# Patient Record
Sex: Male | Born: 1990 | Race: White | Hispanic: No | Marital: Single | Smoking: Current every day smoker
Health system: Southern US, Community
[De-identification: ages and names within clinical notes are randomized; demographics above are authoritative.]

## PROBLEM LIST (undated history)

## (undated) HISTORY — PX: WRIST SURGERY: SHX841

---
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
## 2010-02-01 ENCOUNTER — Encounter: Payer: Self-pay | Admitting: Family Medicine

## 2010-05-09 ENCOUNTER — Emergency Department (HOSPITAL_COMMUNITY): Payer: Worker's Compensation

## 2010-05-09 ENCOUNTER — Emergency Department (HOSPITAL_COMMUNITY)
Admission: EM | Admit: 2010-05-09 | Discharge: 2010-05-09 | Disposition: A | Discharge status: Home / Self Care | Payer: Worker's Compensation | Attending: Emergency Medicine | Admitting: Emergency Medicine

## 2010-05-09 DIAGNOSIS — S90129A Contusion of unspecified lesser toe(s) without damage to nail, initial encounter: Secondary | ICD-10-CM | POA: Insufficient documentation

## 2010-05-09 DIAGNOSIS — Y9229 Other specified public building as the place of occurrence of the external cause: Secondary | ICD-10-CM | POA: Insufficient documentation

## 2010-05-09 DIAGNOSIS — W208XXA Other cause of strike by thrown, projected or falling object, initial encounter: Secondary | ICD-10-CM | POA: Insufficient documentation

## 2010-05-09 DIAGNOSIS — Y990 Civilian activity done for income or pay: Secondary | ICD-10-CM | POA: Insufficient documentation

## 2010-05-29 NOTE — H&P (Signed)
[facility]  Patient:    BOSTEN, NEWSTROM. Visit Number: [id number] MRN: [id number]          Service Type: Dictated by:   Candace Cruise, P.A.-C. Adm. Date:  10/24/00                           History and Physical  CHIEF COMPLAINT:  Fractured right distal radius and ulna.  HISTORY OF PRESENT ILLNESS:  The patient is a 20 year old white male who is to be admitted to day surgery on October 24, 2000, to undergo closed reduction and manipulation right distal radial fracture by Dr. Romeo Apple.  He was initially seen in the emergency room on October 20, 2000, by Dr. Brooke Dare. He injured his right upper extremity.  Stated he was playing football, fell, and another player fell on his right wrist.  In doing so he suffered a displaced right distal radial and ulna fracture.  While in the emergency room closed reduction and manipulation was attempted under hematoma block, but this was unsuccessful.  Post-reduction x-rays show approximately a 30 degree angulation dorsally of the fracture.  At that time the mother was told the child would need to undergo closed reduction and manipulation of the fracture under anesthesia.  Because Dr. Brooke Dare is going to be out of town, it was recommended that Fuller Canada, his associate, do the procedure.  While in the office today, October 21, 2000, neurovascular was noted to be intact to his right hand and cast was in good position.  Surgery was again explained to the mother and followup arrangements also explained.  PAST MEDICAL HISTORY:  The patient has been in excellent health and denied any diabetes mellitus, hypertension, stroke, cardiac, or respiratory problems.  OPERATIONS:  None.  MEDICATIONS:  Tylenol No. 3 1-2 tablets q.4h. p.r.n. pain.  ALLERGIES:  No known drug allergies.  LOCAL PHYSICIAN:  [facility].  SOCIAL HISTORY:  The patient is a Consulting civil engineer.  He does not use alcohol or tobacco products.  REVIEW OF  SYSTEMS:  Negative.  PHYSICAL EXAMINATION:  VITAL SIGNS:  Temperature 98.6, respirations 18, pulse 86, and blood pressure 120/76.  GENERAL:  The patient is 5 feet 6 inches, weighs 101 pounds.  The patient is a think, alert, white male with a cast on his right upper extremity.  HEENT:  Within normal limits.  NECK:  Supple without thyromegaly or masses palpated.  LUNGS:  Clear to auscultation and percussion.  HEART:  Regular rhythm without murmur, no cardiomegaly.  ABDOMEN:  Flat, soft, nontender, and no organomegaly or masses palpated. Hypoactive bowel sounds auscultated.  EXTREMITIES:  Right upper extremity with swelling to the fingers, stiffness. Able, however, to fully extend his fingers.  Pulses intact.  Neurovascularly intact to the right dominant hand.  Other extremity is within normal limits, neurovascularly intact.  SKIN:  Intact.  NEUROLOGIC:  CNS intact.  IMPRESSION:  Displaced right distal radial fracture, failed closed reduction and manipulation under hematoma block, right distal radial fracture.  PLAN:  The patient is to be admitted to day surgery on October 24, 2000, to undergo closed reduction and manipulation of right distal radial fracture and application of cast.  The patient in the meantime has been asked to continue ice, elevation to his extremity.  Labs pending.Dictated by:   Candace Cruise, P.A.-C. DD:  10/21/00 TD:  10/21/00 Job: [id number]

## 2010-05-29 NOTE — Consult Note (Signed)
[facility]  Patient:    Johnathan Salinas, Johnathan Salinas Visit Number: [id number] MRN: [id number]          Service Type: EMS Location: ED Attending Physician:  Annamarie Dawley Admit Date:  10/20/2000 Discharge Date: 10/20/2000                            Consultation Report  HISTORY OF PRESENT ILLNESS:  The patient is a 20 year old white male playing organized league football.  He was tackled, fell, and hit his right wrist. Obvious deformity of the right wrist with pain.  X-ray revealed displaced fracture of the distal radius on the right.  No other injuries were sustained.  The patient is in full football attire including shoulder pads, cleats, and uniform.  No other pain.  The patient had an obvious silver fork deformity of his right distal radius.  Neurovascular was intact.  IMPRESSION:  Fracture displaced right distal radius.  Underwent 1% plain Xylocaine block after sterile prep and drape, right hip and tunnel block given.  Closed reduction was carried out and sugar tong splint applied.  We are awaiting the x-rays to be taken now.  The patient was given prescription for Tylenol No.3 with Codeine, sling, ice, and instructions to see me tomorrow at the office.  If any difficulty come back to the emergency room.  The patient information booklet has been given. Attending Physician:  Annamarie Dawley DD:  10/20/00 TD:  10/21/00 Job: [id number]

## 2010-05-29 NOTE — Op Note (Signed)
[facility]  Patient:    Johnathan Salinas, Johnathan Salinas Visit Number: [id number] MRN: [id number]          Service Type: DSU Location: DAY Attending Physician:  Fuller Canada Dictated by:   Fuller Canada, M.D. Admit Date:  10/24/2000                             Operative Report  INDICATIONS:  [age]-year-old male fractured his right distal radius.  Closed manipulation attempted in the emergency room without complete reduction of the fracture by Dr. Darreld Mclean.  Dr. Hilda Lias then transferred the patient to me in his absence for closed reduction under anesthesia.  PREOPERATIVE DIAGNOSIS:  Distal radius fracture, right wrist.  POSTOPERATIVE DIAGNOSIS:  Distal radius fracture, right wrist.  PROCEDURE:  Closed reduction, right distal radius, application of long-arm sugar-tong splint, right forearm.  SURGEON:  Fuller Canada, M.D.  ASSISTANT:  No assistants.  ANESTHESIA:  General.  ANESTHETIST:  ______ C.R.N.A.  DESCRIPTION OF PROCEDURE:  The patients right wrist was marked as the operative site.  Patient was taken to the operating room for general anesthesia.  After a brief period of traction, closed manipulation was performed in a standard manner and a radiograph showed that the fracture had been placed in acceptable alignment on AP and lateral x-ray.  Patient was placed in a sugar-tong splint, extubated and taken to the recovery room in stable condition.  FOLLOWUP:  Followup appointment was scheduled for two days for x-rays and cast check and patient is to take Tylenol with codeine elixir for pain one teaspoon every four hours as needed. Dictated by:   Fuller Canada, M.D. Attending Physician:  Fuller Canada DD:  10/24/00 TD:  10/24/00 Job: [id number]

## 2010-10-26 LAB — RAPID URINE DRUG SCREEN, HOSP PERFORMED
Cocaine: NOT DETECTED
Tetrahydrocannabinol: POSITIVE — AB

## 2012-08-06 ENCOUNTER — Emergency Department (HOSPITAL_COMMUNITY)
Admission: EM | Admit: 2012-08-06 | Discharge: 2012-08-06 | Disposition: A | Discharge status: Home / Self Care | Payer: Self-pay | Attending: Emergency Medicine | Admitting: Emergency Medicine

## 2012-08-06 ENCOUNTER — Encounter (HOSPITAL_COMMUNITY): Payer: Self-pay | Admitting: Emergency Medicine

## 2012-08-06 DIAGNOSIS — K089 Disorder of teeth and supporting structures, unspecified: Secondary | ICD-10-CM | POA: Insufficient documentation

## 2012-08-06 DIAGNOSIS — K047 Periapical abscess without sinus: Secondary | ICD-10-CM | POA: Insufficient documentation

## 2012-08-06 DIAGNOSIS — R599 Enlarged lymph nodes, unspecified: Secondary | ICD-10-CM | POA: Insufficient documentation

## 2012-08-06 MED ORDER — NAPROXEN 500 MG PO TABS: 500.0000 mg | ORAL_TABLET | Freq: Two times a day (BID) | ORAL | Status: AC

## 2012-08-06 MED ORDER — HYDROCODONE-ACETAMINOPHEN 5-325 MG PO TABS: 1.0000 | ORAL_TABLET | ORAL | Status: AC | PRN

## 2012-08-06 MED ORDER — AMOXICILLIN 500 MG PO CAPS: 500.0000 mg | ORAL_CAPSULE | Freq: Three times a day (TID) | ORAL | Status: AC

## 2012-08-06 NOTE — ED Provider Notes (Signed)
CSN: [id number]     Arrival date & time 08/06/12  1415 History     First MD Initiated Contact with Patient 08/06/12 1435     Chief Complaint  Patient presents with  . Oral Swelling   (Consider location/radiation/quality/duration/timing/severity/associated sxs/prior Treatment) HPI Johnathan Salinas is a 22 y.o. male who presents to the ED with dental pain. The pain is located in the upper right first molar. The pain started yesterday and is worse today with swelling of the gum around the tooth. Patient denies fever, chills, nausea, vomiting or other problems.  History reviewed. No pertinent past medical history. History reviewed. No pertinent past surgical history. No family history on file. History  Substance Use Topics  . Smoking status: Not on file  . Smokeless tobacco: Not on file  . Alcohol Use: Not on file    Review of Systems  Constitutional: Negative for fever and chills.  HENT: Positive for dental problem. Negative for neck pain.   Respiratory: Negative for cough.   Gastrointestinal: Negative for nausea and vomiting.  Skin: Negative for rash.  Neurological: Negative for headaches.  Psychiatric/Behavioral: The patient is not nervous/anxious.     Allergies  Review of patient's allergies indicates no known allergies.  Home Medications   Current Outpatient Rx  Name  Route  Sig  Dispense  Refill  . amoxicillin (AMOXIL) 500 MG capsule   Oral   Take 1 capsule (500 mg total) by mouth 3 (three) times daily.   21 capsule   0   . HYDROcodone-acetaminophen (NORCO/VICODIN) 5-325 MG per tablet   Oral   Take 1 tablet by mouth every 4 (four) hours as needed.   15 tablet   0   . naproxen (NAPROSYN) 500 MG tablet   Oral   Take 1 tablet (500 mg total) by mouth 2 (two) times daily.   30 tablet   0    BP 108/61  Pulse 73  Temp(Src) 98.7 F (37.1 C) (Oral)  Resp 16  Wt 145 lb (65.772 kg)  SpO2 100% Physical Exam  Nursing note and vitals reviewed. Constitutional: He  is oriented to person, place, and time. He appears well-developed and well-nourished. No distress.  HENT:  Head: Normocephalic.  Mouth/Throat: Uvula is midline, oropharynx is clear and moist and mucous membranes are normal. Dental abscesses present.    Swelling and tenderness of gum around the first upper right molar.  Eyes: EOM are normal.  Neck: Neck supple.  Cardiovascular: Normal rate and regular rhythm.   Pulmonary/Chest: Effort normal and breath sounds normal.  Abdominal: Soft. There is no tenderness.  Musculoskeletal: Normal range of motion.  Lymphadenopathy:    He has cervical adenopathy (right).  Neurological: He is alert and oriented to person, place, and time. No cranial nerve deficit.  Skin: Skin is warm and dry.  Psychiatric: He has a normal mood and affect. His behavior is normal.    ED Course   Procedures  1. Dental abscess     MDM  22 y.o. male with dental pain due to abscess. Will give information on dental clinic and treat with antibiotics and pain medication. He will return as needed.    Medication List         amoxicillin 500 MG capsule  Commonly known as:  AMOXIL  Take 1 capsule (500 mg total) by mouth 3 (three) times daily.     HYDROcodone-acetaminophen 5-325 MG per tablet  Commonly known as:  NORCO/VICODIN  Take 1 tablet by mouth  every 4 (four) hours as needed.     naproxen 500 MG tablet  Commonly known as:  NAPROSYN  Take 1 tablet (500 mg total) by mouth 2 (two) times daily.         [facility] Orlene Och, [place] 08/06/12 1443

## 2012-08-06 NOTE — ED Notes (Signed)
States that he chipped a tooth a while back and noticed that he started having swelling in his upper right gum last night, which was worse today.

## 2012-08-06 NOTE — ED Provider Notes (Signed)
Medical screening examination/treatment/procedure(s) were performed by non-physician practitioner and as supervising physician I was immediately available for consultation/collaboration.   Kalid Ghan, MD 08/06/12 1454 

## 2015-05-28 ENCOUNTER — Encounter (HOSPITAL_BASED_OUTPATIENT_CLINIC_OR_DEPARTMENT_OTHER): Payer: Self-pay

## 2015-05-28 ENCOUNTER — Emergency Department (HOSPITAL_BASED_OUTPATIENT_CLINIC_OR_DEPARTMENT_OTHER): Payer: Worker's Compensation

## 2015-05-28 ENCOUNTER — Emergency Department (HOSPITAL_BASED_OUTPATIENT_CLINIC_OR_DEPARTMENT_OTHER)
Admission: EM | Admit: 2015-05-28 | Discharge: 2015-05-28 | Disposition: A | Discharge status: Home / Self Care | Payer: Worker's Compensation | Attending: Emergency Medicine | Admitting: Emergency Medicine

## 2015-05-28 DIAGNOSIS — W228XXA Striking against or struck by other objects, initial encounter: Secondary | ICD-10-CM | POA: Diagnosis not present

## 2015-05-28 DIAGNOSIS — Y929 Unspecified place or not applicable: Secondary | ICD-10-CM | POA: Insufficient documentation

## 2015-05-28 DIAGNOSIS — F17200 Nicotine dependence, unspecified, uncomplicated: Secondary | ICD-10-CM | POA: Insufficient documentation

## 2015-05-28 DIAGNOSIS — Y999 Unspecified external cause status: Secondary | ICD-10-CM | POA: Insufficient documentation

## 2015-05-28 DIAGNOSIS — S0990XA Unspecified injury of head, initial encounter: Secondary | ICD-10-CM

## 2015-05-28 DIAGNOSIS — Y9389 Activity, other specified: Secondary | ICD-10-CM | POA: Diagnosis not present

## 2015-05-28 MED ORDER — IBUPROFEN 800 MG PO TABS: 800.0000 mg | ORAL_TABLET | Freq: Three times a day (TID) | ORAL | Status: AC

## 2015-05-28 NOTE — ED Notes (Signed)
Pt states hit in the head at work with a pallet, positive LOC for 2-3 secs.

## 2015-05-28 NOTE — ED Provider Notes (Signed)
CSN: [id number]     Arrival date & time 05/28/15  0141 History   First MD Initiated Contact with Patient 05/28/15 0327     Chief Complaint  Patient presents with  . Head Injury     (Consider location/radiation/quality/duration/timing/severity/associated sxs/prior Treatment) Patient is a 25 y.o. male presenting with head injury. The history is provided by the patient.  Head Injury Location:  Frontal Mechanism of injury comment:  Pallet hit patient in head Pain details:    Quality:  Aching   Severity:  Moderate   Timing:  Constant   Progression:  Unchanged Chronicity:  New Relieved by:  Nothing Worsened by:  Nothing tried Ineffective treatments:  None tried Associated symptoms: loss of consciousness   Associated symptoms: no blurred vision, no difficulty breathing, no disorientation, no focal weakness, no memory loss, no nausea, no neck pain, no numbness, no seizures and no vomiting   Associated symptoms comment:  Seconds of LOC Risk factors: no alcohol use     History reviewed. No pertinent past medical history. History reviewed. No pertinent past surgical history. History reviewed. No pertinent family history. Social History  Substance Use Topics  . Smoking status: Current Some Day Smoker  . Smokeless tobacco: None  . Alcohol Use: No    Review of Systems  Eyes: Negative for blurred vision and photophobia.  Gastrointestinal: Negative for nausea and vomiting.  Musculoskeletal: Negative for neck pain.  Neurological: Positive for loss of consciousness. Negative for dizziness, tremors, focal weakness, seizures, syncope, facial asymmetry, weakness and numbness.  Psychiatric/Behavioral: Negative for memory loss.  All other systems reviewed and are negative.     Allergies  Review of patient's allergies indicates no known allergies.  Home Medications   Prior to Admission medications   Medication Sig Start Date End Date Taking? Authorizing Provider  amoxicillin (AMOXIL)  500 MG capsule Take 1 capsule (500 mg total) by mouth 3 (three) times daily. 08/06/12   Hope Orlene Och, NP  HYDROcodone-acetaminophen (NORCO/VICODIN) 5-325 MG per tablet Take 1 tablet by mouth every 4 (four) hours as needed. 08/06/12   Hope Orlene Och, NP  ibuprofen (ADVIL,MOTRIN) 800 MG tablet Take 1 tablet (800 mg total) by mouth 3 (three) times daily. 05/28/15   Esraa Seres, MD  naproxen (NAPROSYN) 500 MG tablet Take 1 tablet (500 mg total) by mouth 2 (two) times daily. 08/06/12   Hope Orlene Och, NP   BP 133/79 mmHg  Pulse 72  Temp(Src) 98.8 F (37.1 C) (Oral)  Resp 18  Ht  (1.803 m)  Wt 140 lb (63.504 kg)  BMI 19.53 kg/m2  SpO2 100% Physical Exam  Constitutional: He is oriented to person, place, and time. He appears well-developed and well-nourished. No distress.  HENT:  Head: Normocephalic and atraumatic. Head is without raccoon's eyes and without Battle's sign.  Right Ear: No mastoid tenderness. No hemotympanum.  Left Ear: No mastoid tenderness. No hemotympanum.  Mouth/Throat: Oropharynx is clear and moist.  Eyes: Conjunctivae are normal. Pupils are equal, round, and reactive to light.  Neck: Normal range of motion. Neck supple.  Cardiovascular: Normal rate, regular rhythm and intact distal pulses.   Pulmonary/Chest: Effort normal and breath sounds normal. No respiratory distress. He has no wheezes. He has no rales.  Abdominal: Soft. Bowel sounds are normal. There is no tenderness. There is no rebound and no guarding.  Musculoskeletal: Normal range of motion. He exhibits no edema or tenderness.       Cervical back: Normal.  Thoracic back: Normal.       Lumbar back: Normal.  Neurological: He is alert and oriented to person, place, and time. He has normal reflexes. He displays normal reflexes. No cranial nerve deficit. Coordination normal.  Skin: Skin is warm and dry.  Psychiatric: He has a normal mood and affect.    ED Course  Procedures (including critical care time) Labs  Review Labs Reviewed - No data to display  Imaging Review Ct Head Wo Contrast  05/28/2015  CLINICAL DATA:  Initial evaluation for acute trauma. EXAM: CT HEAD WITHOUT CONTRAST TECHNIQUE: Contiguous axial images were obtained from the base of the skull through the vertex without intravenous contrast. COMPARISON:  None. FINDINGS: There is no acute intracranial hemorrhage or infarct. No mass lesion or midline shift. Gray-white matter differentiation is well maintained. Ventricles are normal in size without evidence of hydrocephalus. CSF containing spaces are within normal limits. No extra-axial fluid collection. The calvarium is intact. Orbital soft tissues are within normal limits. The paranasal sinuses and mastoid air cells are well pneumatized and free of fluid. Scalp soft tissues are unremarkable. IMPRESSION: Normal head CT.  No acute intracranial process identified. Electronically Signed   By: Rise MuBenjamin  McClintock M.D.   On: 05/28/2015 03:55   I have personally reviewed and evaluated these images and lab results as part of my medical decision-making.   EKG Interpretation None      MDM   Final diagnoses:  Minor head injury, initial encounter    Filed Vitals:   05/28/15 0335  BP: 133/79  Pulse: 72  Temp: 98.8 F (37.1 C)  Resp: 18   Results for orders placed or performed during the hospital encounter of 08/18/06  Urine rapid drug screen (hosp performed)  Result Value Ref Range   Opiates NONE DETECTED    Cocaine NONE DETECTED    Benzodiazepines NONE DETECTED    Amphetamines NONE DETECTED    Tetrahydrocannabinol POSITIVE (A)    Barbiturates      NONE DETECTED        DRUG SCREEN FOR MEDICAL PURPOSES ONLY.  IF CONFIRMATION IS NEEDED FOR ANY PURPOSE, NOTIFY LAB WITHIN 5 DAYS.   Ct Head Wo Contrast  05/28/2015  CLINICAL DATA:  Initial evaluation for acute trauma. EXAM: CT HEAD WITHOUT CONTRAST TECHNIQUE: Contiguous axial images were obtained from the base of the skull through the  vertex without intravenous contrast. COMPARISON:  None. FINDINGS: There is no acute intracranial hemorrhage or infarct. No mass lesion or midline shift. Gray-white matter differentiation is well maintained. Ventricles are normal in size without evidence of hydrocephalus. CSF containing spaces are within normal limits. No extra-axial fluid collection. The calvarium is intact. Orbital soft tissues are within normal limits. The paranasal sinuses and mastoid air cells are well pneumatized and free of fluid. Scalp soft tissues are unremarkable. IMPRESSION: Normal head CT.  No acute intracranial process identified. Electronically Signed   By: Rise MuBenjamin  McClintock M.D.   On: 05/28/2015 03:55    Will d/c on tylenol and ibuprofen.  Lots of liquids, limit screen time no contact sports x 7 days.  Strict return precautions given   Dionysios Massman, MD 05/28/15 [id number]

## 2015-05-28 NOTE — ED Notes (Signed)
See downtime form further charting

## 2016-04-17 MED ORDER — SODIUM CHLORIDE 0.9 % IV SOLP
1000 mL | INTRAVENOUS | 0 refills | Status: CP
Start: 2016-04-17 — End: ?

## 2016-04-17 MED ORDER — HYOSCYAMINE SULFATE 0.125 MG SL SUBL
125 ug | ORAL_TABLET | SUBLINGUAL | 0 refills | Status: DC | PRN
Start: 2016-04-17 — End: 2016-05-04

## 2016-04-17 MED ORDER — LIDOCAINE HCL 2 % MM JELP
TOPICAL | 0 refills | Status: DC | PRN
Start: 2016-04-17 — End: 2016-04-18

## 2016-04-23 MED ORDER — HYOSCYAMINE SULFATE 0.125 MG PO TAB
125 ug | ORAL_TABLET | ORAL | 0 refills | Status: DC | PRN
Start: 2016-04-23 — End: 2016-05-04

## 2016-04-23 MED ORDER — OXYBUTYNIN CHLORIDE 5 MG PO TAB
5 mg | ORAL_TABLET | Freq: Three times a day (TID) | ORAL | 3 refills | 12.00000 days | Status: DC
Start: 2016-04-23 — End: 2016-10-17

## 2016-04-30 MED ORDER — ONDANSETRON HCL (PF) 4 MG/2 ML IJ SOLN
4 mg | Freq: Once | INTRAVENOUS | 0 refills | Status: CP
Start: 2016-04-30 — End: ?

## 2016-04-30 MED ORDER — LACTATED RINGERS IV SOLP
1000 mL | INTRAVENOUS | 0 refills | Status: CP
Start: 2016-04-30 — End: ?

## 2016-04-30 MED ORDER — LACTOBACILLUS ACIDOPHILUS 460 MG (20 BILLION CELL) PO CAP
1 | ORAL_CAPSULE | Freq: Every day | ORAL | 0 refills | 30.00000 days | Status: AC
Start: 2016-04-30 — End: ?

## 2016-05-13 MED ORDER — ACETAMINOPHEN 500 MG PO TAB
1000 mg | Freq: Once | ORAL | 0 refills | Status: CP
Start: 2016-05-13 — End: ?

## 2016-05-13 MED ORDER — ONDANSETRON 8 MG PO TBDI
8 mg | Freq: Once | ORAL | 0 refills | Status: CP
Start: 2016-05-13 — End: ?

## 2016-05-24 MED ORDER — DEXTROAMPHETAMINE-AMPHETAMINE 5 MG PO TAB
5 mg | ORAL_TABLET | Freq: Two times a day (BID) | ORAL | 0 refills | Status: DC
Start: 2016-05-24 — End: 2016-07-08

## 2016-06-20 ENCOUNTER — Emergency Department (HOSPITAL_COMMUNITY)
Admission: EM | Admit: 2016-06-20 | Discharge: 2016-06-21 | Disposition: A | Discharge status: Home / Self Care | Payer: Managed Care, Other (non HMO) | Attending: Physician Assistant | Admitting: Physician Assistant

## 2016-06-20 ENCOUNTER — Encounter (HOSPITAL_COMMUNITY): Payer: Self-pay | Admitting: Emergency Medicine

## 2016-06-20 ENCOUNTER — Emergency Department (HOSPITAL_COMMUNITY): Payer: Managed Care, Other (non HMO)

## 2016-06-20 DIAGNOSIS — F17200 Nicotine dependence, unspecified, uncomplicated: Secondary | ICD-10-CM | POA: Insufficient documentation

## 2016-06-20 DIAGNOSIS — G43909 Migraine, unspecified, not intractable, without status migrainosus: Secondary | ICD-10-CM

## 2016-06-20 DIAGNOSIS — H53149 Visual discomfort, unspecified: Secondary | ICD-10-CM | POA: Insufficient documentation

## 2016-06-20 DIAGNOSIS — G43109 Migraine with aura, not intractable, without status migrainosus: Secondary | ICD-10-CM

## 2016-06-20 LAB — COMPREHENSIVE METABOLIC PANEL
ALK PHOS: 63 U/L (ref 38–126)
ALT: 14 U/L — AB (ref 17–63)
AST: 28 U/L (ref 15–41)
Albumin: 4.4 g/dL (ref 3.5–5.0)
Anion gap: 8 (ref 5–15)
BUN: 14 mg/dL (ref 6–20)
CALCIUM: 9.3 mg/dL (ref 8.9–10.3)
CHLORIDE: 104 mmol/L (ref 101–111)
CO2: 26 mmol/L (ref 22–32)
CREATININE: 1.07 mg/dL (ref 0.61–1.24)
GFR calc Af Amer: >60 mL/min (ref >60)
GFR calc non Af Amer: >60 mL/min (ref >60)
Glucose, Bld: 98 mg/dL (ref 65–99)
Potassium: 3.8 mmol/L (ref 3.5–5.1)
SODIUM: 138 mmol/L (ref 135–145)
Total Bilirubin: 0.9 mg/dL (ref 0.3–1.2)
Total Protein: 6.8 g/dL (ref 6.5–8.1)

## 2016-06-20 LAB — CBG MONITORING, ED: Glucose-Capillary: 95 mg/dL (ref 65–99)

## 2016-06-20 LAB — DIFFERENTIAL
BASOS ABS: 0.1 10*3/uL (ref 0.0–0.1)
BASOS PCT: 1 %
Eosinophils Absolute: 0.2 10*3/uL (ref 0.0–0.7)
Eosinophils Relative: 2 %
LYMPHS PCT: 32 %
Lymphs Abs: 2.7 10*3/uL (ref 0.7–4.0)
MONO ABS: 0.3 10*3/uL (ref 0.1–1.0)
MONOS PCT: 3 %
NEUTROS ABS: 5.3 10*3/uL (ref 1.7–7.7)
Neutrophils Relative %: 62 %

## 2016-06-20 LAB — CBC
HEMATOCRIT: 43.1 % (ref 39.0–52.0)
Hemoglobin: 14.6 g/dL (ref 13.0–17.0)
MCH: 30.5 pg (ref 26.0–34.0)
MCHC: 33.9 g/dL (ref 30.0–36.0)
MCV: 90 fL (ref 78.0–100.0)
PLATELETS: 262 10*3/uL (ref 150–400)
RBC: 4.79 MIL/uL (ref 4.22–5.81)
RDW: 13.1 % (ref 11.5–15.5)
WBC: 8.5 10*3/uL (ref 4.0–10.5)

## 2016-06-20 LAB — I-STAT CHEM 8, ED
BUN: 16 mg/dL (ref 6–20)
CHLORIDE: 104 mmol/L (ref 101–111)
Calcium, Ion: 1.07 mmol/L — ABNORMAL LOW (ref 1.15–1.40)
Creatinine, Ser: 1.1 mg/dL (ref 0.61–1.24)
Glucose, Bld: 97 mg/dL (ref 65–99)
HEMATOCRIT: 43 % (ref 39.0–52.0)
Hemoglobin: 14.6 g/dL (ref 13.0–17.0)
POTASSIUM: 3.7 mmol/L (ref 3.5–5.1)
SODIUM: 141 mmol/L (ref 135–145)
TCO2: 27 mmol/L (ref 0–100)

## 2016-06-20 LAB — PROTIME-INR
INR: 1.01
Prothrombin Time: 13.3 seconds (ref 11.4–15.2)

## 2016-06-20 LAB — I-STAT TROPONIN, ED: Troponin i, poc: 0 ng/mL (ref 0.00–0.08)

## 2016-06-20 LAB — APTT: APTT: 27 s (ref 24–36)

## 2016-06-20 MED ORDER — DIPHENHYDRAMINE HCL 50 MG/ML IJ SOLN
12.5000 mg | Freq: Once | INTRAMUSCULAR | Status: AC
  Administered 2016-06-20: 12.5 mg via INTRAVENOUS
  Filled 2016-06-20: qty 1

## 2016-06-20 MED ORDER — KETOROLAC TROMETHAMINE 30 MG/ML IJ SOLN
30.0000 mg | Freq: Once | INTRAMUSCULAR | Status: AC
  Administered 2016-06-20: 30 mg via INTRAVENOUS
  Filled 2016-06-20: qty 1

## 2016-06-20 MED ORDER — SODIUM CHLORIDE 0.9 % IV SOLN
Freq: Once | INTRAVENOUS | Status: AC
  Administered 2016-06-20: via INTRAVENOUS

## 2016-06-20 MED ORDER — PROCHLORPERAZINE EDISYLATE 5 MG/ML IJ SOLN
10.0000 mg | Freq: Once | INTRAMUSCULAR | Status: AC
  Administered 2016-06-20: 10 mg via INTRAVENOUS
  Filled 2016-06-20: qty 2

## 2016-06-20 NOTE — ED Notes (Signed)
Pt's CBG result was 95. Informed Woody - RN.

## 2016-06-20 NOTE — ED Triage Notes (Signed)
Patient is from work, stated that he was walking [place] the [place] [employer], felt a sudden onset of right sided vision changes, right cheek numbness and tingling.  That has resolved, still having some blurry vision and continues with the head pain.  CBG of 98 by EMS, was given 4mg  ODT Zofran.  No slurred speech, no facial droop.  Code Stroke cancelled at the bridge by Dr Amada JupiterKirkpatrick.

## 2016-06-20 NOTE — Consult Note (Signed)
Neurology Consultation Reason for Consult: Vision change Referring Physician: Rhunette CroftNanavati, A  CC: Vision change  History is obtained from: Patient  HPI: Johnathan Salinas is a 26 y.o. male was in his normal state of health earlier tonight while at work. Around 9:30, he began experiencing vision change consisting of a line in his vision. He subsequently had progressive geometrical patterns followed by spots of light that were undulating. This obscured the right vision. Subsequently he began having paresthesia this started in his right face and gradually spread down to involve his right arm. All of these symptoms have since resolved but he is having a throbbing 10 out of 10 right sided retro-orbital headache.  On arrival to the ED, with this characteristic history code stroke was canceled.  On further questioning, he has had a similar episode in the past when he was about 12 or 13 with vision change very similar to what happened today. He did not seek care at that time. He has occasional headaches with photophobia and the cause and lay down in a dark room.  ROS: A 14 point ROS was performed and is negative except as noted in the HPI.   History reviewed. No pertinent past medical history.   Family history: Mother-migraines  Social History:  reports that he has been smoking.  He has never used smokeless tobacco. He reports that he does not drink alcohol or use drugs.   Exam: Current vital signs: Ht 5\' 11"  (1.803 m)   Wt 61.2 kg (135 lb)   BMI 18.83 kg/m  Vital signs in last 24 hours: Weight:  [61.2 kg (135 lb)] 61.2 kg (135 lb) (06/10 2305)   Physical Exam  Constitutional: Appears well-developed and well-nourished.  Psych: Affect appropriate to situation Eyes: No scleral injection HENT: No OP obstrucion Head: Normocephalic.  Cardiovascular: Normal rate and regular rhythm.  Respiratory: Effort normal and breath sounds normal to anterior ascultation GI: Soft.  No distension. There is  no tenderness.  Skin: WDI  Neuro: Mental Status: Patient is awake, alert, oriented to person, place, month, year, and situation. Patient is able to give a clear and coherent history. No signs of aphasia or neglect Cranial Nerves: II: Visual Fields are full. Pupils are equal, round, and reactive to light.   III,IV, VI: EOMI without ptosis or diploplia.  V: Facial sensation is symmetric to temperature VII: Facial movement is symmetric.  VIII: hearing is intact to voice X: Uvula elevates symmetrically XI: Shoulder shrug is symmetric. XII: tongue is midline without atrophy or fasciculations.  Motor: Tone is normal. Bulk is normal. 5/5 strength was present in all four extremities.  Sensory: Sensation is symmetric to light touch and temperature in the arms and legs. Cerebellar: FNF and HKS are intact bilaterally   I have reviewed labs in epic and the results pertinent to this consultation are: Normal creatinine  Impression: 26 year old male with complicated migraine. With characteristic history and previous episode 12 years ago, I don't think that any further imaging or evaluation is needed at this time.  Recommendations: 1) Compazine, Benadryl, Toradol 2) no further recommendations at this time, please call if further questions or concerns.   Ritta SlotMcNeill Darick Fetters, MD [facility] [phone]  If 7pm- 7am, please page neurology on call as listed in AMION.

## 2016-06-21 NOTE — Discharge Instructions (Signed)
Your symptoms and consistent with a migraine. All of your laboratory has been normal. Neck she follow-up with neurology. Return to the ED if he develops any worsening symptoms.

## 2016-06-21 NOTE — ED Provider Notes (Signed)
[facility]EMERGENCY DEPT Provider Note   CSN: [id number] Arrival date & time: 06/20/16  2257     History   Chief Complaint Chief Complaint  Patient presents with  . Migraine    HPI Johnathan Salinas is a 26 y.o. male.  HPI 26 year old Caucasian male past medical history significant for migraines presents to the emergency department today by EMS with complaints of sudden onset of vision changes and gradually worsening headache. Patient states that approximately 9:30 this evening he was walking down the out [employer] and again expressing sudden onset of visit changes but he describes his blurry vision. Patient also endorses the paresthesias to the right side of his face. He reports gradually onset and worsening right-sided headache that he describes as throbbing and describes as a 10 out of 10. Unilateral. Patient denies any slurred speech, facial asymmetry, difficulties walking, difficulty speaking. Patient denies worse headache of his life. Patient states he has felt fine up until 2 this evening. Patient does state that he had a similar episode about 14 years ago with vision changes are similar to what happened this evening. Patient also endorses occasional headaches with photophobia and states that he uses estolate in a dark room to resolve his issues. Patient denies any recent illness. He denies any fever, chills, lightheadedness, dizziness, chest pain, shortness of breath, abdominal pain, nausea, emesis, urinary symptoms, change in bowel habits, lower extremity paresthesias.  Patient states all the symptoms have resolved at this time except for a throbbing headache. History reviewed. No pertinent past medical history.  There are no active problems to display for this patient.   History reviewed. No pertinent surgical history.     Home Medications    Prior to Admission medications   Medication Sig Start Date End Date Taking? Authorizing Provider  amoxicillin (AMOXIL) 500 MG capsule  Take 1 capsule (500 mg total) by mouth 3 (three) times daily. 08/06/12   Janne NapoleonNeese, Hope M, NP  HYDROcodone-acetaminophen (NORCO/VICODIN) 5-325 MG per tablet Take 1 tablet by mouth every 4 (four) hours as needed. 08/06/12   Janne NapoleonNeese, Hope M, NP  ibuprofen (ADVIL,MOTRIN) 800 MG tablet Take 1 tablet (800 mg total) by mouth 3 (three) times daily. 05/28/15   Palumbo, April, MD  naproxen (NAPROSYN) 500 MG tablet Take 1 tablet (500 mg total) by mouth 2 (two) times daily. 08/06/12   Janne NapoleonNeese, Hope M, NP    Family History No family history on file.  Social History Social History  Substance Use Topics  . Smoking status: Current Some Day Smoker  . Smokeless tobacco: Never Used  . Alcohol use No     Allergies   Patient has no known allergies.   Review of Systems Review of Systems  Constitutional: Negative for chills and fever.  HENT: Negative for congestion.   Eyes: Positive for photophobia and visual disturbance. Negative for pain.  Respiratory: Negative for cough and shortness of breath.   Cardiovascular: Negative for chest pain and palpitations.  Gastrointestinal: Negative for abdominal pain, diarrhea, nausea and vomiting.  Genitourinary: Negative for dysuria, flank pain, frequency, genital sores, hematuria and urgency.  Musculoskeletal: Negative for neck pain and neck stiffness.  Neurological: Positive for numbness and headaches. Negative for dizziness, syncope, weakness and light-headedness.     Physical Exam Updated Vital Signs BP 116/79   Pulse (!) 55   Temp 99.1 F (37.3 C) (Oral)   Resp 16   Ht 5\' 11"  (1.803 m)   Wt 61.2 kg (135 lb)   SpO2 97%  BMI 18.83 kg/m   Physical Exam  Constitutional: He is oriented to person, place, and time. He appears well-developed and well-nourished. No distress.  HENT:  Head: Normocephalic and atraumatic.  Mouth/Throat: Oropharynx is clear and moist.  Eyes: Conjunctivae and EOM are normal. Pupils are equal, round, and reactive to light. Right eye  exhibits no discharge. Left eye exhibits no discharge. No scleral icterus.  Neck: Normal range of motion. Neck supple. No thyromegaly present.  Cardiovascular: Normal rate, regular rhythm, normal heart sounds and intact distal pulses.  Exam reveals no gallop and no friction rub.   No murmur heard. Pulmonary/Chest: Effort normal and breath sounds normal.  Abdominal: Soft. Bowel sounds are normal. He exhibits no distension. There is no tenderness. There is no rebound and no guarding.  Musculoskeletal: Normal range of motion.  Lymphadenopathy:    He has no cervical adenopathy.  Neurological: He is alert and oriented to person, place, and time.  The patient is alert, attentive, and oriented x 3. Speech is clear. Cranial nerve II-VII grossly intact. Negative pronator drift. Sensation intact. Strength 5/5 in all extremities. Reflexes 2+ and symmetric at biceps, triceps, knees, and ankles. Rapid alternating movement and fine finger movements intact. Romberg is absent. Posture and gait normal.   Skin: Skin is warm and dry. Capillary refill takes less than 2 seconds.  Nursing note and vitals reviewed.    ED Treatments / Results  Labs (all labs ordered are listed, but only abnormal results are displayed) Labs Reviewed  COMPREHENSIVE METABOLIC PANEL - Abnormal; Notable for the following:       Result Value   ALT 14 (*)    All other components within normal limits  I-STAT CHEM 8, ED - Abnormal; Notable for the following:    Calcium, Ion 1.07 (*)    All other components within normal limits  PROTIME-INR  APTT  CBC  DIFFERENTIAL  I-STAT TROPOININ, ED  CBG MONITORING, ED    EKG  EKG Interpretation None       Radiology No results found.  Procedures Procedures (including critical care time)  Medications Ordered in ED Medications  prochlorperazine (COMPAZINE) injection 10 mg (10 mg Intravenous Given 06/20/16 2347)  diphenhydrAMINE (BENADRYL) injection 12.5 mg (12.5 mg Intravenous  Given 06/20/16 2345)  ketorolac (TORADOL) 30 MG/ML injection 30 mg (30 mg Intravenous Given 06/20/16 2348)  0.9 %  sodium chloride infusion ( Intravenous New Bag/Given 06/20/16 2345)     Initial Impression / Assessment and Plan / ED Course  I have reviewed the triage vital signs and the nursing notes.  Pertinent labs & imaging results that were available during my care of the patient were reviewed by me and considered in my medical decision making (see chart for details).     Patient presents to the emergency Department today with complaints of right sided visual changes and right-sided facial numbness with right-sided throbbing headache. History of migraines with similar in the past. Initial code stroke was called. Patient was evaluated by Dr. Petra Kuba with neurology canceled code stroke. States that patient's symptoms are consistent with complicated migraine. On my exam patient states his symptoms have resolved. Migraine cocktail was ordered by neurology and after my examination patient states all the symptoms have resolved and he stills ready for discharge.No focal neuro deficits on my exam. Neurology does not recommend any imaging at this time. Labs within normal in the ED. Patient feels much improved and ready for discharge. Have given neurology follow-up. Pt is hemodynamically stable, in  NAD, & able to ambulate in the ED. Pain has been managed & has no complaints prior to dc. Pt is comfortable with above plan and is stable for discharge at this time. All questions were answered prior to disposition. Strict return precautions for f/u to the ED were discussed.   Final Clinical Impressions(s) / ED Diagnoses   Final diagnoses:  Migraine without status migrainosus, not intractable, unspecified migraine type    New Prescriptions New Prescriptions   No medications on file     Wallace Keller 06/21/16 0108    Rise Mu, PA-C 06/21/16 0109    Rise Mu,  PA-C 06/21/16 0109    Abelino Derrick, MD 06/21/16 [phone]

## 2016-06-28 ENCOUNTER — Encounter: Admit: 2016-06-28 | Discharge: 2016-06-28 | Payer: MEDICARE

## 2016-06-28 ENCOUNTER — Ambulatory Visit: Admit: 2016-06-28 | Discharge: 2016-06-28 | Payer: MEDICARE

## 2016-06-28 DIAGNOSIS — N211 Calculus in urethra: ICD-10-CM

## 2016-06-28 DIAGNOSIS — R339 Retention of urine, unspecified: Principal | ICD-10-CM

## 2016-06-28 DIAGNOSIS — T85618A Breakdown (mechanical) of other specified internal prosthetic devices, implants and grafts, initial encounter: ICD-10-CM

## 2016-06-28 DIAGNOSIS — N390 Urinary tract infection, site not specified: ICD-10-CM

## 2016-06-28 DIAGNOSIS — Q0700 Arnold-Chiari syndrome without spina bifida or hydrocephalus: ICD-10-CM

## 2016-06-28 DIAGNOSIS — Q059 Spina bifida, unspecified: ICD-10-CM

## 2016-06-28 DIAGNOSIS — N200 Calculus of kidney: ICD-10-CM

## 2016-06-28 DIAGNOSIS — R569 Unspecified convulsions: ICD-10-CM

## 2016-06-28 DIAGNOSIS — Q052 Lumbar spina bifida with hydrocephalus: ICD-10-CM

## 2016-06-28 DIAGNOSIS — R51 Headache: ICD-10-CM

## 2016-06-28 DIAGNOSIS — N319 Neuromuscular dysfunction of bladder, unspecified: ICD-10-CM

## 2016-06-28 DIAGNOSIS — G919 Hydrocephalus, unspecified: ICD-10-CM

## 2016-06-28 NOTE — Progress Notes
Date of Service: 06/28/2016     Subjective:             Allen Gill is a 26 y.o. male with spina bifida and neurogenic bladder.    History of Present Illness  25yo M with neurogenic bladder secondary to spina bifida.  He managed his bladder with intermittent catheterization for a long time, but unfortunately developed severe prostatic stones.  He had a laser unroofing of the prostate with stone removal, but unfortunately subsequently had a high bladder neck and difficulty with catheterizing.  He had urodynamics demonstrating a small capacity bladder with continuous leaking.    He is anxious about urinary diversion.  He is highly concerned with the risk for reoperation and future stones.    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    UDS: capacity 70cc, no sensation, continuous leak throughout exam       Review of Systems   Constitutional: Negative for activity change, appetite change, chills, diaphoresis, fatigue, fever and unexpected weight change.   HENT: Negative for congestion, hearing loss, mouth sores and sinus pressure.    Eyes: Negative for visual disturbance.   Respiratory: Negative for apnea, cough, chest tightness and shortness of breath.    Cardiovascular: Negative for chest pain, palpitations and leg swelling.   Gastrointestinal: Negative for abdominal distention, abdominal pain, anal bleeding, blood in stool, constipation, diarrhea, nausea, rectal pain and vomiting.   Genitourinary: Negative for decreased urine volume, difficulty urinating, discharge, dysuria, enuresis, flank pain, frequency, genital sores, hematuria, penile pain, penile swelling, scrotal swelling, testicular pain and urgency.   Musculoskeletal: Negative for arthralgias, back pain, gait problem and myalgias.   Skin: Negative for rash and wound.   Neurological: Negative for dizziness, tremors, seizures, syncope, weakness, light-headedness, numbness and headaches.   Hematological: Negative for adenopathy. Does not bruise/bleed easily. Psychiatric/Behavioral: Negative for decreased concentration and dysphoric mood. The patient is not nervous/anxious.        Past Medical History:   Diagnosis Date   ??? Arnold-Chiari malformation (HCC)    ??? Headache(784.0)    ??? Hydrocephalus    ??? Infection of VP shunt    ??? Kidney stones    ??? Myelomeningocele (HCC)    ??? Neurogenic bladder    ??? 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 Shunt malfunction    ??? Spina bifida (HCC)    ??? Urinary tract infection      Past Surgical History:   Procedure Laterality Date   ??? HX ABDOMEN SURGERY  01/2001    Cecostomy   ??? SHUNT REVISION  October 2010    [place]   ??? SHUNT REVISION  12/11/09    replacment of valve to acodman hakem adjustablevalve   ??? CATHETER IMPLANT/REVISION  12/27/09    distal end of the catheter was revised   ??? CATHETER IMPLANT/REVISION  01/31/10    replacement of ventricular catheter with [employer] framelessstereotaxis catheter   ??? VENTRICULOSTOMY  02/03/10    removal of all shunt components and placementofright frontal ventriculostomy   ??? SHUNT REVISION  02/17/10    left frontal ventriculopleural shunt   ??? PR CRTJ SHUNT VENTRICULO-PERITNEAL-PLEURAL TERMINUS Right 06/14/2014    SHUNT CREATION VENTRICULAR PERITONEAL performed by [facility]   ??? SHUNT REVISION Right 04/23/2015    SHUNT REVISION VENTRICULAR PERITONEAL performed by [facility], [facility]   ??? HARDWARE REMOVAL Left 05/20/2015    REMOVAL HARDWARE HEAD: removal  of left ventriculopleural shunt performed by [facility]ould, [facility]   ??? PR RMVL COMPL CSF SHUNT SYSTEM W/O RPLCMT SHUNT Right 05/23/2015    SHUNT REMOVAL VENTRICULAR PERITONEAL performed by [facility]ould, [facility]   ??? CSF SHUNT Left 06/03/2015    SHUNT CREATION VENTRICULAR PERITONEAL performed by [facility], [facility]   ??? PR LITHOLAPAXY SMPL/SM <2.5 CM N/A 08/05/2015    CYSTOURETHROSCOPY, CYSTOLITHOLAPAXY performed by Pennie Banter, [facility] ??? PR LASER ENUCLEATION PROSTATE W/MORCELLATION N/A 08/19/2015    HOLMIUM LASER ENUCLEATION OF PROSTATE (NO MORCELLATION) performed by Vonna Drafts, MD [facility]   ??? PR LITHOLAPAXY COMP/LG > 2.5 CM N/A 08/19/2015    CYSTOSCOPY performed by Vonna Drafts, [facility]   ??? PR CYSTO W/IRRIG & EVAC MULTPLE OBSTRUCTING CLOTS N/A 08/29/2015    CYSTOSCOPY EVACUATION CLOTS performed by Vonna Drafts, [facility]   ??? CYSTOSCOPY N/A 02/20/2016    CYSTOSCOPY, URETHERAL DILATION performed by Vonna Drafts, [facility] at [facility]   ??? HX ABDOMEN SURGERY      fundoplication   ??? HX BACK SURGERY      repair of spina bifida   ??? HX BRAIN SURGERY  5 months old    Chiari decompression   ??? HX EAR TUBES     ??? HX HERNIA REPAIR      inguinal hernia   ??? HX SURGERY  at 2 weeks    VP Shunt   ??? HX TONSILLECTOMY     ??? HX TRACHEOSTOMY     ??? SHUNT REVISION  3 months old   ??? SHUNT REVISION  26 years old     Family History   Problem Relation Age of Onset   ??? Diabetes Father    ??? Hypertension Father    ??? Other Father      glaucoma   ??? High Cholesterol Maternal Grandfather      Current Outpatient Prescriptions   Medication Sig Dispense Refill   ??? dextroamphetamine-amphetamine (ADDERALL) 5 mg tablet Take 1 tablet by mouth twice daily for 30 days Earliest Fill Date: 06/23/16 60 tablet 0   ??? imipramine (TOFRANIL) 25 mg tablet Take 2 tabs in the am and 1 in the pm 270 tablet 1   ??? levETIRAcetam (KEPPRA) 500 mg tablet Take 1 tablet by mouth twice daily. 60 tablet 11   ??? MV,Ca,Min-Iron-FA-Lycopene (CENTRUM MEN) 8 mg iron- 200 mcg-600 mcg tab Take 1 Tab by mouth daily.     ??? oxybutynin chloride (DITROPAN) 5 mg tablet Take 1 tablet by mouth three times daily. 270 tablet 3   ??? oxybutynin XL (DITROPAN XL) 15 mg tablet Take 1 tablet by mouth daily. 90 tablet 1   ??? polycarbophil (FIBERCON) 625 mg tablet Take 625 mg by mouth twice daily.       No current facility-administered medications for this visit.      Allergies   Allergen Reactions ??? Latex SHORTNESS OF BREATH     Allergy recorded in SMS: Latex~Reactions: RASH   ??? Ceclor [Cefaclor] HIVES   ??? Clindamycin HIVES   ??? Zosyn [Piperacillin-Tazobactam] HIVES   ??? Amoxicillin UNKNOWN     Social History     Social History   ??? Marital status: Single     Spouse name: N/A   ??? Number of children: N/A   ??? Years of education: N/A     Occupational History   ???  [employer] Student     Social History Main Topics   ??? Smoking status: Never Smoker   ??? Smokeless tobacco: Never Used   ??? Alcohol use No   ??? Drug use: No   ??? Sexual activity: Not Currently     Partners: Female     Other Topics Concern   ??? Not on file     Social History Narrative   ??? No narrative on file           Objective:         Vitals:    06/28/16 1001   BP: (!) 159/94   Pulse: (!) 123   Weight: 103.1 kg (227 lb 3.2 oz)   Height: 162.6 cm (64.02)     Body mass index is 38.98 kg/m???.     Physical Exam   Constitutional: He is oriented to person, place, and time. He appears well-developed and well-nourished. No distress.   HENT:   Head: Normocephalic and atraumatic.   Eyes: Conjunctivae and EOM are normal.   Neck: Normal range of motion. No JVD present. No tracheal deviation present.   Cardiovascular: Normal rate and regular rhythm.    Pulmonary/Chest: Effort normal and breath sounds normal. No stridor. He has no wheezes.   Abdominal: Soft. He exhibits no distension. There is no tenderness. There is no rebound and no guarding.   Obese    Musculoskeletal: Normal range of motion. He exhibits no edema or deformity.   Neurological: He is alert and oriented to person, place, and time. No cranial nerve deficit.   Skin: Skin is warm and dry. No rash noted. He is not diaphoretic. No erythema.   Psychiatric: He has a normal mood and affect. His behavior is normal. Judgment and thought content normal.            Assessment and Plan:  Had a long discussion with the pt and his mother today.  Reviewed treatment options, as well as the risks, benefits, and alternatives of each treatment option.  The treatment options discussed including a conduit with urostomy and continent, catheterizable diversions.  Specifically for a continent diversion, this would also require closure of the bladder neck given his urodynamics findings.  The general risks of surgery discussed included but were not limited to death, DVT/PE, MI, CVA, urine leak, bowel leak, stricture, obstruction (bowel or urinary), need for re-operation, adverse outcome, hernia, wound breakdown, bleeding, infection, and poor cosmesis.  Discussed that the risk of reoperation would be higher with a catheterizable channel than conduit.  Also discussed that there is a higher risk for stones with a continent, catheterizable diversion.   After careful consideration, the patient has elected to proceed with ileal conduit diversion.   Will perform concomitant appendectomy (history of MACE, which has now scarred down and not in use).      Junius Roads, MD  PGY-4 Urology             Attestation    I saw and evaluated pt with the resident in clinic and performed the key portions of the e/m.  I agree with the above assessment and plan.  I had a lengthy discussion with the pt, and his mother who presented with him today.  I discussed the treatment options, as well as the risks, benefits, and alternatives of each treatment option.  The treatment options discussed included observation, augmentation cystoplasty, augmentation cystoplasty with creation of catheterizable channel, cystectomy with urinary diversion.  The patient is completely incontinent currently in  the feet underwent augmentation in either form he would require an anti-incontinence procedure such as an artificial urinary sphincter.  We did extensive discussion about the risks, benefits, and alternatives of each procedure the risks discussed included, but were not limited to, bleeding, infection, death, urine/bowel leak, DVT PE/VP shunt infection, stricture, recurrence, hernia,, adverse outcome.  Specifically with the augmentation cystoplasty we also discussed an increased risk of repeat procedures in the future as well as risk of stones.  He has a history of stones in his bladder in this is a significant concern to him.  He also is concerned with undergoing the surgery that may leave him prone to needing future surgeries.  As with cystectomy and urinary diversion, we discussed that the risk of needing a second operation in the future is approximately 10-15%.  Certainly there is a lifespan to urinary diversions we discussed that this is approximately 3035 years and he therefore may need a second surgery at some point to manage this.  After extensive discussion he would like to proceed with cystectomy and urinary diversion with ileal conduit.  Informed consent was obtained.  Patient has a history of a Mace procedure and will require takedown at the time of his surgery.  Informed consent was obtained.  We will schedule him in the near future.

## 2016-06-29 ENCOUNTER — Encounter: Admit: 2016-06-29 | Discharge: 2016-06-29 | Payer: MEDICARE

## 2016-06-29 DIAGNOSIS — N319 Neuromuscular dysfunction of bladder, unspecified: Principal | ICD-10-CM

## 2016-06-29 MED ORDER — HEPARIN, PORCINE (PF) 5,000 UNIT/0.5 ML IJ SYRG
5000 [IU] | Freq: Once | SUBCUTANEOUS | 0 refills | Status: CN
Start: 2016-06-29 — End: ?

## 2016-06-29 MED ORDER — ALVIMOPAN 12 MG PO CAP
12 mg | Freq: Once | ORAL | 0 refills | Status: CN
Start: 2016-06-29 — End: ?

## 2016-06-30 DIAGNOSIS — D509 Iron deficiency anemia, unspecified: ICD-10-CM

## 2016-06-30 DIAGNOSIS — Z01818 Encounter for other preprocedural examination: ICD-10-CM

## 2016-07-08 ENCOUNTER — Encounter: Admit: 2016-07-08 | Discharge: 2016-07-08 | Payer: MEDICARE

## 2016-07-08 ENCOUNTER — Ambulatory Visit: Admit: 2016-07-08 | Discharge: 2016-07-09 | Payer: MEDICARE

## 2016-07-08 DIAGNOSIS — G919 Hydrocephalus, unspecified: ICD-10-CM

## 2016-07-08 DIAGNOSIS — N319 Neuromuscular dysfunction of bladder, unspecified: ICD-10-CM

## 2016-07-08 DIAGNOSIS — Q0700 Arnold-Chiari syndrome without spina bifida or hydrocephalus: ICD-10-CM

## 2016-07-08 DIAGNOSIS — N211 Calculus in urethra: ICD-10-CM

## 2016-07-08 DIAGNOSIS — R51 Headache: ICD-10-CM

## 2016-07-08 DIAGNOSIS — Q052 Lumbar spina bifida with hydrocephalus: ICD-10-CM

## 2016-07-08 DIAGNOSIS — Q059 Spina bifida, unspecified: Principal | ICD-10-CM

## 2016-07-08 DIAGNOSIS — R339 Retention of urine, unspecified: ICD-10-CM

## 2016-07-08 DIAGNOSIS — N200 Calculus of kidney: ICD-10-CM

## 2016-07-08 DIAGNOSIS — F988 Other specified behavioral and emotional disorders with onset usually occurring in childhood and adolescence: Principal | ICD-10-CM

## 2016-07-08 DIAGNOSIS — T85618A Breakdown (mechanical) of other specified internal prosthetic devices, implants and grafts, initial encounter: ICD-10-CM

## 2016-07-08 DIAGNOSIS — R569 Unspecified convulsions: ICD-10-CM

## 2016-07-08 DIAGNOSIS — N390 Urinary tract infection, site not specified: ICD-10-CM

## 2016-07-08 MED ORDER — DEXTROAMPHETAMINE-AMPHETAMINE 5 MG PO TAB
5 mg | ORAL_TABLET | Freq: Two times a day (BID) | ORAL | 0 refills | Status: AC
Start: 2016-07-08 — End: 2016-09-29

## 2016-07-08 MED ORDER — DEXTROAMPHETAMINE-AMPHETAMINE 5 MG PO TAB
5 mg | ORAL_TABLET | Freq: Two times a day (BID) | ORAL | 0 refills | Status: AC
Start: 2016-07-08 — End: ?

## 2016-07-08 MED ORDER — DEXTROAMPHETAMINE-AMPHETAMINE 5 MG PO TAB
5 mg | ORAL_TABLET | Freq: Two times a day (BID) | ORAL | 0 refills | Status: AC
Start: 2016-07-08 — End: 2016-08-13

## 2016-07-12 NOTE — Progress Notes
Date of Service: 07/08/2016    Allen Gill is a 26 y.o. male.  DOB: March 17, 1990  MRN: [id number]     Subjective:             History of Present Illness  Pt returns to discuss recent visits with urology and current plan to solve inability to self cath since removal of prostate stones. He was offered urostomy and a continent conduit solution. The latter would carry risk of stone formation so pt is electing the urostomy and has tentative plans to proceed with surgery in the near future.  Medically he is stable on current regimen,  He has had no recurrent problems with headache etc since his last shunt revision.  He continues on adderall with good effect for his ADD.         Review of Systems   Constitutional: Negative for chills, fatigue, fever and unexpected weight change.   Respiratory: Negative for cough.    Cardiovascular: Negative for chest pain.   Gastrointestinal: Negative for constipation, diarrhea, nausea and vomiting.   Genitourinary: Positive for difficulty urinating.   Musculoskeletal: Negative for joint swelling.   Neurological: Negative for weakness and headaches.         Objective:         ??? [START ON 07/23/2016] dextroamphetamine-amphetamine (ADDERALL) 5 mg tablet Take 1 tablet by mouth twice daily for 30 days Earliest Fill Date: 07/23/16   ??? [START ON 08/23/2016] dextroamphetamine-amphetamine (ADDERALL) 5 mg tablet Take 1 tablet by mouth twice daily for 30 days Earliest Fill Date: 08/23/16   ??? [START ON 09/23/2016] dextroamphetamine-amphetamine (ADDERALL) 5 mg tablet Take 1 tablet by mouth twice daily for 30 days Earliest Fill Date: 09/23/16   ??? imipramine (TOFRANIL) 25 mg tablet Take 2 tabs in the am and 1 in the pm   ??? levETIRAcetam (KEPPRA) 500 mg tablet Take 1 tablet by mouth twice daily.   ??? MV,Ca,Min-Iron-FA-Lycopene (CENTRUM MEN) 8 mg iron- 200 mcg-600 mcg tab Take 1 Tab by mouth daily.   ??? oxybutynin chloride (DITROPAN) 5 mg tablet Take 1 tablet by mouth three times daily. ??? oxybutynin XL (DITROPAN XL) 15 mg tablet Take 1 tablet by mouth daily.   ??? polycarbophil (FIBERCON) 625 mg tablet Take 625 mg by mouth twice daily.     Vitals:    07/08/16 1144   BP: 140/90   Pulse: 81   SpO2: 97%   Weight: 105.3 kg (232 lb 3.2 oz)   Height: 162.6 cm (64.02)     Body mass index is 39.84 kg/m???.     Physical Exam   Musculoskeletal:   Skeletal changes of his spina bifida. AFO braces bilat.            Assessment and Plan:  1. Attention deficit disorder (ADD) without hyperactivity     2. 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 Neurogenic bladder     4. Urethral stone     5. Urinary retention       Supported pts decision to proceed with urostomy as the best solution to his current proble with least chance of additional problems in the future.  adderall refilled.

## 2016-07-22 ENCOUNTER — Ambulatory Visit: Admit: 2016-07-22 | Discharge: 2016-07-23 | Payer: MEDICARE

## 2016-07-22 ENCOUNTER — Inpatient Hospital Stay: Admit: 2016-07-22 | Discharge: 2016-07-22 | Payer: MEDICARE

## 2016-07-22 ENCOUNTER — Encounter: Admit: 2016-07-22 | Discharge: 2016-07-22 | Payer: MEDICARE

## 2016-07-22 DIAGNOSIS — G4733 Obstructive sleep apnea (adult) (pediatric): ICD-10-CM

## 2016-07-22 DIAGNOSIS — N390 Urinary tract infection, site not specified: ICD-10-CM

## 2016-07-22 DIAGNOSIS — F988 Other specified behavioral and emotional disorders with onset usually occurring in childhood and adolescence: ICD-10-CM

## 2016-07-22 DIAGNOSIS — Q059 Spina bifida, unspecified: Secondary | ICD-10-CM

## 2016-07-22 DIAGNOSIS — R569 Unspecified convulsions: ICD-10-CM

## 2016-07-22 DIAGNOSIS — N319 Neuromuscular dysfunction of bladder, unspecified: ICD-10-CM

## 2016-07-22 DIAGNOSIS — Q0700 Arnold-Chiari syndrome without spina bifida or hydrocephalus: ICD-10-CM

## 2016-07-22 DIAGNOSIS — R51 Headache: ICD-10-CM

## 2016-07-22 DIAGNOSIS — T85618A Breakdown (mechanical) of other specified internal prosthetic devices, implants and grafts, initial encounter: ICD-10-CM

## 2016-07-22 DIAGNOSIS — G919 Hydrocephalus, unspecified: ICD-10-CM

## 2016-07-22 DIAGNOSIS — N200 Calculus of kidney: ICD-10-CM

## 2016-07-22 LAB — CBC
Lab: 13 g/dL — ABNORMAL LOW (ref 13.5–16.5)
Lab: 18 % — ABNORMAL HIGH (ref 11–15)
Lab: 31 g/dL — ABNORMAL LOW (ref 32.0–36.0)
Lab: 362 10*3/uL (ref 150–400)
Lab: 41 % (ref 40–50)
Lab: 5.7 M/UL — ABNORMAL HIGH (ref 4.4–5.5)
Lab: 6.9 10*3/uL (ref 4.5–11.0)
Lab: 9.2 FL (ref 7–11)

## 2016-07-22 NOTE — Pre-Anesthesia Patient Instructions
GENERAL INFORMATION    Before you come to the hospital  ??? Make arrangements for a responsible adult to drive you home and stay with you for 24 hours following surgery.  ??? Bath/Shower Instructions  ??? Take a bath or shower using the special soap given to you in PAC. Use half the bottle the night before, and the other half the morning of your procedure. Use clean towels with each bath or shower.  ??? Put on clean clothes after bath or shower.  Avoid using lotion and oils.  ??? If you are having surgery above the waist, wear a shirt that fastens up the front.  ??? Sleep on clean sheets if bath or shower is done the night before procedure.  ??? Leave money, credit cards, jewelry, and any other valuables at home. The [facility] is not responsible for the loss or breakage of personal items.  ??? Remove nail polish, makeup and all jewelry (including piercings) before coming to the hospital.  ??? The morning of your procedure:  ??? brush your teeth and tongue  ??? do not smoke  ??? do not shave the area where you will have surgery    What to bring to the hospital  ??? ID/ Insurance Card  ??? Medical Device card  ??? Official documents for legal guardianship   ??? Copy of your Living Will, Advanced Directives, and/or Durable Power of Attorney   ??? Small bag with a few personal belongings  ??? Cases for glasses/hearing aids/contact lens (bring solutions for contacts)  ??? Dress in clean, loose, comfortable clothing     Eating or drinking before surgery  ??? Do not eat or drink anything after 11:00 p.m. the day before your procedure (including gum, mints, candy, or chewing tobacco) OR follow the specific instructions you were given by your Surgeon.  ??? You may have WATER ONLY up to 2 hours before arriving at the hospital.  ??? Other instructions: ***     Other instructions  Notify your surgeon if:  ??? there is a possibility that you are pregnant  ??? you become ill with a cough, fever, sore throat, nausea, vomiting or flu-like symptoms ??? you have any open wounds/sores that are red, painful, draining, or are new since you last saw  the doctor  ??? you need to cancel your procedure  ??? You will receive a call with your surgery arrival time from between 2:30pm and 4:30pm the last business day before your procedure.  If you do not receive a call, please call [phone] before 4:30pm or [phone] after 4:30pm.    Notify us at [facility]: [phone]  ??? if you need to cancel your procedure  ??? if you are going to be late    Arrival at the hospital  [facility]  [address]  [place], [place] [address]    ??? Park in the [employer], located directly across from the main entrance to the hospital.  ??? Valet parking is available  from 7 AM to 4 PM Monday through Friday.  ??? Enter through the ground floor main hospital entrance and check in at the Information Desk in the lobby.  ??? They will validate your parking ticket and direct you to the next location.  ??? If you are a woman between the ages of 97 and 72, and have not had a hysterectomy, you will be asked for a urine sample prior to surgery.  Please do not urinate before  arriving in the Surgery Waiting Room.  Once there, check in and let the attendant know if you need to provide a sample.

## 2016-07-23 DIAGNOSIS — D509 Iron deficiency anemia, unspecified: ICD-10-CM

## 2016-07-23 DIAGNOSIS — Z01818 Encounter for other preprocedural examination: ICD-10-CM

## 2016-07-23 DIAGNOSIS — Z01810 Encounter for preprocedural cardiovascular examination: Principal | ICD-10-CM

## 2016-07-23 DIAGNOSIS — R000 Tachycardia, unspecified: Secondary | ICD-10-CM

## 2016-07-30 ENCOUNTER — Ambulatory Visit: Admit: 2016-07-30 | Discharge: 2016-07-30 | Payer: MEDICARE

## 2016-07-30 ENCOUNTER — Encounter: Admit: 2016-07-30 | Discharge: 2016-07-30 | Payer: MEDICARE

## 2016-07-30 DIAGNOSIS — Q059 Spina bifida, unspecified: Principal | ICD-10-CM

## 2016-07-30 DIAGNOSIS — I10 Essential (primary) hypertension: ICD-10-CM

## 2016-07-30 DIAGNOSIS — Q0700 Arnold-Chiari syndrome without spina bifida or hydrocephalus: Principal | ICD-10-CM

## 2016-07-30 DIAGNOSIS — Z01810 Encounter for preprocedural cardiovascular examination: ICD-10-CM

## 2016-07-30 DIAGNOSIS — R569 Unspecified convulsions: ICD-10-CM

## 2016-07-30 DIAGNOSIS — F988 Other specified behavioral and emotional disorders with onset usually occurring in childhood and adolescence: ICD-10-CM

## 2016-07-30 DIAGNOSIS — G919 Hydrocephalus, unspecified: ICD-10-CM

## 2016-07-30 DIAGNOSIS — R51 Headache: ICD-10-CM

## 2016-07-30 DIAGNOSIS — G4733 Obstructive sleep apnea (adult) (pediatric): ICD-10-CM

## 2016-07-30 DIAGNOSIS — N319 Neuromuscular dysfunction of bladder, unspecified: ICD-10-CM

## 2016-07-30 DIAGNOSIS — T85618A Breakdown (mechanical) of other specified internal prosthetic devices, implants and grafts, initial encounter: ICD-10-CM

## 2016-07-30 DIAGNOSIS — N390 Urinary tract infection, site not specified: ICD-10-CM

## 2016-07-30 DIAGNOSIS — I420 Dilated cardiomyopathy: ICD-10-CM

## 2016-07-30 DIAGNOSIS — N200 Calculus of kidney: ICD-10-CM

## 2016-07-30 MED ORDER — CARVEDILOL 6.25 MG PO TAB
6.25 mg | ORAL_TABLET | Freq: Two times a day (BID) | ORAL | 3 refills | 90.00000 days | Status: AC
Start: 2016-07-30 — End: 2016-08-13

## 2016-07-30 MED ORDER — SPIRONOLACTONE 25 MG PO TAB
25 mg | ORAL_TABLET | Freq: Every day | ORAL | 3 refills | 90.00000 days | Status: AC
Start: 2016-07-30 — End: 2017-04-19

## 2016-07-30 MED ORDER — PERFLUTREN LIPID MICROSPHERES 1.1 MG/ML IV SUSP
1-20 mL | Freq: Once | INTRAVENOUS | 0 refills | Status: CP
Start: 2016-07-30 — End: ?
  Administered 2016-07-30: 21:00:00 2 mL via INTRAVENOUS

## 2016-07-30 NOTE — Progress Notes
Procedure explained, questions answered and Definity administered per standard without complications.   Total of __2_ ml of Definity/NS given slow IVP per sonographer direction.

## 2016-08-02 ENCOUNTER — Encounter: Admit: 2016-08-02 | Discharge: 2016-08-02 | Payer: MEDICARE

## 2016-08-02 NOTE — Telephone Encounter
Pts mother calling and left a message asking that we not cancel the surgery scheduled for 7/27.  Pt has an MPI tomorrow.      Chart reviewed and I contacted pt and his mother.  She tells me that pt is having issues and leaking urine and his skin is starting to break down and she is very concerned about an infection.  She said that an infection would make everything worse including the heart.  They are both under the impression that his heart is in much better shape now than it was with his previous surgeries.    I went through the notes in pt recent office visit, his mom said she did not make it to that appointment as she didn't understand it would be as involved as it was.    He has an MPI tomorrow and we discussed that his EF is down and we are trying to find the reason this has happened.  The MPI will let us know if there are blockages, which could be a reason for the cardiomyopathy.  He will also need to see our heart failure team in consult so his medications adjusted and added so his heart receives benefit.      [place] is trying to call the urologist to see if he will do the surgery anyway.  I did let them know that even if the surgeon agreed, they would also need the approval of the anesthesiologist.      They said they do not understand why the surgery should be postponed when it will need to be done, and probably done urgently.  We continued to go over the rationale behind Dr. Mellody Life recommendation.      I did offer to have Dr. Doristine Counter call her if she needed further explanation but she said she understands what we are saying about his low EF and the need to look further into the reasons for it and the treatment needed.      It was left that Clenton is going to continue to try and contact Urology, he will have his MPI tomorrow and we will wait for those results.     His mom then said that we had told him he didn't need the medications as he was on something before. Full chart reviewed.  We saw him when he was here for septicemia in March of 2014 and EF was 40%.  At that time, we put him on coreg, lisinopril and spironolactone.  These were also no his discharge papers.      He had a referral for an echo in July of 2014.  EF was >55%.  We did not see him nor did we instruct him to stop any medications.   The only other time we have seen him is 07/30/16 in clinic.  I do not know when or who stopped the medications.

## 2016-08-03 ENCOUNTER — Ambulatory Visit: Admit: 2016-08-03 | Discharge: 2016-08-03 | Payer: MEDICARE

## 2016-08-03 DIAGNOSIS — Z01810 Encounter for preprocedural cardiovascular examination: ICD-10-CM

## 2016-08-03 DIAGNOSIS — Q0700 Arnold-Chiari syndrome without spina bifida or hydrocephalus: Principal | ICD-10-CM

## 2016-08-03 MED ORDER — SODIUM CHLORIDE 0.9 % IV SOLP
250 mL | INTRAVENOUS | 0 refills | Status: AC | PRN
Start: 2016-08-03 — End: ?

## 2016-08-03 MED ORDER — NITROGLYCERIN 0.4 MG SL SUBL
.4 mg | SUBLINGUAL | 0 refills | Status: AC | PRN
Start: 2016-08-03 — End: ?

## 2016-08-03 MED ORDER — REGADENOSON 0.4 MG/5 ML IV SYRG
.4 mg | Freq: Once | INTRAVENOUS | 0 refills | Status: CP
Start: 2016-08-03 — End: ?
  Administered 2016-08-03: 13:00:00 0.4 mg via INTRAVENOUS

## 2016-08-03 MED ORDER — AMINOPHYLLINE 500 MG/20 ML IV SOLN
50 mg | INTRAVENOUS | 0 refills | Status: AC | PRN
Start: 2016-08-03 — End: ?

## 2016-08-03 MED ORDER — ALBUTEROL SULFATE 90 MCG/ACTUATION IN HFAA
2 | RESPIRATORY_TRACT | 0 refills | Status: DC | PRN
Start: 2016-08-03 — End: 2016-08-08

## 2016-08-03 NOTE — Progress Notes
Peripheral IV Insertion Note:  Patient Side: left  Line Orientation:Antecubital  IV Catheter Size: 22G  Number of Attempts:1.  IV capped and flushed with Normal Saline.  IV site without redness, swelling, or pain.  New dressing placed.    After procedure IV cannula removed intact and hemostasis achieved.

## 2016-08-04 NOTE — Telephone Encounter
Pt is calling back asking about thallium results and what an upcoming appt that is scheduled is about. I reviewed thallium with Dr. Doristine Counter. He states no ischemia, but EF low at 29%. This confirms cardiomyopathy which was seen on echo, and heart failure needs to be treated before pt can proceed with surgery. Pt should followup in heart failure clinic next week. Pt does have an appt with H. Baig APRN in HF clinic on 08/11/16.     I called pt back and reviewed thallium results and recommendations. Explained appt on 08/11/16 is for heart failure treatment and that his surgery is cancelled for now while heart failure is addressed.Marland Kitchen He verbalizes understanding.

## 2016-08-04 NOTE — Telephone Encounter
Pt called today asking for his thallium results from yesterday. The report is in process. I explained that once it is read we will review and call him with recommendations. I told him that Dr. Doristine Counter is here in the [facility] office tomorrow so we will discuss with him then if not today. He verbalizes understanding.

## 2016-08-05 ENCOUNTER — Encounter: Admit: 2016-08-05 | Discharge: 2016-08-05 | Payer: MEDICARE

## 2016-08-05 NOTE — Telephone Encounter
Talked with Dr. Leonette Monarch nurse (Urology), to let her know that pt has not been cleared from a cardiac standpoint and we are arranging further work-up to address the cardiomyopathy.  I also let her know the patient and his mother are struggling with this as they felt the surgery should not be cancelled.     She tells me the surgery is not on the schedule for tomorrow so it must have been cancelled.  She will follow-up with Dr. Oren Bracket to be sure he is aware of our plan.

## 2016-08-05 NOTE — Telephone Encounter
Spoke with [facility] in cardiology, she states patient was scheduled for Dr. Oren Bracket in surgery tomorrow but they have not cleared patient for surgery due to EF of 30%. They have further work up to do and then he may be cleared at a later date. Called Dr. Leonette Monarch scheduler, they are aware and patient has been postponed and is not on for tomorrow.

## 2016-08-06 ENCOUNTER — Inpatient Hospital Stay: Admit: 2016-08-06 | Discharge: 2016-08-06 | Payer: MEDICARE

## 2016-08-07 ENCOUNTER — Encounter: Admit: 2016-08-07 | Discharge: 2016-08-07 | Payer: MEDICARE

## 2016-08-09 ENCOUNTER — Encounter: Admit: 2016-08-09 | Discharge: 2016-08-09 | Payer: MEDICARE

## 2016-08-09 NOTE — Telephone Encounter
Pt is scheduled to see HF physician Dr. [name] on 08/13/16. The appt that he previously had with H. Baig APRN has been cancelled. Thallium results have previously been discussed with pt and his mother.

## 2016-08-13 ENCOUNTER — Encounter: Admit: 2016-08-13 | Discharge: 2016-08-13 | Payer: MEDICARE

## 2016-08-13 DIAGNOSIS — Q0700 Arnold-Chiari syndrome without spina bifida or hydrocephalus: ICD-10-CM

## 2016-08-13 DIAGNOSIS — F988 Other specified behavioral and emotional disorders with onset usually occurring in childhood and adolescence: ICD-10-CM

## 2016-08-13 DIAGNOSIS — N319 Neuromuscular dysfunction of bladder, unspecified: ICD-10-CM

## 2016-08-13 DIAGNOSIS — G919 Hydrocephalus, unspecified: ICD-10-CM

## 2016-08-13 DIAGNOSIS — Q059 Spina bifida, unspecified: Principal | ICD-10-CM

## 2016-08-13 DIAGNOSIS — N390 Urinary tract infection, site not specified: ICD-10-CM

## 2016-08-13 DIAGNOSIS — G4733 Obstructive sleep apnea (adult) (pediatric): ICD-10-CM

## 2016-08-13 DIAGNOSIS — R569 Unspecified convulsions: ICD-10-CM

## 2016-08-13 DIAGNOSIS — R51 Headache: ICD-10-CM

## 2016-08-13 DIAGNOSIS — N200 Calculus of kidney: ICD-10-CM

## 2016-08-13 DIAGNOSIS — T85618A Breakdown (mechanical) of other specified internal prosthetic devices, implants and grafts, initial encounter: ICD-10-CM

## 2016-08-13 MED ORDER — CARVEDILOL 6.25 MG PO TAB
12.5 mg | ORAL_TABLET | Freq: Two times a day (BID) | ORAL | 3 refills | 90.00000 days | Status: AC
Start: 2016-08-13 — End: 2016-09-29

## 2016-08-13 MED ORDER — LISINOPRIL 5 MG PO TAB
5 mg | ORAL_TABLET | Freq: Every day | ORAL | 3 refills | Status: AC
Start: 2016-08-13 — End: 2016-09-29

## 2016-08-13 NOTE — Progress Notes
Date of Service: 08/13/2016    Allen Gill is a 26 y.o. male.       HPI     Allen Gill 26 y.o. pleasant Caucasian male with history of spina bifida, hydrocephalus and neurogenic bladder.  He was diagnosed with cardiomyopathy when he was septic in the ICU about 1-2 years ago.  At which time his ejection fraction was 25%.  He followed up in the cardiology clinic with Dr. Doristine Counter, who had started him on carvedilol and spironolactone.  Patient is here for his management of heart failure and for preoperative clearance for bladder surgery.    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  He is able to walk, and he walks about 3-4 blocks then he gets short of breath, which relieves with rest and increase when he climbs more than 2 flights of Gill.  Patient has been feeling more tired than before.  However, since he was started on carvedilol and spironolactone he is feeling much better.  Patient is concerned about his preoperative evaluation because he is having a lot of pain because of his bladder issues.      Patient denies orthopnea, paroxysmal nocturnal dyspnea, chest pain, and palpitations.    Patient has been compliant with his medications.  However he is eating a high salt diet and drinking a lot of water.         Vitals:    08/13/16 0745   BP: 140/80   Pulse: 96   SpO2: 96%   Weight: 99.8 kg (220 lb)   Height: 1.626 m (5' 4)     Body mass index is 37.76 kg/m???.     Past Medical History  Patient Active Problem List    Diagnosis Date Noted   ??? Dilated cardiomyopathy (HCC) 07/30/2016   ??? Tachycardia 07/30/2016   ??? Essential hypertension 07/30/2016   ??? Preop cardiovascular exam 07/30/2016   ??? Urinary retention 02/19/2016     Added automatically from request for surgery [id number]     ??? Hematuria 08/29/2015   ??? Urethral stone 07/31/2015     Added automatically from request for surgery [id number]     ??? Loculated pleural effusion 05/19/2015   ??? Morbid obesity (HCC) 05/19/2015 ??? Seizure disorder (HCC) 05/19/2015   ??? Transaminitis 05/19/2015   ??? Hydrocephalus 04/28/2015   ??? Shunt malfunction 06/07/2014   ??? Kidney stone 02/14/2012     [place] 12/27/11: The kidneys are normal in size. The right kidney measures 11.0 x 5.5 cm and contains a large calculus in the mid to inferior pelvis, measuring 4.8 cm x  0.6 cm x 1.5 cm. There is mild pelviectasis which fails to resolve on post void imaging. The left kidney measures 11.7 x 5.4 cm. No renal masses are identified.  02/28/12 CT showed right staghorn calculus and a small left upper pole stone.  04/07/12 right PCNL - stable post op course for 2 days and discharged. Readmitted with hypotension and respiratory distress and acute drop in H/H. No PE and pleural effusions. No signs of bleeding/no perinephric bleeding. Sepsis w/u neg but placed on erapenum. Second look dealyed until 04/21/12 - successful and dishcaged to [facility]  06/09/12 Recovered fully and now for metabolic w/u               ??? 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 Arnold-Chiari malformation (HCC) 11/27/2009   ??? ADD (attention deficit disorder) 10/28/2006   ??? Spina bifida (HCC) 10/28/2006   ??? Neurogenic  bladder 10/28/2006     Neurogenic bladder secondary to Spina Bifida  06/29/2012 - Doing well on Ditropan and Imipramine, continues to [facility] q3-4hrs     ??? Unspecified constipation 10/28/2006         Review of Systems   Constitution: Negative. Negative for decreased appetite.   HENT: Negative.    Eyes: Negative.    Cardiovascular: Negative.  Negative for chest pain.   Respiratory: Positive for shortness of breath.    Endocrine: Negative.    Hematologic/Lymphatic: Negative.    Skin: Negative.    Musculoskeletal: Negative.    Gastrointestinal: Negative.    Genitourinary: Negative.    Neurological: Negative.    Psychiatric/Behavioral: Negative.    Allergic/Immunologic: Negative.    All other systems reviewed and are negative.      Physical Exam   Constitutional: He appears well-developed and well-nourished. HENT:   Head: Atraumatic.   Neck: Neck supple. No JVD present.   Cardiovascular: Normal rate, regular rhythm and normal heart sounds.  Exam reveals no gallop and no friction rub.    No murmur heard.  Pulmonary/Chest: No respiratory distress. He has no wheezes. He has no rales.   Abdominal: There is no tenderness. There is no rebound.   Musculoskeletal: He exhibits deformity. He exhibits no edema or tenderness.   Neurological: He is alert and oriented to person, place, and time.   Skin: Skin is warm and dry.   Psychiatric: He has a normal mood and affect.         Cardiovascular Studies  All reviewed from today.  I reviewed his EKG, echocardiogram and nuclear stress test.    Nuclear Stress Test:     This study is abnormal.  Left ventricular systolic function is markedly reduced.  There is diffuse left ventricular hypokinesis with near akinesis of the inferolateral segment.  There does not appear to be evidence of significant active inducible ischemia.  Other than the reduced ejection fraction no other negative prognostic indicators are present.  Viability appears to be preserved throughout the myocardium.  By report the patient had undergone previous study on 04/13/2012.  Those images could not be retrieved for comparison.  However on the prior study ejection fraction was reported as 56%.      Echo 07/30/2016    1.  Normal left ventricle size and wall thickness.  Moderately depressed function with global hypokinesis and ejection fraction=30-35%.  Mild diastolic dysfunction.  2.  No mitral regurgitation.  3.  Unable to measure pulmonary artery pressure.  4.  Normal right ventricle size and ejection fraction.  5.  Normal central venous pressure.  6.  Left ventricular function has improved slightly since 05/20/2015 echocardiogram.      Problems Addressed Today  Encounter Diagnoses   Name Primary?   ??? Dilated cardiomyopathy (HCC) Yes   ??? 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  Patient has a history of spina bifida, hydrocephalus and neurogenic bladder with multiple stones.  1.  Stage B CHF NYHA class II secondary to nonischemic cardiomyopathy: Euvolemic today  ???Ischemia evaluation was done with a nuclear stress test patient did not have a coronary angiogram  ???The etiology of his cardiomyopathy is unknown.  We will get an MRI with gadolinium enhancement to study the etiology of his cardiomyopathy.  It might  be related to his syndrome causing the spina bifida hydrocephalus and neurogenic bladder, however this is very rare.  Other causes of cardiomyopathy will be investigated.  ???Continue peroneal lactone, increase carvedilol from 6.25-12.5 mg orally twice daily, lisinopril 5 mg orally once daily.  We will titrate his heart failure medications in the future visits to my clinic or nurse practitioner clinic.  ???I communicated with the patient's primary care physician Dr. Freida Busman in order to see if it is possible to take the patient off Adderall, given the tachycardia would not be favorable for cardiomyopathy.  ???I will obtain labs in 2 weeks  ???I will see the patient to my clinic in 1 month    2.  Preoperative evaluation for bladder surgery:  ???Patient had a nuclear stress test which was negative for ischemia, and he does not have decompensated heart failure at this point.  Patient does not have any arrhythmias.  Also, he does not have any valvular heart disease.  No further cardiac testing is needed before proceeding with his bladder surgery.  Patient risk of major cardiac event during surgery is 6.6% based on the RCRI score    3.  ADHD:  ???Patient is on Adderall, I communicated with the patient's PCP Dr. Freida Busman.  In order to take the patient off the medicine if possible.  There is some case reports of Adderall induced cardiomyopathy.  For the least it would increase tachycardia which is unfavorable and patient's cardia myopathy as mentioned above.    4.  Neurogenic bladder with multiple stones:  ???Patient follow-up with urology and he is awaiting surgery for his bladder.    5.  Spina bifida and history of hydrocephalus:  ???Patient follows up with neurosurgery    6.  I counseled the patient for low-salt diet and healthy lifestyle.         Current Medications (including today's revisions)  ??? carvedilol (COREG) 6.25 mg tablet Take 2 tablets by mouth twice daily. Take with food.   ??? [START ON 08/23/2016] dextroamphetamine-amphetamine (ADDERALL) 5 mg tablet Take 1 tablet by mouth twice daily for 30 days Earliest Fill Date: 08/23/16   ??? [START ON 09/23/2016] dextroamphetamine-amphetamine (ADDERALL) 5 mg tablet Take 1 tablet by mouth twice daily for 30 days Earliest Fill Date: 09/23/16   ??? imipramine (TOFRANIL) 25 mg tablet Take 2 tabs in the am and 1 in the pm   ??? levETIRAcetam (KEPPRA) 500 mg tablet Take 1 tablet by mouth twice daily.   ??? lisinopril (PRINIVIL; ZESTRIL) 5 mg tablet Take 1 tablet by mouth daily.   ??? MV,Ca,Min-Iron-FA-Lycopene (CENTRUM MEN) 8 mg iron- 200 mcg-600 mcg tab Take 1 Tab by mouth daily.   ??? oxybutynin chloride (DITROPAN) 5 mg tablet Take 1 tablet by mouth three times daily.   ??? polycarbophil (FIBERCON) 625 mg tablet Take 625 mg by mouth twice daily.   ??? spironolactone (ALDACTONE) 25 mg tablet Take 1 tablet by mouth daily. Take with food.

## 2016-08-13 NOTE — Telephone Encounter
-----   Message from [facility], MD sent at 08/13/2016  9:03 AM CDT -----  Regarding: [place] Patient   Contact: [phone]  Lendell Caprice,   I saw Mr. Lomeli today in my advanced heart failure clinic.  I was wondering if its possible to take him off the Adderall?  He is on the tacycardiac side which would not be helping his cardiomyopathy.  I increased his b-blockers, put him on ACE-I and continued his spiro.  I will also get an MRI for further evaluation.  For the surgery I would not need any further cardiac evaluation and the above work up is targeted towards the cardiomyopathy.   If you have any questions or would like to discuss, please feel free to call me.   Best regards,   [place]  [phone]

## 2016-08-13 NOTE — Telephone Encounter
I called pt and instructed him to stop the adderall.  He will notify me and/or return to the office if he feels he cannot function without it and we will see about a non-stimulant replacement but I am hopeful he can do without it.

## 2016-08-14 ENCOUNTER — Ambulatory Visit: Admit: 2016-08-13 | Discharge: 2016-08-14 | Payer: MEDICARE

## 2016-08-14 DIAGNOSIS — N319 Neuromuscular dysfunction of bladder, unspecified: ICD-10-CM

## 2016-08-14 DIAGNOSIS — I10 Essential (primary) hypertension: ICD-10-CM

## 2016-08-14 DIAGNOSIS — Z01810 Encounter for preprocedural cardiovascular examination: ICD-10-CM

## 2016-08-14 DIAGNOSIS — I420 Dilated cardiomyopathy: Principal | ICD-10-CM

## 2016-08-18 ENCOUNTER — Encounter: Admit: 2016-08-18 | Discharge: 2016-08-18 | Payer: MEDICARE

## 2016-08-18 MED ORDER — IMIPRAMINE HCL 25 MG PO TAB
ORAL_TABLET | ORAL | 0 refills | Status: AC
Start: 2016-08-18 — End: 2016-11-11

## 2016-08-18 NOTE — Telephone Encounter
LOV:07/08/2016 seen for ADD    JTT:[id number] 25mg , 270 tabs, 1 refills 02/17/2016

## 2016-08-27 ENCOUNTER — Ambulatory Visit: Admit: 2016-08-27 | Discharge: 2016-08-27 | Payer: MEDICARE

## 2016-08-27 DIAGNOSIS — I420 Dilated cardiomyopathy: Principal | ICD-10-CM

## 2016-08-27 LAB — POC CREATININE, RAD: Lab: 0.7 mg/dL (ref 0.4–1.24)

## 2016-08-27 MED ORDER — SODIUM CHLORIDE 0.9 % IJ SOLN
50 mL | Freq: Once | INTRAVENOUS | 0 refills | Status: CP
Start: 2016-08-27 — End: ?
  Administered 2016-08-27: 21:00:00 50 mL via INTRAVENOUS

## 2016-08-27 MED ORDER — GADOBENATE DIMEGLUMINE 529 MG/ML (0.1MMOL/0.2ML) IV SOLN
40 mL | Freq: Once | INTRAVENOUS | 0 refills | Status: CP
Start: 2016-08-27 — End: ?
  Administered 2016-08-27: 21:00:00 40 mL via INTRAVENOUS

## 2016-08-30 ENCOUNTER — Encounter: Admit: 2016-08-30 | Discharge: 2016-08-30 | Payer: MEDICARE

## 2016-08-31 ENCOUNTER — Encounter: Admit: 2016-08-31 | Discharge: 2016-08-31 | Payer: MEDICARE

## 2016-08-31 LAB — COMPREHENSIVE METABOLIC PANEL
Lab: 0.8 mg/dL (ref 0.60–1.35)
Lab: 121 mL/min/{1.73_m2} (ref >=60)
Lab: 140 mL/min/{1.73_m2} (ref >=60)
Lab: 73 mg/dL (ref 65–139)

## 2016-08-31 LAB — BNP (B-TYPE NATRIURETIC PEPTI): Lab: <4 pg/mL (ref <100)

## 2016-09-14 ENCOUNTER — Encounter: Admit: 2016-09-14 | Discharge: 2016-09-14 | Payer: MEDICARE

## 2016-09-14 NOTE — Progress Notes
Chart Reviewed. Introduced self to patient.     Pt is agreeable to enrolling in Chronic Care Management (CCM).     Patient states the following:  Awaiting confirmation that he will be having bladder surgery that is scheduled for 10/01/16.  Anticipates having urostomy.  No chronic pain.    Medication questions or concerns addressed:  Adderall-Not taking    Dietary Information: Low sodium    Patient Goals:  Have bladder surgery with Dr. Randa Lynn in near future, because bladder "twisted itself and will be removed"    Continue plan of care.    Barriers:  None at this time    ER Visits: NA  Hospitalizations: NA  Urgent Care: NA  Specialty Appointment: Cardiology 8/3, Urology 7/26  Primary Care Appointment: 6/28  Change in Exercise Lifestyle: NA  Change in Nutrition: Changed to low sodium  Change in Social Support: NA    Patient Care Plan (AVS) to be mailed, including:   Supporting Older Patients with Chronic Conditions-NIH  What Is a Urostomy  Maintaining your Health: Urostomy    Will continue to assess and follow-up on patient regarding the following:  How pt is recovering from surgery-Expected 10/01/16      Future Appointments:  Upcoming appointment date/time/scheduled with: Future Appointments  Date Time Provider [place]   09/21/2016 11:00 AM Thomes Dinning., MD [facility]   09/29/2016 2:00 PM [place], Mayme Genta, MD [facility]   10/21/2016 10:30 AM Thomes Dinning., MD [facility]   12/21/2016 9:15 AM [facility]   12/21/2016 10:15 AM [place], MD [facility]       Instructed patient to call nurse Care Manager as needed with any questions or concerns.

## 2016-09-15 ENCOUNTER — Encounter: Admit: 2016-09-15 | Discharge: 2016-09-15 | Payer: MEDICARE

## 2016-09-15 DIAGNOSIS — N319 Neuromuscular dysfunction of bladder, unspecified: Principal | ICD-10-CM

## 2016-09-15 NOTE — Telephone Encounter
09/14/16-Received message from patient relations that patient is wanting his surgery moved back to 9/7 as originally scheduled.  Called Dr. Leonette Monarch scheduler, per anesthesia clinic they were waiting for his follow up appointments on 9/11 and his cardiology follow up appointment and so patient is on for 10/01/16.  Called PAC, spoke with RN, they re-reviewed his chart and stated they do not need to wait on those appointments but would request a repeat CMP lab prior to surgery. Notified Dr. Oren Bracket of this and the request to move his date up. Okay to order test but cannot move him up at this point as his schedule is full but if we have a cancellation we can let him know.    09/15/16-Called patient and let him know the above information and that even though he was cleared by cardiology we were also waiting on anesthesia. Let him know we spoke with anesthesia yesterday and need a lab test-the the order is placed. He can come to the main campus or [facility] lab to have it drawn. He verbalized understanding. Apologized that we cannot move him up but we can call him if we have a cancellation. He stated he would like to be moved up if possible but right now will plan on 10/01/16. He also requested we contact his mother, Dewayne Hatch and inform her of the above information. Attempted to contact Dewayne Hatch, phone rang, and then call was ended. Unable to leave a message. Will try again later.    Spoke with Dewayne Hatch, patients mother. Let her know the above information, she verbalized frustration that the date has been moved and states that does not work for her or the patient. She states it has been moved multiple times and we keep coming up with reasons to not do the surgery. Let her know the patient needed clearances before Dr. Oren Bracket can operate for the patient's safety. We put the patient's safety first and will not operate until we have all appropriate testing/clearance. She states he has had multiple surgeries in the past and has never run into so many problems. Again, reiterated that for his safety we are getting what each specialty feels is needed prior to the surgery. Right now patient needs a lab test completed and then anesthesia has cleared him and we can perform the surgery on 10/01/16. She states that does not work for them and feels it was not clearly communicated to them that he was taken off of 09/17/16 and moved to 10/01/16. Let her know when the issues came up and further work-up/clearances needed the date was changed, apologized to her that this was not clearly communicated. Unfortunately Dr. Oren Bracket cannot move the date up unless there is a cancellation. She states 9/21 will not work and they need a date after 9/29 due to a wedding. Let her know Dr. Leonette Monarch scheduled can contact the patient and her and offer them the first available date after 10/09/16. She then asked if more testing would be needed. Let her know there is a possibility that updated testing would be needed depending on how far out it is rescheduled. She stated that is frustrating, let her know again, the testing and updated information would be for the patient's safety. The current plan is to have scheduling contact the patient and her for a new date and then we will update anesthesia. There is a possibility they will need further information depending on the new date. No further questions.    Contacted Shelby to call patient/mother regarding new  date.

## 2016-09-16 ENCOUNTER — Encounter: Admit: 2016-09-16 | Discharge: 2016-09-16 | Payer: MEDICARE

## 2016-09-16 NOTE — Telephone Encounter
I spoke with the patient about changing his surgery date from 09/21. He requested that it be scheduled after 09/29 so we have him scheduled on 10/03 with Dr. Oren Bracket. I also informed the patient that an order for some lab work had been placed and that he can have that completed at anytime prior to surgery. He had no questions and he stated he will have the lab work completed soon.

## 2016-09-20 ENCOUNTER — Emergency Department: Admit: 2016-09-20 | Discharge: 2016-09-20 | Disposition: A | Discharge status: Home / Self Care | Payer: MEDICARE

## 2016-09-20 ENCOUNTER — Encounter: Admit: 2016-09-20 | Discharge: 2016-09-20 | Payer: MEDICARE

## 2016-09-20 ENCOUNTER — Emergency Department: Admit: 2016-09-20 | Discharge: 2016-09-20 | Payer: MEDICARE

## 2016-09-20 DIAGNOSIS — R109 Unspecified abdominal pain: Secondary | ICD-10-CM

## 2016-09-20 DIAGNOSIS — R51 Headache: ICD-10-CM

## 2016-09-20 DIAGNOSIS — N200 Calculus of kidney: Secondary | ICD-10-CM

## 2016-09-20 DIAGNOSIS — F988 Other specified behavioral and emotional disorders with onset usually occurring in childhood and adolescence: ICD-10-CM

## 2016-09-20 DIAGNOSIS — T85618A Breakdown (mechanical) of other specified internal prosthetic devices, implants and grafts, initial encounter: ICD-10-CM

## 2016-09-20 DIAGNOSIS — R569 Unspecified convulsions: ICD-10-CM

## 2016-09-20 DIAGNOSIS — Q0700 Arnold-Chiari syndrome without spina bifida or hydrocephalus: ICD-10-CM

## 2016-09-20 DIAGNOSIS — G4733 Obstructive sleep apnea (adult) (pediatric): ICD-10-CM

## 2016-09-20 DIAGNOSIS — N319 Neuromuscular dysfunction of bladder, unspecified: ICD-10-CM

## 2016-09-20 DIAGNOSIS — G919 Hydrocephalus, unspecified: ICD-10-CM

## 2016-09-20 DIAGNOSIS — N390 Urinary tract infection, site not specified: ICD-10-CM

## 2016-09-20 DIAGNOSIS — Q059 Spina bifida, unspecified: Secondary | ICD-10-CM

## 2016-09-20 LAB — COMPREHENSIVE METABOLIC PANEL
Lab: 0.7 mg/dL (ref 0.4–1.24)
Lab: 101 MMOL/L (ref 98–110)
Lab: 109 mg/dL — ABNORMAL HIGH (ref 70–100)
Lab: 135 MMOL/L — ABNORMAL LOW (ref 137–147)
Lab: 15 mg/dL — ABNORMAL LOW (ref 7–25)
Lab: 4.1 MMOL/L (ref 3.5–5.1)

## 2016-09-20 LAB — CBC AND DIFF: Lab: 8 10*3/uL (ref 4.5–11.0)

## 2016-09-20 LAB — URINALYSIS DIPSTICK

## 2016-09-20 LAB — URINALYSIS, MICROSCOPIC

## 2016-09-20 MED ORDER — OXYCODONE 5 MG PO TAB
5 mg | ORAL_TABLET | ORAL | 0 refills | 6.00000 days | Status: AC | PRN
Start: 2016-09-20 — End: 2016-09-29

## 2016-09-20 NOTE — ED Notes
Pt attempted urine sample, but cannot urinate at this time.

## 2016-09-20 NOTE — ED Notes
26 Y/O/M presents to ED with c/o bialteral flank pain x4 days. Pt reports that he has hx of kidney stones and rating his pain 7/10. Pt denies passing any stones. Pt has PMH of spina bifida and reports that he had been straight cathing himself until April. Pt reports that his dr told him his bladder was twisted, causing issues with cathing and now pt wears briefs due to dribbling. Pt denies fevers or chills. Pt ambulatory from triage and placed in ED cart. Pt given call light.

## 2016-09-20 NOTE — ED Notes
Report given to Debbie, RN.

## 2016-09-21 ENCOUNTER — Ambulatory Visit: Admit: 2016-09-21 | Discharge: 2016-09-22 | Payer: MEDICARE

## 2016-09-21 ENCOUNTER — Encounter: Admit: 2016-09-21 | Discharge: 2016-09-21 | Payer: MEDICARE

## 2016-09-21 ENCOUNTER — Ambulatory Visit: Admit: 2016-09-21 | Discharge: 2016-09-21 | Payer: MEDICARE

## 2016-09-21 DIAGNOSIS — R339 Retention of urine, unspecified: ICD-10-CM

## 2016-09-21 DIAGNOSIS — R51 Headache: ICD-10-CM

## 2016-09-21 DIAGNOSIS — N200 Calculus of kidney: ICD-10-CM

## 2016-09-21 DIAGNOSIS — N319 Neuromuscular dysfunction of bladder, unspecified: ICD-10-CM

## 2016-09-21 DIAGNOSIS — N390 Urinary tract infection, site not specified: ICD-10-CM

## 2016-09-21 DIAGNOSIS — I420 Dilated cardiomyopathy: ICD-10-CM

## 2016-09-21 DIAGNOSIS — T85618A Breakdown (mechanical) of other specified internal prosthetic devices, implants and grafts, initial encounter: ICD-10-CM

## 2016-09-21 DIAGNOSIS — G919 Hydrocephalus, unspecified: ICD-10-CM

## 2016-09-21 DIAGNOSIS — Q0700 Arnold-Chiari syndrome without spina bifida or hydrocephalus: ICD-10-CM

## 2016-09-21 DIAGNOSIS — Q052 Lumbar spina bifida with hydrocephalus: Principal | ICD-10-CM

## 2016-09-21 DIAGNOSIS — Z982 Presence of cerebrospinal fluid drainage device: ICD-10-CM

## 2016-09-21 DIAGNOSIS — Q059 Spina bifida, unspecified: Secondary | ICD-10-CM

## 2016-09-21 DIAGNOSIS — F988 Other specified behavioral and emotional disorders with onset usually occurring in childhood and adolescence: ICD-10-CM

## 2016-09-21 DIAGNOSIS — G4733 Obstructive sleep apnea (adult) (pediatric): ICD-10-CM

## 2016-09-21 DIAGNOSIS — R569 Unspecified convulsions: ICD-10-CM

## 2016-09-21 LAB — COMPREHENSIVE METABOLIC PANEL: Lab: 137 MMOL/L (ref 137–147)

## 2016-09-21 NOTE — Telephone Encounter
ED Discharge Follow Up  Reached patient: No; patient seen by PCP on 09/21/16.  Admission [facility] Name : [facility]  ED Admission Date: 09/20/16 ED Discharge Date: 09/20/16 Discharge Diagnosis:   Flank pain (Primary)   Nephrolithiasis   Hospital Services: Unplanned  Today's call is 1 (calendar) days post discharge    Scheduling Follow-up Appointment  Upcoming appointment date and time and with whom scheduled: Future Appointments  Date Time Provider [place]   09/29/2016 2:00 PM Coleman, Julien Nordmann [place], [place] [facility]   10/21/2016 10:30 AM Thomes Dinning., MD [facility]   12/21/2016 9:15 AM [facility]   12/21/2016 10:15 AM [place] Carbo, MD [facility]   01/21/2017 1:30 PM Thomes Dinning., MD [facility]     When was patients last PCP visit: 09/21/2016  PCP primary location: [facility]  PCP appointment scheduled? Yes, Date: seen 09/21/16   Specialist appointment scheduled? No  Both PCP and Specialist appointment scheduled: No  Is assistance with transportation needed? No

## 2016-09-25 NOTE — Progress Notes
Date of Service: 09/21/2016    Allen Gill is a 26 y.o. male.  DOB: 1990/02/06  MRN: [id number]     Subjective:             History of Present Illness  Patient returns for routine 38-month primary care follow-up.  His most recent problems have been almost entirely urologic.  He was recently in the emergency room for right flank pain with a largely negative workup.  He does have a known left large kidney stone and has had complex problems with his bladder.  He is currently scheduled for suprapubic catheter insertion by urology next month.  Since being in the ER he is apparently had near resolution of the discomfort and manages to continue to void spontaneously.  He is no longer able to self cath.  He has been found to have a cardiomyopathy that may be a result of his episode of severe urosepsis.  Cardiologist have discontinued his Adderall of concern for its impact on his remaining cardiac function.  He is currently under good control on carvedilol and lisinopril       Review of Systems   Constitutional: Negative for chills, fatigue, fever and unexpected weight change.   Respiratory: Negative for cough.    Cardiovascular: Negative for chest pain.   Gastrointestinal: Negative for constipation, diarrhea, nausea and vomiting.   Musculoskeletal: Negative for joint swelling.   Neurological: Negative for weakness and headaches.         Objective:         ??? carvedilol (COREG) 6.25 mg tablet Take 2 tablets by mouth twice daily. Take with food.   ??? dextroamphetamine-amphetamine (ADDERALL) 5 mg tablet Take 1 tablet by mouth twice daily for 30 days Earliest Fill Date: 09/23/16   ??? imipramine (TOFRANIL) 25 mg tablet TAKE 2 TABLETS BY MOUTH IN THE MORNING AND 1 TABLET IN THE EVENING   ??? levETIRAcetam (KEPPRA) 500 mg tablet Take 1 tablet by mouth twice daily.   ??? lisinopril (PRINIVIL; ZESTRIL) 5 mg tablet Take 1 tablet by mouth daily.   ??? MV,Ca,Min-Iron-FA-Lycopene (CENTRUM MEN) 8 mg iron- 200 mcg-600 mcg tab Take 1 Tab by mouth daily.   ??? oxybutynin chloride (DITROPAN) 5 mg tablet Take 1 tablet by mouth three times daily.   ??? oxyCODONE (ROXICODONE, OXY-IR) 5 mg tablet Take one tablet by mouth every 4 hours as needed for Pain for up to 4 doses   ??? polycarbophil (FIBERCON) 625 mg tablet Take 625 mg by mouth twice daily.   ??? spironolactone (ALDACTONE) 25 mg tablet Take 1 tablet by mouth daily. Take with food.     Vitals:    09/21/16 1103   BP: 118/64   Pulse: 108   SpO2: 97%   Weight: 99.8 kg (220 lb)     Body mass index is 37.76 kg/m???.     Physical Exam   Constitutional: He is oriented to person, place, and time. He appears well-developed and well-nourished.   Cardiovascular: Normal rate and regular rhythm.    Neurological: He is alert and oriented to person, place, and time.   Continues to walk with p.o. full braces.   Psychiatric: He has a normal mood and affect. His behavior is normal. Judgment and thought content normal.   Nursing note and vitals reviewed.           Assessment and Plan:  1. 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 Neurogenic bladder  3. Arnold-Chiari malformation (HCC)     4. S/P VP shunt     5. Kidney stone     6. Urinary retention     7. Dilated cardiomyopathy (HCC)       Plan:  Continue medicines as present  I have no additional recommendations as his PCP  He will return here following his urologic surgery on 10 3

## 2016-09-29 ENCOUNTER — Ambulatory Visit: Admit: 2016-09-29 | Discharge: 2016-09-30 | Payer: MEDICARE

## 2016-09-29 ENCOUNTER — Encounter: Admit: 2016-09-29 | Discharge: 2016-09-29 | Payer: MEDICARE

## 2016-09-29 DIAGNOSIS — R51 Headache: ICD-10-CM

## 2016-09-29 DIAGNOSIS — Q0700 Arnold-Chiari syndrome without spina bifida or hydrocephalus: ICD-10-CM

## 2016-09-29 DIAGNOSIS — F988 Other specified behavioral and emotional disorders with onset usually occurring in childhood and adolescence: ICD-10-CM

## 2016-09-29 DIAGNOSIS — N390 Urinary tract infection, site not specified: ICD-10-CM

## 2016-09-29 DIAGNOSIS — N319 Neuromuscular dysfunction of bladder, unspecified: ICD-10-CM

## 2016-09-29 DIAGNOSIS — T85618A Breakdown (mechanical) of other specified internal prosthetic devices, implants and grafts, initial encounter: ICD-10-CM

## 2016-09-29 DIAGNOSIS — Q059 Spina bifida, unspecified: Principal | ICD-10-CM

## 2016-09-29 DIAGNOSIS — G919 Hydrocephalus, unspecified: ICD-10-CM

## 2016-09-29 DIAGNOSIS — N200 Calculus of kidney: ICD-10-CM

## 2016-09-29 DIAGNOSIS — G4733 Obstructive sleep apnea (adult) (pediatric): ICD-10-CM

## 2016-09-29 DIAGNOSIS — R569 Unspecified convulsions: ICD-10-CM

## 2016-09-29 MED ORDER — CARVEDILOL 12.5 MG PO TAB
12.5 mg | ORAL_TABLET | Freq: Two times a day (BID) | ORAL | 3 refills | Status: SS
Start: 2016-09-29 — End: 2017-07-28

## 2016-09-29 MED ORDER — LISINOPRIL 5 MG PO TAB
5 mg | ORAL_TABLET | Freq: Every day | ORAL | 3 refills | Status: AC
Start: 2016-09-29 — End: 2017-04-19

## 2016-09-29 NOTE — Progress Notes
Date of Service: 09/29/2016    Allen Gill is a 26 y.o. male.       HPI     Allen Gill 26 y.o. pleasant Caucasian male with history of spina bifida, hydrocephalus and neurogenic bladder.  He was diagnosed with cardiomyopathy when he was septic in the ICU about 1-2 years ago.  At which time his ejection fraction was 25%.   ???  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  He is able to walk, and he walks about 4 blocks. He used to he get short of breath with walking more than 3-4 blocks, which relieves with rest and increase when he climbs more than 2 flights of stairs.    Since his last visit he has been feeling much better.  He denies any shortness of breath during this visit.  He also denies orthopnea, paroxysmal nocturnal dyspnea, chest pain, palpitations and lightheadedness.???He is having more energy compared to last visit.    Last visit I contacted his primary care  physician, Dr. Dorothey Baseman.  We discussed about the Aderrall is as a possible cause of cardiomyopathy.  We reached a consensus to stop the Adderall.  Then, he had a cardiac MRI that showed recovery of the ejection fraction to 60% by visual assessment and 84% calculated.  Also, he had normal RV function.  He had no left ventricular hypertrophy or any structural heart disease.  This MRI was done after stopping the Adderall and being on GDMT for heart failure.    Patient is scheduled for bladder surgery on October 3 of this year.      Patient has been compliant with his medications.      Social history: Patient lives with his mother and he works in [employer].  He denies smoking cigarettes, illicit drug use and drinking alcohol    Family history: Patient has no family history of heart failure         Vitals:    09/29/16 1357   BP: 122/88   Pulse: 96   Weight: 98.5 kg (217 lb 3.2 oz)   Height: 1.626 m (5' 4)     Body mass index is 37.28 kg/m???.     Past Medical History  Patient Active Problem List    Diagnosis Date Noted ??? Complex care coordination 09/14/2016     Priority: High   ??? Non-ischemic cardiomyopathy (HCC) 09/29/2016   ??? Tachycardia 07/30/2016   ??? Essential hypertension 07/30/2016   ??? Preop cardiovascular exam 07/30/2016   ??? Urinary retention 02/19/2016     Added automatically from request for surgery [id number]     ??? Hematuria 08/29/2015   ??? Urethral stone 07/31/2015     Added automatically from request for surgery [id number]     ??? Loculated pleural effusion 05/19/2015   ??? Morbid obesity (HCC) 05/19/2015   ??? Seizure disorder (HCC) 05/19/2015   ??? Transaminitis 05/19/2015   ??? Hydrocephalus 04/28/2015   ??? Shunt malfunction 06/07/2014   ??? Kidney stone 02/14/2012     [place] 12/27/11: The kidneys are normal in size. The right kidney measures 11.0 x 5.5 cm and contains a large calculus in the mid to inferior pelvis, measuring 4.8 cm x  0.6 cm x 1.5 cm. There is mild pelviectasis which fails to resolve on post void imaging. The left kidney measures 11.7 x 5.4 cm. No renal masses are identified.  02/28/12 CT showed right staghorn calculus and a small left upper pole stone.  04/07/12 right  PCNL - stable post op course for 2 days and discharged. Readmitted with hypotension and respiratory distress and acute drop in H/H. No PE and pleural effusions. No signs of bleeding/no perinephric bleeding. Sepsis w/u neg but placed on erapenum. Second look dealyed until 04/21/12 - successful and dishcaged to [facility]  06/09/12 Recovered fully and now for metabolic w/u               ??? 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 Arnold-Chiari malformation (HCC) 11/27/2009   ??? ADD (attention deficit disorder) 10/28/2006   ??? Spina bifida (HCC) 10/28/2006   ??? Neurogenic bladder 10/28/2006     Neurogenic bladder secondary to Spina Bifida  06/29/2012 - Doing well on Ditropan and Imipramine, continues to [facility] q3-4hrs     ??? Unspecified constipation 10/28/2006         Review of Systems   Constitution: Negative.   HENT: Negative.    Eyes: Negative.    Cardiovascular: Negative. Respiratory: Positive for cough.    Endocrine: Negative.    Hematologic/Lymphatic: Negative.    Skin: Negative.    Musculoskeletal: Positive for back pain.   Gastrointestinal: Positive for abdominal pain and flatus.   Genitourinary: Positive for flank pain.   Neurological: Positive for headaches.   Psychiatric/Behavioral: Negative.    Allergic/Immunologic: Negative.        Physical Exam   BP 122/88 (BP Source: Arm, Left Upper)  - Pulse 96  - Ht 1.626 m (5' 4)  - Wt 98.5 kg (217 lb 3.2 oz)  - BMI 37.28 kg/m???   Constitutional: He appears well-developed and well-nourished.   HENT:   Head: Atraumatic.   Neck: Neck supple. No JVD present.   Cardiovascular: Normal rate, regular rhythm and normal heart sounds.  Exam reveals no gallop and no friction rub.    No murmur heard.  Pulmonary/Chest: No respiratory distress. He has no wheezes. He has no rales.   Abdominal: There is no tenderness. There is no rebound.   Musculoskeletal: He exhibits deformity. He exhibits no edema or tenderness.   Neurological: He is alert and oriented to person, place, and time.   Skin: Skin is warm and dry.   Psychiatric: He has a normal mood and affect.   ???    Cardiovascular Studies    Cardiac MRI 08/2016:   1, Normal left ventricular size with normal systolic function. Calculated   EF=84%; however, visually, the LVEF appears closer to 60%.   2. Normal right ventricular size with normal systolic function. Calculated   EF=49%  3. No valvular abnormalities.  4. Normal cardiac chamber sizes  5. No pericardial effusion       Nuclear Stress Test 07/2016:   ???  This study is abnormal. ???Left ventricular systolic function is markedly reduced. ???There is diffuse left ventricular hypokinesis with near akinesis of the inferolateral segment. ???There does not appear to be evidence of significant active inducible ischemia. ???Other than the reduced ejection fraction no other negative prognostic indicators are present. ???Viability appears to be preserved throughout the myocardium. ???By report the patient had undergone previous study on 04/13/2012. ???Those images could not be retrieved for comparison. ???However on the prior study ejection fraction was reported as 56%.  ???  ???  Echo 07/30/2016  ???  1. ???Normal left ventricle size and wall thickness. ???Moderately depressed function with global hypokinesis and ejection fraction=30-35%. ???Mild diastolic dysfunction.  2. ???No mitral regurgitation.  3. ???Unable to measure pulmonary artery pressure.  4. ???Normal right ventricle  size and ejection fraction.  5. ???Normal central venous pressure.  6. ???Left ventricular function has improved slightly since 05/20/2015 echocardiogram.  Problems Addressed Today  Encounter Diagnoses   Name Primary?   ??? Non-ischemic cardiomyopathy (HCC) Yes   ??? Dilated cardiomyopathy (HCC)    ??? Essential hypertension        Assessment and Plan      Salim Alonge 26 y.o. is here for follow-up of nonischemic cardiomyopathy leading to heart failure with reduced ejection fraction that has recovered.  1.  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  ???Last visit patient was being optimized on guideline directed management.  While I was going over his medications I found out that he was on Adderall.  There are few case reports about Adderall induced cardiomyopathy.  I communicated with his primary care physician, Dr. Dorothey Baseman, and we stopped his Adderall.  Patient had a cardiac MRI thereafter and showed recovered ejection fraction which was calculated to be 84% and by visual assessment was 60%.  Symptom wise patient has been doing much better since he was started on guideline directed management and stop his Adderall ???I will continue his guideline directed management.  Continue lisinopril lisinopril 5 mg orally once daily, spironolactone 25 mg orally once daily, and increase carvedilol from 6.25-12.5 mg orally twice daily.  I will continue to titrate his carvedilol to blood pressure and heart rate.  ???I will stand echocardiogram and the patient in my clinic  ???I counseled the patient for healthy lifestyle      2.  Hypertension: Controlled  ???Continue lisinopril 5 mg orally once daily and increase Coreg from 6.25 mg orally twice daily to 12.5 mg orally twice daily as above    3.  Spina bifida and neurogenic bladder:  ???Patient will continue to follow-up with  urology.  There is no further cardiac testing needed before he proceeds with bladder surgery on 10/14/2011    4.  I counseled the patient for healthy lifestyle in terms of exercise and diet    I communicated with his primary care physician, Dr. Dorothey Baseman to update him about the plan and the results of the cardiac MRI.  I will follow-up in my clinic in 6 months with an echocardiogram.         Current Medications (including today's revisions)  ??? carvedilol (COREG) 12.5 mg tablet Take one tablet by mouth twice daily with meals. Take with food.   ??? imipramine (TOFRANIL) 25 mg tablet TAKE 2 TABLETS BY MOUTH IN THE MORNING AND 1 TABLET IN THE EVENING   ??? levETIRAcetam (KEPPRA) 500 mg tablet Take 1 tablet by mouth twice daily.   ??? lisinopril (PRINIVIL; ZESTRIL) 5 mg tablet Take one tablet by mouth daily.   ??? MV,Ca,Min-Iron-FA-Lycopene (CENTRUM MEN) 8 mg iron- 200 mcg-600 mcg tab Take 1 Tab by mouth daily.   ??? oxybutynin chloride (DITROPAN) 5 mg tablet Take 1 tablet by mouth three times daily.   ??? polycarbophil (FIBERCON) 625 mg tablet Take 625 mg by mouth twice daily.   ??? spironolactone (ALDACTONE) 25 mg tablet Take 1 tablet by mouth daily. Take with food.   Thank you for letting me apart of the patient's care,    Ardelle Anton. [name], MD, MS Advanced Heart Failure and Heart  Transplant Cardiologist  [facility]  [facility]  Pager:  [phone]

## 2016-09-30 DIAGNOSIS — I509 Heart failure, unspecified: Principal | ICD-10-CM

## 2016-09-30 DIAGNOSIS — I10 Essential (primary) hypertension: ICD-10-CM

## 2016-09-30 DIAGNOSIS — I420 Dilated cardiomyopathy: Secondary | ICD-10-CM

## 2016-09-30 DIAGNOSIS — I428 Other cardiomyopathies: ICD-10-CM

## 2016-09-30 DIAGNOSIS — Q059 Spina bifida, unspecified: ICD-10-CM

## 2016-10-09 ENCOUNTER — Encounter: Admit: 2016-10-09 | Discharge: 2016-10-09 | Payer: MEDICARE

## 2016-10-12 ENCOUNTER — Encounter: Admit: 2016-10-12 | Discharge: 2016-10-12 | Payer: MEDICARE

## 2016-10-12 DIAGNOSIS — G919 Hydrocephalus, unspecified: ICD-10-CM

## 2016-10-12 DIAGNOSIS — Q0700 Arnold-Chiari syndrome without spina bifida or hydrocephalus: ICD-10-CM

## 2016-10-12 DIAGNOSIS — T85618A Breakdown (mechanical) of other specified internal prosthetic devices, implants and grafts, initial encounter: ICD-10-CM

## 2016-10-12 DIAGNOSIS — G4733 Obstructive sleep apnea (adult) (pediatric): ICD-10-CM

## 2016-10-12 DIAGNOSIS — Q059 Spina bifida, unspecified: Secondary | ICD-10-CM

## 2016-10-12 DIAGNOSIS — N200 Calculus of kidney: ICD-10-CM

## 2016-10-12 DIAGNOSIS — N319 Neuromuscular dysfunction of bladder, unspecified: ICD-10-CM

## 2016-10-12 DIAGNOSIS — R51 Headache: ICD-10-CM

## 2016-10-12 DIAGNOSIS — R569 Unspecified convulsions: ICD-10-CM

## 2016-10-12 DIAGNOSIS — N390 Urinary tract infection, site not specified: ICD-10-CM

## 2016-10-12 DIAGNOSIS — F988 Other specified behavioral and emotional disorders with onset usually occurring in childhood and adolescence: ICD-10-CM

## 2016-10-12 MED ORDER — OXYBUTYNIN CHLORIDE 15 MG PO TR24
15 mg | ORAL_TABLET | Freq: Every day | ORAL | 0 refills
Start: 2016-10-12 — End: ?

## 2016-10-13 ENCOUNTER — Encounter: Admit: 2016-10-13 | Discharge: 2016-10-13 | Payer: MEDICARE

## 2016-10-13 ENCOUNTER — Inpatient Hospital Stay: Admit: 2016-10-13 | Discharge: 2016-10-13 | Payer: MEDICARE

## 2016-10-13 DIAGNOSIS — T85618A Breakdown (mechanical) of other specified internal prosthetic devices, implants and grafts, initial encounter: ICD-10-CM

## 2016-10-13 DIAGNOSIS — G919 Hydrocephalus, unspecified: ICD-10-CM

## 2016-10-13 DIAGNOSIS — Q0700 Arnold-Chiari syndrome without spina bifida or hydrocephalus: ICD-10-CM

## 2016-10-13 DIAGNOSIS — G4733 Obstructive sleep apnea (adult) (pediatric): ICD-10-CM

## 2016-10-13 DIAGNOSIS — R569 Unspecified convulsions: ICD-10-CM

## 2016-10-13 DIAGNOSIS — N200 Calculus of kidney: ICD-10-CM

## 2016-10-13 DIAGNOSIS — F988 Other specified behavioral and emotional disorders with onset usually occurring in childhood and adolescence: ICD-10-CM

## 2016-10-13 DIAGNOSIS — N319 Neuromuscular dysfunction of bladder, unspecified: ICD-10-CM

## 2016-10-13 DIAGNOSIS — R51 Headache: ICD-10-CM

## 2016-10-13 DIAGNOSIS — Q059 Spina bifida, unspecified: Principal | ICD-10-CM

## 2016-10-13 DIAGNOSIS — N390 Urinary tract infection, site not specified: ICD-10-CM

## 2016-10-13 LAB — BASIC METABOLIC PANEL
Lab: 0.9 mg/dL — ABNORMAL HIGH (ref 0.4–1.24)
Lab: 101 MMOL/L (ref 98–110)
Lab: 134 MMOL/L — ABNORMAL LOW (ref 137–147)
Lab: 151 mg/dL — ABNORMAL HIGH (ref 70–100)
Lab: 25 MMOL/L (ref 21–30)
Lab: 5.3 MMOL/L — ABNORMAL HIGH (ref 3.5–5.1)
Lab: 8 pg (ref 3–12)
Lab: 8.8 mg/dL (ref 8.5–10.6)

## 2016-10-13 LAB — CBC: Lab: 26 10*3/uL — ABNORMAL HIGH (ref 4.5–11.0)

## 2016-10-13 MED ORDER — IMIPRAMINE HCL 50 MG PO TAB
50 mg | Freq: Every day | ORAL | 0 refills | Status: DC
Start: 2016-10-13 — End: 2016-10-17
  Administered 2016-10-14 – 2016-10-17 (×4): 50 mg via ORAL

## 2016-10-13 MED ORDER — LACTATED RINGERS IV SOLP
INTRAVENOUS | 0 refills | Status: DC
Start: 2016-10-13 — End: 2016-10-14
  Administered 2016-10-13: 13:00:00 1000.000 mL via INTRAVENOUS

## 2016-10-13 MED ORDER — PHENYLEPHRINE IN 0.9% NACL(PF) 1 MG/10 ML (100 MCG/ML) IV SYRG
INTRAVENOUS | 0 refills | Status: DC
Start: 2016-10-13 — End: 2016-10-13
  Administered 2016-10-13 (×10): 100 ug via INTRAVENOUS
  Administered 2016-10-13: 13:00:00 200 ug via INTRAVENOUS

## 2016-10-13 MED ORDER — LEVETIRACETAM 500 MG PO TAB
500 mg | Freq: Two times a day (BID) | ORAL | 0 refills | Status: DC
Start: 2016-10-13 — End: 2016-10-17
  Administered 2016-10-14 – 2016-10-17 (×8): 500 mg via ORAL

## 2016-10-13 MED ORDER — CARVEDILOL 12.5 MG PO TAB
12.5 mg | Freq: Two times a day (BID) | ORAL | 0 refills | Status: DC
Start: 2016-10-13 — End: 2016-10-17
  Administered 2016-10-13 – 2016-10-17 (×8): 12.5 mg via ORAL

## 2016-10-13 MED ORDER — FENTANYL CITRATE (PF) 50 MCG/ML IJ SOLN
25-50 ug | INTRAVENOUS | 0 refills | Status: DC | PRN
Start: 2016-10-13 — End: 2016-10-16

## 2016-10-13 MED ORDER — HYDROMORPHONE 2 MG/ML IJ SOLN
0 refills | Status: DC
Start: 2016-10-13 — End: 2016-10-13
  Administered 2016-10-13 (×4): 0.5 mg via INTRAVENOUS

## 2016-10-13 MED ORDER — OXYCODONE 5 MG PO TAB
5-10 mg | Freq: Once | ORAL | 0 refills | Status: CP | PRN
Start: 2016-10-13 — End: ?
  Administered 2016-10-13: 19:00:00 10 mg via ORAL

## 2016-10-13 MED ORDER — HALOPERIDOL LACTATE 5 MG/ML IJ SOLN
1 mg | Freq: Once | INTRAVENOUS | 0 refills | Status: DC | PRN
Start: 2016-10-13 — End: 2016-10-13

## 2016-10-13 MED ORDER — ALVIMOPAN 12 MG PO CAP
12 mg | Freq: Two times a day (BID) | ORAL | 0 refills | Status: DC
Start: 2016-10-13 — End: 2016-10-16
  Administered 2016-10-14 – 2016-10-16 (×4): 12 mg via ORAL

## 2016-10-13 MED ORDER — FENTANYL CITRATE (PF) 50 MCG/ML IJ SOLN
25-50 ug | INTRAVENOUS | 0 refills | Status: DC | PRN
Start: 2016-10-13 — End: 2016-10-13
  Administered 2016-10-13: 19:00:00 50 ug via INTRAVENOUS

## 2016-10-13 MED ORDER — CEFOXITIN INJ 2GM IVP
2 g | INTRAVENOUS | 0 refills | Status: DC
Start: 2016-10-13 — End: 2016-10-13

## 2016-10-13 MED ORDER — ONDANSETRON HCL (PF) 4 MG/2 ML IJ SOLN
4-8 mg | INTRAVENOUS | 0 refills | Status: DC | PRN
Start: 2016-10-13 — End: 2016-10-17
  Administered 2016-10-17: 13:00:00 4 mg via INTRAVENOUS

## 2016-10-13 MED ORDER — DEXTRAN 70-HYPROMELLOSE (PF) 0.1-0.3 % OP DPET
0 refills | Status: DC
Start: 2016-10-13 — End: 2016-10-13
  Administered 2016-10-13: 13:00:00 1 [drp] via OPHTHALMIC

## 2016-10-13 MED ORDER — CEFOXITIN INJ 1GM IVP
1 g | INTRAVENOUS | 0 refills | Status: CP
Start: 2016-10-13 — End: ?
  Administered 2016-10-13 – 2016-10-14 (×4): 1 g via INTRAVENOUS

## 2016-10-13 MED ORDER — LIDOCAINE (PF) 10 MG/ML (1 %) IJ SOLN
.1-2 mL | INTRAMUSCULAR | 0 refills | Status: DC | PRN
Start: 2016-10-13 — End: 2016-10-13

## 2016-10-13 MED ORDER — PSYLLIUM SEED (ASPARTAME) PO POWD
3.4 g | Freq: Every day | ORAL | 0 refills | Status: DC
Start: 2016-10-13 — End: 2016-10-13

## 2016-10-13 MED ORDER — SENNOSIDES-DOCUSATE SODIUM 8.6-50 MG PO TAB
1 | Freq: Two times a day (BID) | ORAL | 0 refills | Status: DC
Start: 2016-10-13 — End: 2016-10-17
  Administered 2016-10-14 – 2016-10-17 (×7): 1 via ORAL

## 2016-10-13 MED ORDER — IMIPRAMINE HCL 25 MG PO TAB
25 mg | Freq: Every day | ORAL | 0 refills | Status: DC
Start: 2016-10-13 — End: 2016-10-14

## 2016-10-13 MED ORDER — BISACODYL 10 MG RE SUPP
10 mg | Freq: Every day | RECTAL | 0 refills | Status: DC
Start: 2016-10-13 — End: 2016-10-17
  Administered 2016-10-15: 15:00:00 10 mg via RECTAL

## 2016-10-13 MED ORDER — ALVIMOPAN 12 MG PO CAP
12 mg | Freq: Once | ORAL | 0 refills | Status: CP
Start: 2016-10-13 — End: ?
  Administered 2016-10-13: 13:00:00 12 mg via ORAL

## 2016-10-13 MED ORDER — ACETAMINOPHEN 500 MG PO TAB
1000 mg | Freq: Once | ORAL | 0 refills | Status: CP
Start: 2016-10-13 — End: ?
  Administered 2016-10-13: 13:00:00 1000 mg via ORAL

## 2016-10-13 MED ORDER — FENTANYL CITRATE (PF) 50 MCG/ML IJ SOLN
0 refills | Status: DC
Start: 2016-10-13 — End: 2016-10-13
  Administered 2016-10-13 (×2): 25 ug via INTRAVENOUS
  Administered 2016-10-13: 14:00:00 50 ug via INTRAVENOUS
  Administered 2016-10-13: 13:00:00 100 ug via INTRAVENOUS
  Administered 2016-10-13: 15:00:00 75 ug via INTRAVENOUS
  Administered 2016-10-13 (×3): 25 ug via INTRAVENOUS
  Administered 2016-10-13: 18:00:00 50 ug via INTRAVENOUS

## 2016-10-13 MED ORDER — ACETAMINOPHEN 325 MG PO TAB
650 mg | Freq: Four times a day (QID) | ORAL | 0 refills | Status: DC
Start: 2016-10-13 — End: 2016-10-17
  Administered 2016-10-13 – 2016-10-17 (×11): 650 mg via ORAL

## 2016-10-13 MED ORDER — HYDROMORPHONE (PF) 2 MG/ML IJ SYRG
.5-1 mg | INTRAVENOUS | 0 refills | Status: DC | PRN
Start: 2016-10-13 — End: 2016-10-13

## 2016-10-13 MED ORDER — LACTATED RINGERS IV SOLP
250 mL | INTRAVENOUS | 0 refills | Status: DC
Start: 2016-10-13 — End: 2016-10-13

## 2016-10-13 MED ORDER — HEPARIN, PORCINE (PF) 5,000 UNIT/0.5 ML IJ SYRG
5000 [IU] | Freq: Once | SUBCUTANEOUS | 0 refills | Status: CP
Start: 2016-10-13 — End: ?
  Administered 2016-10-13: 13:00:00 5000 [IU] via SUBCUTANEOUS

## 2016-10-13 MED ORDER — ELECTROLYTE-A IV SOLP
0 refills | Status: DC
Start: 2016-10-13 — End: 2016-10-13
  Administered 2016-10-13 (×2): via INTRAVENOUS

## 2016-10-13 MED ORDER — BUPIVACAINE LIPOSOMAL/BUPIVACAINE HCL INJECTION
Freq: Once | 0 refills | Status: CP
Start: 2016-10-13 — End: ?
  Administered 2016-10-13 (×2): 30 mL

## 2016-10-13 MED ORDER — OXYCODONE 5 MG PO TAB
5-15 mg | ORAL | 0 refills | Status: DC | PRN
Start: 2016-10-13 — End: 2016-10-17
  Administered 2016-10-14: 05:00:00 10 mg via ORAL
  Administered 2016-10-14: 01:00:00 5 mg via ORAL
  Administered 2016-10-14 – 2016-10-15 (×3): 10 mg via ORAL

## 2016-10-13 MED ORDER — CEFOXITIN INJ 2GM IVP
2 g | Freq: Once | INTRAVENOUS | 0 refills | Status: DC
Start: 2016-10-13 — End: 2016-10-13

## 2016-10-13 MED ORDER — PROPOFOL INJ 10 MG/ML IV VIAL
0 refills | Status: DC
Start: 2016-10-13 — End: 2016-10-13
  Administered 2016-10-13: 13:00:00 120 mg via INTRAVENOUS

## 2016-10-13 MED ORDER — IMIPRAMINE HCL 25 MG PO TAB
25 mg | Freq: Every evening | ORAL | 0 refills | Status: DC
Start: 2016-10-13 — End: 2016-10-17
  Administered 2016-10-15 – 2016-10-17 (×3): 25 mg via ORAL

## 2016-10-13 MED ORDER — MIDAZOLAM 1 MG/ML IJ SOLN
INTRAVENOUS | 0 refills | Status: DC
Start: 2016-10-13 — End: 2016-10-13
  Administered 2016-10-13: 13:00:00 2 mg via INTRAVENOUS

## 2016-10-13 MED ORDER — DEXAMETHASONE SODIUM PHOSPHATE 4 MG/ML IJ SOLN
INTRAVENOUS | 0 refills | Status: DC
Start: 2016-10-13 — End: 2016-10-13
  Administered 2016-10-13: 13:00:00 4 mg via INTRAVENOUS

## 2016-10-13 MED ORDER — HEPARIN, PORCINE (PF) 5,000 UNIT/0.5 ML IJ SYRG
5000 [IU] | SUBCUTANEOUS | 0 refills | Status: DC
Start: 2016-10-13 — End: 2016-10-17
  Administered 2016-10-13 – 2016-10-17 (×11): 5000 [IU] via SUBCUTANEOUS

## 2016-10-13 MED ORDER — OXYCODONE SR 10 MG PO 12HR TABLET
10 mg | Freq: Once | ORAL | 0 refills | Status: DC
Start: 2016-10-13 — End: 2016-10-13

## 2016-10-13 MED ORDER — PROMETHAZINE 25 MG/ML IJ SOLN
6.25 mg | INTRAVENOUS | 0 refills | Status: DC | PRN
Start: 2016-10-13 — End: 2016-10-13

## 2016-10-13 MED ORDER — FENTANYL CITRATE (PF) 50 MCG/ML IJ SOLN
12.5-25 ug | INTRAVENOUS | 0 refills | Status: DC | PRN
Start: 2016-10-13 — End: 2016-10-13
  Administered 2016-10-13: 22:00:00 25 ug via INTRAVENOUS

## 2016-10-13 MED ORDER — LIDOCAINE (PF) 200 MG/10 ML (2 %) IJ SYRG
0 refills | Status: DC
Start: 2016-10-13 — End: 2016-10-13
  Administered 2016-10-13: 13:00:00 100 mg via INTRAVENOUS

## 2016-10-13 MED ORDER — CEFAZOLIN 1 GRAM IJ SOLR
0 refills | Status: DC
Start: 2016-10-13 — End: 2016-10-13
  Administered 2016-10-13: 13:00:00 2 g via INTRAVENOUS

## 2016-10-13 MED ORDER — DIPHENHYDRAMINE HCL 50 MG/ML IJ SOLN
25 mg | Freq: Once | INTRAVENOUS | 0 refills | Status: DC | PRN
Start: 2016-10-13 — End: 2016-10-13

## 2016-10-13 MED ORDER — BUPIVACAINE LIPOSOMAL/BUPIVACAINE HCL INJECTION
Freq: Once | 0 refills | Status: DC
Start: 2016-10-13 — End: 2016-10-13

## 2016-10-13 MED ORDER — SUGAMMADEX 100 MG/ML IV SOLN
INTRAVENOUS | 0 refills | Status: DC
Start: 2016-10-13 — End: 2016-10-13
  Administered 2016-10-13: 17:00:00 200 mg via INTRAVENOUS

## 2016-10-13 MED ORDER — ROCURONIUM 10 MG/ML IV SOLN
INTRAVENOUS | 0 refills | Status: DC
Start: 2016-10-13 — End: 2016-10-13
  Administered 2016-10-13: 14:00:00 20 mg via INTRAVENOUS
  Administered 2016-10-13: 14:00:00 10 mg via INTRAVENOUS
  Administered 2016-10-13 (×2): 20 mg via INTRAVENOUS
  Administered 2016-10-13: 13:00:00 50 mg via INTRAVENOUS
  Administered 2016-10-13 (×2): 20 mg via INTRAVENOUS

## 2016-10-13 MED ORDER — CEFOXITIN 2 GRAM IV SOLR
0 refills | Status: DC
Start: 2016-10-13 — End: 2016-10-13
  Administered 2016-10-13 (×3): 2 g via INTRAVENOUS

## 2016-10-13 MED ORDER — SODIUM CHLORIDE 0.9 % IV SOLP
INTRAVENOUS | 0 refills | Status: DC
Start: 2016-10-13 — End: 2016-10-15
  Administered 2016-10-13 – 2016-10-15 (×5): 1000.000 mL via INTRAVENOUS

## 2016-10-13 NOTE — Progress Notes
Wound Ostomy Nursing Consult Service    NAME:Allen Gill                                                                   MRN: [id number]                 DOB:1990-12-06          AGE: 26 y.o.  ADMISSION DATE: 10/13/2016             DAYS ADMITTED: LOS: 0 days      Reason for Visit: ostomy marking    Planned Procedure: Simple cystectomy with ileal conduit diversion with appendectomy  Surgeon: Wyre    Pt with hx of spina bifida but reports he ambulates and is not wheelchair bound. Abdomen remains relatively flat in all positions - pt does have creased r/t scarring from previous procedures. These sites were identified with marking pen.     Sites x 4 marked - one marking in each quadrant. Dr. Oren Bracket present in room at end of marking - concerns and recommended sites discussed.     Will continue to follow.    Marcelino Duster, RN, BSN, [employer] Nursing Consult Service  Office: [phone]  Pager: [phone]  Wound/Ostomy Team Pager (After Hours/Weekends): [phone]  Marcelino Duster, RN, BSN, [employer] Nursing Consult Service  Office: [phone]  Pager: [phone]  [employer] Team Pager (After Hours/Weekends): [phone]

## 2016-10-13 NOTE — Operative Report(Direct Entry)
OPERATIVE REPORT    Name: Allen Gill is a 26 y.o. male     DOB: [birth year]             MRN#: [id number]    DATE OF OPERATION: 10/13/2016    Surgeon(s) and Role:     * Glennie Isle, MD - Primary     * Junius Roads, MD - Resident - Assisting        Preoperative Diagnosis:    Neurogenic bladder [N31.9]    Post-op Diagnosis      * Neurogenic bladder [N31.9]    Procedure(s):  FERTILITY PRESERVING SIMPLE CYSTECTOMY, CREATION OF ILEAL CONDUIT URINARY DIVERSION    Anesthesia Type: General        Description and Findings of Operative Procedure:     The patient was identified, and informed consent was obtained. The patient was brought to operating room and placed on the table in the supine position. Sequential compression devices were operational on the bilateral lower extremities. IV antibiotics were administered prior to the smooth induction of general anesthesia. A time-out was performed per protocol. The abdomen and genitals were prepped and draped in the usual sterile fashion.  A 16 French silicone catheter was passed but resistance was met near the bladder neck, so the catheter was removed.      An inferior umbilical incision was made sharply.  The dissection was carried down through the subcutaneous tissue and fascia with the Bovie electrocautery unit.  The Space of Retzius was entered and developed. The inferior apex of the fascia was easily visible, and so a PDS suture was pre-placed to assist with fascial closure at the conclusion of the case.  The peritoneum was entered, and the urachus was divided using LigaSure device. 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  The median umbilical ligaments were followed in a diagonal fashion caudad and lateral, and the peritoneal attachments of the bladder were divided.  The vasa deferentia were spared bilaterally for fertility preservation. The Bookwalter was then placed in the wound for retraction.  The peritoneum was scored and dissected off the posterior bladder and rectum separated away.  Care was taken to avoid vasal injury during this dissection, with the vasa dropped down with the peritoneum.  The Ligasure device was then used to divide the superior pedicles consisting of obliterated umbilicals and superior vesical artery bilaterally.  The ureters were also divided at this point.  The bladder was then excised off its base and passed off the field.  The posterior mucosa was lifted off the underlying detrusor.  The residual mucosa was then cauterized.  The bladder neck was then closed by simple interrupted sutures reapproximating the anterior cuff to the posterior cuff.  The peritoneal plane was then followed to find the ureters, again with the vasa deferentia being spared. The left ureter was isolated and then dissected free towards the pelvis.  The ureter was then ligated at its distal portion.  A silk stitch was placed on the ureter at the 12:00 position for later identification.  A similar procedure was performed on the right.       Attention was then turned to the urinary diversion.  The left ureter was tunneled under the sigmoid mesentery and brought to the right side.  Approximately 20 cm proximal to the ileocecal valve, the bowel was divided using the GIA stapler with a load.  The mesentery was divided using the LigaSure device.  A 15cm segment of  normal appearing bowel was then measured, the bowel divided using the GIA stapler, and the mesentery divided using the LigaSure.  The two remaining ends of the bowel were aligned side to side with interrupted 3-0 silk sutures.  The ends were then amputated, and the GIA fired to create a side-to-side anastomosis.  The anastomosis was completed with a TA stapler.  The staple line was buried using interrupted 3-0 silk sutures.  The mesenteric trap was closed using 3-0 silk suture.  The distal (stomal) end of the ileal conduit was then excised.  It was copiously irrigated. An enterotomy was made on the butt end of the ileal conduit on the left.  The left ureter was spatulated and then anastomosed to the enterotomy using 4-0 Monocryl sutures.  Prior to completion of the anastomosis, a single J urinary diversion stent was passed up the ureter and brought out through the end of the conduit. A similar anasatomosis was completed on the right.  Each anastomosis was tested after completion and noted to be water tight.  The stents were then tacked to the end of conduit with a 3-0 chromic suture.  The pelvis was again copiously irrigated.    A quarter size segment of skin was removed from the right anterior abdominal wall and the underlying fat was excised.  A cruciate incision was then made in the fascia and two Vicryl sutures were pre-placed in the fascia.  A linear incision was made in the posterior sheath.  This was then dilated to two fingerbreadths.  The conduit was then brought through this, with passage through the rectus abdominus muscle, with care taken to maintain a straight mesentery. The pre-placed fascial sutures were then also put through the conduit at the level of the fascia.  A rosebud was created by using interrupted 2-0 Vicryl sutures from the skin, deep to the bowel serosa, to the edge of bowel.  The stoma was matured using interrupted 3-0 Vicryl sutures from the skin to the edge of the bowel.  A 19 Blake drain was brought out through the left side.  The fascia was then closed using two running number 1 PDS suture.  The subcutaneous tissues were copiously irrigated and closed with 3-0 Vicryl, and the skin was closed with staples. The ostomy appliance was applied followed by a midline dressing.  The drain was secured and a dressing was applied.    The patient tolerated the procedure well. The patient was awoken from anesthesia and transported to PACU in stable condition.              Estimated Blood Loss:  300 ml Specimen(s) Removed/Disposition:   ID Type Source Tests Collected by Time Destination   1 : URINARY BLADDER FOR ROUTINE Tissue Urinary Bladder SURGICAL PATHOLOGY          Glennie Isle, MD 10/13/2016 [phone]

## 2016-10-13 NOTE — Anesthesia Post-Procedure Evaluation
Post-Anesthesia Evaluation    Name: Allen Gill      MRN: [id number]     DOB: [birth year]     Age: 26 y.o.     Sex: male   __________________________________________________________________________     Procedure Date: 10/13/2016  Procedure: Procedure(s) with comments:  CYSTECTOMY, ILEAL CONDUIT - CASE LENGTH 5.5 HOURS      Surgeon: Surgeon(s):  Holzbeierlein, Farris Has, MD  Glennie Isle, MD  Glennie Isle, MD  Junius Roads, MD    Post-Anesthesia Vitals  BP: 150/116 (10/03 1330)  Pulse: 127 (10/03 1330)  Respirations: 14 PER MINUTE (10/03 1330)  SpO2: 96 % (10/03 1330)  O2 Delivery: Nasal Cannula (10/03 1330)  SpO2 Pulse: 127 (10/03 1330)      Post Anesthesia Evaluation Note    Evaluation location: Pre/Post  Patient participation: recovered; patient participated in evaluation  Level of consciousness: alert    Pain score: 3  Pain management: adequate    Hydration: normovolemia  Temperature: 36.0C - 38.4C  Airway patency: adequate    Regional/Neuraxial:       Neurological status: sensory deficit (TAPs with Exparel performed)      Single injection shot performed    Perioperative Events  Perioperative events:  no       Post-op nausea and vomiting: no PONV    Postoperative Status  Cardiovascular status: hemodynamically stable  Respiratory status: spontaneous ventilation and supplemental oxygen (2L NC)  Follow-up needed: none        Perioperative Events  Perioperative Event: No  Emergency Case Activation: No

## 2016-10-13 NOTE — Progress Notes
Pt arrived to unit via cart. Pt pulled from cart to bed. Orders released, reviewed, and implemented as appropriate. Oriented to surroundings, call light within reach.  POC reviewed. Will continue to monitor.      Patient arrived to room # (811) via cart accompanied by transport. Patient transferred to the bed with assistance. Bedside safety checks completed. Initial patient assessment completed, refer to flowsheet for details. Admission skin assessment completed by: Billey Chang RN, Naomi, PCA    Pressure Injury Present on Hospital Admission (within 24 hours): Yes    1. Occiput: No  2. Ear: No  3. Scapula: No  4. Spinous Process: No  5. Shoulder: No  6. Elbow: No  7. Iliac Crest: No  8. Sacrum/Coccyx: No  9. Ischial Tuberosity: No  10. Trochanter: No  11. Knee: No  12. Malleolus: No  13. Heel: No  14. Toes: No  15. Assessed for device associated injury Yes  16. Nursing Nutrition Assessment Completed Yes    See Doc Flowsheet for additional wound details.     INTERVENTIONS:

## 2016-10-13 NOTE — Anesthesia Procedure Notes
Anesthesia Procedure: Peripheral Nerve Block    PERIPHERAL NERVE BLOCK  Date/Time: 10/13/2016 7:40 AM    Patient location: pre-op  Reason for block: at surgeon's request and post-op pain management    Preprocedure checklist performed: 2 patient identifiers, risks & benefits discussed, patient evaluated, timeout performed, consent obtained, patient being monitored and sterile drape    Sterile technique:  - Proper hand washing  - Cap, mask  - Sterile gloves  - Skin prep for antisepsis        Peripheral Nerve Block Procedure   Patient position: supine  Prep: ChloraPrep    Monitoring: BP, EKG and continuous pulse ox  Block type: TAPs  Laterality: bilateral  Injection technique: single-shot  Procedures: ultrasound guided    Ultrasound image captured  Local infiltration: lidocaine    Needle/cathether:      Needle type: Stimuplex      Needle gauge: 22 G; Needle length: 4 in     Needle location: anatomical landmarks and ultrasound guidance    Procedure Outcome   Injection assessment: negative aspiration for heme, no paresthesia on injection, incremental injection and local visualized surrounding nerve on ultrasound  Observations: adequate block, patient sedated but conversant throughout block, patient tolerated the procedure well with no immediate complications and comfortable throughout block      Refer to nursing documentation for vitals and monitoring data during procedure.    Performed by: Awilda Metro  Authorized by: Amanda Cockayne

## 2016-10-13 NOTE — Other
Brief Operative Note    Name: Ferguson Rensing is a 26 y.o. male     DOB: 05-Feb-1990             MRN#: [id number]  DATE OF OPERATION: 10/13/2016    Date:  10/13/2016        Preoperative Dx:   Neurogenic bladder [N31.9]    Post-op Diagnosis      * Neurogenic bladder [N31.9]    Procedure(s):  CYSTECTOMY, ILEAL CONDUIT    Anesthesia Type: General    Surgeon(s) and Role:     Junius Roads, MD - Resident - Assisting     * Holzbeierlein, Farris Has, MD - Co-Surgeon     Glennie Isle, MD - Primary        Findings:  Bladder neck closed after bladder removed; prostate left in situ; bilateral vasa spared for fertility preservation    Estimated Blood Loss: 300 ml    Specimen(s) Removed/Disposition:   ID Type Source Tests Collected by Time Destination   1 : URINARY BLADDER FOR ROUTINE Tissue Urinary Bladder SURGICAL PATHOLOGY          Glennie Isle, MD 10/13/2016 0910        Complications:  None    Implants: None    Drains: ileal conduit with bilateral externalized ureteral stents; JPx1    Disposition:  PACU - stable    Junius Roads, MD  Pager [phone]

## 2016-10-13 NOTE — Progress Notes
RT Adult Assessment Note    NAME:Allen Gill             MRN: [id number]             DOB:06-28-1990          AGE: 26 y.o.  ADMISSION DATE: 10/13/2016             DAYS ADMITTED: LOS: 0 days    RT Treatment Plan:       Protocol Plan: Procedures  IPPB: Place a nursing order for "IS Q1h While Awake" for any of Lung Expansion indicators  Oxygen/Humidity: O2 to keep SpO2 > 92%  Monitoring: Pulse oximetry BID & PRN    Additional Comments:  Impressions of the patient: Patient sitting in bed with wet wash cloth across forehead. Alert and orientated.  Intervention(s)/outcome(s): Oxygen check, IS therapy  Patient education that was completed: none  Recommendations to the care team: none    Vital Signs:  Pulse:   RR: Respirations: 16 PER MINUTE  SpO2: SpO2: 93 %  O2 Device:    Liter Flow:    O2%: O2 Percent: 21 %  Breath Sounds:    Respiratory Effort: Respiratory Effort: Non-Labored

## 2016-10-14 LAB — CBC
Lab: 15 K/UL — ABNORMAL HIGH (ref 4.5–11.0)
Lab: 15 g/dL — ABNORMAL HIGH (ref 13.5–16.5)

## 2016-10-14 LAB — MAGNESIUM: Lab: 2 mg/dL (ref 1.6–2.6)

## 2016-10-14 LAB — BASIC METABOLIC PANEL: Lab: 136 MMOL/L — ABNORMAL LOW (ref 137–147)

## 2016-10-14 MED ORDER — CALCIUM CARBONATE 200 MG CALCIUM (500 MG) PO CHEW
500-1000 mg | ORAL | 0 refills | Status: DC | PRN
Start: 2016-10-14 — End: 2016-10-17
  Administered 2016-10-14: 11:00:00 1000 mg via ORAL

## 2016-10-14 MED ORDER — ONDANSETRON HCL (PF) 4 MG/2 ML IJ SOLN
INTRAVENOUS | 0 refills | Status: DC
Start: 2016-10-14 — End: 2016-10-14
  Administered 2016-10-13: 18:00:00 4 mg via INTRAVENOUS

## 2016-10-14 MED ORDER — CALCIUM CARBONATE 200 MG CALCIUM (500 MG) PO CHEW
500 mg | ORAL | 0 refills | Status: DC | PRN
Start: 2016-10-14 — End: 2016-10-14
  Administered 2016-10-14: 08:00:00 500 mg via ORAL

## 2016-10-14 MED ORDER — MAGNESIUM HYDROXIDE 2,400 MG/10 ML PO SUSP
10 mL | Freq: Two times a day (BID) | ORAL | 0 refills | Status: DC
Start: 2016-10-14 — End: 2016-10-17
  Administered 2016-10-14 – 2016-10-15 (×3): 10 mL via ORAL

## 2016-10-14 MED ORDER — SODIUM CHLORIDE 0.9 % IV SOLP
500 mL | INTRAVENOUS | 0 refills | Status: CP
Start: 2016-10-14 — End: ?
  Administered 2016-10-14: 22:00:00 500 mL via INTRAVENOUS

## 2016-10-14 MED ORDER — SODIUM CHLORIDE 0.9 % IV SOLP
1000 mL | INTRAVENOUS | 0 refills | Status: CP
Start: 2016-10-14 — End: ?
  Administered 2016-10-14: 11:00:00 1000 mL via INTRAVENOUS

## 2016-10-14 MED ORDER — SIMETHICONE 80 MG PO CHEW
80 mg | ORAL | 0 refills | Status: DC | PRN
Start: 2016-10-14 — End: 2016-10-17
  Administered 2016-10-14: 20:00:00 80 mg via ORAL

## 2016-10-14 NOTE — Progress Notes
OCCUPATIONAL THERAPY  ASSESSMENT NOTE    Patient Name: Allen Gill                   Room/Bed: WU981/19  Admitting Diagnosis: Neurogenic bladder [N31.9]    Past Medical History:   Diagnosis Date   ??? ADD (attention deficit disorder)     without hyperactivity   ??? Arnold-Chiari malformation (HCC)    ??? Headache(784.0)    ??? Hydrocephalus    ??? Infection of VP shunt    ??? Kidney stones     frequent   ??? Myelomeningocele (HCC)    ??? Neurogenic bladder    ??? OSA (obstructive sleep apnea)    ??? 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 Shunt malfunction    ??? Spina bifida (HCC)    ??? Urinary tract infection     frequent     Mobility  Progressive Mobility Level: Active transfer to chair  Level of Assistance: Assist X1  Assistive Device:  (IV)  Time Tolerated: 31-60 minutes  Activity Limited By: Fatigue;Pain    Subjective  Pertinent Dx per Physician: PMH of spina bifida, neurogenic bladder???s/p cystectomy, ileal conduit 10/3  R LE Precautions: RLE AFO: Ankle Foot Orthosis  L LE Precautions: LLE AFO: Ankle Foot Orthosis  Pain / Complaints: Patient agrees to participate in therapy  Pain Location: Incisional  Pain Level Current: 6 Severe pain    Objective  Psychosocial Status: Willing and Cooperative to Participate    Home Living  Comment: Patient lives in an apartment by himself. Once discharged from the hospital, patient will stay with his parents in a house. No stairs to enter, tub/shower with bench if needed and standard height toilet.    Prior Function  Level Of Independence: Independent with ADLs and functional transfers;Independent with homemaking w/ ambulation  Lives With: Alone  Other Function Comments: Patient's parents will be available to assist with ADLs and IADLs.     ADL's  Where Assessed:  (BSC)  LE Dressing Assist: Maximum Assist  LE Dressing Deficits: Don/Doff R Sock;Don/Doff L Sock;Don/Doff R Shoe;Don/Doff L Shoe  Functional Transfer Assist: Minimal Assist Functional Transfer Deficits: Verbal Cueing;Increased Time to Complete  Comment: Patient required minimal assist for supine to sit transfer with HOB elevated. Elevated bed to stand with minimal assist. Transferred to [facility] with stand by assist. Transferred from [facility] to recliner with stand by assist and management of lines.    Activity Tolerance  Sitting Balance: 3+/5 Sits w/o UE Support for 30 Seconds or Greater    Cognition  Overall Cognitive Status: WFL to Adequately Complete Self Care Tasks Safely    Education  Persons Educated: Patient  Barriers To Learning: None Noted  Teaching Methods: Verbal Instruction  Patient Response: Verbalized Understanding  Topics: Role of OT, Goals for Therapy  Goal Formulation: With Patient    Assessment  Assessment: Decreased ADL Status;Decreased Endurance;Decreased Self-Care Trans;Decreased High-Level ADLs  Prognosis: Good  Goal Formulation: Patient  Comments: Patient is making progress towards goals. Anticipate he will continue to progress as pain is well managed and he continues to mobilize.    AM-PAC 6 Clicks Daily Activity Inpatient  Putting on and taking off regular lower body clothes?: Total  Bathing (Including washing, rinsing, drying): A Lot  Toileting, which includes using toilet, bedpan, or urinal: Total  Putting on and taking off regular upper body clothing: A Little  Taking care of personal grooming such as brushing teeth: A Little  Eating meals?: None  Daily Activity Raw Score: 14  Standardized (t-scale) score: 33.39  CMS 0-100% Score: 59.67  CMS G Code Modifier: CK    G-Codes: Self-care  J1884 Current Status:  40-59% Impairment  G8988 Goal Status:  20-39% Impairment  Based on above evaluation and clinical judgment.    Plan   Progress: Progressing Toward Goals  OT Frequency: 5x/week  OT Plan for Next Visit: Progress transfers and activity tolerance. Work on LE dressing as able.    ADL Goals  Patient Will Perform Grooming: in Chair;w/ Stand By Assist Patient Will Perform LE Dressing: w/ Moderate Assist  Patient Will Perform Toileting: w/ Moderate Assist    Functional Transfer Goals  Pt Will Perform All Functional Transfers: w/ Stand By Assist    OT Discharge Recommendations  OT Discharge Recommendations: Home with family assist. Anticipate discharge in 4-5 days and clinical judgment indicates patient will be able to return home with parents at that time.   Equipment Recommendations: Patient owns necessary equipment    Therapist: Azucena Kuba, OTR/L  Date: 10/14/2016

## 2016-10-14 NOTE — Anesthesia Pain Rounding
Anesthesia Follow-Up Evaluation: Post-Procedure Day One    Name: Allen Gill     MRN: [id number]     DOB: October 25, 1990     Age: 26 y.o.     Sex: male   __________________________________________________________________________     Procedure Date: 10/13/2016   Procedure: Procedure(s) with comments:  CYSTECTOMY, ILEAL CONDUIT - CASE LENGTH 5.5 HOURS    Physical Assessment     Weight: 97.5 kg (215 lb)    Vital Signs (Last Filed in 24 hours)  BP: 133/77 (10/04 0800)  Temp: 36.9 ???C (98.4 ???F) (10/04 0800)  Pulse: 112 (10/04 0814)  Respirations: 18 PER MINUTE (10/04 0814)  SpO2: 98 % (10/04 0814)  O2 Delivery: Nasal Cannula (10/04 0800)  SpO2 Pulse: 123 (10/03 1400)    Patient History   Allergies  Allergies   Allergen Reactions   ??? Latex RASH and SHORTNESS OF BREATH   ??? Amoxicillin RASH and STOMACH UPSET   ??? Ceclor [Cefaclor] HIVES   ??? Clindamycin HIVES   ??? Zosyn [Piperacillin-Tazobactam] HIVES        Medications  Scheduled Meds:  acetaminophen (TYLENOL) tablet 650 mg 650 mg Oral QID   alvimopan (ENTEREG) capsule 12 mg 12 mg Oral BID   bisacodyl (DULCOLAX) rectal suppository 10 mg 10 mg Rectal QDAY   carvedilol (COREG) tablet 12.5 mg 12.5 mg Oral BID w/meals   cefOXitin (MEFOXIN) IVP 1 g 1 g Intravenous Q6H*   heparin (porcine) PF syringe 5,000 Units 5,000 Units Subcutaneous Q8H   imipramine (TOFRANIL) tablet 25 mg 25 mg Oral QHS   imipramine (TOFRANIL) tablet 50 mg 50 mg Oral QDAY   levETIRAcetam (KEPPRA) tablet 500 mg 500 mg Oral BID   milk of magnesia (CONC) oral suspension 10 mL 10 mL Oral BID   senna/docusate (SENOKOT-S) tablet 1 tablet 1 tablet Oral BID   Continuous Infusions:  ??? sodium chloride 0.9 %   infusion 100 mL/hr at 10/14/16 0602     PRN and Respiratory Meds:calcium carbonate Q6H PRN, fentaNYL citrate PF Q2H PRN, ondansetron (ZOFRAN) IV Q6H PRN, oxyCODONE Q4H PRN      Diagnostic Tests  Hematology: Lab Results   Component Value Date    HGB 15.1 10/14/2016    HCT 45.9 10/14/2016    PLTCT 374 10/14/2016 WBC 15.5 10/14/2016    NEUT 69 09/20/2016    ANC 5.50 09/20/2016    ALC 1.80 09/20/2016    MONA 8 09/20/2016    AMC 0.70 09/20/2016    EOSA 1 09/20/2016    ABC 0.00 09/20/2016    MCV 78.1 10/14/2016    MCH 25.6 10/14/2016    MCHC 32.8 10/14/2016    MPV 9.0 10/14/2016    RDW 19.4 10/14/2016         General Chemistry: Lab Results   Component Value Date    NA 136 10/14/2016    K 5.0 10/14/2016    CL 102 10/14/2016    CO2 28 10/14/2016    GAP 6 10/14/2016    BUN 14 10/14/2016    CR 1.07 10/14/2016    GLU 155 10/14/2016    GLU 73 08/30/2016    CA 8.7 10/14/2016    ALBUMIN 4.5 09/21/2016    LACTIC 2.4 08/29/2015    MG 2.0 10/14/2016    TOTBILI 0.2 09/21/2016    PO4 5.5 08/29/2015      Coagulation:   Lab Results   Component Value Date    PT 13.2 03/06/2002    PTT 26.6  08/29/2015    INR 1.1 08/29/2015         Follow-Up Assessment  Patient location during evaluation: floor      Anesthetic Complications:   Anesthetic complications: The patient did not experience any anesthestic complications.      Pain:  Score: 2    Management:adequate     Level of Consciousness: awake and alert   Hydration:acceptable     Airway Patency: patent   Respiratory Status: acceptable     Cardiovascular Status:acceptable   Regional/Neuroaxial:

## 2016-10-14 NOTE — Progress Notes
This RN notified team of pt HR in the 120's upon arrival to the unit. No orders followed. Will continue to monitor.

## 2016-10-14 NOTE — Case Management (ED)
Case Management Admission Assessment    NAME:Allen Gill                          MRN: [id number]             DOB:[birth year]          AGE: 26 y.o.  ADMISSION DATE: 10/13/2016             DAYS ADMITTED: LOS: 1 day      Today???s Date: 10/14/2016    Source of Information: NCM reviewed EMR and discussed in interdisciplinary huddle.   NCM met with patient at bedside to discuss discharge planning.  Pt is POD#1 from cystectomy with ileal conduit.  Pt would like HH from [facility]  NCM sent initial referrals.       Plan  Plan: Assist PRN with SW/NCM Services, Discharge Planning for Home Anticipated    Patient Address/Phone  [address]  [place] [address]  [phone] (home) [phone] (work)    Emergency Contact  Extended Emergency Contact Information  Primary Emergency Contact: Carelock,Ann  Address: [address], [place] [address] [employer]  Home Phone: [phone]  Mobile Phone: [phone]  Relation: Mother  Secondary Emergency Contact: Foree,Terry  Address: [address] [place], [place] [address] [place]  Home Phone: [phone]  Mobile Phone: [phone]  Relation: Father    Healthcare Directive         Transportation  Does the patient need discharge transport arranged?: No  Transportation Name, Phone and Availability #1: parents Dewayne Hatch or Hartley Barefoot [phone], [phone]  Does the patient use Medicaid Transportation?: No    Expected Discharge Date  Expected Discharge Date: 10/17/16    Living Situation Prior to Admission  ? Living Arrangements  Type of Residence: Home, dependent on others  Living Arrangements: Parent  Financial risk analyst / Tub: Walk-in Shower  ? Level of Function   Prior level of function: Needs assist with ADLs  Which ADLs require assistance?: transportation. pt has learned to care for self in most of his ADL's and has lived alone  Who assists with ADLs?: parents  ? Cognitive Abilities Cognitive Abilities: Alert and Oriented, Engages in problem solving and planning, Understands nature of health condition    Financial Resources  ? Coverage  Primary Insurance: Medicare  Secondary Insurance: Medicaid  Additional Coverage: RX    ? Source of Income   Source Of Income: Employed  ? Financial Assistance Needed?  no    Psychosocial Needs  ? Mental Health  Mental Health History: No (denies)  ? Substance Use History  Substance Use History Screen: No (denies)  ? Other  no    Current/Previous Services  ? PCP  Annice Pih., [phone], [phone]  ? Pharmacy    [id number] [place], [place] - [address] AT [facility] [address]  [address]  [place] [address]  Phone: [phone] Fax: [phone]    [id number] - [place], [place] - [address] AT [facility] OF [address]  [address]  [place]  [address]  Phone: [phone] Fax: [phone]    ? Durable Medical Equipment   Durable Medical Equipment at home: Krystal Clark (leg braces)  ? Home Health  Receiving home  health: In the past  Agency name: [facility]  Would patient use this agency again?: Yes  ? Hemodialysis or Peritoneal Dialysis  Undergoing hemodialysis or peritoneal dialysis: No  ? Tube/Enteral Feeds  Receive tube/enteral feeds: No  ? Infusion  Receive infusions: No  ? Private Duty  Private duty help used: No  ? Home and Community Based Services  Home and community based services: No  ? [employer]: N/A  ? Hospice  Hospice: No  ? Outpatient Therapy  PT: No  OT: No  SLP: No  ? Skilled Nursing Facility/Nursing Home  SNF: No  NH: No  ? Inpatient Rehab  IPR: No  ? Long-Term Acute Care Hospital  LTACH: No  ? Acute Hospital Stay  Acute Hospital Stay: In the past  Was patient's stay within the last 30 days?: No      Kaleen Odea, RN, BSN, [place]  [phone]  [phone]

## 2016-10-14 NOTE — Progress Notes
Reason for Visit: chaplain rounds    Faith/Religion: [employer]    Support System:   Family/dad    Interventions/Plan: Chaplain visited and interacted with the patient. The patient has difficulty talking. Chaplain encouraged offered prayers silently and left.    The spiritual care team is available as needed, 24/7, through the campus switchboard [phone]).  For immediate response, please page [phone].  For a response within 24 hours, please submit an order in O2 for a chaplain consult or call the administrative voicemail at [phone].

## 2016-10-14 NOTE — Progress Notes
Urology Progress Note 10/14/2016     ASSESSMENT:  Allen Gill is a 26 y.o. Male with spina bifida, neurogenic bladder s/p Procedure(s) (LRB):  CYSTECTOMY, ILEAL CONDUIT (N/A) on 10/13/2016.  LOS: 1 day     PLAN: Will discuss plan with staff surgeon - Dr. Glennie Isle, MD    - Pain control: PO pain with IV breakthrough  - Diet/FEN: 8 oz CLD q8H, 1 L bolus, maintain 125 ml/hr IVF  - GI: Bowel regimen, zofran prn  - GU: Ileal conduit stoma education, maintain stents; Cr 1.07  - Heme/ID: Afebrile. Hgb, WBC appropriate  - OOB/Amb, PT/OT  - Dispo: Continue inpatient care on post-cystectomy pathway    Prophylaxis Review:  -  DVT: Heparin, SCD's  -  GI: No  -  Catheter: No  -  Abx: Yes - Mefoxin --> Keflex BID    Janell Quiet, MD  Urology PGY-1  Please page urology on call with questions  ________________________________________________________________________   SUBJECTIVE:   No acute events overnight. Pain is moderately well-controlled. Patient denies nausea, denies vomiting, is not ambulating. Patient has not passed flatus, has not had a bowel movement. Patient is tolerating Diet NPO.    OBJECTIVE:                   Vital Signs: Most Recent                Vital Signs: Past 24 Hours   BP: 124/95 (10/04 0314)  Temp: 37.1 ???C (98.7 ???F) (10/04 0865)  Pulse: 124 (10/04 0314)  Respirations: 17 PER MINUTE (10/04 0314)  SpO2: 96 % (10/04 0314)  O2 Delivery: Nasal Cannula (10/04 0314)  Weight: 97.5 kg (215 lb) (10/03 0737)  BP: (120-150)/(80-116)   Temp:  [36.6 ???C (97.9 ???F)-37.3 ???C (99.1 ???F)]   Pulse:  [95-129]   Respirations:  [14 PER MINUTE-17 PER MINUTE]   SpO2:  [88 %-99 %]   O2 Delivery: Nasal Cannula     Physical Exam:  General: Alert & oriented, no acute distress  Pulm: Equal chest rise, non-labored  CV: Regular rate and rhythm  Extremities: No edema, SCD's in place  Abd: Soft, appropriately tender to palpation, mildly distended, JP SS  Incisions/Wounds: Appropriately tender to palpation. Midline incision C/D/I with dressing with mild SS drainage. Stoma pink and patent with two visible externalized stents draining clear cherry urine into bag    Intake/Output:    Date 10/13/16 0701 - 10/14/16 0700 10/14/16 0701 - 10/15/16 0700   Shift 0701-1900 1901-0700 24 Hour Total 0701-1900 1901-0700 24 Hour Total   I  N  T  A  K  E   I.V.  (mL/kg/hr) 2100  (1.8) 531.7 2631.7       Shift Total  (mL/kg) 2100  (21.5) 531.7  (5.5) 2631.7  (27)      O  U  T  P  U  T   Urine  (mL/kg/hr) 100  (0.1) 150 250         Urine Output (ml) (Ileal Conduit 10/13/16 1208 Right;Upper) 100 150 250       Drains 65 70 135         Drain Output (ml) Al Pimple Drain 10/13/16 1145 Left Abdomen) 65 70 135       Other 300  300         Estimated Blood Loss 300  300       Shift Total  (mL/kg) 465  (4.8) 220  (2.3)  685  (7)      NET 1635 311.7 1946.7      Weight (kg) 97.5 97.5 97.5 97.5 97.5 97.5          Labs:                     Hematology                               Chemistry   Recent Labs      10/14/16   0451   WBC  15.5*   HGB  15.1   PLTCT  374    Recent Labs      10/13/16   1302   NA  134*   K  5.3*   CL  101   CO2  25   BUN  14   CR  0.91   GFR  >60   GLU  151*   CA  8.8        ACTIVE PROBLEMS:  Principal Problem:    Neurogenic bladder

## 2016-10-14 NOTE — Progress Notes
Marya Landry, MD notified of pt complaints of heart burn. New orders received for Tums.

## 2016-10-14 NOTE — Consults
Wound Ostomy Note    NAME:Allen Gill                                                                   MRN: [id number]                 DOB:Feb 24, 1990          AGE: 26 y.o.  ADMISSION DATE: 10/13/2016             DAYS ADMITTED: LOS: 1 day      Reason for Consult/Visit: ostomy education    Assessment/Plan:    Principal Problem:    Neurogenic bladder    s/p cystectomy, ileal conduit 10/3    Ileal Conduit 10/13/16 1208 Right;Upper (Active)   10/13/16 1208 Right;Upper   Stoma Assessment Red; Slightly protrudes 10/14/2016 11:00 AM   Peristomal Skin Assessment Intact 10/14/2016 11:00 AM   Dressing Status Clean;Dry;Intact 10/14/2016 11:00 AM   Stoma Miscellaneous Stents in Place 10/14/2016 11:00 AM   Urine Color Pink 10/14/2016 11:00 AM   Urine Appearance Cloudy 10/14/2016 11:00 AM   Urine Output (ml) 50 milliliters 10/14/2016 11:00 AM     Patient sitting in bedside chair. Father at bedside. Patient interactive with staff. Patient and father both states that the patient is very independent and will manage stoma/pouch management when discharged to father's home. Mother unable to attend education moments during ostomy team availability.   OR pouch (One piece flat w/ window) on patient at this time. Small loosening/buckling of inner wafer near stoma. Pouch dry, intact without signs of skin breakdown.    Ostomy nurse demonstrated how to empty pouch. Topics discussed: frequency of emptying/changing pouch, how to empty/change pouch, stoma/peristomal skin care, pouch options, how to cut pouch, how to obtain pouches, [employer], Resources available, how to use night drainage bag. Questions answered to the patient's and father's satisfaction.  Mock stoma and supplies given to patient to practice cutting skin barrier hole and placing new pouch. Patient states he has watched some videos and practice pouch cutting and care prior to hospital stay. Pouch system left disconnected from night drainage bag. Encouraged patient to actively participate in pouch emptying.   Planned pouch change by patient Friday am using [employer] One Piece Convex pouch.   Discussed plan of pouch care with bedside nurse.     RECOMMEND:   OSTOMY CARE:  1.?????? Change pouch with any sign of leaking. Do not reinforce leaking pouch with tape  2.?????? Clean skin with water only - no soap or wipes  3.?????? If skin is broken down surrounding stoma, sprinkle stoma powder and wipe away the excess  4.??? Spray no sting skin prep on powdered skin and let dry completely  5.??? Cut new pouch leaving 1/8-1/4 of skin showing between the stoma and pouch  6.??? Warm new pouch in palm of hands  7.??? Ensure skin is dry and apply new pouch  8.??? Lay warm hand over pouch once it's applied  ???  **Bedside RN is responsible for continuing ostomy education in between visits from our service. This can be achieved by including patient (and family) in ostomy care tasks such as pouch emptying and pouch changes.**    - Change pouch immediately when leak is identified.  Do not reinforce leaking pouch with tape as this will lead to breakdown of skin around stoma and can interfere with future pouching.  - Clean stoma and skin around stoma with water and washcloth. Do not use soap, wet wipes, bath wipes, alcohol or ointments as this will interfere with pouch seal to skin.  - Cut pouch to fit 1/8-1/4 inch larger than stoma. A thin sliver of skin should be visible between pouch edge and base of stoma.  - Ensure skin around stoma is dry then apply pouch to abdomen.  - Empty pouch when no more than 1/3 full. ???  - Please use wet wipe to clean out drainable tail of pouch prior to rolling up and securing closed to prevent residue and odor.     Will continue to follow.    Bettey Mare RN, BSN  Wound/ Ostomy Nursing Consult Service  Office: [phone]  Pager: [phone]  Wound/ Ostomy Team pager (after hours/ weekends): [phone]

## 2016-10-14 NOTE — Progress Notes
PHYSICAL THERAPY  ASSESSMENT     MOBILITY:  Mobility  Progressive Mobility Level: Walk in room  Distance Walked (feet): 8 ft  Level of Assistance: Assist X1  Assistive Device: Walker  Time Tolerated: 11-30 minutes  Activity Limited By: Pain;Patient request to stop    SUBJECTIVE:  Subjective  Significant hospital events: Dx: neurogenic bladder, s/p cystectomy with ileal conduit on 10/3  Mental / Cognitive Status: Alert;Oriented;Cooperative;Follows Commands  Pain: Patient complains of pain;Before activity;6/10;After activity;8/10  Pain Location: Abdomen;Post-surgical  Pain Interventions: Patient agrees to participate in therapy with modifications to session;Treatment altered to patient's pain tolerance;Patient assisted into position of comfort  Comments: PMH: ADD, Arnold-Chiari malformation, hydrocephalus, VP shunt, VP shunt infection, spina bifida, back surgery  R LE Precautions: RLE AFO: Ankle Foot Orthosis  L LE Precautions: LLE AFO: Ankle Foot Orthosis  Ambulation Assist: Independent Mobility in Community with Device  Patient Owned Equipment: Teacher, adult education (B AFOs)  Home Situation: Lives with Family  Type of Home: House  Entry Stairs: No Stairs  In-Home Stairs: Able to Live on One Level  Comments: Pt. plans to stay with parents at discharge; typically lives alone in an apartment    ROM:  ROM  ROM Position Assessed: Seated  ROM Method: Active  LE ROM: Bilateral;Knee;WFL  ROM Comments: B ankle ROM limited by AFOs    STRENGTH:  Strength  Strength Unable To Assess: Due to Pain/Surgery  Strength Comment: Pt. has sufficient LE strength to transfer and walk with min A    POSTURE/NEURO:  Posture / Neurological  Head Control: Independent  Posture: Rounded Shoulders;Obesity    BED MOBILITY/TRANSFERS:  Bed Mobility/Transfers  Comments: Bed mobility not assessed; pt. already up in chair  Transfer Type: Sit to Stand  Transfer: Assistance Level: From;Bed Side Chair;Minimal Assist Transfer: Assistive Device: Nurse, adult  Transfers: Type Of Assistance: For Balance;For Strength Deficit;For Safety Considerations  Other Transfer Type: Stand to Sit  Other Transfer: Assistance Level: To;Bed Side Chair;Standby Assist  Other Transfer: Assistive Device: Nurse, adult  Other Transfer: Type Of Assistance: Verbal Cues;For Safety Considerations  End Of Activity Status: Up in Chair;Nursing Notified;Instructed Patient to Request Assist with Mobility;Instructed Patient to Use Call Light  Comments: Pt. needs cues for foot and hand placement for transfers    BALANCE:  Balance  Sitting Balance: Static Sitting Balance;No UE Support;Independent  Standing Balance: Dynamic Standing Balance;2 UE support;Minimal Assist    GAIT:  Gait  Gait Distance: 8 feet  Gait: Assistance Level: Minimal Assist  Gait: Assistive Device: Roller Walker  Gait: Descriptors: Pace: Fut[employer] trader;No balance loss;Decreased step length  Activity Limited By: Complaint of Pain;Patient Choice    EDUCATION:  Education  Persons Educated: Patient  Patient Barriers To Learning: None Noted  Teaching Methods: Verbal Instruction;Demonstration  Patient Response: Verbalized Understanding;Return Demonstration  Topics: Plan/Goals of PT Interventions;Use of Assistive Device/Orthosis;Mobility Progression;Up with Assist Only;Importance of Increasing Activity;Ambulate With Nursing;Positioning;Recommend Continued Therapy;Therapy Schedule  Comments: Pt. encouraged to walk with nursing staff and RW    ASSESSMENT/PROGRESS:  Assessment/Progress  Impaired Mobility Due To: Pain;Decreased Activity Tolerance;Post Surgical Changes  Assessment/Progress: Should Improve w/ Continued PT  AM-PAC 6 Clicks Basic Mobility Inpatient  Turning from your back to your side while in a flat bed without using bed rails: None  Moving from lying on your back to sitting on the side of a flatbed without using bedrails : A Little Moving to and from a bed to a chair (including a wheelchair): A Little  Standing up from  a chair using your arms (e.g. wheelchair, or bedside chair): A Little  To walk in hospital room: A Little  Climbing 3-5 steps with a railing: A Lot  Raw Score: 18  Standardized (T-scale) Score: 41.05  Basic Mobility CMS 0-100%: 40.47  CMS G Code Modifier for Basic Mobility: CK    GOALS:  Goals  Goal Formulation: With Patient  Time For Goal Achievement: 5 days  Pt Will Go Supine To/From Sit: w/ Stand By Assist  Pt Will Transfer Bed/Chair: w/ Stand By Assist  Pt Will Transfer Sit to Stand: Independently  Pt Will Ambulate: Greater than 200 Feet, w/ Walker, w/ Stand By Assist    PLAN:  Plan   Treatment Interventions: Mobility Training;Balance Activities;Endurance Training  Plan Frequency: 5-7 Days per Week  PT Plan for Next Visit: Increase gait distance, increase independence with transfers, assess bed mobility    RECOMMENDATIONS:  PT Discharge Recommendations  PT Discharge Recommendations: Home with Assistance;and;Home Health Setting  Equipment Recommendations: Patient owns necessary equipment  Recommend ongoing assistance for: In and out of house;Transfers;Ambulation    Therapist: Aida Puffer, PT  Date: Nov 04, 2016    G-Codes: Mobility  [phone] Current Status:  40-59% impairment  [id number] Goal Status:  1-19% impairment    Based on above evaluation and clinical judgment.

## 2016-10-15 ENCOUNTER — Encounter: Admit: 2016-10-15 | Discharge: 2016-10-15 | Payer: MEDICARE

## 2016-10-15 DIAGNOSIS — Q059 Spina bifida, unspecified: Principal | ICD-10-CM

## 2016-10-15 DIAGNOSIS — N200 Calculus of kidney: ICD-10-CM

## 2016-10-15 DIAGNOSIS — N319 Neuromuscular dysfunction of bladder, unspecified: ICD-10-CM

## 2016-10-15 DIAGNOSIS — G919 Hydrocephalus, unspecified: ICD-10-CM

## 2016-10-15 DIAGNOSIS — F988 Other specified behavioral and emotional disorders with onset usually occurring in childhood and adolescence: ICD-10-CM

## 2016-10-15 DIAGNOSIS — G4733 Obstructive sleep apnea (adult) (pediatric): ICD-10-CM

## 2016-10-15 DIAGNOSIS — Q0700 Arnold-Chiari syndrome without spina bifida or hydrocephalus: ICD-10-CM

## 2016-10-15 DIAGNOSIS — N390 Urinary tract infection, site not specified: ICD-10-CM

## 2016-10-15 DIAGNOSIS — R51 Headache: ICD-10-CM

## 2016-10-15 DIAGNOSIS — T85618A Breakdown (mechanical) of other specified internal prosthetic devices, implants and grafts, initial encounter: ICD-10-CM

## 2016-10-15 DIAGNOSIS — R569 Unspecified convulsions: ICD-10-CM

## 2016-10-15 LAB — MAGNESIUM: Lab: 2 mg/dL (ref 1.6–2.6)

## 2016-10-15 LAB — PHOSPHORUS: Lab: 2.9 mg/dL (ref 2.0–4.5)

## 2016-10-15 LAB — BASIC METABOLIC PANEL: Lab: 135 MMOL/L — ABNORMAL LOW (ref 137–147)

## 2016-10-15 MED ORDER — KETOROLAC 15 MG/ML IJ SOLN
15 mg | INTRAVENOUS | 0 refills | Status: AC
Start: 2016-10-15 — End: ?
  Administered 2016-10-15 – 2016-10-17 (×6): 15 mg via INTRAVENOUS

## 2016-10-15 MED ORDER — DEXTROSE 5%-0.45% SODIUM CHLORIDE IV SOLP
INTRAVENOUS | 0 refills | Status: AC
Start: 2016-10-15 — End: ?
  Administered 2016-10-15 – 2016-10-17 (×4): 1000.000 mL via INTRAVENOUS

## 2016-10-15 MED ORDER — SODIUM CHLORIDE 0.9 % IV SOLP
1000 mL | INTRAVENOUS | 0 refills | Status: CP
Start: 2016-10-15 — End: ?
  Administered 2016-10-15: 12:00:00 1000 mL via INTRAVENOUS

## 2016-10-15 MED ORDER — CEPHALEXIN 500 MG PO CAP
500 mg | Freq: Two times a day (BID) | ORAL | 0 refills | Status: DC
Start: 2016-10-15 — End: 2016-10-17
  Administered 2016-10-15 – 2016-10-17 (×5): 500 mg via ORAL

## 2016-10-15 NOTE — Progress Notes
Urology Progress Note 10/15/2016     ASSESSMENT:  Allen Gill is a 26 y.o. Male with spina bifida, neurogenic bladder s/p Procedure(s) (LRB):  CYSTECTOMY, ILEAL CONDUIT (N/A) on 10/13/2016.  LOS: 2 days     PLAN: Will discuss plan with staff surgeon - Dr. Oren Bracket    - Pain control: PO pain with IV breakthrough  - Diet/FEN: CLD, s/p 2L bolus 10/4, maintain 100 ml/hr IVF  - GI: Bowel regimen, zofran prn, Entereg until ROBF  - GU: Ileal conduit stoma education, maintain stents; awaiting Cr  - Heme/ID: Afebrile. Hgb, WBC appropriate, Keflex  - OOB/Amb, PT/OT  - Dispo: Continue inpatient care on post-cystectomy pathway    Prophylaxis Review:  -  DVT: Heparin, SCD's  -  GI: No  -  Catheter: No  -  Abx: Yes - Mefoxin --> Keflex BID    Minerva Ends, MD  Urology PGY-2  Please page urology on call with questions  ________________________________________________________________________   SUBJECTIVE:   No acute events overnight. Pain is well-controlled. Patient is tolerating 8oz every 8 hours without nausea or vomiting. He is not ambulating. Patient has not passed flatus, Has not had a bowel movement.     OBJECTIVE:                   Vital Signs: Most Recent                Vital Signs: Past 24 Hours   BP: 125/87 (10/05 0329)  Temp: 36.8 ???C (98.3 ???F) (10/05 0329)  Pulse: 117 (10/05 0329)  Respirations: 16 PER MINUTE (10/05 0329)  SpO2: 98 % (10/05 0329)  O2 Delivery: Nasal Cannula (10/05 0329)  Height: 162.6 cm (64.02) (10/04 1415)  Weight: 97.5 kg (215 lb) (10/04 1415)  BP: (125-133)/(57-92)   Temp:  [36.3 ???C (97.4 ???F)-36.9 ???C (98.4 ???F)]   Pulse:  [111-120]   Respirations:  [12 PER MINUTE-18 PER MINUTE]   SpO2:  [95 %-99 %]   O2 Delivery: Nasal Cannula     Physical Exam:  General: Alert & oriented, no acute distress  Pulm: Equal chest rise, non-labored  CV: Regular rate and rhythm  Extremities: No edema, SCD's in place  Abd: Soft, appropriately tender to palpation, mildly distended, JP SS Incisions/Wounds: Appropriately tender to palpation. Midline incision stapled, C/D/I. Stoma pink and patent with two visible externalized stents draining clear cherry urine into bag    Intake/Output:    Date 10/14/16 0701 - 10/15/16 0700 10/15/16 0701 - 10/16/16 0700   Shift 0701-1900 1901-0700 24 Hour Total 0701-1900 1901-0700 24 Hour Total   I  N  T  A  K  E   P.O.  480 480       I.V.  (mL/kg/hr)  700 700       Shift Total  (mL/kg)  1180  (12.1) 1180  (12.1)      O  U  T  P  U  T   Urine  (mL/kg/hr) 300  (0.3) 225 525         Urine Output (ml) (Ileal Conduit 10/13/16 1208 Right;Upper) 300 225 525       Drains 100 200 300         Drain Output (ml) Al Pimple Drain 10/13/16 1145 Left Abdomen) 100 200 300       Shift Total  (mL/kg) 400  (4.1) 425  (4.4) 825  (8.5)      NET -400 755 355  Weight (kg) 97.5 97.5 97.5 97.5 97.5 97.5          Labs:                     Hematology                               Chemistry   Recent Labs      10/14/16   0451   WBC  15.5*   HGB  15.1   PLTCT  374    Recent Labs      10/14/16   0451   NA  136*   K  5.0   CL  102   CO2  28   BUN  14   CR  1.07   GFR  >60   GLU  155*   CA  8.7        ACTIVE PROBLEMS:  Principal Problem:    Neurogenic bladder

## 2016-10-15 NOTE — Case Management (ED)
Case Management Progress Note    NAME:Allen Gill                          MRN: [id number]              DOB:[birth year]          AGE: 26 y.o.  ADMISSION DATE: 10/13/2016             DAYS ADMITTED: LOS: 2 days      Today???s Date: 10/15/2016                                                                     Nurse Case Manager Weekend Needs      Instructions for CM W/E Staff:  Please call [facility] and fax AVS at discharge.    Teaching Needs Prior to DC:  Ostomy care    Expected delivery of any needed medication/supplies/equipment:  n/a    Agency Expected [facility] and Services Planned:  Start of care Monday    Plan    NCM reviewed EMR and discussed in interdisciplinary huddle.   Anticipate discharge home this weekend with [facility].    Interventions  ? Support   Support: Pt/Family Updates re:POC or DC Plan, Patient Education  ? Info or Referral   Information or Referral to Community Resources: No Needs Identified  ? Discharge Planning   Discharge Planning: Home Health  ? Medication Needs   Medication Needs: No Needs Identified  ? Financial   Financial: No Needs Identified  ? Legal   Legal: No Needs Identified  ? Other        Disposition  ? Expected Discharge Date    Expected Discharge Date: 10/17/16  ? Transportation   Does the patient need discharge transport arranged?: No  Transportation Name, Phone and Availability #1: parents Dewayne Hatch or Hartley Barefoot [phone], [phone]  Does the patient use Medicaid Transportation?: No  ? Next Level of Care (Acute Psych discharges only)      ? Discharge Disposition                                          Durable Medical Equipment     No service has been selected for the patient.      [place] Destination     No service has been selected for the patient.      [facility] - Selection Complete     Service Request Status Selected Specialties Address Phone Number Fax Number    [facility] Selected Home Health Services [address] [place] [address], [place] [address] [phone]      [facility]     No service has been selected for the patient.            Kaleen Odea, RN, BSN, [place]  [phone]  [phone]

## 2016-10-15 NOTE — Progress Notes
OCCUPATIONAL THERAPY  PROGRESS NOTE    Patient Name: Allen Gill                   Room/Bed: UJ811/91  Admitting Diagnosis: Neurogenic bladder [N31.9]    Past Medical History:   Diagnosis Date   ??? ADD (attention deficit disorder)     without hyperactivity   ??? Arnold-Chiari malformation (HCC)    ??? Headache(784.0)    ??? Hydrocephalus    ??? Infection of VP shunt    ??? Kidney stones     frequent   ??? Myelomeningocele (HCC)    ??? Neurogenic bladder    ??? OSA (obstructive sleep apnea)    ??? 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 Shunt malfunction    ??? Spina bifida (HCC)    ??? Urinary tract infection     frequent     Mobility  Progressive Mobility Level: Walk in room  Distance Walked (feet): 15 ft  Level of Assistance: Stand by assistance  Assistive Device: Walker  Time Tolerated: 0-10 minutes  Activity Limited By: Fatigue    Subjective  Pertinent Dx per Physician: PMH of spina bifida, neurogenic bladder???s/p cystectomy, ileal conduit 10/3  Precautions: O2 Requirement  R LE Precautions: RLE AFO: Ankle Foot Orthosis  L LE Precautions: LLE AFO: Ankle Foot Orthosis  Pain / Complaints: Patient agrees to participate in therapy;Patient demonstrates no signs of pain    Objective  Psychosocial Status: Willing and Cooperative to Participate  Persons Present: Brother    Home Living  Type of Home: House  Comment: Patient lives in an apartment by himself. Once discharged from the hospital, patient will stay with his parents in a house. No stairs to enter, tub/shower with bench if needed and standard height toilet.    Prior Function  Level Of Independence: Independent with ADLs and functional transfers;Independent with homemaking w/ ambulation  Lives With: Alone  Other Function Comments: Patient's parents will be available to assist with ADLs and IADLs.    ADL's  Where Assessed: Standing at [employer] Assist: Stand By Assist  Grooming Deficits: Increased Time To Complete;Standing With Assistive Device Functional Transfer Assist: Stand By Assist  Functional Transfer Deficits: Supervision/Safety;Increased Time to Complete  Comment: Patient in chair upon arrival. Patient performed sit/stand transfers, ambulation to sink and standing at sink for grooming with stand by assist and management of lines. Tolerated standing for 3 minutes.    Cognition  Overall Cognitive Status: WFL to Adequately Complete Self Care Tasks Safely    Assessment  Assessment: Decreased ADL Status;Decreased Endurance;Decreased High-Level ADLs  Prognosis: Good  Goal Formulation: Patient  Comments: With patient's pain well controlled, he is able to tolerate increased activity. Anticipate patient will continue to progress with additional mobility and will be able to return home with family assist.    AM-PAC 6 Clicks Daily Activity Inpatient  Putting on and taking off regular lower body clothes?: Total  Bathing (Including washing, rinsing, drying): A Lot  Toileting, which includes using toilet, bedpan, or urinal: A Lot  Putting on and taking off regular upper body clothing: A Little  Taking care of personal grooming such as brushing teeth: None  Eating meals?: None  Daily Activity Raw Score: 16  Standardized (t-scale) score: 35.96  CMS 0-100% Score: 53.32  CMS G Code Modifier: CK    Plan   Progress: Progressing Toward Goals  OT Frequency: 5x/week  OT Plan for Next Visit: Progress  transfers and activity tolerance. Work on LE dressing as able.    ADL Goals  Patient Will Perform Grooming: Standing at Sink;w/ Mod Independent  Patient Will Perform LE Dressing: w/ Moderate Assist  Patient Will Perform Toileting: w/ Moderate Assist    Functional Transfer Goals  Pt Will Perform All Functional Transfers: w/ Stand By Assist    OT Discharge Recommendations  OT Discharge Recommendations: Home with family assist  Equipment Recommendations: Patient owns necessary equipment  Recommend ongoing assistance for: In and out of house, Ambulation, Dressing, Bathing, Toileting    Therapist: Azucena Kuba, OTR/L  Date: 10/15/2016

## 2016-10-15 NOTE — Progress Notes
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  Mental / Cognitive Status: Alert;Oriented;Cooperative;Follows Commands  Pain: Patient complains of pain;Before activity;6/10;After activity;8/10  Pain Location: Abdomen;Post-surgical  Pain Interventions: Patient agrees to participate in therapy with modifications to session;Treatment altered to patient's pain tolerance;Patient assisted into position of comfort  R LE Precautions: RLE AFO: Ankle Foot Orthosis  L LE Precautions: LLE AFO: Ankle Foot Orthosis  Ambulation Assist: Independent Mobility in Community with Device  Patient Owned Equipment: Teacher, adult education (B AFOs)  Home Situation: Lives with Family  Type of Home: House  Entry Stairs: No Stairs  In-Home Stairs: Able to Live on One Level  Comments: Pt. plans to stay with parents at discharge; typically lives alone in an apartment    BED MOBILITY/TRANSFERS:  Bed Mobility/Transfers  Transfer Type: Sit to Stand  Transfer: Assistance Level: From;Bed Side Chair;Minimal Assist  Transfer: Assistive Device: Nurse, adult  Transfers: Type Of Assistance: For Balance;For Strength Deficit;For Safety Considerations    GAIT:  Gait  Gait Distance: 30 feet  Gait: Assistance Level: Minimal Assist  Gait: Assistive Device: Roller Walker  Gait: Descriptors: Pace: Futures trader;No balance loss;Decreased step length    EDUCATION:  Education  Persons Educated: Patient  Patient Barriers To Learning: None Noted  Teaching Methods: Verbal Instruction;Demonstration  Patient Response: Verbalized Understanding;Return Demonstration  Topics: Plan/Goals of PT Interventions;Use of Assistive Device/Orthosis;Mobility Progression;Up with Assist Only;Importance of Increasing Activity;Ambulate With Nursing;Positioning;Recommend Continued Therapy;Therapy Schedule    ASSESSMENT/PROGRESS:  Assessment/Progress  Impaired Mobility Due To: Pain;Decreased Activity Tolerance;Post Surgical Changes  Assessment/Progress: Should Improve w/ Continued PT  Comments: Fatigues quickly, on room air this date. Patient SpO2 95-97% throughout treatment.   AM-PAC 6 Clicks Basic Mobility Inpatient  Turning from your back to your side while in a flat bed without using bed rails: None  Moving from lying on your back to sitting on the side of a flatbed without using bedrails : A Little  Moving to and from a bed to a chair (including a wheelchair): A Little  Standing up from a chair using your arms (e.g. wheelchair, or bedside chair): A Little  To walk in hospital room: A Little  Climbing 3-5 steps with a railing: A Lot  Raw Score: 18  Standardized (T-scale) Score: 41.05  Basic Mobility CMS 0-100%: 40.47  CMS G Code Modifier for Basic Mobility: CK    GOALS:  Goals  Goal Formulation: With Patient  Time For Goal Achievement: 5 days  Pt Will Go Supine To/From Sit: w/ Stand By Assist  Pt Will Transfer Bed/Chair: w/ Stand By Assist  Pt Will Transfer Sit to Stand: Independently  Pt Will Ambulate: Greater than 200 Feet, w/ Walker, w/ Stand By Assist    PLAN:  Plan   Treatment Interventions: Mobility Training;Balance Activities;Endurance Training  Plan Frequency: 5-7 Days per Week  PT Plan for Next Visit: Increase gait distance, may need chair follow due to poor endurance.    RECOMMENDATIONS:  PT Discharge Recommendations  PT Discharge Recommendations: Home with Assistance;and;Home Health Setting  Equipment Recommendations: Patient owns necessary equipment  Recommend ongoing assistance for: In and out of house;Transfers;Ambulation    Therapist: Orvilla Fus  Patrecia Pour  Date: 10/15/2016

## 2016-10-15 NOTE — Telephone Encounter
States she received a referral from [place] - wanted to make sure Dr. Freida Busman will follow for nursing.  Returned call to [facility]  [place] - did let her know Dr. Freida Busman would be glad to follow.

## 2016-10-16 LAB — BASIC METABOLIC PANEL: Lab: 134 MMOL/L — ABNORMAL LOW (ref >60)

## 2016-10-16 LAB — MAGNESIUM: Lab: 2.3 mg/dL — ABNORMAL LOW (ref >60)

## 2016-10-16 LAB — CBC: Lab: 8.4 10*3/uL — ABNORMAL LOW (ref 4.5–11.0)

## 2016-10-16 NOTE — Progress Notes
RT Adult Assessment Note    NAME:Josealfredo Karron Rosenbauer             MRN: [id number]             DOB:05-05-1990          AGE: 26 y.o.  ADMISSION DATE: 10/13/2016             DAYS ADMITTED: LOS: 3 days    RT Treatment Plan:       Protocol Plan: Procedures  Oxygen/Humidity: O2 to keep SpO2 > 92%  Monitoring: Pulse oximetry continuous during night/sleep    Additional Comments:  Impressions of the patient: Pt awake/sitting in chair.  Intervention(s)/outcome(s): Unchanged. Pt on RA.  Patient education that was completed: N/A  Recommendations to the care team: N/A    Vital Signs:  Pulse: Pulse: 89  RR:    SpO2: SpO2: 94 %  O2 Device: $$ O2 Device: (S) Standby  Liter Flow:    O2%: O2 Percent: (S) 21 %  Breath Sounds:    Respiratory Effort: Respiratory Effort: Non-Labored

## 2016-10-16 NOTE — Progress Notes
Urology Progress Note 10/16/2016     ASSESSMENT:  Allen Gill is a 26 y.o. Male with spina bifida, neurogenic bladder s/p Procedure(s) (LRB):  CYSTECTOMY, ILEAL CONDUIT (N/A) on 10/13/2016.  LOS: 3 days     PLAN: Will discuss plan with staff - Dr. Marshell Garfinkel    - Pain control: continue PO pain meds PRN (has not received any IV)  - Diet/FEN: advance diet as tolerated, saline lock when taking adequate PO  - GI: continue bowel regimen, zofran prn.  Entereg stopped with ROBF  - GU: continue ileal conduit stoma education, maintain stents  - Heme/ID: continue Keflex suppression; continue to monitor asymptomatic acute blood loss anemia; leukocytosis resoved  - OOB/Amb, PT/OT  - Dispo: Continue inpatient care on post-cystectomy pathway; discharge planning    Prophylaxis Review:  -  DVT: Heparin, SCD's  -  GI: No  -  Catheter: No  -  Abx: Keflex for prophylaxis    Junius Roads, MD  Urology PGY-5  Please page urology on call with questions  ________________________________________________________________________   SUBJECTIVE:   No acute events overnight. Pain is well-controlled. He tolerated CLD without nausea or vomiting. He is ambulating in the room. +flatus, BM.     OBJECTIVE:                   Vital Signs: Most Recent                Vital Signs: Past 24 Hours   BP: 122/65 (10/06 0750)  Temp: 36.6 ???C (97.9 ???F) (10/06 0750)  Pulse: 88 (10/06 0750)  Respirations: 17 PER MINUTE (10/06 0750)  SpO2: 93 % (10/06 0750)  O2 Delivery: None (Room Air) (10/06 0750)  BP: (98-122)/(56-71)   Temp:  [36.4 ???C (97.6 ???F)-36.9 ???C (98.5 ???F)]   Pulse:  [85-102]   Respirations:  [16 PER MINUTE-17 PER MINUTE]   SpO2:  [93 %-100 %]   O2 Delivery: None (Room Air)     Physical Exam:  General: Alert & oriented, no acute distress  Pulm: Equal chest rise, non-labored  CV: Regular rate and rhythm  Extremities: No edema, SCD's in place  Abd: Soft, appropriately tender to palpation, JP SS Incisions/Wounds: Appropriately tender to palpation. Midline incision stapled, C/D/I. Stoma pink and patent with two visible externalized stents draining clear cherry urine into bag    Intake/Output:    Date 10/15/16 0701 - 10/16/16 0700 10/16/16 0701 - 10/17/16 0700   Shift 0701-1900 1901-0700 24 Hour Total 0701-1900 1901-0700 24 Hour Total   I  N  T  A  K  E   P.O. 0 240 240 0  0    I.V.  (mL/kg/hr)  2311.7  (2) 2311.7  (1)       Shift Total  (mL/kg) 0  (0) 2551.7  (26.2) 2551.7  (26.2) 0  (0)  0  (0)   O  U  T  P  U  T   Urine  (mL/kg/hr) 250  (0.2) 375  (0.3) 625  (0.3) 0  0      Urine  0 0         Urine Output (ml) (Ileal Conduit 10/13/16 1208 Right;Upper) 250 375 625 0  0    Drains 100 50 150         Drain Output (ml) Al Pimple Drain 10/13/16 1145 Left Abdomen) 100 50 150       Shift Total  (mL/kg) 350  (3.6) 425  (4.4) 775  (  7.9) 0  (0)  0  (0)   NET -350 2126.7 1776.7 0  0   Weight (kg) 97.5 97.5 97.5 97.5 97.5 97.5     Stool Occurrence: 1    Labs:                     Hematology                               Chemistry   Recent Labs      10/16/16   0435   WBC  8.4   HGB  9.7*   PLTCT  235    Recent Labs      10/15/16   0521  10/16/16   0435   NA  135*  134*   K  4.8  4.5   CL  102  104   CO2  26  27   BUN  13  14   CR  1.00  0.82   GFR  >60  >60   GLU  120*  123*   CA  8.5  8.2*   PO4  2.9   --         ACTIVE PROBLEMS:  Principal Problem:    Neurogenic bladder

## 2016-10-17 ENCOUNTER — Inpatient Hospital Stay: Admit: 2016-10-13 | Discharge: 2016-10-17 | Disposition: A | Discharge status: Home Health | Payer: MEDICARE

## 2016-10-17 ENCOUNTER — Inpatient Hospital Stay: Admit: 2016-10-13 | Discharge: 2016-10-13 | Payer: MEDICARE

## 2016-10-17 DIAGNOSIS — N319 Neuromuscular dysfunction of bladder, unspecified: Secondary | ICD-10-CM

## 2016-10-17 DIAGNOSIS — G4733 Obstructive sleep apnea (adult) (pediatric): ICD-10-CM

## 2016-10-17 DIAGNOSIS — Q059 Spina bifida, unspecified: ICD-10-CM

## 2016-10-17 DIAGNOSIS — Z982 Presence of cerebrospinal fluid drainage device: ICD-10-CM

## 2016-10-17 DIAGNOSIS — N200 Calculus of kidney: ICD-10-CM

## 2016-10-17 DIAGNOSIS — Z87442 Personal history of urinary calculi: ICD-10-CM

## 2016-10-17 LAB — CBC: Lab: 6 K/UL — ABNORMAL LOW (ref >60)

## 2016-10-17 LAB — BASIC METABOLIC PANEL: Lab: 137 MMOL/L — ABNORMAL LOW (ref 137–147)

## 2016-10-17 LAB — MAGNESIUM: Lab: 1.8 mg/dL — ABNORMAL LOW (ref >60)

## 2016-10-17 MED ORDER — SPIRONOLACTONE 50 MG PO TAB
25 mg | Freq: Every day | ORAL | 0 refills | Status: DC
Start: 2016-10-17 — End: 2016-10-17
  Administered 2016-10-17: 13:00:00 25 mg via ORAL

## 2016-10-17 MED ORDER — CEPHALEXIN 500 MG PO CAP
500 mg | ORAL_CAPSULE | Freq: Two times a day (BID) | ORAL | 0 refills | Status: AC
Start: 2016-10-17 — End: ?

## 2016-10-17 MED ORDER — SENNOSIDES-DOCUSATE SODIUM 8.6-50 MG PO TAB
1 | ORAL_TABLET | Freq: Two times a day (BID) | ORAL | 1 refills | Status: AC
Start: 2016-10-17 — End: 2016-10-25

## 2016-10-17 MED ORDER — OXYCODONE 5 MG PO TAB
5-15 mg | ORAL_TABLET | ORAL | 0 refills | 6.00000 days | Status: AC | PRN
Start: 2016-10-17 — End: 2016-10-25

## 2016-10-17 MED ORDER — MISCELLANEOUS MEDICAL SUPPLY MISC MISC
99 refills | 1.00000 days | Status: AC
Start: 2016-10-17 — End: 2017-11-10

## 2016-10-17 MED ORDER — ACETAMINOPHEN 325 MG PO TAB
650 mg | Freq: Four times a day (QID) | ORAL | 0 refills | Status: SS
Start: 2016-10-17 — End: 2017-04-19

## 2016-10-17 MED ORDER — CIPROFLOXACIN HCL 500 MG PO TAB
500 mg | ORAL_TABLET | Freq: Two times a day (BID) | ORAL | 0 refills | 10.00000 days | Status: AC
Start: 2016-10-17 — End: ?

## 2016-10-17 MED ORDER — POLYETHYLENE GLYCOL 3350 17 GRAM/DOSE PO POWD
17 g | Freq: Every day | ORAL | 3 refills | 30.00000 days | Status: AC
Start: 2016-10-17 — End: 2016-10-25

## 2016-10-17 NOTE — Case Management (ED)
Case Management Progress Note    NAME:Byrd Robbe Mccaster                          MRN: [id number]              DOB:[birth year]          AGE: 26 y.o.  ADMISSION DATE: 10/13/2016             DAYS ADMITTED: LOS: 4 days      Todays Date: 10/17/2016    Plan  Weekend RNCM called [facility] and advised them of patient d/c and faxed AVS to them.    Interventions  ? Support   Support: Pt/Family Updates re:POC or DC Plan, Patient Education  ? Info or Referral   Information or Referral to Community Resources: No Needs Identified  ? Discharge Planning   Discharge Planning: Home Health  ? Medication Needs   Medication Needs: No Needs Identified  ? Financial   Financial: No Needs Identified  ? Legal   Legal: No Needs Identified  ? Other        Disposition  ? Expected Discharge Date    Expected Discharge Date: 10/17/16  ? Transportation   Does the patient need discharge transport arranged?: No  Transportation Name, Phone and Availability #1: parents Dewayne Hatch or Hartley Barefoot [phone], [phone]  Does the patient use Medicaid Transportation?: No  ? Next Level of Care (Acute Psych discharges only)      ? Discharge Disposition                                          Durable Medical Equipment     No service has been selected for the patient.      [place] Destination     No service has been selected for the patient.      [facility] - Selection Complete     Service Request Status Selected Specialties Address Phone Number Fax Number    [facility] Selected Home Health Services [address], [place] [address] [phone]      [facility]     No service has been selected for the patient.      Tristan Schroeder, CCM  Integrated Case Manager  *[phone]

## 2016-10-17 NOTE — Progress Notes
Discharge material reviewed, questions and concerns addressed. Pt verbalizes understanding of information provided. Pt does not report pain, shows no signs of respiratory distress and is regularly passing stools/urine output > 16ml/hr. Pt leaving unit by wheelchair with belongings accompanied by staff member.

## 2016-10-17 NOTE — Progress Notes
PHYSICAL THERAPY  Progress Note  MOBILITY:  Mobility  Progressive Mobility Level: Walk laps  Distance Walked (feet): 360 ft  Level of Assistance: Stand by assistance  Assistive Device: Walker  Activity Limited By: No limitations    SUBJECTIVE:  Subjective  Significant hospital events: Dx: neurogenic bladder, s/p cystectomy with ileal conduit on 10/3  Mental / Cognitive Status: Alert;Oriented  Pain: Patient has no complaint of pain  R LE Precautions: RLE AFO: Ankle Foot Orthosis  L LE Precautions: LLE AFO: Ankle Foot Orthosis  Ambulation Assist: Independent Mobility in Community with Device  Patient Owned Equipment: Teacher, adult education (B AFOs)  Home Situation: Lives with Family  Type of Home: House  Entry Stairs: No Stairs  In-Home Stairs: Able to Live on One Level  Comments: Pt plans to stay with parents at discharge; typically lives alone in an apartment    BED MOBILITY/TRANSFERS:  Bed Mobility/Transfers  Comments: Patient reports he was independent getting out of bed. Offerred assessment, patient declined stating, I don't think I'll have any trouble at home.  Transfer Type: Sit to Stand  Transfer: Assistance Level: To/From;Bed Side Chair;Independent  Transfer: Assistive Device: Nurse, adult      GAIT:  Gait  Gait Distance: 360 feet (1 seated rest break)  Gait: Assistance Level: Standby Assist  Gait: Assistive Device: Nurse, adult  Gait: Descriptors: Pace: Futures trader;No balance loss;Decreased step length      EDUCATION:  Education  Persons Educated: Patient  Patient Barriers To Learning: None Noted  Teaching Methods: Verbal Instruction;Demonstration  Patient Response: Verbalized Understanding;Return Demonstration  Topics: Plan/Goals of PT Interventions;Use of Assistive Device/Orthosis;Mobility Progression;Up with Assist Only;Importance of Increasing Activity;Ambulate With Nursing;Positioning;Recommend Continued Therapy;Therapy Schedule    ASSESSMENT/PROGRESS:  Assessment/Progress Impaired Mobility Due To: Pain;Decreased Activity Tolerance;Post Surgical Changes  Comments: Patient continues to progress. Patient eager for discharge and reported no further questions or concerns for about mobility for discharge. Anticipate patient will continue to progress toward discharge without further acute physical therapy.   AM-PAC 6 Clicks Basic Mobility Inpatient  Turning from your back to your side while in a flat bed without using bed rails: None  Moving from lying on your back to sitting on the side of a flatbed without using bedrails : None  Moving to and from a bed to a chair (including a wheelchair): None  Standing up from a chair using your arms (e.g. wheelchair, or bedside chair): None  To walk in hospital room: A Little  Climbing 3-5 steps with a railing: A Little  Raw Score: 22  Standardized (T-scale) Score: 47.4  Basic Mobility CMS 0-100%: 25.02  CMS G Code Modifier for Basic Mobility: CJ    GOALS:  Goals  Goal Formulation: With Patient  Time For Goal Achievement: 5 days  Pt Will Go Supine To/From Sit: w/ Stand By Assist, Met  Pt Will Transfer Bed/Chair: w/ Stand By Assist, Met  Pt Will Transfer Sit to Stand: Independently, Met  Pt Will Ambulate: Greater than 200 Feet, w/ Walker, w/ Stand By Assist, Met    PLAN:  Plan   Plan Frequency: No Further Treatment    RECOMMENDATIONS:  PT Discharge Recommendations  PT Discharge Recommendations: Home with Assistance;and;Home Health Setting  Equipment Recommendations: Patient owns necessary equipment    Therapist: Collins Scotland, PT, DPT  Date: 10/17/2016

## 2016-10-17 NOTE — Progress Notes
Urology Progress Note 10/17/2016     ASSESSMENT:  Allen Gill is a 26 y.o. Male with spina bifida, neurogenic bladder s/p Procedure(s) (LRB):  CYSTECTOMY, ILEAL CONDUIT (N/A) on 10/13/2016.  LOS: 4 days     PLAN: Will discuss plan with staff - Dr. Marshell Garfinkel  - Pain control: continue PO pain meds PRN  - Diet/FEN: regular diet, saline lock IVF  - GI: continue bowel regimen, zofran prn.  Entereg stopped with ROBF  - GU: continue ileal conduit stoma education, maintain stents  - Heme/ID: continue Keflex suppression; continue to monitor asymptomatic acute blood loss anemia; leukocytosis resolved  - OOB/Amb, PT/OT  - Dispo: discharge to home today    Prophylaxis Review:  -  DVT: Heparin, SCD's  -  GI: No  -  Catheter: No  -  Abx: Keflex for prophylaxis    Margot Chimes, MD  PGY-3 Urology Resident  Please page Urology on call with any questions  ________________________________________________________________________   SUBJECTIVE:   No acute events overnight. Pain is well-controlled. He tolerated regular diet without nausea or vomiting. He is ambulating in the room. +flatus, BM.     OBJECTIVE:                   Vital Signs: Most Recent                Vital Signs: Past 24 Hours   BP: 115/64 (10/07 0750)  Temp: 36.9 ???C (98.5 ???F) (10/07 0750)  Pulse: 79 (10/07 0750)  Respirations: 17 PER MINUTE (10/07 0750)  SpO2: 97 % (10/07 0750)  O2 Delivery: None (Room Air) (10/07 0750)  BP: (100-127)/(64-74)   Temp:  [36.5 ???C (97.7 ???F)-37 ???C (98.6 ???F)]   Pulse:  [75-97]   Respirations:  [15 PER MINUTE-18 PER MINUTE]   SpO2:  [92 %-100 %]   O2 Delivery: None (Room Air)     Physical Exam:  General: Alert & oriented, no acute distress  Pulm: Equal chest rise, non-labored  CV: Regular rate and rhythm  Extremities: No edema, SCD's in place  Abd: Soft, appropriately tender to palpation, JP SS  Incisions/Wounds: Appropriately tender to palpation. Midline incision stapled, C/D/I. Stoma pink and patent with two visible externalized stents draining clear cherry urine into bag    Intake/Output:    Date 10/16/16 0701 - 10/17/16 0700 10/17/16 0701 - 10/18/16 0700   Shift 0701-1900 1901-0700 24 Hour Total 0701-1900 1901-0700 24 Hour Total   I  N  T  A  K  E   P.O. 0 0 0 0  0    Other  0 0       Shift Total  (mL/kg) 0  (0) 0  (0) 0  (0) 0  (0)  0  (0)   O  U  T  P  U  T   Urine  (mL/kg/hr) 225  (0.2) 425  (0.4) 650  (0.3) 125  125      Urine Output (ml) (Ileal Conduit 10/13/16 1208 Right;Upper) 225 425 650 125  125    Drains 180 140 320 60  60      Drain Output (ml) Al Pimple Drain 10/13/16 1145 Left Abdomen) 180 140 320 60  60    Shift Total  (mL/kg) 405  (4.2) 565  (5.8) 970  (9.9) 185  (1.9)  185  (1.9)   NET -405 -565 -970 -185  -185   Weight (kg) 97.5 97.5 97.5 97.5 97.5 97.5  Stool Occurrence: 1    Labs:                     Hematology                               Chemistry   Recent Labs      10/17/16   0416   WBC  6.0   HGB  8.8*   PLTCT  261    Recent Labs      10/15/16   0521   10/17/16   0416   NA  135*   < >  137   K  4.8   < >  3.9   CL  102   < >  105   CO2  26   < >  26   BUN  13   < >  11   CR  1.00   < >  0.66   GFR  >60   < >  >60   GLU  120*   < >  102*   CA  8.5   < >  8.2*   PO4  2.9   --    --     < > = values in this interval not displayed.        ACTIVE PROBLEMS:  Principal Problem:    Neurogenic bladder

## 2016-10-18 ENCOUNTER — Encounter: Admit: 2016-10-18 | Discharge: 2016-10-18 | Payer: MEDICARE

## 2016-10-18 NOTE — Telephone Encounter
Hospital Discharge Follow Up      Reached Patient:Yes     Admission Information:     Hospital Name: [facility]  Admission Date: 10/13/16  Discharge Date: 10/17/16  Discharge Diagnosis: Cystectomy with Ileal Conduit  Was this a readmission? No   Hospital Services: Planned  Today's call is 1(business) days post discharge      Discharge Instruction Review   Did patient receive and understand discharge instructions? Yes    Home Health ordered? Yes                 Agency name/telephone number: [facility]    Has [facility] agency contacted patient? Yes   Caregiver assistance in the home? Yes   Are there concerns regarding the patient's ADL'S? No  Is patient a fall risk? No    Special diet? No      Medication Reconciliation    Changes to pre-hospital medications? Yes  Were new prescriptions filled?Yes    Understanding Condition   Having any current symptoms? No  Patient understands when to seek additional medical care? Yes   Other instructions provided : "Cherry" colored urine should continue to clear up.      Scheduling Follow-up Appointment   Upcoming appointment date and time and with whom scheduled: Future Appointments  Date Time Provider [place]   10/25/2016 10:30 AM Sherri Sear, MD [facility]   10/26/2016 9:00 AM Thomes Dinning., MD [facility]   12/13/2016 9:00 AM [facility]   12/21/2016 9:15 AM [facility]   12/21/2016 10:15 AM West Carbo, MD [facility]   01/21/2017 1:30 PM Thomes Dinning., MD [facility]     PCP appointment scheduled?Yes, Date: 10/26/16   PCP primary location: [facility]  Specialist appointment scheduled? Yes, with Urology 10/25/16  Both PCP and Specialist appointment scheduled: Yes  Is assistance with transportation needed?No

## 2016-10-18 NOTE — Telephone Encounter
States patient was discharged from [facility] yesterday - will do his start of care today - he had a urostomy placed - and s/p an appendectomy - they only ordered nursing - would like a have PT and OT see him due to deconditioning and progressive weakness if ok with Dr. Freida Busman.  Returned call and spoke with Annabelle Harman - states she is there in his home - did let her know that Dr. Freida Busman will be fine with this.

## 2016-10-18 NOTE — Progress Notes
CARE MANAGEMENT FOLLOWUP      Discussed with patient:        PATIENT GOALS:  1. Continue to recover from Ileal Conduit initiation-Pt will have HH, PT/OT for next month while learning to manage ileal conduit.      BARRIERS:   Risk for activity intolerance.-Pt will be getting PT/OT per order by Dr. Freida Busman.       CARE MANAGEMENT PLAN:  Since the Last Patient Outreach Contact, the Patient has the following as indicated below:     ER visits: NA  Hospitalizations: 10/13/16  Urgent care: NA  Specialty appointment: 10/26/16  Primary Care appointment:  10/25/16  Change in exercise lifestyle: Yes- Risk for deconditioning and activity intolerance.  Change in nutrition: NA  Change in social support: Yes-Currently staying with parents for assistance.    Education:  OSTOMY CARE:  1.?????? Change pouch with any sign of leaking. Do not reinforce leaking pouch with tape  2.?????? Clean skin with water only - no soap or wipes  3.?????? If skin is broken down surrounding stoma, sprinkle stoma powder and wipe away the excess  4.??? Spray no sting skin prep on powdered skin and let dry completely  5.??? Cut new pouch leaving 1/8-1/4 of skin showing between the stoma and pouch  6.??? Warm new pouch in palm of hands  7.??? Ensure skin is dry and apply new pouch  8.??? Lay warm hand over pouch once it's applied  ???  - Change pouch immediately when leak is identified. Do not reinforce leaking pouch with tape as this will lead to breakdown of skin around stoma and can interfere with future pouching.  - Clean stoma and skin around stoma with water and washcloth. Do not use soap, wet wipes, bath wipes, alcohol or ointments as this will interfere with pouch seal to skin.  - Cut pouch to fit 1/8-1/4 inch larger than stoma. A thin sliver of skin should be visible between pouch edge and base of stoma.  - Ensure skin around stoma is dry then apply pouch to abdomen.  - Empty pouch when no more than 1/3 full. ??? - Please use wet wipe to clean out drainable tail of pouch prior to rolling up and securing closed to prevent residue and odor.   ???  Bettey Mare RN, BSN  Wound/ Ostomy Nursing Consult Service  Office: [phone]  Pager: [phone]  Wound/ Ostomy Team pager (after hours/ weekends): [phone]    What Is a Urostomy  Maintaining your Health: Urostomy        FUTURE APPOINTMENTS:    Upcoming appointment date and time and with whom scheduled: Future Appointments  Date Time Provider Department Center   10/25/2016 10:30 AM Glennie Isle, MD [facility]   10/26/2016 9:00 AM Annice Pih., MD [facility]   12/13/2016 9:00 AM [facility]   12/21/2016 9:15 AM [facility]   12/21/2016 10:15 AM Vonna Drafts, MD [facility]   01/21/2017 1:30 PM Annice Pih., MD [facility]        Next Patient Outreach Approximately in  11/18/16

## 2016-10-18 NOTE — Discharge Instructions - Pharmacy
Physician Discharge Summary      Name: Allen Gill  Medical Record Number: [id number]        Account Number:  [id number]  Date Of Birth:  [birth year]                         Age:  26 years   Admit date:  10/13/2016                     Discharge date:  10/17/2016    Attending Physician:  Glennie Isle, MD               Service: Surgery-Urology    Physician Summary completed by: Margot Chimes, MD    Reason for hospitalization: simple cystectomy with creation of ileal conduit    Significant PMH:   Past Medical History:   Diagnosis Date   ??? ADD (attention deficit disorder)     without hyperactivity   ??? Arnold-Chiari malformation (HCC)    ??? Headache(784.0)    ??? Hydrocephalus    ??? Infection of VP shunt    ??? Kidney stones     frequent   ??? Myelomeningocele (HCC)    ??? Neurogenic bladder    ??? OSA (obstructive sleep apnea)    ??? 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 Shunt malfunction    ??? Spina bifida (HCC)    ??? Urinary tract infection     frequent        Allergies: Latex; Amoxicillin; Ceclor [cefaclor]; Clindamycin; and Zosyn [piperacillin-tazobactam]    Brief Hospital Course:     The patient was admitted with the following History of Present Illness: patient with neurogenic bladder and now presenting for simple cystectomy with creation of ileal conduit   These issues were addressed as follows: The patient was taken to the OR on . The patient underwent above described procedure. The post-operative course was uncomplicated. The patient was deemed ready for discharge on 10/17/2016, and was sent home with the instructions reproduced below.  On the day of discharge, the patient was ambulating without difficulty, pain was well controlled on oral pain medications, was tolerating diet, was voiding and had bowel function.    Condition at Discharge: Stable    Discharge Diagnoses:     Hospital Problems        Active Problems    * (Principal)Neurogenic bladder Surgical Procedures: simple cystectomy with creation of ileal conduit    Significant Diagnostic Studies and Procedures: noted in brief hospital course    Consults:  None    Patient Disposition: Home       Patient instructions/medications:     CBC   Standing Status: Future  Standing Exp. Date: 10/15/17     Basic Metabolic Panel (BMP)   Standing Status: Future  Standing Exp. Date: 10/15/17     Driving Restrictions   No driving while taking pain medication.     Strenuous Activity Restrictions   Please refrain from strenuous activity for 6 week(s). No lifting more than 10 pounds for 6 weeks.     Report These Signs and Symptoms   Please notify physician if experiencing any chest pain, shortness of breath, calf tenderness or unilateral leg swelling, uncontrolled pain, incision redness or foul smelling drainage from wound, fevers >101.5, or any other worsening signs/symptoms.     Questions About Your Stay   For questions or concerns regarding your hospital stay. Call [phone]  You may have questions about your hospital stay after you get home.      From 8 AM to 4 PM Monday through Friday, please call [phone].   If calling after hours or with urgent questions, please call [phone] and ask for the urology resident on call.  In case of an emergency, please report to your nearest emergency department and contact us on the way.   Discharging attending physician: Glennie Isle [[id number]]      Regular Diet   You have no dietary restriction. Please continue with a healthy balanced diet.     Incision Care   *Keep your incision clean and dry.  *Shower daily. Wash your incisions with soap and water.   *Do not submerge incision in tub, pool, hot tub, or lake for 4 weeks.  *Usually there are not stitches to be removed. Steri-strips (strips of tape) will begin to fall off in 10-14 days. If they remain after 2 weeks, gently remove them when they are damp after a shower. *Your incision should gradually look better each day. If you notice unusual swelling, redness, drainage, have increasing pain at the site, or have a fever greater than 100 degrees, notify your physician immediately.     Suture/Staple Removal   You will need your staples removed at follow up appointment.     Ostomy Care   Ileal Conduit Care Home care instructions:  Change ostomy bag every 7 days or if it starts leaking.  When changing ostomy bag cleanse skin with water only.    Discharge Supply List:  *1 4x5 Tegaderm  *1 2x2 gauze bandage  *1 Large overnight bag  *1 2-piece ostomy appliance     Return Appointment   You will need to go to the [facility] at 9:00 to have your labs drawn on the ground floor of the Medical Office Building. You will see Dr. Oren Bracket at 10:30 in the Urology clinic on the second floor of the Medical Office Building. Begin taking the Cipro (antibiotics) the day prior   [place] Provider Glennie Isle [[id number]]    Location [facility]    Appointment date: 10/25/2016    Appointment time: 10:30 AM      Opioid (Narcotic) Safety Information   OPIOID (NARCOTIC) PAIN MEDICATION SAFETY    We care about your comfort, and believe you need opioid medications at this time to treat your pain.  An opioid is a strong pain medication.  It is only available by prescription for moderate to severe pain.  Usually these medications are used for only a short time to treat pain, but sometimes will be prescribed for longer.  Talk with your doctor or nurse about how long they expect you to need this medication.    When used the right way, opioids are safe and effective medications to treat your pain, even when used for a long time.  Yet, when used in the wrong way, opioids can be dangerous for you or others.  Opioids do not work for everyone.  Most patients do not get full relief of their pain from opioid medication; full relief of your pain may not be possible. For your safety, we ask you to follow these instructions:    *Only take your opioid medication as prescribed.  If your pain is not controlled with the prescribed dose, or the medication is not lasting long enough, call your doctor.  *Do not break or crush your opioid medication unless your doctor or pharmacist says  you can.  With certain medications, this can be dangerous, and may cause death.  *Never share your medications with others, even if they appear to have a good reason.  Never take someone else's pain medication-this is dangerous, and illegal (a crime).  Overdoses and deaths have occurred.  *Keep your opioid medications safe, as you would with cash, in a lock box or similar container.  *Make sure your opioids are going to be secure, especially if you are around children or teens.  *Talk with your doctor or pharmacist before you take other medications.  *Avoid driving, operating machinery, or drinking alcohol while taking opioid pain medication.  This may be unsafe.    Pain medications can cause constipation. Constipation is bowel movements that are less often than normal. Stools often become very hard and difficult to pass. This may lead to stomach pain and bloating. It may also cause pain when trying to use the bathroom. Constipation may be treated with suppositories, laxatives or stool softeners. A diet high in fiber with plenty of fluids helps to maintain regular, soft bowel movements.     Additional Discharge Instructions   Your drain has been removed and a dressing has been placed over it. You can expect to have drainage over the next few days but this will decrease with time. MAKE SURE THAT YOU TAKE THE DRESSING OFF THE DAY AFTER YOU DISCHARGE. Leaving the dressing on for too long can cause an infection. You may place another dressing over it to catch the drainage but change this daily. Once it is no longer draining, leave it open to air.        Current Discharge Medication List START taking these medications    Details   acetaminophen (TYLENOL) 325 mg tablet Take two tablets by mouth four times daily.  Refills: 0    PRESCRIPTION TYPE:  OTC      cephalexin (KEFLEX) 500 mg capsule Take one capsule by mouth twice daily for 8 days.  Qty: 16 capsule, Refills: 0    PRESCRIPTION TYPE:  Print      ciprofloxacin (CIPRO) 500 mg tablet Take one tablet by mouth twice daily for 3 days. Begin taking the day prior to stent removal.  Qty: 6 tablet, Refills: 0    PRESCRIPTION TYPE:  Print      Miscellaneous Medical Supply misc Urostomy supplies and accessories  Dispense one month of supplies  Neurogenic bladder [N31.9]  Qty: 10 Device, Refills: 99    PRESCRIPTION TYPE:  Print      oxyCODONE (ROXICODONE, OXY-IR) 5 mg tablet Take one tablet to three tablets by mouth every 4 hours as needed (severe pain po) Earliest Fill Date: 10/17/16  Qty: 40 tablet, Refills: 0    PRESCRIPTION TYPE:  Print      polyethylene glycol 3350 (GLYCOLAX; MIRALAX) 17 gram/dose powder Take seventeen g by mouth daily.  Qty: 527 g, Refills: 3    PRESCRIPTION TYPE:  Print      senna/docusate (SENOKOT-S) 8.6/50 mg tablet Take one tablet by mouth twice daily.  Qty: 60 tablet, Refills: 1    PRESCRIPTION TYPE:  Print          CONTINUE these medications which have NOT CHANGED    Details   carvedilol (COREG) 12.5 mg tablet Take one tablet by mouth twice daily with meals. Take with food.  Qty: 180 tablet, Refills: 3    PRESCRIPTION TYPE:  Normal      imipramine (TOFRANIL) 25 mg tablet TAKE 2  TABLETS BY MOUTH IN THE MORNING AND 1 TABLET IN THE EVENING  Qty: 270 tablet, Refills: 0    PRESCRIPTION TYPE:  Normal      levETIRAcetam (KEPPRA) 500 mg tablet Take 1 tablet by mouth twice daily.  Qty: 60 tablet, Refills: 11    PRESCRIPTION TYPE:  Normal  Comments: Please call to be seen      lisinopril (PRINIVIL; ZESTRIL) 5 mg tablet Take one tablet by mouth daily.  Qty: 90 tablet, Refills: 3    PRESCRIPTION TYPE:  Normal MV,Ca,Min-Iron-FA-Lycopene (CENTRUM MEN) 8 mg iron- 200 mcg-600 mcg tab Take 1 Tab by mouth daily.    PRESCRIPTION TYPE:  Historical Med      polycarbophil (FIBERCON) 625 mg tablet Take 625 mg by mouth twice daily.    PRESCRIPTION TYPE:  Historical Med      spironolactone (ALDACTONE) 25 mg tablet Take 1 tablet by mouth daily. Take with food.  Qty: 90 tablet, Refills: 3    PRESCRIPTION TYPE:  Normal          The following medications were removed from your list. This list includes medications discontinued this stay and those removed from your prior med list in our system        oxybutynin chloride (DITROPAN) 5 mg tablet               Scheduled appointments:    Oct 21, 2016 10:30 AM CDT  Medicare Annual Wellness Visit with Guy Sandifer. Freida Busman, MD  [facility] ([address]) [facility]  [address]  [place] [address]  [phone]   Oct 25, 2016 10:30 AM CDT  Post - Op with Glennie Isle, MD  [facility]) [facility] Level 2a  [address]  [place] [address]  [phone]   Dec 13, 2016  9:00 AM CST  Echo Doppler with [facility] ECHO  [facility] [place] [address]  [address]  [place] [address]  [phone]   Dec 21, 2016  9:15 AM CST  US RENAL BLADDER LTD with SONOGRAPHY-MOB  [facility]) [address] Med Office [place]  2nd Floor  [place] [address]  [phone]   Dec 21, 2016 10:15 AM CST  Return Patient with Vonna Drafts, MD  [facility] U[facility]) Ortho And [facility] Level 2a  [address]  [place] [address]  [phone]   Jan 21, 2017  1:30 PM CST  Return Patient with Guy Sandifer. Freida Busman, MD  [facility])  [facility]  [address]  [place] [address]  [phone]          Pending items needing follow up: none    Signed:  Margot Chimes, MD  10/18/2016      cc: Primary Care Physician:  Annice Pih   Verified  Referring physicians:  Annice Pih., MD   Additional provider(s):

## 2016-10-18 NOTE — Telephone Encounter
TOC call made by CCM RN, Rush Farmer. No call will be made by this [facility] nurse.

## 2016-10-25 ENCOUNTER — Encounter: Admit: 2016-10-25 | Discharge: 2016-10-25 | Payer: MEDICARE

## 2016-10-25 ENCOUNTER — Ambulatory Visit: Admit: 2016-10-25 | Discharge: 2016-10-25 | Payer: MEDICARE

## 2016-10-25 DIAGNOSIS — F988 Other specified behavioral and emotional disorders with onset usually occurring in childhood and adolescence: ICD-10-CM

## 2016-10-25 DIAGNOSIS — R569 Unspecified convulsions: ICD-10-CM

## 2016-10-25 DIAGNOSIS — N200 Calculus of kidney: ICD-10-CM

## 2016-10-25 DIAGNOSIS — Q059 Spina bifida, unspecified: Secondary | ICD-10-CM

## 2016-10-25 DIAGNOSIS — T85618A Breakdown (mechanical) of other specified internal prosthetic devices, implants and grafts, initial encounter: ICD-10-CM

## 2016-10-25 DIAGNOSIS — N390 Urinary tract infection, site not specified: ICD-10-CM

## 2016-10-25 DIAGNOSIS — I420 Dilated cardiomyopathy: Principal | ICD-10-CM

## 2016-10-25 DIAGNOSIS — N319 Neuromuscular dysfunction of bladder, unspecified: ICD-10-CM

## 2016-10-25 DIAGNOSIS — I10 Essential (primary) hypertension: ICD-10-CM

## 2016-10-25 DIAGNOSIS — G919 Hydrocephalus, unspecified: ICD-10-CM

## 2016-10-25 DIAGNOSIS — G4733 Obstructive sleep apnea (adult) (pediatric): ICD-10-CM

## 2016-10-25 DIAGNOSIS — R51 Headache: ICD-10-CM

## 2016-10-25 DIAGNOSIS — Q0700 Arnold-Chiari syndrome without spina bifida or hydrocephalus: ICD-10-CM

## 2016-10-25 DIAGNOSIS — R339 Retention of urine, unspecified: Principal | ICD-10-CM

## 2016-10-25 LAB — CBC
Lab: 11 g/dL — ABNORMAL LOW (ref 13.5–16.5)
Lab: 18 % — ABNORMAL HIGH (ref 11–15)
Lab: 25 pg — ABNORMAL LOW (ref 26–34)
Lab: 31 g/dL — ABNORMAL LOW (ref 32.0–36.0)
Lab: 37 % — ABNORMAL LOW (ref 40–50)
Lab: 4.4 M/UL (ref 4.4–5.5)
Lab: 556 10*3/uL — ABNORMAL HIGH (ref 150–400)
Lab: 8.2 FL (ref 7–11)
Lab: 82 FL — ABNORMAL LOW (ref 80–100)
Lab: 9.5 K/UL (ref 4.5–11.0)

## 2016-10-25 LAB — COMPREHENSIVE METABOLIC PANEL
Lab: 134 MMOL/L — ABNORMAL LOW (ref 137–147)
Lab: 4.4 MMOL/L (ref 3.5–5.1)
Lab: 98 mg/dL (ref 70–100)
Lab: >60 mL/min (ref >60)
Lab: >60 mL/min (ref >60)
Lab: 7 (ref 3–12)

## 2016-10-25 LAB — BNP (B-TYPE NATRIURETIC PEPTI): Lab: 15 pg/mL — ABNORMAL LOW (ref 0–100)

## 2016-10-26 ENCOUNTER — Ambulatory Visit: Admit: 2016-10-26 | Discharge: 2016-10-27 | Payer: MEDICARE

## 2016-10-26 ENCOUNTER — Encounter: Admit: 2016-10-26 | Discharge: 2016-10-26 | Payer: MEDICARE

## 2016-10-26 DIAGNOSIS — G4733 Obstructive sleep apnea (adult) (pediatric): ICD-10-CM

## 2016-10-26 DIAGNOSIS — R51 Headache: ICD-10-CM

## 2016-10-26 DIAGNOSIS — T85618A Breakdown (mechanical) of other specified internal prosthetic devices, implants and grafts, initial encounter: ICD-10-CM

## 2016-10-26 DIAGNOSIS — G919 Hydrocephalus, unspecified: ICD-10-CM

## 2016-10-26 DIAGNOSIS — Z982 Presence of cerebrospinal fluid drainage device: ICD-10-CM

## 2016-10-26 DIAGNOSIS — Q052 Lumbar spina bifida with hydrocephalus: ICD-10-CM

## 2016-10-26 DIAGNOSIS — Q0700 Arnold-Chiari syndrome without spina bifida or hydrocephalus: ICD-10-CM

## 2016-10-26 DIAGNOSIS — Z936 Other artificial openings of urinary tract status: Principal | ICD-10-CM

## 2016-10-26 DIAGNOSIS — Q059 Spina bifida, unspecified: Principal | ICD-10-CM

## 2016-10-26 DIAGNOSIS — R569 Unspecified convulsions: ICD-10-CM

## 2016-10-26 DIAGNOSIS — N319 Neuromuscular dysfunction of bladder, unspecified: ICD-10-CM

## 2016-10-26 DIAGNOSIS — F988 Other specified behavioral and emotional disorders with onset usually occurring in childhood and adolescence: ICD-10-CM

## 2016-10-26 DIAGNOSIS — N200 Calculus of kidney: ICD-10-CM

## 2016-10-26 DIAGNOSIS — N390 Urinary tract infection, site not specified: ICD-10-CM

## 2016-10-29 NOTE — Progress Notes
Date of Service: 10/26/2016    Allen Gill is a 26 y.o. male.  DOB: 24-Aug-1990  MRN: [id number]     Subjective:             History of Present Illness  Patient returns for follow-up of recent hospitalization and surgery.  Patient was admitted and had a cystectomy with ileal conduit created to solve his long-standing urinary tract problems related to his spina bifida.  He is recovering very well he has no specific complaints today incisions are healing well and he reports no problems with his ileostomy and appliances.  Likewise he has had no recent seizures or headaches suggesting problems with his shunt       Review of Systems   Constitutional: Negative for chills, fatigue, fever and unexpected weight change.   Respiratory: Negative for cough.    Cardiovascular: Negative for chest pain.   Gastrointestinal: Negative for constipation, diarrhea, nausea and vomiting.   Musculoskeletal: Negative for joint swelling.   Neurological: Negative for weakness and headaches.         Objective:         ??? acetaminophen (TYLENOL) 325 mg tablet Take two tablets by mouth four times daily.   ??? carvedilol (COREG) 12.5 mg tablet Take one tablet by mouth twice daily with meals. Take with food.   ??? imipramine (TOFRANIL) 25 mg tablet TAKE 2 TABLETS BY MOUTH IN THE MORNING AND 1 TABLET IN THE EVENING   ??? levETIRAcetam (KEPPRA) 500 mg tablet Take 1 tablet by mouth twice daily.   ??? lisinopril (PRINIVIL; ZESTRIL) 5 mg tablet Take one tablet by mouth daily.   ??? Miscellaneous Medical Supply misc Urostomy supplies and accessories  Dispense one month of supplies  Neurogenic bladder [N31.9]   ??? MV,Ca,Min-Iron-FA-Lycopene (CENTRUM MEN) 8 mg iron- 200 mcg-600 mcg tab Take 1 Tab by mouth daily.   ??? spironolactone (ALDACTONE) 25 mg tablet Take 1 tablet by mouth daily. Take with food.     Vitals:    10/26/16 1016   BP: 108/58   Pulse: 108   SpO2: 94%   Weight: 95.1 kg (209 lb 9.6 oz)     Body mass index is 35.96 kg/m???.     Physical Exam Constitutional: He is oriented to person, place, and time. He appears well-developed and well-nourished.   Cardiovascular: Normal rate and regular rhythm.    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   Psychiatric: He has a normal mood and affect. His behavior is normal. Judgment and thought content normal.   Nursing note and vitals reviewed.           Assessment and Plan:  1. S/P ileal conduit (HCC)     2. Arnold-Chiari malformation (HCC)     3. S/P VP shunt     4. 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

## 2016-10-30 NOTE — Progress Notes
Date of Service: 10/25/2016    Subjective:             Allen Gill is a 26 y.o. male.    History of Present Illness  26 year old male with history of spina bifida status post cystectomy with ileal conduit for neurogenic bladder.  Patient returns reporting he is doing reasonably well.  His pain is well controlled is not taking pain medications.  His appetite is low but is improving.  His energy level also remains low but is improving.      Past Medical History:   Diagnosis Date   ??? ADD (attention deficit disorder)     without hyperactivity   ??? Arnold-Chiari malformation (HCC)    ??? Headache(784.0)    ??? Hydrocephalus    ??? Infection of VP shunt    ??? Kidney stones     frequent   ??? Myelomeningocele (HCC)    ??? Neurogenic bladder    ??? OSA (obstructive sleep apnea)    ??? 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 Shunt malfunction    ??? Spina bifida (HCC)    ??? Urinary tract infection     frequent     Past Surgical History:   Procedure Laterality Date   ??? HX ABDOMEN SURGERY  01/2001    Cecostomy   ??? SHUNT REVISION  October 2010    [place]   ??? SHUNT REVISION  12/11/09    replacment of valve to acodman hakem adjustablevalve   ??? CATHETER IMPLANT/REVISION  12/27/09    distal end of the catheter was revised   ??? CATHETER IMPLANT/REVISION  01/31/10    replacement of ventricular catheter with [employer] framelessstereotaxis catheter   ??? VENTRICULOSTOMY  02/03/10    removal of all shunt components and placementofright frontal ventriculostomy   ??? SHUNT REVISION  02/17/10    left frontal ventriculopleural shunt   ??? PR CRTJ SHUNT VENTRICULO-PERITNEAL-PLEURAL TERMINUS Right 06/14/2014    SHUNT CREATION VENTRICULAR PERITONEAL performed by Angelia Mould, MD at [facility]   ??? SHUNT REVISION Right 04/23/2015    SHUNT REVISION VENTRICULAR PERITONEAL performed by Angelia Mould, MD at [facility]   ??? HARDWARE REMOVAL Left 05/20/2015    REMOVAL HARDWARE HEAD: removal of left ventriculopleural shunt performed by Angelia Mould, MD at [facility]   ??? PR RMVL COMPL CSF SHUNT SYSTEM W/O RPLCMT SHUNT Right 05/23/2015    SHUNT REMOVAL VENTRICULAR PERITONEAL performed by Angelia Mould, MD at [facility]   ??? CSF SHUNT Left 06/03/2015    SHUNT CREATION VENTRICULAR PERITONEAL performed by Angelia Mould, MD at [facility]   ??? PR LITHOLAPAXY SMPL/SM <2.5 CM N/A 08/05/2015    CYSTOURETHROSCOPY, CYSTOLITHOLAPAXY performed by Pennie Banter, MD at [facility]   ??? PR LASER ENUCLEATION PROSTATE W/MORCELLATION N/A 08/19/2015    HOLMIUM LASER ENUCLEATION OF PROSTATE (NO MORCELLATION) performed by Vonna Drafts, MD at [facility]   ??? PR LITHOLAPAXY COMP/LG > 2.5 CM N/A 08/19/2015    CYSTOSCOPY performed by Vonna Drafts, MD at [facility]   ??? PR CYSTO W/IRRIG & EVAC MULTPLE OBSTRUCTING CLOTS N/A 08/29/2015    CYSTOSCOPY EVACUATION CLOTS performed by Vonna Drafts, MD at [facility]   ??? CYSTOSCOPY N/A 02/20/2016    CYSTOSCOPY, URETHERAL DILATION performed by Vonna Drafts, MD at [facility]   ??? CYSTECTOMY N/A 10/13/2016    CYSTECTOMY, ILEAL CONDUIT performed by Glennie Isle, MD at [facility]   ???  HX ABDOMEN SURGERY      fundoplication   ??? HX BACK SURGERY      repair of spina bifida   ??? HX BRAIN SURGERY  5 months old    Chiari decompression   ??? HX EAR TUBES     ??? HX HERNIA REPAIR      inguinal hernia   ??? HX SURGERY  at 2 weeks    VP Shunt   ??? HX TONSILLECTOMY     ??? HX TRACHEOSTOMY     ??? SHUNT REVISION  3 months old   ??? SHUNT REVISION  26 years old     Family History   Problem Relation Age of Onset   ??? Diabetes Father    ??? Hypertension Father    ??? Other Father         glaucoma   ??? High Cholesterol Maternal Grandfather      Social History     Social History   ??? Marital status: Single     Spouse name: N/A   ??? Number of children: N/A   ??? Years of education: N/A     Occupational History   ??? [employer] Student     Social History Main Topics   ??? Smoking status: Never Smoker   ??? Smokeless tobacco: Never Used   ??? Alcohol use No ??? Drug use: No   ??? Sexual activity: Not Currently     Partners: Female     Other Topics Concern   ??? Not on file     Social History Narrative   ??? No narrative on file            Review of Systems  Abdominal pain  fatigue    Objective:         ??? acetaminophen (TYLENOL) 325 mg tablet Take two tablets by mouth four times daily.   ??? carvedilol (COREG) 12.5 mg tablet Take one tablet by mouth twice daily with meals. Take with food.   ??? imipramine (TOFRANIL) 25 mg tablet TAKE 2 TABLETS BY MOUTH IN THE MORNING AND 1 TABLET IN THE EVENING   ??? levETIRAcetam (KEPPRA) 500 mg tablet Take 1 tablet by mouth twice daily.   ??? lisinopril (PRINIVIL; ZESTRIL) 5 mg tablet Take one tablet by mouth daily.   ??? Miscellaneous Medical Supply misc Urostomy supplies and accessories  Dispense one month of supplies  Neurogenic bladder [N31.9]   ??? MV,Ca,Min-Iron-FA-Lycopene (CENTRUM MEN) 8 mg iron- 200 mcg-600 mcg tab Take 1 Tab by mouth daily.   ??? spironolactone (ALDACTONE) 25 mg tablet Take 1 tablet by mouth daily. Take with food.     Vitals:    10/25/16 0930   BP: 121/66   Pulse: 95   Weight: 96 kg (211 lb 9.6 oz)   Height: 162.6 cm (64.02)     Body mass index is 36.3 kg/m???.     Physical Exam  Well-healed incision, all staples removed    Pink stoma in right lower quadrant.  Stents removed, inspected and intact.      Basic Metabolic Profile    Lab Results   Component Value Date/Time    NA 134 (L) 10/25/2016 08:43 AM    K 4.4 10/25/2016 08:43 AM    CA 9.9 10/25/2016 08:43 AM    CL 97 (L) 10/25/2016 08:43 AM    CO2 30 10/25/2016 08:43 AM    GAP 7 10/25/2016 08:43 AM    Lab Results   Component Value Date/Time  BUN 11 10/25/2016 08:43 AM    CR 0.71 10/25/2016 08:43 AM    GLU 98 10/25/2016 08:43 AM    GLU 73 08/30/2016 12:23 PM             Assessment and Plan:  26 year old male status post cystectomy with ileal conduit for neurogenic bladder secondary to spina bifida    Continue peri-stent pull antibiotic prophylaxis Return to clinic in 3 months with labs.

## 2016-11-11 ENCOUNTER — Encounter: Admit: 2016-11-11 | Discharge: 2016-11-11 | Payer: MEDICARE

## 2016-11-11 MED ORDER — IMIPRAMINE HCL 25 MG PO TAB
ORAL_TABLET | ORAL | 0 refills | Status: AC
Start: 2016-11-11 — End: 2017-01-07

## 2016-11-11 NOTE — Telephone Encounter
LOV:10/26/2016 seen for S/P ileal conduit     JTT:[id number] 25mg  270 tabs, 0 refills 08/18/2016

## 2016-11-19 ENCOUNTER — Encounter: Admit: 2016-11-19 | Discharge: 2016-11-19 | Payer: MEDICARE

## 2016-11-19 NOTE — Telephone Encounter
Misty from [facility] is calling because the patient was having some symptoms of UTI earlier this week and they did collect a specimen when they did his bag change. She would like to know if we have received the results and if there will be any treatment for the patient.     She would also like to know if the patient should be flushed? If he should she would like to know how much and how often? Please advise. She is re faxing the results for review.

## 2016-12-13 ENCOUNTER — Emergency Department: Admit: 2016-12-13 | Discharge: 2016-12-14 | Payer: MEDICARE

## 2016-12-13 LAB — URINALYSIS, MICROSCOPIC: Lab: POSITIVE — AB

## 2016-12-13 LAB — URINALYSIS DIPSTICK
Lab: NEGATIVE
Lab: NEGATIVE
Lab: POSITIVE — AB

## 2016-12-14 LAB — COMPREHENSIVE METABOLIC PANEL
Lab: 0.4 mg/dL (ref 0.3–1.2)
Lab: 0.7 mg/dL (ref 0.4–1.24)
Lab: 103 MMOL/L — ABNORMAL HIGH (ref 98–110)
Lab: 114 U/L — ABNORMAL HIGH (ref 25–110)
Lab: 135 MMOL/L — ABNORMAL LOW (ref 137–147)
Lab: 14 mg/dL (ref 7–25)
Lab: 23 MMOL/L (ref 21–30)
Lab: 27 U/L (ref 7–40)
Lab: 4.4 MMOL/L (ref 3.5–5.1)
Lab: 4.8 g/dL (ref 3.5–5.0)
Lab: 40 U/L (ref 7–56)
Lab: 8.3 g/dL — ABNORMAL HIGH (ref 6.0–8.0)
Lab: 86 mg/dL (ref 70–100)
Lab: 9 K/UL (ref 3–12)
Lab: 9.9 mg/dL (ref 8.5–10.6)
Lab: >60 mL/min (ref >60)
Lab: >60 mL/min (ref >60)

## 2016-12-14 LAB — CBC AND DIFF
Lab: 13 g/dL (ref 13.5–16.5)
Lab: 42 % (ref 40–50)
Lab: 5.1 M/UL (ref 4.4–5.5)
Lab: 82 FL (ref 80–100)
Lab: 9.3 10*3/uL (ref 4.5–11.0)

## 2016-12-14 NOTE — ED Notes
Pt d/c'd to home with instructions in hand. Pt verbalized understanding of instructions and all questions answered. Pt alert and oriented x4 in no sign of distress.

## 2016-12-14 NOTE — ED Notes
Pt replaced his own urostomy bag with assist from this RN. Pt educated and completed task well.

## 2016-12-15 ENCOUNTER — Encounter: Admit: 2016-12-15 | Discharge: 2016-12-15 | Payer: MEDICARE

## 2016-12-15 NOTE — Telephone Encounter
ED Discharge Follow Up  Reached patient: Yes  Admission Information  Hospital Name : [facility]  ED Admission Date: 12/13/16 ED Discharge Date: 12/13/16 Discharge Diagnosis: Hematuria, unspecified type (Primary)  Hospital Services: Unplanned  Today's call is 2 (calendar) days post discharge    Discharge Instruction Review  Did patient receive and understand discharge instructions? Yes  Are there concerns regarding the patients ADLs ? No    Medication Reconciliation  Changes to pre-ED visit medications? No  Were new prescriptions filled?n/a    Understanding Condition  Having any current symptoms? No; pt states he is doing fine and denies any blood in urine.  He denies any needs at this time.  He is scheduled to f/u with Urology on 12/20/16. He will contact office with any questions or concerns.  Patient understands when to seek additional medical care? Yes  Is patient aware of [facility] locations?Yes  Urgent Care appropriate for this diagnosis? No  Other instructions provided:     Scheduling Follow-up Appointment  Upcoming appointment date and time and with whom scheduled: Future Appointments  Date Time Provider Department Center   12/20/2016 10:15 AM Glennie Isle, MD [facility] Urology   12/21/2016 9:15 AM [facility] Radiolog   12/21/2016 10:15 AM Vonna Drafts, MD [facility] Urology   12/31/2016 9:00 AM [facility]   01/21/2017 1:30 PM Annice Pih., MD [facility]     When was patients last PCP visit: 10/26/2016  PCP primary location: [facility]  PCP appointment scheduled? No, routine appt 01/21/2017  Specialist appointment scheduled? Yes, with Urology 12/20/16  Both PCP and Specialist appointment scheduled: No  Is assistance with transportation needed? No

## 2016-12-15 NOTE — Telephone Encounter
Patient called and did not state what he would like to ask.     Called patient back and he wanted to know if they were going to cath him for the RUS that he was having done. I did explain to him what the procedure was and that they should not be cathing him for this Ultrasound. Patient verbalized understanding and thanked me for the call.

## 2016-12-16 ENCOUNTER — Emergency Department: Admit: 2016-12-16 | Discharge: 2016-12-16 | Payer: MEDICARE

## 2016-12-16 ENCOUNTER — Encounter: Admit: 2016-12-16 | Discharge: 2016-12-16 | Payer: MEDICARE

## 2016-12-16 ENCOUNTER — Inpatient Hospital Stay: Admit: 2016-12-16 | Discharge: 2016-12-24 | Disposition: A | Discharge status: Home Health | Payer: MEDICARE

## 2016-12-16 DIAGNOSIS — N319 Neuromuscular dysfunction of bladder, unspecified: ICD-10-CM

## 2016-12-16 DIAGNOSIS — T85618A Breakdown (mechanical) of other specified internal prosthetic devices, implants and grafts, initial encounter: ICD-10-CM

## 2016-12-16 DIAGNOSIS — G4733 Obstructive sleep apnea (adult) (pediatric): ICD-10-CM

## 2016-12-16 DIAGNOSIS — G919 Hydrocephalus, unspecified: ICD-10-CM

## 2016-12-16 DIAGNOSIS — F988 Other specified behavioral and emotional disorders with onset usually occurring in childhood and adolescence: ICD-10-CM

## 2016-12-16 DIAGNOSIS — Q059 Spina bifida, unspecified: ICD-10-CM

## 2016-12-16 DIAGNOSIS — H538 Other visual disturbances: ICD-10-CM

## 2016-12-16 DIAGNOSIS — R51 Headache: ICD-10-CM

## 2016-12-16 DIAGNOSIS — R569 Unspecified convulsions: ICD-10-CM

## 2016-12-16 DIAGNOSIS — T8501XA Breakdown (mechanical) of ventricular intracranial (communicating) shunt, initial encounter: Principal | ICD-10-CM

## 2016-12-16 DIAGNOSIS — N390 Urinary tract infection, site not specified: ICD-10-CM

## 2016-12-16 DIAGNOSIS — G40909 Epilepsy, unspecified, not intractable, without status epilepticus: ICD-10-CM

## 2016-12-16 DIAGNOSIS — N200 Calculus of kidney: ICD-10-CM

## 2016-12-16 DIAGNOSIS — Q0700 Arnold-Chiari syndrome without spina bifida or hydrocephalus: ICD-10-CM

## 2016-12-16 LAB — URINALYSIS DIPSTICK
Lab: NEGATIVE K/UL — ABNORMAL LOW (ref 3–12)
Lab: NEGATIVE MMOL/L (ref 21–30)
Lab: NEGATIVE U/L (ref 25–110)
Lab: NEGATIVE U/L (ref 7–56)
Lab: NEGATIVE g/dL (ref 3.5–5.0)
Lab: NEGATIVE g/dL (ref 6.0–8.0)
Lab: NEGATIVE mg/dL (ref 0.3–1.2)

## 2016-12-16 LAB — GRAM STAIN

## 2016-12-16 LAB — COMPREHENSIVE METABOLIC PANEL
Lab: 134 MMOL/L — ABNORMAL LOW (ref 137–147)
Lab: >60 mL/min — ABNORMAL LOW (ref >60)
Lab: >60 mL/min — ABNORMAL LOW (ref >60)

## 2016-12-16 LAB — CBC AND DIFF
Lab: 0 10*3/uL — ABNORMAL LOW (ref 0–0.20)
Lab: 5.5 10*3/uL (ref 4.5–11.0)

## 2016-12-16 LAB — POC TROPONIN: Lab: 0 ng/mL (ref 0.00–0.05)

## 2016-12-16 LAB — PROTIME INR (PT): Lab: 1.1 M/UL (ref 0.8–1.2)

## 2016-12-16 LAB — GLUCOSE-CSF: Lab: 66 mg/dL (ref 40–75)

## 2016-12-16 LAB — URINALYSIS, MICROSCOPIC

## 2016-12-16 LAB — TSH WITH FREE T4 REFLEX: Lab: 1.9 uU/mL — ABNORMAL LOW (ref 0.35–5.00)

## 2016-12-16 LAB — TOTAL PROTEIN-CSF: Lab: 9 mg/dL — ABNORMAL LOW (ref 15–45)

## 2016-12-16 MED ORDER — FENTANYL CITRATE (PF) 50 MCG/ML IJ SOLN
50 ug | INTRAVENOUS | 0 refills | Status: DC | PRN
Start: 2016-12-16 — End: 2016-12-16

## 2016-12-16 MED ORDER — CALCIUM CARBONATE 200 MG CALCIUM (500 MG) PO CHEW
500 mg | Freq: Two times a day (BID) | ORAL | 0 refills | Status: DC | PRN
Start: 2016-12-16 — End: 2016-12-24

## 2016-12-16 MED ORDER — IMIPRAMINE HCL 25 MG PO TAB
25 mg | Freq: Every day | ORAL | 0 refills | Status: DC
Start: 2016-12-16 — End: 2016-12-17

## 2016-12-16 MED ORDER — HYDROMORPHONE (PF) 2 MG/ML IJ SYRG
1-2 mg | Freq: Once | INTRAVENOUS | 0 refills | Status: DC | PRN
Start: 2016-12-16 — End: 2016-12-16

## 2016-12-16 MED ORDER — OXYCODONE 5 MG PO TAB
5-10 mg | Freq: Once | ORAL | 0 refills | Status: DC | PRN
Start: 2016-12-16 — End: 2016-12-16

## 2016-12-16 MED ORDER — SODIUM CHLORIDE 0.9 % IV SOLP
INTRAVENOUS | 0 refills | Status: DC
Start: 2016-12-16 — End: 2016-12-16

## 2016-12-16 MED ORDER — LIDOCAINE-EPINEPHRINE 1 %-1:100,000 IJ SOLN
30 mL | Freq: Once | INTRAMUSCULAR | 0 refills | Status: AC | PRN
Start: 2016-12-16 — End: ?

## 2016-12-16 MED ORDER — METHOCARBAMOL 750 MG PO TAB
750 mg | Freq: Three times a day (TID) | ORAL | 0 refills | Status: DC | PRN
Start: 2016-12-16 — End: 2016-12-24

## 2016-12-16 MED ORDER — IMIPRAMINE HCL 25 MG PO TAB
25 mg | Freq: Every evening | ORAL | 0 refills | Status: DC
Start: 2016-12-16 — End: 2016-12-24
  Administered 2016-12-17 – 2016-12-24 (×8): 25 mg via ORAL

## 2016-12-16 MED ORDER — CEFAZOLIN INJ 1GM IVP
2 g | INTRAVENOUS | 0 refills | Status: DC
Start: 2016-12-16 — End: 2016-12-23
  Administered 2016-12-16 – 2016-12-23 (×22): 2 g via INTRAVENOUS

## 2016-12-16 MED ORDER — FENTANYL CITRATE (PF) 50 MCG/ML IJ SOLN
25-50 ug | INTRAVENOUS | 0 refills | Status: DC | PRN
Start: 2016-12-16 — End: 2016-12-20

## 2016-12-16 MED ORDER — LIDOCAINE-EPINEPHRINE 1 %-1:100,000 IJ SOLN
0 refills | Status: DC
Start: 2016-12-16 — End: 2016-12-16
  Administered 2016-12-16: 20:00:00 5 mL via SUBCUTANEOUS
  Administered 2016-12-16: 20:00:00 7 mL via SUBCUTANEOUS

## 2016-12-16 MED ORDER — OXYCODONE 5 MG PO TAB
5-10 mg | ORAL | 0 refills | Status: DC | PRN
Start: 2016-12-16 — End: 2016-12-24
  Administered 2016-12-16: 23:00:00 5 mg via ORAL
  Administered 2016-12-22: 21:00:00 10 mg via ORAL
  Administered 2016-12-22: 07:00:00 5 mg via ORAL
  Administered 2016-12-23 – 2016-12-24 (×3): 10 mg via ORAL

## 2016-12-16 MED ORDER — VANCOMYCIN 1,250 MG IVPB
1250 mg | Freq: Two times a day (BID) | INTRAVENOUS | 0 refills | Status: DC
Start: 2016-12-16 — End: 2016-12-16

## 2016-12-16 MED ORDER — FENTANYL CITRATE (PF) 50 MCG/ML IJ SOLN
25-50 ug | INTRAVENOUS | 0 refills | Status: DC | PRN
Start: 2016-12-16 — End: 2016-12-16
  Administered 2016-12-16: 20:00:00 25 ug via INTRAVENOUS

## 2016-12-16 MED ORDER — SPIRONOLACTONE 50 MG PO TAB
25 mg | Freq: Every day | ORAL | 0 refills | Status: DC
Start: 2016-12-16 — End: 2016-12-24
  Administered 2016-12-17 – 2016-12-24 (×8): 25 mg via ORAL

## 2016-12-16 MED ORDER — HALOPERIDOL LACTATE 5 MG/ML IJ SOLN
1 mg | Freq: Once | INTRAVENOUS | 0 refills | Status: DC | PRN
Start: 2016-12-16 — End: 2016-12-17

## 2016-12-16 MED ORDER — CARVEDILOL 12.5 MG PO TAB
12.5 mg | Freq: Two times a day (BID) | ORAL | 0 refills | Status: DC
Start: 2016-12-16 — End: 2016-12-24
  Administered 2016-12-17 – 2016-12-24 (×16): 12.5 mg via ORAL

## 2016-12-16 MED ORDER — LISINOPRIL 5 MG PO TAB
5 mg | Freq: Every day | ORAL | 0 refills | Status: DC
Start: 2016-12-16 — End: 2016-12-24
  Administered 2016-12-17 – 2016-12-24 (×8): 5 mg via ORAL

## 2016-12-16 MED ORDER — PROMETHAZINE 25 MG/ML IJ SOLN
6.25 mg | INTRAVENOUS | 0 refills | Status: DC | PRN
Start: 2016-12-16 — End: 2016-12-17

## 2016-12-16 MED ORDER — FENTANYL CITRATE (PF) 50 MCG/ML IJ SOLN
25 ug | Freq: Once | INTRAVENOUS | 0 refills | Status: CP
Start: 2016-12-16 — End: ?
  Administered 2016-12-16: 20:00:00 25 ug via INTRAVENOUS

## 2016-12-16 MED ORDER — ONDANSETRON HCL (PF) 4 MG/2 ML IJ SOLN
4 mg | INTRAVENOUS | 0 refills | Status: DC | PRN
Start: 2016-12-16 — End: 2016-12-24

## 2016-12-16 MED ORDER — SENNOSIDES-DOCUSATE SODIUM 8.6-50 MG PO TAB
1 | Freq: Two times a day (BID) | ORAL | 0 refills | Status: DC
Start: 2016-12-16 — End: 2016-12-24
  Administered 2016-12-17 – 2016-12-24 (×15): 1 via ORAL

## 2016-12-16 MED ORDER — DIPHENHYDRAMINE HCL 50 MG/ML IJ SOLN
25 mg | Freq: Once | INTRAVENOUS | 0 refills | Status: DC | PRN
Start: 2016-12-16 — End: 2016-12-17

## 2016-12-16 MED ORDER — DOCUSATE SODIUM 100 MG PO CAP
100 mg | Freq: Two times a day (BID) | ORAL | 0 refills | Status: DC
Start: 2016-12-16 — End: 2016-12-24
  Administered 2016-12-17 – 2016-12-24 (×16): 100 mg via ORAL

## 2016-12-16 MED ORDER — FENTANYL CITRATE (PF) 50 MCG/ML IJ SOLN
25 ug | INTRAVENOUS | 0 refills | Status: DC | PRN
Start: 2016-12-16 — End: 2016-12-16

## 2016-12-16 MED ORDER — LEVETIRACETAM 500 MG PO TAB
500 mg | Freq: Two times a day (BID) | ORAL | 0 refills | Status: DC
Start: 2016-12-16 — End: 2016-12-24
  Administered 2016-12-17 – 2016-12-24 (×16): 500 mg via ORAL

## 2016-12-16 MED ORDER — MAGNESIUM HYDROXIDE 2,400 MG/10 ML PO SUSP
10 mL | Freq: Every day | ORAL | 0 refills | Status: DC
Start: 2016-12-16 — End: 2016-12-24
  Administered 2016-12-22 – 2016-12-24 (×3): 10 mL via ORAL

## 2016-12-16 MED ORDER — IMIPRAMINE HCL 25 MG PO TAB
50 mg | Freq: Every day | ORAL | 0 refills | Status: DC
Start: 2016-12-16 — End: 2016-12-24
  Administered 2016-12-17 – 2016-12-24 (×8): 50 mg via ORAL

## 2016-12-16 MED ADMIN — SODIUM CHLORIDE 0.9 % IV SOLP [27838]: INTRAVENOUS | @ 18:00:00 | Stop: 2016-12-16 | NDC 00338004904

## 2016-12-16 NOTE — Consults
Neurosurgery Consult History and Physical Note      Admission Date: 12/16/2016                                                LOS: 0 days    Reason for Consult:  Possible shunt malfunction    Consult type: Opinion with orders    Consulting Physician:  Storm Frisk, MD    Requesting Physician: Dr. Neil Crouch      _____________________________________________________________________    History of Present Illness:   Allen Gill is a 26 y.o.   male with a history of spina bifida and a complex hydrocephalus history.  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  In May 2017 he developed a pleural empyema which necessitated externalization of his distal ventriculoperitoneal shunt catheter.  CSF fluid sent for testing at that time returned with infected.  Thus he had a ventriculostomy placed and bilateral intraventricular shunt catheters were removed.  After treatment with antibiotics and resolution of his CSF infection a new left ventriculoperitoneal shunt was placed with assistance from Dr. Joan Flores, general surgeon.    Around 4 AM this morning the patient developed blurry vision as well as headaches.  He was concerned because the symptoms have been consistent with his shunt revisions in the past.  He arranged to be transported to the ER at [facility].  When he was talking to first responders he also noticed at that point that his speech was slurred which is also concerning symptom for his shunt malfunctions.  He states that other times  his shunt has malfunctioned he is also developed paresthesias and weakness in his extremities.  He has not had this occur yet.  Currently he has a mild headache.    Past Medical History:  Past Medical History:   Diagnosis Date   ??? ADD (attention deficit disorder)     without hyperactivity ??? Arnold-Chiari malformation (HCC)    ??? Headache(784.0)    ??? Hydrocephalus    ??? Infection of VP shunt    ??? Kidney stones     frequent   ??? Myelomeningocele (HCC)    ??? Neurogenic bladder    ??? OSA (obstructive sleep apnea)    ??? 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 Shunt malfunction    ??? Spina bifida (HCC)    ??? Urinary tract infection     frequent       Past Surgical History:  Past Surgical History:   Procedure Laterality Date   ??? HX ABDOMEN SURGERY  01/2001    Cecostomy   ??? SHUNT REVISION  October 2010    [place]   ??? SHUNT REVISION  12/11/09    replacment of valve to acodman hakem adjustablevalve   ??? CATHETER IMPLANT/REVISION  12/27/09    distal end of the catheter was revised   ??? CATHETER IMPLANT/REVISION  01/31/10    replacement of ventricular catheter with [employer] framelessstereotaxis catheter   ??? VENTRICULOSTOMY  02/03/10    removal of all shunt components and placementofright frontal ventriculostomy   ??? SHUNT REVISION  02/17/10    left frontal ventriculopleural shunt   ??? HX ABDOMEN SURGERY  fundoplication   ??? HX BACK SURGERY      repair of spina bifida   ??? HX BRAIN SURGERY  5 months old    Chiari decompression   ??? HX EAR TUBES     ??? HX HERNIA REPAIR      inguinal hernia   ??? HX SURGERY  at 2 weeks    VP Shunt   ??? HX TONSILLECTOMY     ??? HX TRACHEOSTOMY     ??? SHUNT REVISION  3 months old   ??? SHUNT REVISION  26 years old       Social History:  Social History     Tobacco Use   ??? Smoking status: Never Smoker   ??? Smokeless tobacco: Never Used   Substance Use Topics   ??? Alcohol use: No   ??? Drug use: No       Family History:  Family History   Problem Relation Age of Onset   ??? Diabetes Father    ??? Hypertension Father    ??? Other Father         glaucoma   ??? High Cholesterol Maternal Grandfather        Allergies:    Latex; Amoxicillin; Ceclor [cefaclor]; Clindamycin; and Zosyn [piperacillin-tazobactam]    Medications:  PTA:    (Not in a hospital admission)    Inpatient: Scheduled Meds: Continuous Infusions:  ??? sodium chloride 0.9 %   infusion     ??? SODIUM CHLORIDE 0.9 % IV SOLP (Cabinet Override)       PRN and Respiratory Meds:      Review of Systems:  Full 10 point review of systems negative except for HPI    Physical Exam:  Vital Signs:  Last Filed in 24 hours Vital Signs:  24 hour Range    BP: 124/61 (12/06 1027)  Pulse: 75 (12/06 1027)  Respirations: 13 PER MINUTE (12/06 1027)  SpO2: 99 % (12/06 1027)  O2 Delivery: None (Room Air) (12/06 1610)  SpO2 Pulse: 75 (12/06 1027) BP: (124-138)/(61-73)   Pulse:  [75-86]   Respirations:  [13 PER MINUTE-15 PER MINUTE]   SpO2:  [97 %-99 %]   O2 Delivery: None (Room Air)     General appearance: No acute distress  Lungs: nonlabored breathing  Heart: warm and well perfused.   Gastrointestinal: Ureteral collection bad, abdominal incision well healed.  Musculoskeletal: No edema, redness or tenderness in the calves or thighs  Skin: Integument intact without major lesion.  Psychiatric: Normal affect    Neurologic Exam:   For his neurological exam the patient is bright awake and alert.  He is fully oriented to year, location, self, and others.  He is oriented to reason for hospital visit.  His speech is fluent and appropriate.  Cranial nerve examination for cranial nerves II through XII does not reveal any deficits.  His motor strength is 5 out of 5 in the bilateral upper extremities.  In the lower extremities he is able to perform hip flexion knee extension and knee flexion with 5 out of 5 strength.  He has orthoses on both ankles due to of history of spina bifida and his plantar flexion is weak bilaterally.  His sensation is intact to light touch throughout.    LabTests:  Hematology:  Lab Results   Component Value Date    HGB 12.3 12/16/2016    HCT 37.9 12/16/2016    PLTCT 337 12/16/2016    WBC 5.5 12/16/2016    NEUT 62  12/16/2016    ANC 3.40 12/16/2016    ALC 1.50 12/16/2016    MONA 8 12/16/2016    AMC 0.40 12/16/2016    ABC 0.00 12/16/2016 MCV 83.1 12/16/2016    MCHC 32.6 12/16/2016    MPV 9.0 12/16/2016    RDW 16.1 12/16/2016     General Chemistry: Lab Results   Component Value Date    NA 134 12/16/2016    K 4.3 12/16/2016    CL 103 12/16/2016    CO2 23 12/16/2016    BUN 15 12/16/2016    CR 0.72 12/16/2016    GLU 106 12/16/2016    GLU 73 08/30/2016    CA 9.5 12/16/2016    MG 1.8 10/17/2016    PO4 2.9 10/15/2016     General Chemistry:   Lab Results   Component Value Date    GAP 8 12/16/2016    ALBUMIN 4.3 12/16/2016    LACTIC 2.4 08/29/2015    TOTBILI 0.3 12/16/2016    TOTPROT 7.3 12/16/2016    LIPASE 12 04/17/2016    AST 17 12/16/2016    ALT 25 12/16/2016    ALKPHOS 97 12/16/2016    LDH 238 05/20/2015       Radiology and other Diagnostics Review:    CT head today compared with CT head from November 2017 which was obtained when patient's shunt was working properly. Left ventriculoperitoneal shunt catheter seen in appropriate location extending into left frontal horn. Diffuse enlargement of lateral, 3rd, and 4th ventricles, poor sulcal differentiation consistent with shunt malfunction. Shunt series xrays were reviewed which did not show any obvious kinking, discontinuity, or break of shunt catheters.     Assessment/Plan:  Edoardo Erstad is a 26 y.o. male with complicated hydrocephalus history and spina bifida who presents with left ventriculoperitoneal shunt malfunction.  - Admit to neurosurgery  - OR for shunt revision on urgent basis.  - Dr. Joan Flores, general surgery, available for OR  - Abdominal US for evaluation of pseudocyst  - Frequent neuro checks  - Discussed with staff.    Randal Buba, MD    Please call [phone] with questions.

## 2016-12-16 NOTE — ED Notes
All belongings gathered and placed in belonging bag with patient labels at bedside. The bag(s) contain(s) the following:    Clothing: shirt, pants, underwear  Shoes: yes  Jewelry: none  Identification/Drivers license: yes  Cash: na  Credit cards: na  Electronics: cell phone  Dentures/Glasses/Hearing aids:glasses  Assistive devices: two leg braces  Other: back pack, keys, life alert    All belongings placed in 2 bag(s).    Belongings disposition:  with patient at bedside

## 2016-12-16 NOTE — Progress Notes
Pt arrived on unit via cart accompanied by staff. Pt transferred to the bed via sliding device and full assistance. Assessment completed. Alert and oriented x4. Speech somewhat slurred and continuing to complain of blurred vision. Regular rate and rhythm, lungs clear bilaterally, bowel sounds present. Pain 6/10. Wounds clean, dry, and intact. Orders released, reviewed, and implemented as appropriate. Oriented to surroundings, call light within reach, plan of care reviewed. Will continue to monitor and assess.

## 2016-12-16 NOTE — Other
Brief Operative Note    Name: Allen Gill is a 26 y.o. male     DOB: October 24, 1990             MRN#: [id number]  DATE OF OPERATION: 12/16/2016    Date:  12/16/2016        Preoperative Dx:   Headaches, concern for hydrocephalus    Procedure(s) (LRB):  REVISION SHUNT - VENTRICULO-PERITONEAL (Left)    Anesthesia Type: Local    Surgeon(s) and Role:     * Storm Frisk, MD - Primary     * Gracy Bruins, MD - Resident - Assisting     * Wenda Overland, MD - Resident - Assisting     * Chatley, Maryagnes Amos, MD - Resident - Observing      Findings: Distal catheter at the clavicle identified removed the distal portion form the abodmen and conntected to EVD. Clear csf fluid drained from the catheter. CSF fluid sent for studies.    Estimated Blood Loss: No blood loss documented.     Specimen(s) Removed/Disposition:   ID Type Source Tests Collected by Time Destination   A : CSF Cerebrospinal fluid Ventricular Csf GLUCOSE-CSF, TOTAL PROTEIN-CSF, CELL COUNT W/DIFF-CSF, CULTURE-CSF W/SENSITIVITY, CULTURE-FUNGAL,CSF, Maurine Simmering, MD 12/16/2016 1446        Complications:  None    Implants: Externalized VP shunt cathter to EVD system at bedside    Drains: EVD system    Disposition:  PACU - stable    Gracy Bruins, MD  Pager  [phone]

## 2016-12-16 NOTE — ED Notes
Patient comes to the ED with report of a headache that started last night, then approx 0415 this morning he began having difficulty ambulating, slurred speech, and blurred vision in both eyes. Reports similar symptoms in the past when his VP shunt has malfunctioned. Patient reports after speaking with his parents he was going to drive in but had an increase in his symptoms and used his life alert button. Patient denies numbness/tingling in extremities, reports generalized weakness but equal on both sides, denies fever, denies N/V.

## 2016-12-17 ENCOUNTER — Inpatient Hospital Stay: Admit: 2016-12-17 | Discharge: 2016-12-17 | Payer: MEDICARE

## 2016-12-17 ENCOUNTER — Encounter: Admit: 2016-12-17 | Discharge: 2016-12-17 | Payer: MEDICARE

## 2016-12-17 DIAGNOSIS — Q0700 Arnold-Chiari syndrome without spina bifida or hydrocephalus: ICD-10-CM

## 2016-12-17 DIAGNOSIS — R569 Unspecified convulsions: ICD-10-CM

## 2016-12-17 DIAGNOSIS — N390 Urinary tract infection, site not specified: ICD-10-CM

## 2016-12-17 DIAGNOSIS — Q059 Spina bifida, unspecified: Principal | ICD-10-CM

## 2016-12-17 DIAGNOSIS — G919 Hydrocephalus, unspecified: ICD-10-CM

## 2016-12-17 DIAGNOSIS — T8501XA Breakdown (mechanical) of ventricular intracranial (communicating) shunt, initial encounter: Principal | ICD-10-CM

## 2016-12-17 DIAGNOSIS — R51 Headache: ICD-10-CM

## 2016-12-17 DIAGNOSIS — N200 Calculus of kidney: ICD-10-CM

## 2016-12-17 DIAGNOSIS — F988 Other specified behavioral and emotional disorders with onset usually occurring in childhood and adolescence: ICD-10-CM

## 2016-12-17 DIAGNOSIS — N319 Neuromuscular dysfunction of bladder, unspecified: ICD-10-CM

## 2016-12-17 DIAGNOSIS — T85618A Breakdown (mechanical) of other specified internal prosthetic devices, implants and grafts, initial encounter: ICD-10-CM

## 2016-12-17 DIAGNOSIS — G4733 Obstructive sleep apnea (adult) (pediatric): ICD-10-CM

## 2016-12-17 LAB — BASIC METABOLIC PANEL: Lab: 137 MMOL/L — ABNORMAL LOW (ref 137–147)

## 2016-12-17 LAB — CELL COUNT W/DIFF-CSF: Lab: 2 /uL (ref <5)

## 2016-12-17 LAB — CBC AND DIFF: Lab: 7.7 K/UL (ref >60)

## 2016-12-17 MED ORDER — FENTANYL CITRATE (PF) 50 MCG/ML IJ SOLN
25-50 ug | Freq: Once | INTRAVENOUS | 0 refills | Status: CP
Start: 2016-12-17 — End: ?
  Administered 2016-12-17: 21:00:00 50 ug via INTRAVENOUS

## 2016-12-17 MED ORDER — MIDAZOLAM 1 MG/ML IJ SOLN
1 mg | Freq: Once | INTRAVENOUS | 0 refills | Status: CP
Start: 2016-12-17 — End: ?
  Administered 2016-12-17: 21:00:00 1 mg via INTRAVENOUS

## 2016-12-17 NOTE — H&P (View-Only)
Interventional Radiology Consult Note with Pre-procedural History and Physical      Admission Date: 12/16/2016  LOS: 1 day                       Active Problems:    Spina bifida (HCC)    Shunt malfunction    Hydrocephalus    HTN (hypertension)    Neurogenic bladder      Reason for consult: Abdominal pseudocyst.     Assessment/Plan/Procedure: Abdominal drain placement into pseudocyst.     Procedure Date: 12/17/2016     IR Pre Procedure Notes:  Attempt [place] and move to CT if needed.   __________________________________________________________________    Chief Complaint:  Abdominal pseudocyst.     Previous Anesthetic/Sedation History:  Reviewed.    History of present illness:  Allen Gill is a 26 y.o. male patient admitted for shunt malfunction and found to have abdominal pseudocyst.     Review of Systems  All other systems reviewed and are negative.    Medications  Scheduled Meds:  [MAR Hold] carvedilol (COREG) tablet 12.5 mg 12.5 mg Oral BID   ceFAZolin (ANCEF) IVP 2 g 2 g Intravenous Q8H*   [MAR Hold] docusate (COLACE) capsule 100 mg 100 mg Oral BID   [MAR Hold] imipramine (TOFRANIL) tablet 25 mg 25 mg Oral QHS   [MAR Hold] imipramine (TOFRANIL) tablet 50 mg 50 mg Oral QDAY   levETIRAcetam (KEPPRA) tablet 500 mg 500 mg Oral BID   [MAR Hold] lisinopril (PRINIVIL; ZESTRIL) tablet 5 mg 5 mg Oral QDAY   [MAR Hold] milk of magnesia (CONC) oral suspension 10 mL 10 mL Oral QDAY   [MAR Hold] senna/docusate (SENOKOT-S) tablet 1 tablet 1 tablet Oral BID   [MAR Hold] spironolactone (ALDACTONE) tablet 25 mg 25 mg Oral QDAY   Continuous Infusions:  PRN and Respiratory Meds:[MAR Hold] calcium carbonate BID PRN, [MAR Hold] fentaNYL citrate PF Q1H PRN, [MAR Hold] methocarbamol TID PRN, [MAR Hold] ondansetron (ZOFRAN) IV Q6H PRN, [MAR Hold] oxyCODONE Q4H PRN      Objective                       Vital Signs: Last Filed                 Vital Signs: 24 Hour Range   BP: 120/63 (12/07 1212)  Temp: 36.7 ???C (98 ???F) (12/07 1212) Pulse: 72 (12/07 1212)  Respirations: 15 PER MINUTE (12/07 1212)  SpO2: 96 % (12/07 1212)  O2 Delivery: None (Room Air) (12/07 1212)  SpO2 Pulse: 76 (12/06 1440) BP: (104-141)/(55-82)   Temp:  [36.6 ???C (97.9 ???F)-37.1 ???C (98.8 ???F)]   Pulse:  [62-118]   Respirations:  [14 PER MINUTE-22 PER MINUTE]   SpO2:  [87 %-100 %]   O2 Delivery: None (Room Air)   Intensity Pain Scale (Self Report): 0 (12/17/16 1100) Vitals:    12/16/16 1204   Weight: 95.3 kg (210 lb)         Intake/Output Summary:  (Last 24 hours)    Intake/Output Summary (Last 24 hours) at 12/17/2016 1318  Last data filed at 12/17/2016 1257  Gross per 24 hour   Intake 380 ml   Output 2039 ml   Net -1659 ml      Stool Occurrence: 0    Physical Exam  General:  Alert, cooperative, no distress, appears stated age  Lungs:  Non-labored  Heart:    Regular rate and rhythm,  S1, S2 normal, no murmur, click rub or gallop    Airway:  airway assessment performed  Mallampati III (soft palate, base of uvula visible)   Anesthesia Classification:  ASA III (A patient with a severe systemic disease that limits activity, but is not incapacitating)  Sedation/Medication Plan: Fentanyl and Midazolam  Personal history of sedation complications: Denies adverse event.   Family history of sedation complications: Denies adverse event.   Medications for Reversal: Naloxone and Flumazenil  Discussion/Reviews:  Physician has discussed risks and alternatives of this type of sedation and above planned procedures with patient  NPO Status: Acceptable  Pregnancy Status: N/A    Lab/Radiology/Other Diagnostic Tests:  Labs:    24-hour labs:    Results for orders placed or performed during the hospital encounter of 12/16/16 (from the past 24 hour(s))   GLUCOSE-CSF    Collection Time: 12/16/16  2:46 PM   Result Value Ref Range    Glucose,CSF 66 40 - 75 MG/DL    Xanthrochromia,CSF NONE    TOTAL PROTEIN-CSF    Collection Time: 12/16/16  2:46 PM   Result Value Ref Range Total Protein,CSF 9 (L) 15 - 45 MG/DL   CELL COUNT W/DIFF-CSF    Collection Time: 12/16/16  2:46 PM   Result Value Ref Range    Cell Count Tube,CSF CUP, VENTRICULAR CSF     White Blood Cells,CSF 2 <5 /UL    Red Blood Cells,CSF 1 /UL    Neutrophils, CSF 10 %    Lymphocytes, CSF 27 %    Monocyte/Hisotocyte, CSF 63 %    Clarity,CSF CLEAR     Path Interpretation, CSF NO DIAGNOSTIC ABNORMALITIES     Pathologist Signature       INTERPRETED BY FRED PLAPP M.D.  By the PATH SIGNATURE ABOVE, I attest that I have personally formulated the   final interpretation expressed in this report and that the above diagnosis is   based upon my examination of the slides and/or other material indicated in this   report.     CULTURE-CSF W/SENSITIVITY    Collection Time: 12/16/16  2:46 PM   Result Value Ref Range    Battery Name CSF CULTURE     Specimen Description VENTRICULAR CSF     Special Requests NONE     Direct Gram Stain RARE  NEUTROPHILS  NO ORGANISMS SEEN       Culture NO GROWTH 1 DAY     Report Status     GRAM STAIN    Collection Time: 12/16/16  2:46 PM   Result Value Ref Range    Battery Name GRAM STAIN      Specimen Description VENTRICULAR CSF     Special Requests NONE     Gram Stain RARE  NEUTROPHILS  NO ORGANISMS SEEN       Report Status FINAL  12/16/2016      BASIC METABOLIC PANEL    Collection Time: 12/17/16  5:09 AM   Result Value Ref Range    Sodium 137 137 - 147 MMOL/L    Potassium 4.3 3.5 - 5.1 MMOL/L    Chloride 106 98 - 110 MMOL/L    CO2 26 21 - 30 MMOL/L    Anion Gap 5 3 - 12    Glucose 111 (H) 70 - 100 MG/DL    Blood Urea Nitrogen 13 7 - 25 MG/DL    Creatinine 1.61 0.4 - 1.24 MG/DL    Calcium 9.1 8.5 - 09.6 MG/DL    eGFR  Non African American >60 >60 mL/min    eGFR African American >60 >60 mL/min   CBC AND DIFF    Collection Time: 12/17/16  5:09 AM   Result Value Ref Range    White Blood Cells 7.7 4.5 - 11.0 K/UL    RBC 4.24 (L) 4.4 - 5.5 M/UL    Hemoglobin 11.6 (L) 13.5 - 16.5 GM/DL    Hematocrit 16.1 (L) 40 - 50 % MCV 82.5 80 - 100 FL    MCH 27.3 26 - 34 PG    MCHC 33.2 32.0 - 36.0 G/DL    RDW 09.6 (H) 11 - 15 %    Platelet Count 292 150 - 400 K/UL    MPV 8.7 7 - 11 FL    Neutrophils 58 41 - 77 %    Lymphocytes 33 24 - 44 %    Monocytes 7 4 - 12 %    Eosinophils 2 0 - 5 %    Basophils 0 0 - 2 %    Absolute Neutrophil Count 4.40 1.8 - 7.0 K/UL    Absolute Lymph Count 2.60 1.0 - 4.8 K/UL    Absolute Monocyte Count 0.60 0 - 0.80 K/UL    Absolute Eosinophil Count 0.20 0 - 0.45 K/UL    Absolute Basophil Count 0.00 0 - 0.20 K/UL    and Pertinent labs reviewed  Radiology: Reviewed.           We appreciate being able to participate in [facility] care. Please page with any questions or concerns.    Nicolette Bang, APRN,ANP-BC   Pgr [phone]    IR Team Pager [phone] (After-hours and Weekends)

## 2016-12-17 NOTE — Progress Notes
Neurosurgery Progress Note      Admission Date: 12/16/2016 LOS: 1 day  ______________________________________________________________    S: No acute events noted overnight, discussed plan for IR drainage of abdominal pseudocyst today. Reports improvement in symptoms post externalization of shunt.        O:                  Vital Signs: 24 Hour Range   BP: (104-141)/(55-82)   Temp:  [36.6 C (97.9 F)-37.1 C (98.8 F)]   Pulse:  [62-118]   Respirations:  [14 PER MINUTE-22 PER MINUTE]   SpO2:  [87 %-100 %]   O2 Delivery: None (Room Air)     Physical Exam:  Awake and alert; oriented x 3  AROM all extremities  Strength symmetric  Left externalized shunt intact with dressing  - 174 ml output overnight      A/P: 26 y.o. male s/p externalization of ventriculoperitoneal shunt  Active Problems:    Spina bifida (HCC)    Shunt malfunction    Hydrocephalus    HTN (hypertension)    Neurogenic bladder    Keep NPO  IR today for drainage of abdominal cyst  Monitor externalized shunt drainage  - Keep on ancef  Cx CSF No growth x 1 day  Resume diet after procedure today  Mobilize PT/OT  Pain control PRN  Continue PTA Keppra  Plan evolving      Prophylaxis:   A) GI: PPI  B) Lines:  No  C) Urinary Catheter:  No  D) Antibiotic Usage:  Yes; Infection present or suspected:  Externalized shunt ppx  E) VTE:  Mechanical prophylaxis; Sequential compression device  F) Restraints: Patient assessed for need for restraints.     Please call [phone] with any questions.    Avel Peace, APRN  Pager [phone]

## 2016-12-17 NOTE — Progress Notes
Pt transported to [facility] by this RN. VSS, no s/s of acute distress noted. Bed in low position, call light in place. EVD leveled. Report given to Hector Shade RN.

## 2016-12-17 NOTE — Anesthesia Pain Rounding
Anesthesia Follow-Up Evaluation: Post-Procedure Day One    Name: Allen Gill     MRN: [id number]     DOB: August 19, 1990     Age: 26 y.o.     Sex: male   __________________________________________________________________________     Procedure Date: 12/16/2016   Procedure: Procedure(s) with comments:  REVISION SHUNT - VENTRICULO-PERITONEAL - CASE LENGTH 1.5 HOURS    Physical Assessment  Height: 162.6 cm (64)  Weight: 95.3 kg (210 lb)    Vital Signs (Last Filed in 24 hours)  BP: 121/67 (12/07 0804)  Temp: 36.9 ???C (98.4 ???F) (12/07 0804)  Pulse: 72 (12/07 0804)  Respirations: 16 PER MINUTE (12/07 0804)  SpO2: 99 % (12/07 0804)  O2 Delivery: None (Room Air) (12/07 0804)  SpO2 Pulse: 76 (12/06 1440)  Height: 162.6 cm (64) (12/06 1204)    Patient History   Allergies  Allergies   Allergen Reactions   ??? Latex RASH and SHORTNESS OF BREATH   ??? Amoxicillin RASH and STOMACH UPSET   ??? Ceclor [Cefaclor] HIVES     Pt has tolerated ancef, keflex, and cefepime   ??? Clindamycin HIVES   ??? Zosyn [Piperacillin-Tazobactam] HIVES        Medications  Scheduled Meds:  carvedilol (COREG) tablet 12.5 mg 12.5 mg Oral BID   ceFAZolin (ANCEF) IVP 2 g 2 g Intravenous Q8H*   docusate (COLACE) capsule 100 mg 100 mg Oral BID   imipramine (TOFRANIL) tablet 25 mg 25 mg Oral QHS   imipramine (TOFRANIL) tablet 50 mg 50 mg Oral QDAY   levETIRAcetam (KEPPRA) tablet 500 mg 500 mg Oral BID   lisinopril (PRINIVIL; ZESTRIL) tablet 5 mg 5 mg Oral QDAY   milk of magnesia (CONC) oral suspension 10 mL 10 mL Oral QDAY   senna/docusate (SENOKOT-S) tablet 1 tablet 1 tablet Oral BID   spironolactone (ALDACTONE) tablet 25 mg 25 mg Oral QDAY   Continuous Infusions:  PRN and Respiratory Meds:calcium carbonate BID PRN, fentaNYL citrate PF Q1H PRN, methocarbamol TID PRN, ondansetron (ZOFRAN) IV Q6H PRN, oxyCODONE Q4H PRN      Diagnostic Tests  Hematology: Lab Results   Component Value Date    HGB 11.6 12/17/2016    HCT 35.0 12/17/2016    PLTCT 292 12/17/2016 WBC 7.7 12/17/2016    NEUT 58 12/17/2016    ANC 4.40 12/17/2016    ALC 2.60 12/17/2016    MONA 7 12/17/2016    AMC 0.60 12/17/2016    EOSA 2 12/17/2016    ABC 0.00 12/17/2016    MCV 82.5 12/17/2016    MCH 27.3 12/17/2016    MCHC 33.2 12/17/2016    MPV 8.7 12/17/2016    RDW 16.2 12/17/2016         General Chemistry: Lab Results   Component Value Date    NA 137 12/17/2016    K 4.3 12/17/2016    CL 106 12/17/2016    CO2 26 12/17/2016    GAP 5 12/17/2016    BUN 13 12/17/2016    CR 0.78 12/17/2016    GLU 111 12/17/2016    GLU 73 08/30/2016    CA 9.1 12/17/2016    ALBUMIN 4.3 12/16/2016    LACTIC 2.4 08/29/2015    MG 1.8 10/17/2016    TOTBILI 0.3 12/16/2016    PO4 2.9 10/15/2016      Coagulation:   Lab Results   Component Value Date    PT 13.2 03/06/2002    PTT 26.6 08/29/2015    INR  1.1 12/16/2016         Follow-Up Assessment  Patient location during evaluation: floor      Anesthetic Complications:   Anesthetic complications: The patient did not experience any anesthestic complications.      Pain:  Score: 0    Management:adequate     Level of Consciousness: awake   Hydration:acceptable     Airway Patency: patent   Respiratory Status: acceptable, room air and spontaneous ventilation     Cardiovascular Status:acceptable   Regional/Neuroaxial:

## 2016-12-17 NOTE — Progress Notes
Per Dr Jena Gauss, unable to find pseudocyst via ultrasound. Plan to move to CT when room 5 is available. Pt VSS, no s/s of acute distress. Will continue to monitor.

## 2016-12-17 NOTE — Operative Report(Direct Entry)
NEUROSURGERY OPERATIVE REPORT    [facility]  [address]  [place], [place]??? [id number]  ________________________________________________________    PATIENT NAME:??? Allen Gill    MR#/PT#:??? [id number]    DATE OF OPERATION:??? 12/16/16    SURGEON:??? ???  Storm Frisk, MD    CO-SURGEON:??? ???  None    ASSISTANT(S):  Wenda Overland, MD  Gracy Bruins, MD  Ross Marcus, MD    PREOPERATIVE DIAGNOSIS:  ??????  1. VP Shunt malfunction  2. Hydrocephalus  3. Abdominal pseudocyst    POSTOPERATIVE DIAGNOSIS:  Same.    OPERATIVE PROCEDURE:  ??????  1. Revision of cerebrospinal fluid shunt  2. Externalization of ventriculoperitoneal shunt    ANESTHESIA:  Local    INDICATIONS FOR OPERATIVE PROCEDURE:??? Please see full dictated H and P.???Briefly, Patient has a history of spina bifida complex hydrocephalus history with a revision in May 2017 in which a ventriculopleural shunt was eventually externalized and changed to a ventral peritoneal shunt after infection of his shunt.  Patient then presented to the emergency room on the day of admission around the morning with complaints of some blurry vision as well as headaches.  These are symptoms which were consistent with shunt malfunctions in the past.  He had a head CT that was performed which showed some mild increase in the ventricle size in comparison to his previous imaging.  An ultrasound of the abdomen was performed which revealed evidence of some fluid collection suggestive of abdominal pseudocysts around the distal catheter in the abdomen.    DESCRIPTION AND FINDINGS OF OPERATIVE PROCEDURE:??? Findings:??? Consistent with preoperative diagnosis.    Consent was obtained from the patient and his mother.  Benefits and risks of the procedure were discussed and patient wished to proceed.  The left ventricular catheter was first palpated above the left clavicle easily.  Imaging was reviewed which confirmed a left-sided VP shunt with the distal catheter passing over the left clavicle towards the peritoneum.  A small area around the left clavicle was scrubbed with chlorhexidine, alcohol, and DuraPrep.  Patient was given 25 mcg of fentanyl.  A timeout was performed.  The area around the clavicle was draped with blue or towels in a sterile fashion.  Lidocaine with epinephrine was injected with subcutaneous tissue above the left clavicle.  A 10 blade was used to make a small 2 cm incision above the left clavicle around the area that the catheter was palpated.  Blunt dissection was performed with curved hemostats to the level of the clavicle.  The catheter was identified.  Further blunt dissection was performed proximally distal to the catheter in order to release it from local adhesions.  The distal catheter was then easily removed from the abdominal portion and externalized.  The distal catheter was draining clear fluid suggestive of CSF.  It was only draining out of 1 or 2 of the more proximal perforated holes.  The distal end of the catheter was cut and CSF fluid was collected for studies.  The externalized distal portion of the catheter was then connected to an external ventricular system.  A connector was used which was tied with a silk suture.  The small incision was closed with interrupted 4-0 Monocryl stitches and Tegaderm was placed above the area to secure the catheter and cover the incision.  Vital signs were observed throughout the case which remained stable.       ESTIMATED BLOOD LOSS:??? Minimal    SPECIMENS REMOVED:??? Distal catheter tip  DRAINS:??? Externalized cathter tip to external EVD drainage system    IMPLANTS:??? None    COMPLICATIONS:??? None    Attending:   Storm Frisk, MD    Dictated by:??? Gracy Bruins, MD

## 2016-12-17 NOTE — Progress Notes
Sedation physician present in room. Recent vitals and patient condition reviewed between sedating physician and nurse. Reassessment completed. Determination made to proceed with planned sedation.

## 2016-12-17 NOTE — Case Management (ED)
NCM stopped by patient's room for CM assessment; Allen Gill is currently in IR. Will follow up for assessment when patient is available.    Williemae Area, BSN, Conservation officer, nature  [phone], [phone]

## 2016-12-17 NOTE — Progress Notes
Moved to CT room 5 at this time. Will obtain imaging and have IR physician reassess need for pseudocyst aspiration.

## 2016-12-17 NOTE — Progress Notes
CT imaging reviewed by Dr Jena Gauss and Dr Val Eagle. Neurosurgery paged at 1550 and 1554. Awaiting return phone call.

## 2016-12-17 NOTE — Progress Notes
Dr Jena Gauss discussing IR findings with neurosurgery at this time. Procedure aborted.

## 2016-12-18 LAB — CBC AND DIFF: Lab: 7.4 K/UL — ABNORMAL LOW (ref >60)

## 2016-12-18 LAB — BASIC METABOLIC PANEL: Lab: 137 MMOL/L — ABNORMAL HIGH (ref >60)

## 2016-12-18 NOTE — Progress Notes
12/18/16 0022   Vitals   Temp 36.7 C (98.1 F)   Temperature Source Oral   Pulse 94   Respirations 18 PER MINUTE   SpO2 94 %   O2 Delivery RA   BP (!) 89/54   Mean NBP (Calculated) 66 MM HG   BP Source Arm, Right Upper   BP Patient Position Head of Bed (Comment Degree)  (30)   RN notified Dr. Octaviano Batty of above BP. No new orders at this time.  @0055  BP 91/54. Will continue to monitor.

## 2016-12-19 ENCOUNTER — Encounter: Admit: 2016-12-19 | Discharge: 2016-12-19 | Payer: MEDICARE

## 2016-12-19 DIAGNOSIS — T8501XA Breakdown (mechanical) of ventricular intracranial (communicating) shunt, initial encounter: Principal | ICD-10-CM

## 2016-12-19 LAB — BASIC METABOLIC PANEL: Lab: 137 MMOL/L — ABNORMAL LOW (ref >60)

## 2016-12-19 LAB — CULTURE-CSF W/SENSITIVITY

## 2016-12-19 LAB — CBC AND DIFF: Lab: 6.9 K/UL — ABNORMAL LOW (ref 4.5–11.0)

## 2016-12-19 MED ORDER — SODIUM CHLORIDE 0.9 % IJ SOLN
50 mL | Freq: Once | INTRAVENOUS | 0 refills | Status: CP
Start: 2016-12-19 — End: ?
  Administered 2016-12-19: 17:00:00 50 mL via INTRAVENOUS

## 2016-12-19 MED ORDER — IOPAMIDOL 76 % IV SOLN
100 mL | Freq: Once | INTRAVENOUS | 0 refills | Status: CP
Start: 2016-12-19 — End: ?
  Administered 2016-12-19: 17:00:00 100 mL via INTRAVENOUS

## 2016-12-19 NOTE — Progress Notes
OCCUPATIONAL THERAPY  ASSESSMENT NOTE    Patient Name: Allen Gill                   Room/Bed: UE4540/98  Admitting Diagnosis:       Past Medical History:   Diagnosis Date   ??? ADD (attention deficit disorder)     without hyperactivity   ??? Arnold-Chiari malformation (HCC)    ??? Headache(784.0)    ??? Hydrocephalus    ??? Infection of VP shunt    ??? Kidney stones     frequent   ??? Myelomeningocele (HCC)    ??? Neurogenic bladder    ??? OSA (obstructive sleep apnea)    ??? 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 Shunt malfunction    ??? Spina bifida (HCC)    ??? Urinary tract infection     frequent       Mobility  Progressive Mobility Level: Walk in room  Distance Walked (feet): 25 ft  Level of Assistance: Assist X1  Assistive Device: (IV pole)  Time Tolerated: 11-30 minutes  Activity Limited By: Weakness;Fatigue    Subjective  Pertinent Dx per Physician: 26 y.o. male s/p externalization of ventriculoperitoneal shunt. Past history of spina bifida, hernia repair, seizures, shunt infection  Precautions: Standard;Falls(external shunt; no need for clamping prior to mobility)  R LE Precautions: (AFO)  L LE Precautions: (AFO)  Comments: Patient with external shunt, Charge RN reports this does not need adjusted for ambulation  Pain / Complaints: Patient has no c/o pain;Patient agrees to participate in therapy  Pain Level Current: No pain  Comments: Discussed mobility protocol with RN/resident in afternoon of 12/08. Pt with external shunt, per team, no need to clamp for mobility. Pt seated in chair at start and end of therapy with needs addressed and within reach. RN notified of status.    Objective  Psychosocial Status: Willing and Cooperative to Participate  Persons Present: Education administrator;Family    Home Living  Type of Home: Apartment  Home Layout: (no stairs)  Financial risk analyst / Tub: Medical sales representative: Standard  Bathroom Equipment: Tub Writer: Walker;Cane    Prior Function Level Of Independence: Independent with ADLs and functional transfers;Independent with homemaking w/ ambulation  Lives With: Alone  Receives Help From: None Needed  Homemaking Tasks: Meal Prep;Laundry;Cleaning;Driving;Shopping  Vocational: Full Time Employment  Other Function Comments: Pt reports he is typically independent with basic ADLs and functional transfers prior to admission. Plans to d/c to parents house at discharge (mom works full time, father unable to provide physical assistance).     Vision  Comment: No acute changes.    ADL's  Where Assessed: Standing at [facility];Chair  Grooming Assist: Stand By Assist  Grooming Deficits: Supervision/Safety;Wash/Dry Hands;Wash/Dry Face;Teeth Care  Bathing Assist: Minimal Assist  Bathing Deficits: (back)  Toileting Assist: Total Assist  Functional Transfer Assist: Minimal Assist  Functional Transfer Deficits: Supervision/Safety  Comment: Minimal assist for sit>stand transfers. Ambulates in room with minimal assist with IV pole. Participates in grooming at the sink with standby assist. Pt HR up to 130 with activity. Assisted back into chair, RN notified.     Activity Tolerance  Endurance: 3/5 Tolerates 25-30 Minutes Exercise w/Multiple Rests  Sitting Balance: 3+/5 Sits w/o UE Support for 30 Seconds or Greater    Cognition  Overall Cognitive Status: WFL to Adequately Complete Self Care Tasks Safely    UE AROM  Overall BUE AROM  WNL: Yes  Coordination: Adequate to Complete ADLs  Grasp: Bilateral Grasp Functional for Activity    UE Strength / Tone  Overall Strength / Tone: WFL Able to Perform ADL Tasks    Education  Persons Educated: Patient/Family  Barriers To Learning: None Noted  Teaching Methods: Verbal Instruction  Patient Response: Verbalized and Demo Understanding  Topics: Role of OT, Goals for Therapy  Goal Formulation: With Patient/Family    Assessment  Assessment: Decreased ADL Status;Decreased Safe/Judg during ADL;Decreased Endurance;Decreased Self-Care Trans;Decreased High-Level ADLs  Prognosis: Good  Goal Formulation: Pt/family    AM-PAC 6 Clicks Daily Activity Inpatient  Putting on and taking off regular lower body clothes?: A Little  Bathing (Including washing, rinsing, drying): A Little  Toileting, which includes using toilet, bedpan, or urinal: A Little  Putting on and taking off regular upper body clothing: None  Taking care of personal grooming such as brushing teeth: None  Eating meals?: None  Daily Activity Raw Score: 21  Standardized (t-scale) score: 44.27  CMS 0-100% Score: 32.79  CMS G Code Modifier: CJ    Plan  OT Frequency: 2-3x/week  OT Plan for Next Visit: Plan for OR this week, progress functional mobility for endurance, LE dressing (bilateral AFOs/shoes), toileting.    ADL Goals  Patient Will Perform Grooming: Standing at Sink;w/ Stand By Assist  Patient Will Perform LE Dressing: In Chair;w/ Stand By Assist  Patient Will Perform Toileting: w/ Stand By Assist    Functional Transfer Goals  Pt Will Perform All Functional Transfers: w/ Stand By Assist    G-Codes: Self-care  [phone] Current Status:  20-39% Impairment  [id number] Goal Status:  0% Impairment    Based on above evaluation and clinical judgment.    OT Discharge Recommendations  OT Discharge Recommendations: Home with family assist (likely, will re-assess post-operatively)  Equipment Recommendations: Patient owns necessary equipment    Therapist: Hayden Rasmussen, OTR/L [id number]  Date: 12/19/2016

## 2016-12-19 NOTE — Progress Notes
PHYSICAL THERAPY  ASSESSMENT     MOBILITY:  Mobility  Progressive Mobility Level: Walk in hallway  Distance Walked (feet): 100 ft  Level of Assistance: Assist X1  Assistive Device: (IV pole)  Time Tolerated: 0-10 minutes  Activity Limited By: Weakness;Fatigue    SUBJECTIVE:  Subjective  Significant hospital events: 26 y.o. male s/p externalization of ventriculoperitoneal shunt. Past history of spina bifida, hernia repair, seizures, shunt infection  Mental / Cognitive Status: Alert;Oriented  Pain: Patient has no complaint of pain  Comments: Patient with external shunt, Charge RN reports this does not need adjusted for ambulation  Comments: BLE AFOs  Ambulation Assist: Independent Mobility in [employer] without Device  Patient Owned Equipment: Education officer, community [employer] Situation: Lives Alone  Type of Home: Apartment  Entry Stairs: No Stairs  Comments: Patient often discharges to his parents home after hospitalizations    STRENGTH:  Strength  Overall Strength: WFL    BED MOBILITY/TRANSFERS:  Bed Mobility/Transfers  Transfer Type: Sit to/from Stand  Transfer: Assistance Level: To/From;Bed;Standby Assist      GAIT:  Gait  Gait Distance: 100 feet  Gait: Assistance Level: Minimal Assist  Gait: Assistive Device: (IV pole)  Comments: flexed posture (appears baseline), Fatigues quickly patient reports he is fatiguing much faster than baseline    EDUCATION:  Education  Persons Educated: Patient  Patient Barriers To Learning: None Noted  Teaching Methods: Verbal Instruction  Patient Response: Verbalized Understanding  Topics: Plan/Goals of PT Interventions;Safety Awareness;Up with Assist Only;Importance of Increasing Activity    ASSESSMENT/PROGRESS:  Assessment/Progress  Impaired Mobility Due To: Decreased Activity Tolerance  Assessment/Progress: Should Improve w/ Continued PT  AM-PAC 6 Clicks Basic Mobility Inpatient  Turning from your back to your side while in a flat bed without using bed rails: A Little Moving from lying on your back to sitting on the side of a flatbed without using bedrails : A Little  Moving to and from a bed to a chair (including a wheelchair): A Little  Standing up from a chair using your arms (e.g. wheelchair, or bedside chair): A Little  To walk in hospital room: A Little  Climbing 3-5 steps with a railing: A Little  Raw Score: 18  Standardized (T-scale) Score: 41.05  Basic Mobility CMS 0-100%: 40.47  CMS G Code Modifier for Basic Mobility: CK    GOALS:  Goals  Goal Formulation: With Patient  Time For Goal Achievement: 5 days, To, 7 days  Pt Will Go Supine To/From Sit: w/ Stand By Assist  Pt Will Transfer Sit to Stand: w/ Stand By Assist  Pt Will Ambulate: Greater than 200 Feet, w/ Dan Humphreys, w/ Stand By Assist    PLAN:  Plan   Treatment Interventions: Mobility Training;Strengthening;Balance Activities  Plan Frequency: 3-5 Days per Week  PT Plan for Next Visit: Progress ambulation    RECOMMENDATIONS:  PT Discharge Recommendations  PT Discharge Recommendations: Home with Assistance  Equipment Recommendations: Patient owns necessary equipment    Therapist: Collins Scotland, PT  Date: 12/19/2016

## 2016-12-19 NOTE — Progress Notes
Neurosurgery Progress Note      Admission Date: 12/16/2016 LOS: 3 days  ______________________________________________________________    S: Patient doing well without complaints.  Plans to continue with activities today      O:                  Vital Signs: 24 Hour Range   BP: (97-124)/(50-69)   Temp:  [36.6 C (97.8 F)-37.1 C (98.8 F)]   Pulse:  [62-131]   Respirations:  [18 PER MINUTE]   SpO2:  [92 %-100 %]   O2 Delivery: None (Room Air)     Physical Exam:  Awake and alert; oriented x 3  AROM all extremities  Strength symmetric  Left externalized shunt intact with dressing, 213 ml output past 24h      A/P: 26 y.o. male s/p externalization of ventriculoperitoneal shunt  Active Problems:    Spina bifida (HCC)    Shunt malfunction    Hydrocephalus    HTN (hypertension)    Neurogenic bladder    Continue current care  IR unable to locate a pocket appropriate for drainage  Will reimage CT abdomen tomorrow  Monitor externalized shunt drainage, maintain ancef ppx  CSF Cx NGTD x2  Mobilize PT/OT  Pain control PRN  Continue PTA Keppra  Continue to follow cultures and once finalized negative then will discuss timing of internalization      Prophylaxis:   A) GI: PPI  B) Lines:  No  C) Urinary Catheter:  No  D) Antibiotic Usage:  Yes; Infection present or suspected:  Externalized shunt ppx  E) VTE:  Pharmacological prophylaxis; SQ Heparin and Mechanical prophylaxis; Sequential compression device  F) Restraints: Patient assessed for need for restraints.     Please call [phone] with any questions.    Evaristo Bury, MD       Attending Note    Patient and imaging reviewed with resident/ NP. Patient seen and examined on 12/18/16 .  I agree with the history, examination findings and assessment/ plan unless otherwise noted below.    likley Thursday for shunt revision  will obtain abdo CT    Storm Frisk, MD  Department of Neurosurgery

## 2016-12-20 ENCOUNTER — Encounter: Admit: 2016-12-20 | Discharge: 2016-12-20 | Payer: MEDICARE

## 2016-12-20 DIAGNOSIS — N200 Calculus of kidney: ICD-10-CM

## 2016-12-20 DIAGNOSIS — F988 Other specified behavioral and emotional disorders with onset usually occurring in childhood and adolescence: ICD-10-CM

## 2016-12-20 DIAGNOSIS — G4733 Obstructive sleep apnea (adult) (pediatric): ICD-10-CM

## 2016-12-20 DIAGNOSIS — R51 Headache: ICD-10-CM

## 2016-12-20 DIAGNOSIS — Q0700 Arnold-Chiari syndrome without spina bifida or hydrocephalus: ICD-10-CM

## 2016-12-20 DIAGNOSIS — N390 Urinary tract infection, site not specified: ICD-10-CM

## 2016-12-20 DIAGNOSIS — R569 Unspecified convulsions: ICD-10-CM

## 2016-12-20 DIAGNOSIS — T85618A Breakdown (mechanical) of other specified internal prosthetic devices, implants and grafts, initial encounter: ICD-10-CM

## 2016-12-20 DIAGNOSIS — Q059 Spina bifida, unspecified: Secondary | ICD-10-CM

## 2016-12-20 DIAGNOSIS — G919 Hydrocephalus, unspecified: ICD-10-CM

## 2016-12-20 DIAGNOSIS — N319 Neuromuscular dysfunction of bladder, unspecified: ICD-10-CM

## 2016-12-20 LAB — BASIC METABOLIC PANEL: Lab: 136 MMOL/L — ABNORMAL LOW (ref 137–147)

## 2016-12-20 LAB — CBC AND DIFF: Lab: 7.4 10*3/uL — ABNORMAL LOW (ref >60)

## 2016-12-20 MED ORDER — HEPARIN, PORCINE (PF) 5,000 UNIT/0.5 ML IJ SYRG
5000 [IU] | SUBCUTANEOUS | 0 refills | Status: CP
Start: 2016-12-20 — End: ?
  Administered 2016-12-20 – 2016-12-22 (×6): 5000 [IU] via SUBCUTANEOUS

## 2016-12-20 NOTE — Consults
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 male with h/o spina bifida, VP shunt, remote h/o cecostomy    PLAN:  - patient has remove h/o cecostomy, no longer draining  - CT scan 12/9 without intraabdominal fluid collection, Cx NGTD  - no obvious contraindication to VP shunt replacement  - will discuss timing of surgery    Patient discussed with staff surgeon, Dr. Joan Flores, who directed plan of care  __________________________________________________________________________________    HPI: Allen Gill is a 26 y.o. male h/o spina bifida s/p VP shunt. Pt developed symptoms of AMS, vision changes last weekend. Admitted to NSG, shunt externalized with resolution of symptoms. Patient reports remove history of cecostomy which no longer drains, has not been used in many years. Denies abd pain, N/V. Pt reports good bowel function.     Past Medical History:   Diagnosis Date   ??? ADD (attention deficit disorder)     without hyperactivity   ??? Arnold-Chiari malformation (HCC)    ??? Headache(784.0)    ??? Hydrocephalus    ??? Infection of VP shunt    ??? Kidney stones     frequent   ??? Myelomeningocele (HCC)    ??? Neurogenic bladder    ??? OSA (obstructive sleep apnea)    ??? 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 Shunt malfunction    ??? Spina bifida (HCC)    ??? Urinary tract infection     frequent     Past Surgical History:   Procedure Laterality Date   ??? HX ABDOMEN SURGERY  01/2001    Cecostomy ??? SHUNT REVISION  October 2010    [place]   ??? SHUNT REVISION  12/11/09    replacment of valve to acodman hakem adjustablevalve   ??? CATHETER IMPLANT/REVISION  12/27/09    distal end of the catheter was revised   ??? CATHETER IMPLANT/REVISION  01/31/10    replacement of ventricular catheter with [employer] framelessstereotaxis catheter   ??? VENTRICULOSTOMY  02/03/10    removal of all shunt components and placementofright frontal ventriculostomy   ??? SHUNT REVISION  02/17/10    left frontal ventriculopleural shunt   ??? HX ABDOMEN SURGERY      fundoplication   ??? HX BACK SURGERY      repair of spina bifida   ??? HX BRAIN SURGERY  5 months old    Chiari decompression   ??? HX EAR TUBES     ??? HX HERNIA REPAIR      inguinal hernia   ??? HX SURGERY  at 2 weeks    VP Shunt   ??? HX TONSILLECTOMY     ??? HX TRACHEOSTOMY     ??? SHUNT REVISION  3 months old   ??? SHUNT REVISION  26 years old     Medications:  No current facility-administered medications on file prior to encounter.      Current Outpatient Medications on File Prior  to Encounter   Medication Sig Dispense Refill   ??? acetaminophen (TYLENOL) 325 mg tablet Take two tablets by mouth four times daily.  0   ??? carvedilol (COREG) 12.5 mg tablet Take one tablet by mouth twice daily with meals. Take with food. 180 tablet 3   ??? imipramine (TOFRANIL) 25 mg tablet TAKE 2 TABLETS BY MOUTH IN THE MORNING AND 1 TABLET IN THE EVENING 270 tablet 0   ??? levETIRAcetam (KEPPRA) 500 mg tablet Take 1 tablet by mouth twice daily. 60 tablet 11   ??? lisinopril (PRINIVIL; ZESTRIL) 5 mg tablet Take one tablet by mouth daily. 90 tablet 3   ??? Miscellaneous Medical Supply misc Urostomy supplies and accessories  Dispense one month of supplies  Neurogenic bladder [N31.9] 10 Device 99   ??? MV,Ca,Min-Iron-FA-Lycopene (CENTRUM MEN) 8 mg iron- 200 mcg-600 mcg tab Take 1 Tab by mouth daily.     ??? spironolactone (ALDACTONE) 25 mg tablet Take 1 tablet by mouth daily. Take with food. 90 tablet 3 Allergies:  Latex; Amoxicillin; Ceclor [cefaclor]; Clindamycin; and Zosyn [piperacillin-tazobactam]    Social History     Socioeconomic History   ??? Marital status: Single     Spouse name: Not on file   ??? Number of children: Not on file   ??? Years of education: Not on file   ??? Highest education level: Not on file   Social Needs   ??? Financial resource strain: Not on file   ??? Food insecurity - worry: Not on file   ??? Food insecurity - inability: Not on file   ??? Transportation needs - medical: Not on file   ??? Transportation needs - non-medical: Not on file   Occupational History   ??? 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 Smoking status: Never Smoker   ??? Smokeless tobacco: Never Used   Substance and Sexual Activity   ??? Alcohol use: No   ??? Drug use: No   ??? Sexual activity: Not Currently     Partners: Female   Other Topics Concern   ??? Not on file   Social History Narrative   ??? Not on file     Family History   Problem Relation Age of Onset   ??? Diabetes Father    ??? Hypertension Father    ??? Other Father         glaucoma   ??? High Cholesterol Maternal Grandfather      Vitals:  Vital Signs: Last Filed In 24 Hours Vital Signs: 24 Hour Range   BP: 95/55 (12/10 1625)  Temp: 37.3 ???C (99.1 ???F) (12/10 1625)  Pulse: 107 (12/10 1625)  Respirations: 18 PER MINUTE (12/10 1625)  SpO2: 97 % (12/10 1625)  O2 Delivery: None (Room Air) (12/10 1625) BP: (95-118)/(51-68)   Temp:  [36.6 ???C (97.8 ???F)-37.3 ???C (99.2 ???F)]   Pulse:  [72-112]   Respirations:  [16 PER MINUTE-18 PER MINUTE]   SpO2:  [94 %-98 %]   O2 Delivery: None (Room Air)   Intensity Pain Scale (Self Report): 0 (12/20/16 1530)      Intake/Output:    Intake/Output Summary (Last 24 hours) at 12/20/2016 1656  Last data filed at 12/20/2016 1625  Gross per 24 hour   Intake 1060 ml   Output 2170 ml   Net -1110 ml     Physical Exam:   GEN: A&O. NAD   CV: Normal rate, regular rhythm   PULM: Non-labored, CTAB  ABD: Soft, non-distended, NTTP, ileal conduit in place with drainage in bag  NEURO: Grossly intact    ROS:  Patient denies fatigue, fever/chills, weight gain/loss, chest pain, shortness of breath, chest pressure, abdominal pain, nausea/vomiting, bloody stool, melena, constipation/diarrhea, problems urinating, swelling of extremities.      Lab/Radiology/Other Diagnostic Tests:  Recent Labs      12/18/16   0417  12/19/16   0413  12/20/16   0509   HGB  12.4*  11.7*  11.6*   HCT  38.0*  35.6*  35.3*   WBC  7.4  6.9  7.4   PLTCT  311  284  264   NA  137  137  136*   K  4.8  4.4  4.5   CL  104  102  103   CO2  26  26  29    BUN  21  21  18    CR  0.92  0.69  0.84   GLU  94  113*  108*   CA  9.6  9.3  9.0     Glucose: (!) 108 (12/20/16 0509)    Newman Pies, MD

## 2016-12-20 NOTE — Progress Notes
PHYSICAL THERAPY  PROGRESS NOTE       MOBILITY:  Mobility  Progressive Mobility Level: Walk in hallway  Distance Walked (feet): 75 ft  Level of Assistance: Assist X1  Assistive Device: Other (Comment)(IV pole)  Time Tolerated: 11-30 minutes  Activity Limited By: Weakness;Fatigue    SUBJECTIVE:  Subjective  Significant hospital events: 26 y.o. male s/p externalization of ventriculoperitoneal shunt. Past history of spina bifida, hernia repair, seizures, shunt infection  Mental / Cognitive Status: Alert;Oriented  Persons Present: RehabTechnician  Pain: Patient has no complaint of pain  Comments: Patient with external shunt, Charge RN reports this does not need adjusted for ambulation  Comments: (BLE AfOs)  Ambulation Assist: Independent Mobility in [employer] without Device  Patient Owned Equipment: Education officer, community [employer] Situation: Lives Alone  Type of Home: Apartment  Entry Stairs: No Stairs  Comments: Patient often discharges to his parents home after hospitalizations    BED MOBILITY/TRANSFERS:  Bed Mobility/Transfers  Transfer Type: Sit to/from Stand  Transfer: Assistance Level: To/From;Bed Side Chair;Standby Assist    GAIT:  Gait  Gait Distance: 75 feet  Gait: Assistance Level: Minimal Assist  Gait: Assistive Device: (IV pole)  Gait: Descriptors: Antalgic;Pace: Slow;No balance loss;Decreased step length;Variable step length  Comments: flexed posture (appears baseline), Fatigues quickly patient reports he is fatiguing much faster than baseline    EDUCATION:  Education  Persons Educated: Patient  Patient Barriers To Learning: None Noted  Teaching Methods: Verbal Instruction  Patient Response: Verbalized Understanding  Topics: Plan/Goals of PT Interventions;Safety Awareness;Up with Assist Only;Importance of Increasing Activity    ASSESSMENT/PROGRESS:  Assessment/Progress  Impaired Mobility Due To: Decreased Activity Tolerance  Assessment/Progress: Should Improve w/ Continued PT AM-PAC 6 Clicks Basic Mobility Inpatient  Turning from your back to your side while in a flat bed without using bed rails: A Little  Moving from lying on your back to sitting on the side of a flatbed without using bedrails : A Little  Moving to and from a bed to a chair (including a wheelchair): A Little  Standing up from a chair using your arms (e.g. wheelchair, or bedside chair): A Little  To walk in hospital room: A Little  Climbing 3-5 steps with a railing: A Little  Raw Score: 18  Standardized (T-scale) Score: 41.05  Basic Mobility CMS 0-100%: 40.47  CMS G Code Modifier for Basic Mobility: CK    GOALS:  Goals  Goal Formulation: With Patient/Family  Time For Goal Achievement: 5 days, To, 7 days  Pt Will Go Supine To/From Sit: w/ Stand By Assist  Pt Will Transfer Sit to Stand: w/ Stand By Assist  Pt Will Ambulate: Greater than 200 Feet, w/ Dan Humphreys, w/ Stand By Assist    PLAN:  Plan   Treatment Interventions: Mobility Training;Strengthening;Balance Activities  Plan Frequency: 3-5 Days per Week  PT Plan for Next Visit: Progress ambulation    RECOMMENDATIONS:  PT Discharge Recommendations  PT Discharge Recommendations: Home with Assistance  Equipment Recommendations: Patient owns necessary equipment    Therapist: Dalphine Handing, PT, DPT, [facility]  Date: 12/20/2016

## 2016-12-20 NOTE — Case Management (ED)
Case Management Admission Assessment    NAME:Allen Gill                          MRN: [id number]             DOB:07/04/1990          AGE: 26 y.o.  ADMISSION DATE: 12/16/2016             DAYS ADMITTED: LOS: 4 days      Today???s Date: 12/20/2016    Source of Information: Patient and EMR    Plan  Plan: Case Management Assessment, Assist PRN with SW/NCM Services, Discharge Planning for Home Anticipated  Patient is #4 Days Post-Op revision of cerebrospinal fluid shunt, externalization of VP shunt.   ??? Most recent therapy recommendations:  ??? PT: Home with Assistance  ??? OT: Home with family assist (likely, will re-assess post-operatively)  ??? CM needs are not fully known, but may include home health, outpatient therapy, and/or home infusion (cultures negative thus far).  ??? Neurosurgery CM team to continue to follow patient's plan of care via EMR and team huddle; will assist with discharge planning needs as indicated.    Assessment Notes  Allen Gill is sitting up in bed at time of NCM assessment; agreeable to completing assessment at this time.   ??? NCM met with patient and provided contact information and explanation of CM roles. He was encouraged to contact case management with questions and concerns during hospitalization.   ??? Patient lives independently in an apartment home, but plans to stay with his parents until he is cleared to drive again. He stays on the main level when at his parents, and there are no entry steps.  ??? Home support is assessed to be consistent, 24/7 as needed (patient's father is retired).  ??? Patient's previous HH, LTACH, SNF, IPR, DME, outpatient therapy experience includes:  ??? HH [facility] - would use again)  ??? Home infusion ([facility] - would use again)  ??? SNF (unsure, but may have been [facility])  ??? DME (RW, quad cane, leg braces)     Patient Address/Phone  [address]  [address]  [place] [address]  [phone] (home)     Emergency Contact  Extended Emergency Contact Information Primary Emergency Contact: AllenAnn  Address: [place], [place] [address] [place]  Home Phone: [phone]  Mobile Phone: [phone]  Relation: Mother  Secondary Emergency Contact: Gill,Allen  Address: [address]           [place], [place] [address] Allen Gill  Home Phone: [phone]  Mobile Phone: [phone]  Relation: Father    Healthcare Directive  Healthcare Directive: Yes, patient has a healthcare directive  Type of Healthcare Directive: Healthcare directive  Location of Healthcare Directive: Patient does not have it with him/her  Would patient like to fill out a (a new) Editor, commissioning?: N/A  Psych Advance Directive (Psych unit only): Other (Comment)    Transportation  Does the patient need discharge transport arranged?: No  Transportation Name, Phone and Availability #1: Ratana Flood (mother), ph [phone]  Does the patient use Medicaid Transportation?: Yes(Very rarely)    Expected Discharge Date  Expected Discharge Date: 12/22/16  ??? Expected discharge date pending patient's medical stability; medical milestones: Surgery to re-internalize shunt, recovery, and DC planning.     Living Situation Prior to Admission  ?  Living Arrangements  Type of Residence: Home, independent  Living Arrangements: Alone  Financial risk analyst / Tub: Tub/Shower Unit  How many levels in the residence?: 1  Can patient live on one level if needed?: Yes  Does residence have entry and/or side stairs?: No  Assistance needed prior to admit or anticipated on discharge: Yes(Anticipated)  Who provides assistance or could if needed?: Patient plans to stay at his parents' home until he is cleared to drive again - his father is retired and parents can provide 24/7 assist if needed  Are they in good health?: Unknown  Can support system provide 24/7 care if needed?: Yes  ? Level of Function   Prior level of function: Independent   ? Cognitive Abilities Cognitive Abilities: Alert and Oriented, Engages in problem solving and planning, Participates in decision making, Recognizes impact of health condition on lifestyle    Financial Resources  ? Coverage  Primary Insurance: Medicare  Secondary Insurance: Medicaid  Additional Coverage: RX(Denies copays for meds/healthcare. Home Rx is [facility] at [address].)      [id number] - [place], [place] - [address] AT [address]  Phone: [phone] Fax: [phone]    Payor: MEDICARE / Plan: MEDICARE PART A AND B / Product Type: Medicare /   ? Source of Income   Source Of Income: Employed(States will need release to work after f/u visits)  ? Financial Assistance Needed?  Denies need for financial assistance including Rx coverage    Psychosocial Needs  ? Mental Health  Mental Health History: No  ? Substance Use History  Patient denies substance use issues.    Current/Previous Services  ? PCP  Annice Pih., [phone], [phone]   Patient is current with PCP; has been seen within the past month.  ? Pharmacy    [id number] - [place], [place] - [address] AT [facility] [address]  [address]  [place] [address]  Phone: [phone] Fax: [phone]    [id number] - [place], [place] - [address] AT [facility] OF [address]  [address]  [place] [address]  Phone: [phone] Fax: [phone]    ? Durable Medical Equipment   Durable Medical Equipment at home: [employer], Quad Cane(leg braces)  ? Home Health  Receiving home health: In the past  Agency name: [facility]  Would patient use this agency again?: Yes  ? Hemodialysis or Peritoneal Dialysis  Undergoing hemodialysis or peritoneal dialysis: No  ? Tube/Enteral Feeds  Receive tube/enteral feeds: No  ? Infusion  Receive infusions: In the past  Where: Home  Infusion company: [facility]  Would patient use this agency again?: Yes  ? Private Duty  Private duty help used: No  ? Home and Community Based [employer] and community based services: No  ? Zackery Bare[facility] [place]: N/A  ? Hospice  Hospice: No  ? Outpatient Therapy  PT: In the past  When did patient receive care?: 2014 or 2015  Name of rehab location/group: Can't recall name, but was admitted there first for SNF (possibly [facility])  Would patient return for future services?: Yes  OT: In the past  When did patient receive care?: same as above  SLP: No  ? Skilled Nursing Facility/Nursing Home  SNF: In the past  When did patient receive care?: 2014 or 2015  Name of Facility: Can't recall, but located [address] (may have been [facility])  NH: No  ? Inpatient Rehab  IPR: No  ? Long-Term Acute Care Hospital  LTACH: No  ? Acute Hospital Stay  Acute Hospital Stay: In the past  Was patient's stay within the last 30 days?: No  When did patient receive care?: 10/2016  Name of hospital: [facility]  Related or Unrelated?: Related    Williemae Area BSN, RN  Neurosurgery Nurse Case Manager  [phone]

## 2016-12-20 NOTE — Consults
Infectious Diseases Initial Consult    Today's Date:  12/20/2016  Admission Date: 12/16/2016    Reason for this consultation: I was asked to provide my opinion recommendations regarding this 26 year old man who was admitted to the hospital for ventriculoperitoneal shunt malfunction blurry vision and headache.  He was found to have a fluid collection in the abdomen.  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 Reactive mesenteric lymph nodes are noted.   Shunt tubing can be identified in the anterior subcutaneous fat of the   upper abdomen.  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  -S/P R sided EVD removal 5/12, GS from ventriculat CSF still has GPC 05/23/2015  -all old shunts were removed  -S/P new shunt on left side, VP 05/2015  ???  H/OHealthcare associated pneumonia likely from aspiration CT scan of the chest on admission 5 08/2015 showed empyema and pulmonary infiltrates, he was intubated and extubated on 05/21/2015.  Bronchoalveolar lavage grew flora. ???  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  ???  H/o Nephrolithiasis  S/p Right percutaneous nephrolithotomy >2cm, antegrade stent placement. 03/26-29/14.??????  ???S/P removal of renal stones 04/20/12 ???   ???  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  His symptoms started very acutely and his fluid analysis is benign.  So unlikely that this is an infection.  The lab For culture only for 3days so it was not held for the usual cultures like 5 days.  So unable to do anything on those as they were thrown away.    But I think there is a good evidence that the patient has no infection I think we can proceed with revision of his shunt as planned.    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  This is not active and I do not think he is causing him any problem and he does not need any attention at this time.  History of Present Illness    Allen Gill is a 26 y.o.      Orestes Laquincy Eastridge is a 26 y.o. man with the past with history of [address] Chiari malformation and hydrocephalus status post VP shunt infection in the past with multiple revisions.     He had the right-sided revised for malfunction this was on 04/25/2015.  He states that after he left home he had some mild to moderate pain at the site of the insertion of the right chest.  This was worse with movement and cough.  This continued until about May 13, 2015 when he started having pain on the left side of the chest.  And around the same time he started having more cough cough and some yellowish sputum and shortness of breath he could only walk 100 feet or so. He became very short of breath and his family brought him to the emergency department.   He underwent removal of the left-sided ventriculopleural shunt as there was evidence of infection the CT scan of the chest showed empyema on the left side and pneumonia.    At that time he had chest tube placed and his empyema was treated all his shunts were removed and he had a new shunt placed on the left side after treating his infection.    Most recently he had a new ileal conduit surgery by urology and his old bladder was removed this was on 10/13/2016.    Patient states that he was doing fine until 12 5018 when he had some mild headache.  It was not bad he does not have any fever the day before he was doing completely normal.    On 12 6018 he woke up with the blurry vision and headache.  His speech it was also noted to be slurry.  So his family brought him to the hospital.    He denies any fever chills or cough prior to this happening.  He was admitted and he underwent externalization of the shunt at the clavicular level on the left side and removal of the distal part.  The fluid analysis was normal.  Patient improved his headache has resolved and is back to usual now.  He does not have any blurry vision today.    Cultures were negative at 3 days from the CSF    His white count was normal on admission and CSF analysis was benign with only 2 white cells and normal glucose and protein    He was placed on cefazolin for prophylaxis with his shunt    Antimicrobial Start date End date                                      Estimated Creatinine Clearance: 136.3 mL/min (based on SCr of 0.84 mg/dL).    Past Medical History     Past Medical History:   Diagnosis Date   ??? ADD (attention deficit disorder)     without hyperactivity   ??? Arnold-Chiari malformation (HCC)    ??? Headache(784.0)    ??? Hydrocephalus ??? Infection of VP shunt    ??? Kidney stones     frequent   ??? Myelomeningocele (HCC)    ??? Neurogenic bladder    ??? OSA (obstructive sleep apnea)    ??? 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 Shunt malfunction    ???  Spina bifida (HCC)    ??? Urinary tract infection     frequent       Past Surgical History     Past Surgical History:   Procedure Laterality Date   ??? HX ABDOMEN SURGERY  01/2001    Cecostomy   ??? SHUNT REVISION  October 2010    [place]   ??? SHUNT REVISION  12/11/09    replacment of valve to acodman hakem adjustablevalve   ??? CATHETER IMPLANT/REVISION  12/27/09    distal end of the catheter was revised   ??? CATHETER IMPLANT/REVISION  01/31/10    replacement of ventricular catheter with [employer] framelessstereotaxis catheter   ??? VENTRICULOSTOMY  02/03/10    removal of all shunt components and placementofright frontal ventriculostomy   ??? SHUNT REVISION  02/17/10    left frontal ventriculopleural shunt   ??? HX ABDOMEN SURGERY      fundoplication   ??? HX BACK SURGERY      repair of spina bifida   ??? HX BRAIN SURGERY  5 months old    Chiari decompression   ??? HX EAR TUBES     ??? HX HERNIA REPAIR      inguinal hernia   ??? HX SURGERY  at 2 weeks    VP Shunt   ??? HX TONSILLECTOMY     ??? HX TRACHEOSTOMY     ??? SHUNT REVISION  3 months old   ??? SHUNT REVISION  26 years old       Social History   :  Social History     Tobacco Use   ??? Smoking status: Never Smoker   ??? Smokeless tobacco: Never Used   Substance Use Topics   ??? Alcohol use: No       Family History     Family History   Problem Relation Age of Onset   ??? Diabetes Father    ??? Hypertension Father    ??? Other Father         glaucoma   ??? High Cholesterol Maternal Grandfather        Allergies     Allergies   Allergen Reactions   ??? Latex RASH and SHORTNESS OF BREATH   ??? Amoxicillin RASH and STOMACH UPSET   ??? Ceclor [Cefaclor] HIVES     Pt has tolerated ancef, keflex, and cefepime   ??? Clindamycin HIVES   ??? Zosyn [Piperacillin-Tazobactam] HIVES       Review of Systems A comprehensive 14-point review of systems was negative with exception of:  Headache  Blurry vision  Slurred speech  No fever chills or sweats  Weakness lower extremities  He has ileal conduit and neurogenic bladder  Some numbness of the legs  No cough shortness of breath or chest pain  No sore throat  No recent weight loss    Medications   Scheduled Meds:  carvedilol (COREG) tablet 12.5 mg 12.5 mg Oral BID   ceFAZolin (ANCEF) IVP 2 g 2 g Intravenous Q8H*   docusate (COLACE) capsule 100 mg 100 mg Oral BID   heparin (porcine) PF syringe 5,000 Units 5,000 Units Subcutaneous Q8H   imipramine (TOFRANIL) tablet 25 mg 25 mg Oral QHS   imipramine (TOFRANIL) tablet 50 mg 50 mg Oral QDAY   levETIRAcetam (KEPPRA) tablet 500 mg 500 mg Oral BID   lisinopril (PRINIVIL; ZESTRIL) tablet 5 mg 5 mg Oral QDAY   milk of magnesia (CONC) oral suspension 10 mL 10 mL Oral QDAY   senna/docusate (SENOKOT-S) tablet  1 tablet 1 tablet Oral BID   spironolactone (ALDACTONE) tablet 25 mg 25 mg Oral QDAY   Continuous Infusions:  PRN and Respiratory Meds:calcium carbonate BID PRN, methocarbamol TID PRN, ondansetron (ZOFRAN) IV Q6H PRN, oxyCODONE Q4H PRN      Physical Examination                          Vital Signs: Last                  Vital Signs: 24 Hour Range   BP: 106/68 (12/10 1138)  Temp: 37.3 ???C (99.2 ???F) (12/10 1138)  Pulse: 91 (12/10 1138)  Respirations: 18 PER MINUTE (12/10 1138)  SpO2: 96 % (12/10 1138)  O2 Delivery: None (Room Air) (12/10 1138) BP: (100-124)/(51-68)   Temp:  [36.6 ???C (97.8 ???F)-37.3 ???C (99.2 ???F)]   Pulse:  [72-107]   Respirations:  [16 PER MINUTE-18 PER MINUTE]   SpO2:  [94 %-98 %]   O2 Delivery: None (Room Air)     General appearance: alert, oriented, NAD  HENT: mucus membranes moist, no oral lesions/thrush  Eyes: PERRL, EOM grossly intact, Conj nl  Neck: supple, no lymphadenopathy, the ventricular shunt is externalized at the left clavicular site the site looks okay the CSF fluid looks clear Lungs: no wheezing, rhonchi, rales appreciated  Heart: Regular rhythm, reg rate, with no murmur, rub, gallop  Abdomen: soft, slightly distended he has urostomy with ileal conduit the urine looks okay.  Abdomen is nontender there is no sinus tract to the skin there is scars from old surgery  Ext: Mild edema of the legs  Skin: no rashes/lesions  Lymph: no cervical, axillary adenopathy    Lines: Peripheral line the site is okay    Lab Review   Hematology  Recent Labs      12/18/16   0417  12/19/16   0413  12/20/16   0509   WBC  7.4  6.9  7.4   HGB  12.4*  11.7*  11.6*   HCT  38.0*  35.6*  35.3*   PLTCT  311  284  264     Chemistry  Recent Labs      12/18/16   0417  12/19/16   0413  12/20/16   0509   NA  137  137  136*   K  4.8  4.4  4.5   CL  104  102  103   CO2  26  26  29    BUN  21  21  18    CR  0.92  0.69  0.84   GFR  >60  >60  >60   GLU  94  113*  108*   CA  9.6  9.3  9.0       Microbiology, Radiology and other Diagnostics Review   Microbiology data reviewed.    Pertinent radiology images viewed.  Impression:      Pasty Spillers, MD   Pager [phone]  Infectious Diseases Faculty

## 2016-12-21 ENCOUNTER — Encounter: Admit: 2016-12-21 | Discharge: 2016-12-21 | Payer: MEDICARE

## 2016-12-21 ENCOUNTER — Inpatient Hospital Stay: Admit: 2016-12-21 | Discharge: 2016-12-21 | Payer: MEDICARE

## 2016-12-21 DIAGNOSIS — T8501XA Breakdown (mechanical) of ventricular intracranial (communicating) shunt, initial encounter: Principal | ICD-10-CM

## 2016-12-21 LAB — BASIC METABOLIC PANEL: Lab: 137 MMOL/L — ABNORMAL LOW (ref 137–147)

## 2016-12-21 LAB — CBC AND DIFF: Lab: 7.2 K/UL — ABNORMAL LOW (ref 4.5–11.0)

## 2016-12-21 NOTE — Progress Notes
OCCUPATIONAL THERAPY  NO TREATMENT NOTE     The patient was not seen due to: Pt in chair upon OT arrival.  Pt reporting he does not feel like he has any OT needs at this time and reports there is a possible plan for OR on 12/13.  OT to monitor and follow up post op for an evaluation.        Therapist: Maxine Glenn , OTR/L   Date: 12/21/2016

## 2016-12-21 NOTE — Progress Notes
PHYSICAL THERAPY  MOBILITY NOTE   Patient was mobilized today with the assistance of the P.T. mobility aide as part of the ongoing physical therapy plan of care.      Mobility  Progressive Mobility Level: Walk in hallway  Distance Walked (feet): 75 ft  Level of Assistance: Assist X1  Assistive Device: None  Time Tolerated: 0-10 minutes  Activity Limited By: Weakness;Fatigue    Aide: Karma Lew  Date: 12/21/2016

## 2016-12-22 ENCOUNTER — Encounter: Admit: 2016-12-22 | Discharge: 2016-12-22 | Payer: MEDICARE

## 2016-12-22 ENCOUNTER — Inpatient Hospital Stay: Admit: 2016-12-22 | Discharge: 2016-12-22 | Payer: MEDICARE

## 2016-12-22 DIAGNOSIS — T85618A Breakdown (mechanical) of other specified internal prosthetic devices, implants and grafts, initial encounter: ICD-10-CM

## 2016-12-22 DIAGNOSIS — N319 Neuromuscular dysfunction of bladder, unspecified: ICD-10-CM

## 2016-12-22 DIAGNOSIS — Q0700 Arnold-Chiari syndrome without spina bifida or hydrocephalus: ICD-10-CM

## 2016-12-22 DIAGNOSIS — N200 Calculus of kidney: ICD-10-CM

## 2016-12-22 DIAGNOSIS — G919 Hydrocephalus, unspecified: ICD-10-CM

## 2016-12-22 DIAGNOSIS — T8501XA Breakdown (mechanical) of ventricular intracranial (communicating) shunt, initial encounter: Principal | ICD-10-CM

## 2016-12-22 DIAGNOSIS — R569 Unspecified convulsions: ICD-10-CM

## 2016-12-22 DIAGNOSIS — R51 Headache: ICD-10-CM

## 2016-12-22 DIAGNOSIS — F988 Other specified behavioral and emotional disorders with onset usually occurring in childhood and adolescence: ICD-10-CM

## 2016-12-22 DIAGNOSIS — G4733 Obstructive sleep apnea (adult) (pediatric): ICD-10-CM

## 2016-12-22 DIAGNOSIS — N390 Urinary tract infection, site not specified: ICD-10-CM

## 2016-12-22 DIAGNOSIS — Q059 Spina bifida, unspecified: Secondary | ICD-10-CM

## 2016-12-22 LAB — BASIC METABOLIC PANEL: Lab: 136 MMOL/L — ABNORMAL LOW (ref >60)

## 2016-12-22 LAB — CBC AND DIFF
Lab: 11 g/dL — ABNORMAL LOW (ref >60)
Lab: 5.8 K/UL — ABNORMAL LOW (ref 4.5–11.0)

## 2016-12-22 MED ORDER — MORPHINE 2 MG/ML IV CRTG
1 mg | INTRAVENOUS | 0 refills | Status: DC | PRN
Start: 2016-12-22 — End: 2016-12-22

## 2016-12-22 MED ORDER — SUGAMMADEX 100 MG/ML IV SOLN
INTRAVENOUS | 0 refills | Status: DC
Start: 2016-12-22 — End: 2016-12-22
  Administered 2016-12-22: 21:00:00 200 mg via INTRAVENOUS

## 2016-12-22 MED ORDER — FENTANYL CITRATE (PF) 50 MCG/ML IJ SOLN
50 ug | INTRAVENOUS | 0 refills | Status: DC | PRN
Start: 2016-12-22 — End: 2016-12-22
  Administered 2016-12-22: 22:00:00 50 ug via INTRAVENOUS

## 2016-12-22 MED ORDER — HALOPERIDOL LACTATE 5 MG/ML IJ SOLN
1 mg | Freq: Once | INTRAVENOUS | 0 refills | Status: DC | PRN
Start: 2016-12-22 — End: 2016-12-22

## 2016-12-22 MED ORDER — DIPHENHYDRAMINE HCL 25 MG PO CAP
25 mg | ORAL | 0 refills | Status: DC | PRN
Start: 2016-12-22 — End: 2016-12-24
  Administered 2016-12-22 (×2): 25 mg via ORAL

## 2016-12-22 MED ORDER — LIDOCAINE (PF) 10 MG/ML (1 %) IJ SOLN
.1-2 mL | INTRAMUSCULAR | 0 refills | Status: DC | PRN
Start: 2016-12-22 — End: 2016-12-22

## 2016-12-22 MED ORDER — METOCLOPRAMIDE HCL 5 MG/ML IJ SOLN
10 mg | Freq: Once | INTRAVENOUS | 0 refills | Status: DC | PRN
Start: 2016-12-22 — End: 2016-12-22

## 2016-12-22 MED ORDER — BACITRACIN ZINC 500 UNIT/GRAM TP OINT
0 refills | Status: DC
Start: 2016-12-22 — End: 2016-12-22
  Administered 2016-12-22: 19:00:00 1 via TOPICAL

## 2016-12-22 MED ORDER — PROPOFOL INJ 10 MG/ML IV VIAL
0 refills | Status: DC
Start: 2016-12-22 — End: 2016-12-22
  Administered 2016-12-22: 19:00:00 100 mg via INTRAVENOUS
  Administered 2016-12-22: 20:00:00 20 mg via INTRAVENOUS

## 2016-12-22 MED ORDER — SODIUM CHLORIDE 0.9 % IV SOLP
1000 mL | INTRAVENOUS | 0 refills | Status: DC
Start: 2016-12-22 — End: 2016-12-23

## 2016-12-22 MED ORDER — LIDOCAINE-EPINEPHRINE 1 %-1:100,000 IJ SOLN
0 refills | Status: DC
Start: 2016-12-22 — End: 2016-12-22
  Administered 2016-12-22: 20:00:00 2 mL via INTRAMUSCULAR

## 2016-12-22 MED ORDER — MEPERIDINE (PF) 25 MG/ML IJ SYRG
12.5 mg | INTRAVENOUS | 0 refills | Status: DC | PRN
Start: 2016-12-22 — End: 2016-12-22

## 2016-12-22 MED ORDER — DEXTRAN 70-HYPROMELLOSE (PF) 0.1-0.3 % OP DPET
0 refills | Status: DC
Start: 2016-12-22 — End: 2016-12-22
  Administered 2016-12-22: 19:00:00 2 [drp] via OPHTHALMIC

## 2016-12-22 MED ORDER — PHENYLEPHRINE IN 0.9% NACL(PF) 1 MG/10 ML (100 MCG/ML) IV SYRG
INTRAVENOUS | 0 refills | Status: DC
Start: 2016-12-22 — End: 2016-12-22
  Administered 2016-12-22 (×3): 100 ug via INTRAVENOUS

## 2016-12-22 MED ORDER — FENTANYL CITRATE (PF) 50 MCG/ML IJ SOLN
0 refills | Status: DC
Start: 2016-12-22 — End: 2016-12-22
  Administered 2016-12-22: 19:00:00 100 ug via INTRAVENOUS
  Administered 2016-12-22 (×2): 50 ug via INTRAVENOUS

## 2016-12-22 MED ORDER — ONDANSETRON HCL (PF) 4 MG/2 ML IJ SOLN
INTRAVENOUS | 0 refills | Status: DC
Start: 2016-12-22 — End: 2016-12-22
  Administered 2016-12-22: 20:00:00 4 mg via INTRAVENOUS

## 2016-12-22 MED ORDER — HYDROMORPHONE (PF) 2 MG/ML IJ SYRG
1 mg | INTRAVENOUS | 0 refills | Status: DC | PRN
Start: 2016-12-22 — End: 2016-12-22
  Administered 2016-12-22 (×2): 0.5 mg via INTRAVENOUS
  Administered 2016-12-22: 22:00:00 1 mg via INTRAVENOUS

## 2016-12-22 MED ORDER — PROMETHAZINE 25 MG/ML IJ SOLN
6.25 mg | INTRAVENOUS | 0 refills | Status: DC | PRN
Start: 2016-12-22 — End: 2016-12-22

## 2016-12-22 MED ORDER — ROCURONIUM 10 MG/ML IV SOLN
INTRAVENOUS | 0 refills | Status: DC
Start: 2016-12-22 — End: 2016-12-22
  Administered 2016-12-22: 19:00:00 40 mg via INTRAVENOUS
  Administered 2016-12-22: 20:00:00 20 mg via INTRAVENOUS

## 2016-12-22 MED ORDER — BACITRACIN 50,000 UN LR 500 ML IRR BOT (OR)
0 refills | Status: DC
Start: 2016-12-22 — End: 2016-12-22
  Administered 2016-12-22 (×2): 500 mL

## 2016-12-22 MED ORDER — LACTATED RINGERS IV SOLP
0 refills | Status: DC
Start: 2016-12-22 — End: 2016-12-22
  Administered 2016-12-22: 20:00:00 via INTRAVENOUS

## 2016-12-22 MED ORDER — OXYCODONE 5 MG PO TAB
5-10 mg | Freq: Once | ORAL | 0 refills | Status: DC | PRN
Start: 2016-12-22 — End: 2016-12-22

## 2016-12-22 MED ORDER — PHENYLEPHRINE IV DRIP (STD CONC)
0 refills | Status: DC
Start: 2016-12-22 — End: 2016-12-22
  Administered 2016-12-22 (×2): .3 ug/kg/min via INTRAVENOUS

## 2016-12-22 MED ORDER — DEXAMETHASONE SODIUM PHOSPHATE 4 MG/ML IJ SOLN
INTRAVENOUS | 0 refills | Status: DC
Start: 2016-12-22 — End: 2016-12-22
  Administered 2016-12-22: 20:00:00 4 mg via INTRAVENOUS

## 2016-12-22 MED ORDER — LIDOCAINE (PF) 200 MG/10 ML (2 %) IJ SYRG
0 refills | Status: DC
Start: 2016-12-22 — End: 2016-12-22
  Administered 2016-12-22: 19:00:00 100 mg via INTRAVENOUS

## 2016-12-22 MED ADMIN — SODIUM CHLORIDE 0.9 % IV SOLP [27838]: 1000 mL | INTRAVENOUS | @ 17:00:00 | Stop: 2016-12-22 | NDC 00338004904

## 2016-12-22 NOTE — Progress Notes
Received report from [place] RN at this time. Awaiting pt arrival.

## 2016-12-22 NOTE — Progress Notes
Patient arrived on unit via bed accompanied by RN. Assessment completed, refer to flowsheet for details. Orders released, reviewed, and implemented as appropriate. Oriented to surroundings, call light within reach. Plan of care reviewed.  Will continue to monitor and assess.

## 2016-12-22 NOTE — Progress Notes
PHYSICAL THERAPY  NOTE         Patient was unavailable for physical therapy secondary to OR today.  Physical therapy will continue to follow and provide intervention as indicated.        Therapist: Dalphine Handing, PT, DPT, MHAM  Date: 12/22/2016

## 2016-12-22 NOTE — Anesthesia Post-Procedure Evaluation
Post-Anesthesia Evaluation    Name: Allen Gill      MRN: [id number]     DOB: [birth year]     Age: 26 y.o.     Sex: male   __________________________________________________________________________     Procedure Date: 12/22/2016  Procedure: Procedure(s):  CREATION SHUNT - VENTRICULO-PERITONEAL: Left side. 2 hours, proximal catheter and valve in place. will need distal catheter placed, Dr. Joan Flores to complete. Supine      Surgeon: Surgeon(s):  Samantha Crimes, MD  Angelia Mould, MD  Simonne Martinet, MD    Post-Anesthesia Vitals  BP: 122/81 (12/12 1615)  Pulse: 106 (12/12 1615)  Respirations: 19 PER MINUTE (12/12 1615)  SpO2: 97 % (12/12 1615)  O2 Delivery: Nasal Cannula (12/12 1615)  SpO2 Pulse: 105 (12/12 1615)      Post Anesthesia Evaluation Note    Evaluation location: Pre/Post  Patient participation: recovered; patient participated in evaluation  Level of consciousness: alert    Pain score: 2  Pain management: adequate    Hydration: normovolemia  Airway patency: adequate    Perioperative Events  Perioperative events:  no       Post-op nausea and vomiting: no PONV    Postoperative Status  Cardiovascular status: hemodynamically stable  Respiratory status: spontaneous ventilation        Perioperative Events  Perioperative Event: No  Emergency Case Activation: No

## 2016-12-22 NOTE — Progress Notes
Pt c/o of itching on back and neck. RN text paged team. Will continue to monitor.

## 2016-12-22 NOTE — H&P (View-Only)
History and Physical Update Note    Allergies:  Latex; Amoxicillin; Ceclor [cefaclor]; Clindamycin; and Zosyn [piperacillin-tazobactam]    Lab/Radiology/Other Diagnostic Tests:  24-hour labs:    Results for orders placed or performed during the hospital encounter of 12/16/16 (from the past 24 hour(s))   TYPE & CROSSMATCH    Collection Time: 12/21/16  3:20 PM   Result Value Ref Range    Units Ordered 3     Crossmatch Expires 12/24/2016     Record Check FOUND     ABO/RH(D) O POS     Antibody Screen NEG     Electronic Crossmatch YES    BASIC METABOLIC PANEL    Collection Time: 12/22/16  4:30 AM   Result Value Ref Range    Sodium 136 (L) 137 - 147 MMOL/L    Potassium 4.6 3.5 - 5.1 MMOL/L    Chloride 102 98 - 110 MMOL/L    CO2 29 21 - 30 MMOL/L    Anion Gap 5 3 - 12    Glucose 108 (H) 70 - 100 MG/DL    Blood Urea Nitrogen 16 7 - 25 MG/DL    Creatinine 6.57 0.4 - 1.24 MG/DL    Calcium 9.1 8.5 - 84.6 MG/DL    eGFR Non African American >60 >60 mL/min    eGFR African American >60 >60 mL/min   CBC AND DIFF    Collection Time: 12/22/16  4:30 AM   Result Value Ref Range    White Blood Cells 5.8 4.5 - 11.0 K/UL    RBC 4.30 (L) 4.4 - 5.5 M/UL    Hemoglobin 11.7 (L) 13.5 - 16.5 GM/DL    Hematocrit 96.2 (L) 40 - 50 %    MCV 83.2 80 - 100 FL    MCH 27.3 26 - 34 PG    MCHC 32.8 32.0 - 36.0 G/DL    RDW 95.2 (H) 11 - 15 %    Platelet Count 283 150 - 400 K/UL    MPV 9.0 7 - 11 FL    Neutrophils 49 41 - 77 %    Lymphocytes 41 24 - 44 %    Monocytes 6 4 - 12 %    Eosinophils 4 0 - 5 %    Basophils 0 0 - 2 %    Absolute Neutrophil Count 2.80 1.8 - 7.0 K/UL    Absolute Lymph Count 2.40 1.0 - 4.8 K/UL    Absolute Monocyte Count 0.40 0 - 0.80 K/UL    Absolute Eosinophil Count 0.20 0 - 0.45 K/UL    Absolute Basophil Count 0.00 0 - 0.20 K/UL     Point of Care Testing:  (Last 24 hours):  Glucose: (!) 108 (12/22/16 0430)      I have examined the patient, and there are no significant changes in their condition, from the previous H&P performed on 12/16/2016.    Gaylyn Cheers, APRN  Pager [phone]  --------------------------------------------------------------------------------------------------------------------------------------------      ??? Arnold-Chiari malformation (HCC) ???   ??? Headache(784.0) ???   ??? Hydrocephalus ???   ??? Infection of VP shunt ???   ??? Kidney stones ???   ??? frequent   ??? Myelomeningocele (HCC) ???   ??? Neurogenic bladder ???   ??? OSA (obstructive sleep apnea) ???   ??? Seizures (HCC) ???   ??? blank staring spells in childhood   ??? Shunt malfunction ???   ??? Spina bifida (HCC) ???   ??? Urinary tract infection ???   ???  frequent   ???  ???  Past Surgical History:        Past Surgical History:   Procedure Laterality Date   ??? HX ABDOMEN SURGERY ??? 01/2001   ??? Cecostomy   ??? SHUNT REVISION ??? October 2010   ??? [place]   ??? SHUNT REVISION ??? 12/11/09   ??? replacment of valve to acodman hakem adjustablevalve   ??? CATHETER IMPLANT/REVISION ??? 12/27/09   ??? distal end of the catheter was revised   ??? CATHETER IMPLANT/REVISION ??? 01/31/10   ??? replacement of ventricular catheter with [employer] framelessstereotaxis catheter   ??? VENTRICULOSTOMY ??? 02/03/10   ??? removal of all shunt components and placementofright frontal ventriculostomy   ??? SHUNT REVISION ??? 02/17/10   ??? left frontal ventriculopleural shunt   ??? HX ABDOMEN SURGERY ??? ???   ??? fundoplication   ??? HX BACK SURGERY ??? ???   ??? repair of spina bifida   ??? HX BRAIN SURGERY ??? 5 months old   ??? Chiari decompression   ??? HX EAR TUBES ??? ???   ??? HX HERNIA REPAIR ??? ???   ??? inguinal hernia   ??? HX SURGERY ??? at 2 weeks   ??? VP Shunt   ??? HX TONSILLECTOMY ??? ???   ??? HX TRACHEOSTOMY ??? ???   ??? SHUNT REVISION ??? 3 months old   ??? SHUNT REVISION ??? 26 years old   ???  ???  Social History:  Social History   ???       Tobacco Use   ??? Smoking status: Never Smoker   ??? Smokeless tobacco: Never Used   Substance Use Topics   ??? Alcohol use: No   ??? Drug use: No   ???  ???  Family History:        Family History   Problem Relation Age of Onset ??? Diabetes Father ???   ??? Hypertension Father ???   ??? Other Father ???   ???     glaucoma   ??? High Cholesterol Maternal Grandfather ???   ???  ???  Allergies:    Latex; Amoxicillin; Ceclor [cefaclor]; Clindamycin; and Zosyn [piperacillin-tazobactam]  ???  Medications:  PTA:  ???  (Not in a hospital admission)  ???  Inpatient:  Scheduled Meds: Continuous Infusions:  ??? sodium chloride 0.9 %   infusion     ??? SODIUM CHLORIDE 0.9 % IV SOLP (Cabinet Override)     ???  PRN and Respiratory Meds:  ???  ???  Review of Systems:  Full 10 point review of systems negative except for HPI  ???  Physical Exam:  Vital Signs:  Last Filed in 24 hours Vital Signs:  24 hour Range    BP: 124/61 (12/06 1027)  Pulse: 75 (12/06 1027)  Respirations: 13 PER MINUTE (12/06 1027)  SpO2: 99 % (12/06 1027)  O2 Delivery: None (Room Air) (12/06 1610)  SpO2 Pulse: 75 (12/06 1027) BP: (124-138)/(61-73)   Pulse:  [75-86]   Respirations:  [13 PER MINUTE-15 PER MINUTE]   SpO2:  [97 %-99 %]   O2 Delivery: None (Room Air)   ???  General appearance: No acute distress  Lungs: nonlabored breathing  Heart: warm and well perfused.   Gastrointestinal: Ureteral collection bad, abdominal incision well healed.  Musculoskeletal: No edema, redness or tenderness in the calves or thighs  Skin: Integument intact without major lesion.  Psychiatric: Normal affect  ???  Neurologic Exam:   For his neurological exam the patient is bright awake  and alert.  He is fully oriented to year, location, self, and others.  He is oriented to reason for hospital visit.  His speech is fluent and appropriate.  Cranial nerve examination for cranial nerves II through XII does not reveal any deficits.  His motor strength is 5 out of 5 in the bilateral upper extremities.  In the lower extremities he is able to perform hip flexion knee extension and knee flexion with 5 out of 5 strength.  He has orthoses on both ankles due to of history of spina bifida and his plantar flexion is weak bilaterally.  His sensation is intact to light touch throughout.  ???  LabTests:        Hematology:  Lab Results   Component Value Date   ??? HGB 12.3 12/16/2016   ??? HCT 37.9 12/16/2016   ??? PLTCT 337 12/16/2016   ??? WBC 5.5 12/16/2016   ??? NEUT 62 12/16/2016   ??? ANC 3.40 12/16/2016   ??? ALC 1.50 12/16/2016   ??? MONA 8 12/16/2016   ??? AMC 0.40 12/16/2016   ??? ABC 0.00 12/16/2016   ??? MCV 83.1 12/16/2016   ??? MCHC 32.6 12/16/2016   ??? MPV 9.0 12/16/2016   ??? RDW 16.1 12/16/2016   ???        General Chemistry: Lab Results   Component Value Date   ??? NA 134 12/16/2016   ??? K 4.3 12/16/2016   ??? CL 103 12/16/2016   ??? CO2 23 12/16/2016   ??? BUN 15 12/16/2016   ??? CR 0.72 12/16/2016   ??? GLU 106 12/16/2016   ??? GLU 73 08/30/2016   ??? CA 9.5 12/16/2016   ??? MG 1.8 10/17/2016   ??? PO4 2.9 10/15/2016   ???  General Chemistry:         Lab Results   Component Value Date   ??? GAP 8 12/16/2016   ??? ALBUMIN 4.3 12/16/2016   ??? LACTIC 2.4 08/29/2015   ??? TOTBILI 0.3 12/16/2016   ??? TOTPROT 7.3 12/16/2016   ??? LIPASE 12 04/17/2016   ??? AST 17 12/16/2016   ??? ALT 25 12/16/2016   ??? ALKPHOS 97 12/16/2016   ??? LDH 238 05/20/2015   ???  ???  Radiology and other Diagnostics Review:    CT head today compared with CT head from November 2017 which was obtained when patient's shunt was working properly. Left ventriculoperitoneal shunt catheter seen in appropriate location extending into left frontal horn. Diffuse enlargement of lateral, 3rd, and 4th ventricles, poor sulcal differentiation consistent with shunt malfunction. Shunt series xrays were reviewed which did not show any obvious kinking, discontinuity, or break of shunt catheters.   ???  Assessment/Plan:  Allen Gill is a 26 y.o. male with complicated hydrocephalus history and spina bifida who presents with left ventriculoperitoneal shunt malfunction.  - Admit to neurosurgery  - OR for shunt revision on urgent basis.  - Dr. Joan Flores, general surgery, available for OR  - Abdominal US for evaluation of pseudocyst - Frequent neuro checks  - Discussed with staff.  ???  Randal Buba, MD  ???  Please call [phone] with questions.  ???          Cosigned by: Storm Frisk, MD at 12/17/16 1708   Revision History

## 2016-12-22 NOTE — Operative Report(Direct Entry)
OPERATIVE REPORT    PATIENT NAME:??? Allen Gill  PATIENT DOB: 02-Nov-1990    MR#:??? [id number]    DATE OF OPERATION: 12/22/2016    Surgeon(s) and Role:     Angelia Mould, MD - Primary     Simonne Martinet, MD - Assisting     * Samantha Crimes, MD - Resident - Assisting    PREOPERATIVE DIAGNOSIS:  Shunt malfunction, initial encounter [T85.618A]    POSTOPERATIVE DIAGNOSIS:  Same.    OPERATIVE PROCEDURE:  1. Procedure(s) (LRB):  CREATION SHUNT - VENTRICULO-PERITONEAL: Left side. 2 hours, proximal catheter and valve in place. will need distal catheter placed, Dr. Joan Flores to complete. Supine (Left)      ANESTHESIA:  Defer to Anesthesia      INDICATIONS FOR OPERATIVE PROCEDURE:???   Allen Gill is a 26 year old male with a history of spina bifida and shunting since childbirth.  He was recently admitted for concern of shunt malfunction.  The shunt was externalized at the level of the clavicle and allowed to drain while cultures were followed.  There is no evidence of infection during the follow-up.  Consults with infectious disease and general surgery allowed discussion and the ultimate plan was to replace the shunt back into the abdominal cavity.  All risk benefits alternatives were discussed with the patient and his family and they wish to proceed with surgery.    DESCRIPTION AND FINDINGS OF OPERATIVE PROCEDURE:???   After obtaining forms and patient was taken the operative suite where is intubated by the anesthesia service without difficulty.  Patient was transferred to the awaiting OR table in the supine position.  All pressure points were padded.  The area over his shunt valve was clipped of hair.  The hair along the chest wall and abdomen was also clipped.  The areas were cleansed with chlorhexidine scrub and alcohol.  The area for incision next to the valve was demarcated.  The area was cleansed and prepped and draped in normal sterile fashion.  Timeout was performed. Incision was made posterior to the valve with a 10 blade scalpel.  Dissection was carried down to the periosteum with Bovie electrocautery.  Soft training retractors placed in the wound and hemostasis was obtained.  The catheter was disconnected from the valve and this allowed pulling of the catheter in the caudal direction from the EVD bag by the nursing staff.  Once the previous catheter was removed the tunneler was brought into the field.    A path was tunneled through the subcutaneous space from the posterior auricular region to the abdomen avoiding the trajectory of the clavicular incision.  A stab incision was made at the abdomen and the shunt passer was extended through the incision.  The distal tubing which was not impregnated with antibiotics as the patient has an allergy to clindamycin was passed to the abdomen.  The distal shunt catheter was then attached to the nipple of the valve.  It was secured in place with a 2-0 silk suture.    General surgery was present to place the distal catheter into the abdominal  cavity laparoscopically.  Please see their separate dictation for details of their portion of the case.    The posterior auricular incision was irrigated with bacitracin irrigation.  The galea was approximated with simple inverted interrupted 2-0 Vicryl sutures.  Skin was closed with a simple running 4-0 nylon suture.  The incision was dressed in sterile fashion.    All sponge counts needle counts and  chromic counts were correct x2 within the case.  The patient was then transferred to the waiting hospital gurney and exited by the anesthesia service.  The patient was taken to PACU in stable condition following commands x4.    ESTIMATED BLOOD LOSS:???minimal.    SPECIMENS REMOVED:??? * No specimens in log *.    COMPLICATIONS:??? none.    IMPLANTS:??? Distal peritoneal catheter without antibiotic impregnation.    DRAINS:??? none.    DISPOSITION:??? PACU in stable condition.    Consuela Mimes, MD  [phone] Please call Neurosurgery Call Pager [phone]) with any questions.

## 2016-12-22 NOTE — Other
Brief Operative Note    Name: Allen Gill is a 26 y.o. male     DOB: 10-18-1990             MRN#: [id number]  DATE OF OPERATION: 12/22/2016    Date:  12/22/2016        Preoperative Dx:   Shunt malfunction, initial encounter [T85.618A]    Post-op Diagnosis      * Shunt malfunction, initial encounter [T85.618A]    Procedure(s) (LRB):  CREATION SHUNT - VENTRICULO-PERITONEAL: Left side. 2 hours, proximal catheter and valve in place. will need distal catheter placed, Dr. Joan Flores to complete. Supine (Left)    Anesthesia Type: Defer to Anesthesia    Surgeon(s) and Role:     * Angelia Mould, MD - Primary     * Simonne Martinet, MD - Assisting     * Samantha Crimes, MD - Resident - Assisting      Findings:  Left distal peritoneal catheter removed from valve without difficulty and pulled out of the field distally.  New distal tubing passed to abdomen and placed by general surgery without difficulty, valve was dripping csf at time of connection    Estimated Blood Loss: minimal    Specimen(s) Removed/Disposition: * No specimens in log *    Complications:  None    Implants: New distal peritoneal catheter, non abx impregnated    Drains: None    Disposition:  PACU - stable    Samantha Crimes, MD  Pager [phone]

## 2016-12-23 LAB — BASIC METABOLIC PANEL: Lab: 134 MMOL/L — ABNORMAL LOW (ref 137–147)

## 2016-12-23 LAB — CBC AND DIFF: Lab: 9.2 K/UL — ABNORMAL LOW (ref 4.5–11.0)

## 2016-12-23 MED ORDER — CEFAZOLIN INJ 1GM IVP
2 g | INTRAVENOUS | 0 refills | Status: CP
Start: 2016-12-23 — End: ?
  Administered 2016-12-23: 23:00:00 2 g via INTRAVENOUS

## 2016-12-23 NOTE — Progress Notes
Labs drawn from [facility] with sono x1 attempt.

## 2016-12-23 NOTE — Progress Notes
PHYSICAL THERAPY  Re-ASSESSMENT     MOBILITY:  Mobility  Progressive Mobility Level: Walk in hallway  Distance Walked (feet): 150 ft  Level of Assistance: Assist X1  Assistive Device: Walker  Time Tolerated: 11-30 minutes  Activity Limited By: Weakness    SUBJECTIVE:  Subjective  Significant hospital events: 26 y.o. male s/p externalization of ventriculoperitoneal shunt. Past history of spina bifida, hernia repair, seizures, shunt infection, s/p shunt revision 12/12  Mental / Cognitive Status: Alert;Oriented  Pain: Patient has no complaint of pain  R LE Precautions: RLE AFO: Ankle Foot Orthosis  L LE Precautions: LLE AFO: Ankle Foot Orthosis  Ambulation Assist: Independent Mobility in Community without Device  Patient Owned Equipment: Ambulance person  Home Situation: Lives Alone  Type of Home: Apartment  Entry Stairs: No Stairs  Comments: Patient planning to d/c to parents home and use RW     STRENGTH:  Strength  Overall Strength: WFL    BED MOBILITY/TRANSFERS:  Bed Mobility/Transfers  Bed Mobility: Rolling: Modified Independent  Bed Mobility: Supine to Sit: Modified Independent  Transfer Type: Sit to/from Stand  Transfer: Assistance Level: From;Bed;Modified Independent  Transfer: Assistive Device: Nurse, adult  Other Transfer Type: Sit to/from Stand  Other Transfer: Assistance Level: To/From;Bed Side Chair;Standby Assist  Other Transfer: Assistive Device: Nurse, adult  Other Transfer: Type Of Assistance: Verbal Cues;For Balance;For Strength Deficit;For Safety Considerations  End Of Activity Status: Up in Chair;Nursing Notified;Instructed Patient to Request Assist with Mobility;Instructed Patient to Use Call Light    BALANCE:  Balance  Sitting Balance: Static Sitting Balance;Dynamic Sitting Balance;No UE Support;Independent  Standing Balance: Static Standing Balance;Dynamic Standing Balance;2 UE support;Standby Assist    GAIT:  Gait  Gait Distance: 150 feet  Gait: Assistance Level: Standby Assist Gait: Assistive Device: Nurse, adult  Gait: Descriptors: Antalgic;Pace: Slow;No balance loss;Decreased step length;Variable step length  Activity Limited By: Complaint of Fatigue    EDUCATION:  Education  Persons Educated: Patient  Patient Barriers To Learning: None Noted  Teaching Methods: Verbal Instruction  Patient Response: Verbalized Understanding  Topics: Plan/Goals of PT Interventions;Safety Awareness;Up with Assist Only;Importance of Increasing Activity    ASSESSMENT/PROGRESS:  Assessment/Progress  Impaired Mobility Due To: Decreased Activity Tolerance  Assessment/Progress: Should Improve w/ Continued PT  AM-PAC 6 Clicks Basic Mobility Inpatient  Turning from your back to your side while in a flat bed without using bed rails: None  Moving from lying on your back to sitting on the side of a flatbed without using bedrails : None  Moving to and from a bed to a chair (including a wheelchair): A Little  Standing up from a chair using your arms (e.g. wheelchair, or bedside chair): None  To walk in hospital room: A Little  Climbing 3-5 steps with a railing: A Little  Raw Score: 21  Standardized (T-scale) Score: 45.55  Basic Mobility CMS 0-100%: 29.52  CMS G Code Modifier for Basic Mobility: CJ    GOALS:  Goals  Goal Formulation: With Patient  Time For Goal Achievement: 3 days  Pt Will Go Supine To/From Sit: w/ Stand By Assist, Met, New Goal, Independently  Pt Will Transfer Sit to Stand: w/ Stand By Assist, Met, New Goal, Independently  Pt Will Ambulate: Greater than 200 Feet, w/ Walker, w/ Stand By Assist, Ongoing    PLAN:  Plan   Treatment Interventions: Mobility Training;Strengthening;Balance Activities  Plan Frequency: 3-5 Days per Week  PT Plan for Next Visit: increase gait distance   P.T. mobility aide  to assist with ongoing mobilization and exercise per instructions of licensed physical therapy staff.       RECOMMENDATIONS:  PT Discharge Recommendations  PT Discharge Recommendations: Home with Assistance Equipment Recommendations: Patient owns necessary equipment     G-Codes: Mobility  [phone] Current Status: 20-39% Impairment   [id number] Goal Status:  1-19% Impairment  Based on above evaluation and clinical judgment.      Therapist: Dalphine Handing, PT, DPT, MHAM  Date: 12/23/2016

## 2016-12-23 NOTE — Progress Notes
Neurosurgery Progress Note      Admission Date: 12/16/2016 LOS: 7 days  ______________________________________________________________    S: No new concerns this AM. Discussed plan for day. Reports he needs BM. Also having more pain than he anticipated to the left side.    O:                  Vital Signs: 24 Hour Range   BP: (97-131)/(54-88)   Temp:  [36.3 C (97.4 F)-36.8 C (98.3 F)]   Pulse:  [73-106]   Respirations:  [15 PER MINUTE-19 PER MINUTE]   SpO2:  [91 %-100 %]   O2 Delivery: None (Room Air)     Physical Exam:  Awake and alert  Oriented x 3  AROM all extremities  Strength symmetric  Dressings in place      A/P: 26 y.o. male s/p externalization of ventriculoperitoneal shunt  Active Problems:    Spina bifida (HCC)    Shunt malfunction    Hydrocephalus    HTN (hypertension)    Neurogenic bladder    Continue current care  Mobilize PT/OT-baseline ready for discharge  Pain control PRN  ID following -- does not believe he was infected, recommend typical post surgical antibiotics for ppx.   Encouraged bowel program today  Discharge planning -- Home tomorrow     Prophylaxis:   A) GI: PPI  B) Lines:  No  C) Urinary Catheter:  No  D) Antibiotic Usage:  No  E) VTE:  Pharmacological prophylaxis; SQ Heparin and Mechanical prophylaxis; Sequential compression device  F) Restraints: Patient assessed for need for restraints.     Please call [phone] with any questions.    Blima Rich, APRN  [phone]

## 2016-12-23 NOTE — Progress Notes
OCCUPATIONAL THERAPY  NO TREATMENT/DISCHARGE NOTE     Per discussion with PT, patient demonstrated LE dressing and ambulated without assistance.  Patient is safe to discharge home with family assistance.       Therapist: Maxine Glenn , OTR/L   Date: 12/23/2016

## 2016-12-24 ENCOUNTER — Emergency Department: Admit: 2016-12-16 | Discharge: 2016-12-16 | Payer: MEDICARE

## 2016-12-24 ENCOUNTER — Encounter: Admit: 2016-12-24 | Discharge: 2016-12-24 | Payer: MEDICARE

## 2016-12-24 ENCOUNTER — Inpatient Hospital Stay: Admit: 2016-12-19 | Discharge: 2016-12-19 | Payer: MEDICARE

## 2016-12-24 DIAGNOSIS — I10 Essential (primary) hypertension: ICD-10-CM

## 2016-12-24 DIAGNOSIS — Z936 Other artificial openings of urinary tract status: ICD-10-CM

## 2016-12-24 DIAGNOSIS — G4733 Obstructive sleep apnea (adult) (pediatric): ICD-10-CM

## 2016-12-24 DIAGNOSIS — N200 Calculus of kidney: ICD-10-CM

## 2016-12-24 DIAGNOSIS — G919 Hydrocephalus, unspecified: ICD-10-CM

## 2016-12-24 DIAGNOSIS — T8501XA Breakdown (mechanical) of ventricular intracranial (communicating) shunt, initial encounter: Principal | ICD-10-CM

## 2016-12-24 DIAGNOSIS — N319 Neuromuscular dysfunction of bladder, unspecified: ICD-10-CM

## 2016-12-24 DIAGNOSIS — R319 Hematuria, unspecified: ICD-10-CM

## 2016-12-24 DIAGNOSIS — G9389 Other specified disorders of brain: ICD-10-CM

## 2016-12-24 DIAGNOSIS — R569 Unspecified convulsions: ICD-10-CM

## 2016-12-24 DIAGNOSIS — Z906 Acquired absence of other parts of urinary tract: ICD-10-CM

## 2016-12-24 DIAGNOSIS — N390 Urinary tract infection, site not specified: ICD-10-CM

## 2016-12-24 DIAGNOSIS — R51 Headache: ICD-10-CM

## 2016-12-24 DIAGNOSIS — Q0700 Arnold-Chiari syndrome without spina bifida or hydrocephalus: ICD-10-CM

## 2016-12-24 DIAGNOSIS — F988 Other specified behavioral and emotional disorders with onset usually occurring in childhood and adolescence: ICD-10-CM

## 2016-12-24 DIAGNOSIS — Q059 Spina bifida, unspecified: Principal | ICD-10-CM

## 2016-12-24 DIAGNOSIS — T85618A Breakdown (mechanical) of other specified internal prosthetic devices, implants and grafts, initial encounter: ICD-10-CM

## 2016-12-24 LAB — CBC AND DIFF: Lab: 6.5 K/UL — ABNORMAL LOW (ref 4.5–11.0)

## 2016-12-24 LAB — BASIC METABOLIC PANEL: Lab: 137 MMOL/L — ABNORMAL LOW (ref 137–147)

## 2016-12-24 MED ORDER — OXYCODONE 5 MG PO TAB
5-10 mg | ORAL_TABLET | ORAL | 0 refills | 6.00000 days | Status: AC | PRN
Start: 2016-12-24 — End: 2017-05-13
  Filled 2016-12-24 (×2): qty 60, 5d supply, fill #1

## 2016-12-24 MED ORDER — SENNOSIDES-DOCUSATE SODIUM 8.6-50 MG PO TAB
1 | ORAL | 0 refills | 30.00000 days | Status: DC | PRN
Start: 2016-12-24 — End: 2017-01-07

## 2016-12-24 NOTE — Progress Notes
Neurosurgery Progress Note      Admission Date: 12/16/2016 LOS: 8 days  ______________________________________________________________    S: Doing well this AM, feels ready for home. Asking for home health to start with at discharge.     O:                  Vital Signs: 24 Hour Range   BP: (92-113)/(48-89)   Temp:  [36.4 C (97.5 F)-37.2 C (99 F)]   Pulse:  [86-112]   Respirations:  [16 PER MINUTE-18 PER MINUTE]   SpO2:  [92 %-99 %]   O2 Delivery: None (Room Air)     Physical Exam:  Awake and alert  Oriented x 3  MAE, follows commands  Strength symmetric  Dressings in place      A/P: 26 y.o. male s/p externalization of ventriculoperitoneal shunt  Active Problems:    Spina bifida (HCC)    Shunt malfunction    Hydrocephalus    HTN (hypertension)    Neurogenic bladder    Continue current care  Mobilize PT/OT-baseline ready for discharge-home health PT  Pain control PRN  ID following -- does not believe he was infected, recommend typical post surgical antibiotics for ppx.   Reports result with increase in bowel program yesterday  Discharge planning -- home today    Prophylaxis:   A) GI: PPI  B) Lines:  No  C) Urinary Catheter:  No  D) Antibiotic Usage:  No  E) VTE:  Pharmacological prophylaxis; SQ Heparin and Mechanical prophylaxis; Sequential compression device  F) Restraints: Patient assessed for need for restraints.     Please call [phone] with any questions.    Blima Rich, APRN  [phone]

## 2016-12-24 NOTE — Progress Notes
Removed peripheral line and review d/c orders with pt.  Scripts sent with pt.  No further questions at this time.

## 2016-12-27 ENCOUNTER — Encounter: Admit: 2016-12-27 | Discharge: 2016-12-27 | Payer: MEDICARE

## 2016-12-27 NOTE — Telephone Encounter
Hospital Discharge Follow Up      Reached Patient:Yes     Admission Information:     Hospital Name: [facility]  Admission Date: 12/16/2016  Discharge Date: 12/24/2016  Discharge Diagnosis: Discharge summary not available. Shunt Malfunction, HTN, Neurogenic Bladder  Was this a readmission? No  If yes, reason: [facility]: Unplanned  Today's call is 1(business) days post discharge      Discharge Instruction Review   Did patient receive and understand discharge instructions? Yes    Home Health ordered? Yes                 Agency name/telephone number: [place]   Has Home Health agency contacted patient? Yes   Caregiver assistance in the home? na   Are there concerns regarding the patient's ADL'S? No  Is patient a fall risk? Yes    Special diet? No If yes, type: Reg      Medication Reconciliation    Changes to pre-hospital medications? Yes  Were new prescriptions filled?Yes    Understanding Condition   Having any current symptoms? No  Patient understands when to seek additional medical care? Yes   Other instructions provided : na      Scheduling Follow-up Appointment   Upcoming appointment date and time and with whom scheduled:   Future Appointments   Date Time Provider Department Center   12/31/2016  9:00 AM [id number]   01/07/2017 10:00 AM [facility]UROSURGERY NP [facility]   01/19/2017  9:00 AM Angelia Mould, MD J[facility] Panda [facility]   01/21/2017  1:30 PM Annice Pih., MD [facility]     PCP appointment scheduled?No, patient declined appointment  PCP primary location: [facility]  Specialist appointment scheduled? Yes, with Neurology 01/07/2017  Both PCP and Specialist appointment scheduled: No  Is assistance with transportation needed?No        [place] says he is doing well.  It was very difficult to hear him speak because of a poor phone connection. He declines to schedule with PCP. He intends to cancel his cardiology appointment until after is Neuro appointment. Reviewed the AVS and med changes. He denies questions today.

## 2016-12-28 ENCOUNTER — Encounter: Admit: 2016-12-28 | Discharge: 2016-12-28 | Payer: MEDICARE

## 2016-12-28 NOTE — Telephone Encounter
States patient returned home from hospital over the weekend - was discharged with orders for home health - does have an order for PT but did not have an order for OT - would like an order for this if ok with Dr. Freida Busman.  Returned call at 1 - spoke with nurse - states he does have nursing also - has an incision and ureterostomy.  Wanted to make sure Dr. Freida Busman will follow - did let her know he will be glad to.

## 2016-12-28 NOTE — Telephone Encounter
agreed

## 2016-12-28 NOTE — Discharge Instructions - Pharmacy
Physician Discharge Summary      Name: Allen Gill  Medical Record Number: [id number]        Account Number:  [id number]  Date Of Birth:  [birth year]                         Age:  26 years   Admit date:  12/16/2016                     Discharge date:  12/24/2016    Attending Physician:  Angelia Mould, MD               Service: [facility]    Physician Summary completed by: Blima Rich, APRN    Reason for hospitalization: ventriculoperitoneal shunt failure    Significant PMH:   Past Medical History:   Diagnosis Date   ??? ADD (attention deficit disorder)     without hyperactivity   ??? Arnold-Chiari malformation (HCC)    ??? Headache(784.0)    ??? Hydrocephalus    ??? Infection of VP shunt    ??? Kidney stones     frequent   ??? Myelomeningocele (HCC)    ??? Neurogenic bladder    ??? OSA (obstructive sleep apnea)    ??? 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 Shunt malfunction    ??? Spina bifida (HCC)    ??? Urinary tract infection     frequent          Allergies: Latex; Amoxicillin; Ceclor [cefaclor]; Clindamycin; and Zosyn [piperacillin-tazobactam]    Admission Physical Exam notable for:     26 y.o.   male with a history of spina bifida and a complex hydrocephalus history.  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  In May 2017 he developed a pleural empyema which necessitated externalization of his distal ventriculoperitoneal shunt catheter.  CSF fluid sent for testing at that time returned with infected.  Thus he had a ventriculostomy placed and bilateral intraventricular shunt catheters were removed.  After treatment with antibiotics and resolution of his CSF infection a new left ventriculoperitoneal shunt was placed with assistance from Dr. Joan Flores, general surgeon.  ??? Around 4 AM this morning the patient developed blurry vision as well as headaches.  He was concerned because the symptoms have been consistent with his shunt revisions in the past.  He arranged to be transported to the ER at [facility].  When he was talking to first responders he also noticed at that point that his speech was slurred which is also concerning symptom for his shunt malfunctions.  He states that other times  his shunt has malfunctioned he is also developed paresthesias and weakness in his extremities.  He has not had this occur yet.  Currently he has a mild headache. Admitted for further evaluation 12/16/2016.          Admission Lab/Radiology studies notable for: CT head    Brief Hospital Course:  The patient was admitted and the following issues were addressed during this hospitalization: (with pertinent details).    Admitted to hospital 12/16/16. Patient was found to have a fluid collection in his abdomen that was concerning for pseudocyst. ID was consulted and followed throughout admission. Shunt extrenalized at the clavicle 12/16/16, distal catheter was removed.  Attempted to aspirate fluid in abdomen but very little fluid available to be aspirated. CSF studies remained negative, and okay was given to replace ventriculoperitoneal shunt. OR 12/22/16 Distal catheter revision/replacement placed in peritoneal space. POD 1 doing well, constipated from stay, increased bowel program with success. POD 2 incisions remain C/D/I without redness or drainage. Patient remained at his baseline level of function. Discharged to home with home health 12/24/16.       Condition at Discharge: Stable    Discharge Diagnoses:       Hospital Problems        Active Problems    Spina bifida [facility])    Shunt malfunction    Hydrocephalus    HTN (hypertension)    Neurogenic bladder          Surgical Procedures: 12/16/16 ventriculoperitoneal shunt extrenalized at clavicle, distal catheter removed. 12/22/16 Distal catheter revision/replacement placed in peritoneal space.    Significant Diagnostic Studies and Procedures: radiology: CT scan: head/abdomen. CSF studies negative.     Consults:  ID    Patient Disposition: Home with Home Health Care       Patient instructions/medications:      Activity as Tolerated    It is important to keep increasing your activity level after you leave the hospital.  Moving around can help prevent blood clots, lung infection (pneumonia) and other problems.  Gradually increasing the number of times you are up moving around will help you return to your normal activity level more quickly.  Continue to increase the number of times you are up to the chair and walking daily to return to your normal activity level. Begin to work toward your normal activity level at discharge. As tolerated; No driving until cleared by your surgeon at follow up appointment. Avoid pulling, pushing or lifting greater than 10 pounds.     Report These Signs and Symptoms    Please contact your doctor if you have any of the following symptoms: Call if temperature greater than 101, incision red, drainage or odor noted from incision, pain that is uncontrolled with pain medication, numbness or weakness, vision changes, trouble with speech or slurring of speech, or any questions/concerns.     Questions About Your Stay    For questions or concerns regarding your hospital stay. Call [phone]     Discharging attending physician: Angelia Mould [[id number]]      Regular Diet    You have no dietary restriction. Please continue with a healthy balanced diet.     Incision Care    *Keep your incision clean and dry.  *May get incisions wet 5 days after surgery. Then gently wash incision and pat dry.   *Do not submerge incision in tub, pool, hot tub, or lake for 6 weeks.  *Your incision should gradually look better each day. If you notice unusual swelling, redness, drainage, have increasing pain at the site, or have a fever greater than 100 degrees, notify your physician immediately.     Opioid (Narcotic) Safety Information    OPIOID (NARCOTIC) PAIN MEDICATION SAFETY    We care about your comfort, and believe you need opioid medications at this time to treat your pain.  An opioid is a strong pain medication.  It is only available by prescription for moderate to severe pain.  Usually these medications are used for only a short time to treat pain, but sometimes will be prescribed for longer.  Talk with your doctor or nurse about how long they expect you to  need this medication.    When used the right way, opioids are safe and effective medications to treat your pain, even when used for a long time.  Yet, when used in the wrong way, opioids can be dangerous for you or others.  Opioids do not work for everyone.  Most patients do not get full relief of their pain from opioid medication; full relief of your pain may not be possible.     For your safety, we ask you to follow these instructions:    *Only take your opioid medication as prescribed.  If your pain is not controlled with the prescribed dose, or the medication is not lasting long enough, call your doctor.  *Do not break or crush your opioid medication unless your doctor or pharmacist says you can.  With certain medications, this can be dangerous, and may cause death.  *Never share your medications with others, even if they appear to have a good reason.  Never take someone else's pain medication-this is dangerous, and illegal (a crime).  Overdoses and deaths have occurred.  *Keep your opioid medications safe, as you would with cash, in a lock box or similar container.  *Make sure your opioids are going to be secure, especially if you are around children or teens.  *Talk with your doctor or pharmacist before you take other medications.  *Avoid driving, operating machinery, or drinking alcohol while taking opioid pain medication.  This may be unsafe. Pain medications can cause constipation. Constipation is bowel movements that are less often than normal. Stools often become very hard and difficult to pass. This may lead to stomach pain and bloating. It may also cause pain when trying to use the bathroom. Constipation may be treated with suppositories, laxatives or stool softeners. A diet high in fiber with plenty of fluids helps to maintain regular, soft bowel movements.      Current Discharge Medication List       START taking these medications    Details   oxyCODONE (ROXICODONE, OXY-IR) 5 mg tablet Take one tablet to two tablets by mouth every 4 hours as needed.  Qty: 60 tablet, Refills: 0    PRESCRIPTION TYPE:  Print      senna/docusate (SENOKOT-S) 8.6/50 mg tablet Take one tablet by mouth as Needed.  Refills: 0    PRESCRIPTION TYPE:  OTC          CONTINUE these medications which have NOT CHANGED    Details   acetaminophen (TYLENOL) 325 mg tablet Take two tablets by mouth four times daily.  Refills: 0    PRESCRIPTION TYPE:  OTC      carvedilol (COREG) 12.5 mg tablet Take one tablet by mouth twice daily with meals. Take with food.  Qty: 180 tablet, Refills: 3    PRESCRIPTION TYPE:  Normal      imipramine (TOFRANIL) 25 mg tablet TAKE 2 TABLETS BY MOUTH IN THE MORNING AND 1 TABLET IN THE EVENING  Qty: 270 tablet, Refills: 0    PRESCRIPTION TYPE:  Normal      levETIRAcetam (KEPPRA) 500 mg tablet Take 1 tablet by mouth twice daily.  Qty: 60 tablet, Refills: 11    PRESCRIPTION TYPE:  Normal  Comments: Please call to be seen      lisinopril (PRINIVIL; ZESTRIL) 5 mg tablet Take one tablet by mouth daily.  Qty: 90 tablet, Refills: 3    PRESCRIPTION TYPE:  Normal      Miscellaneous Medical Supply misc Urostomy supplies and accessories  Dispense one month of supplies  Neurogenic bladder [N31.9]  Qty: 10 Device, Refills: 99    PRESCRIPTION TYPE:  Print      MV,Ca,Min-Iron-FA-Lycopene (CENTRUM MEN) 8 mg iron- 200 mcg-600 mcg tab Take 1 Tab by mouth daily. PRESCRIPTION TYPE:  Historical Med      spironolactone (ALDACTONE) 25 mg tablet Take 1 tablet by mouth daily. Take with food.  Qty: 90 tablet, Refills: 3    PRESCRIPTION TYPE:  Normal              Scheduled appointments:    Dec 31, 2016  9:00 AM CST  Echo Doppler with [facility]  Cardiovascular Medicine ([facility] [address]  [address]  [place] [address]  [phone]   Jan 07, 2017 10:00 AM CST  Post - Op with NEUROSURGERY NP CLINIC  [facility] ([facility]) [address]  [place] [address]  [phone]   Jan 19, 2017  9:00 AM CST  Post - Op with Angelia Mould, MD  [facility] - Neurosurgery [facility]) Ortho and [facility] Level 2B  [address]  [place] [address]  [phone]   Jan 21, 2017  1:30 PM CST  Return Patient with Guy Sandifer. Freida Busman, MD  [facility]) [facility] [address]  [address]  [place] [address]  [phone]          Pending items needing follow up: none.    Signed:  Blima Rich, APRN  12/28/2016      cc:  Primary Care Physician:  Annice Pih   Verified  Referring physicians:  Annice Pih., MD   Additional provider(s):

## 2016-12-31 ENCOUNTER — Encounter: Admit: 2016-12-31 | Discharge: 2016-12-31 | Payer: MEDICARE

## 2016-12-31 NOTE — Telephone Encounter
States she completed her OT evaluation with this patient yesterday - plans to see him 1 x this week - 1 x next week and 2 x the following week - if doing well will discharge at this time.

## 2017-01-02 ENCOUNTER — Encounter: Admit: 2017-01-02 | Discharge: 2017-01-02 | Payer: MEDICARE

## 2017-01-03 MED ORDER — LEVETIRACETAM 500 MG PO TAB
500 mg | ORAL_TABLET | Freq: Two times a day (BID) | ORAL | 3 refills | 90.00000 days | Status: AC
Start: 2017-01-03 — End: 2017-01-28

## 2017-01-07 ENCOUNTER — Encounter: Admit: 2017-01-07 | Discharge: 2017-01-07 | Payer: MEDICARE

## 2017-01-07 ENCOUNTER — Ambulatory Visit: Admit: 2017-01-07 | Discharge: 2017-01-08 | Payer: MEDICARE

## 2017-01-07 DIAGNOSIS — R569 Unspecified convulsions: ICD-10-CM

## 2017-01-07 DIAGNOSIS — Q059 Spina bifida, unspecified: Principal | ICD-10-CM

## 2017-01-07 DIAGNOSIS — R51 Headache: ICD-10-CM

## 2017-01-07 DIAGNOSIS — N200 Calculus of kidney: ICD-10-CM

## 2017-01-07 DIAGNOSIS — Q0700 Arnold-Chiari syndrome without spina bifida or hydrocephalus: ICD-10-CM

## 2017-01-07 DIAGNOSIS — G919 Hydrocephalus, unspecified: ICD-10-CM

## 2017-01-07 DIAGNOSIS — F988 Other specified behavioral and emotional disorders with onset usually occurring in childhood and adolescence: ICD-10-CM

## 2017-01-07 DIAGNOSIS — T85618A Breakdown (mechanical) of other specified internal prosthetic devices, implants and grafts, initial encounter: ICD-10-CM

## 2017-01-07 DIAGNOSIS — Z4541 Encounter for adjustment and management of cerebrospinal fluid drainage device: Principal | ICD-10-CM

## 2017-01-07 DIAGNOSIS — N390 Urinary tract infection, site not specified: ICD-10-CM

## 2017-01-07 DIAGNOSIS — N319 Neuromuscular dysfunction of bladder, unspecified: ICD-10-CM

## 2017-01-07 DIAGNOSIS — Z4802 Encounter for removal of sutures: ICD-10-CM

## 2017-01-07 DIAGNOSIS — G4733 Obstructive sleep apnea (adult) (pediatric): ICD-10-CM

## 2017-01-07 NOTE — Progress Notes
Allen Gill returns today for 2 wk f/u and suture removal s/p REVISION SHUNT - VENTRICULO-PERITONEAL  and CREATION SHUNT - VENTRICULO-PERITONEAL on 12/22/16 with Dr Lawson Fiscal.  He has with a long history of spina bifida and is shunt dependent.  He reports his pre-surgical visual disturbance and headaches have completely resolved.  He does report a generalized weakness most noted in his bilateral lower extremities.  He is using a quad cane just for safety and uneven surfaces.  He is currently staying with his parents and participating in home PT.  He feels it's going well and he will be returning to his own apartment soon.  He is eating/drinking well.  Denies constipation.  He reports minimal incisional pain. He would like to return to work about 01/20/2017.    Physical Exam    Alert/Fully Oriented  Neuro Stable and Intact at previous baseline.   Motor/Strength 5/5  Gait Steady with quad cane.   Cranial, clavicle, and abdominal incisions are all well approximated and healing well.   No S/S of infection noted.     Allen Gill returns today for suture removal and 2 wk f/u. His sutures were removed without difficulty. Wound care discussed.  He appears to be recovering well.  His generalized lower extremity weakness appears to be improving with PT.  He should continue with PT as he's improving.  He may return to work and I will give him a letter to that effect.  He would like to begin with half days and increase as tolerated.  I also did a shunt check today as there was no documentation of his shunt setting since 05/2015.  His shunt is a Armed forces logistics/support/administrative officer at 4cm.  No adjustment was made today.   He will return for surgical follow up with Dr. Lawson Fiscal on 01/19/2017.

## 2017-01-07 NOTE — Procedures
Indications for Procedure:  Confirm Shunt Setting    Shunt Programming:   Location: Right Frontal   Type: Codman Certas   Pre Procedure Setting: 4   Current Setting: 4    Verification by Xray: No    Complication of Programming: None

## 2017-01-10 ENCOUNTER — Encounter: Admit: 2017-01-10 | Discharge: 2017-01-10 | Payer: MEDICARE

## 2017-01-10 NOTE — Telephone Encounter
Patient was seeing occupational therapy. They called to let you know that his therapy has been completed and he is discharged.

## 2017-01-14 ENCOUNTER — Encounter: Admit: 2017-01-14 | Discharge: 2017-01-14 | Payer: MEDICARE

## 2017-01-14 ENCOUNTER — Emergency Department: Admit: 2017-01-14 | Discharge: 2017-01-14 | Payer: MEDICARE

## 2017-01-14 ENCOUNTER — Emergency Department: Admit: 2017-01-14 | Discharge: 2017-01-14 | Disposition: A | Discharge status: Home / Self Care | Payer: MEDICARE

## 2017-01-14 ENCOUNTER — Ambulatory Visit: Admit: 2017-01-14 | Discharge: 2017-01-15 | Payer: MEDICARE

## 2017-01-14 DIAGNOSIS — F988 Other specified behavioral and emotional disorders with onset usually occurring in childhood and adolescence: ICD-10-CM

## 2017-01-14 DIAGNOSIS — R079 Chest pain, unspecified: Principal | ICD-10-CM

## 2017-01-14 DIAGNOSIS — N200 Calculus of kidney: ICD-10-CM

## 2017-01-14 DIAGNOSIS — R569 Unspecified convulsions: ICD-10-CM

## 2017-01-14 DIAGNOSIS — G919 Hydrocephalus, unspecified: ICD-10-CM

## 2017-01-14 DIAGNOSIS — Q059 Spina bifida, unspecified: ICD-10-CM

## 2017-01-14 DIAGNOSIS — J189 Pneumonia, unspecified organism: Principal | ICD-10-CM

## 2017-01-14 DIAGNOSIS — N390 Urinary tract infection, site not specified: ICD-10-CM

## 2017-01-14 DIAGNOSIS — R51 Headache: ICD-10-CM

## 2017-01-14 DIAGNOSIS — Z9889 Other specified postprocedural states: ICD-10-CM

## 2017-01-14 DIAGNOSIS — G4733 Obstructive sleep apnea (adult) (pediatric): ICD-10-CM

## 2017-01-14 DIAGNOSIS — T85618A Breakdown (mechanical) of other specified internal prosthetic devices, implants and grafts, initial encounter: ICD-10-CM

## 2017-01-14 DIAGNOSIS — Q0700 Arnold-Chiari syndrome without spina bifida or hydrocephalus: ICD-10-CM

## 2017-01-14 DIAGNOSIS — N319 Neuromuscular dysfunction of bladder, unspecified: ICD-10-CM

## 2017-01-14 DIAGNOSIS — Z7409 Other reduced mobility: ICD-10-CM

## 2017-01-14 LAB — POC CREATININE, RAD: Lab: 0.9 mg/dL (ref 0.4–1.24)

## 2017-01-14 LAB — COMPREHENSIVE METABOLIC PANEL
Lab: 102 MMOL/L — ABNORMAL LOW (ref 98–110)
Lab: 13 U/L (ref 7–40)
Lab: 134 MMOL/L — ABNORMAL LOW (ref 137–147)
Lab: 20 U/L (ref 7–56)
Lab: 27 mg/dL — ABNORMAL HIGH (ref 7–25)
Lab: 4.4 MMOL/L — ABNORMAL LOW (ref 3.5–5.1)
Lab: 4.5 g/dL (ref 3.5–5.0)
Lab: 8 g/dL — ABNORMAL HIGH (ref 6.0–8.0)
Lab: 92 mg/dL (ref 70–100)
Lab: >60 mL/min (ref >60)

## 2017-01-14 LAB — CBC AND DIFF
Lab: 0.3 10*3/uL (ref 0–0.45)
Lab: 8.7 10*3/uL (ref 4.5–11.0)

## 2017-01-14 MED ORDER — AZITHROMYCIN 250 MG PO TAB
ORAL_TABLET | Freq: Every day | 0 refills | Status: AC
Start: 2017-01-14 — End: 2017-04-13

## 2017-01-14 MED ORDER — IOHEXOL 350 MG IODINE/ML IV SOLN
75 mL | Freq: Once | INTRAVENOUS | 0 refills | Status: CP
Start: 2017-01-14 — End: ?

## 2017-01-14 MED ORDER — SODIUM CHLORIDE 0.9 % IV SOLP
1000 mL | INTRAVENOUS | 0 refills | Status: CP
Start: 2017-01-14 — End: ?
  Administered 2017-01-14: 21:00:00 1000 mL via INTRAVENOUS

## 2017-01-14 MED ORDER — SODIUM CHLORIDE 0.9 % IJ SOLN
50 mL | Freq: Once | INTRAVENOUS | 0 refills | Status: CP
Start: 2017-01-14 — End: ?
  Administered 2017-01-14: 21:00:00 50 mL via INTRAVENOUS

## 2017-01-17 ENCOUNTER — Encounter: Admit: 2017-01-17 | Discharge: 2017-01-17 | Payer: MEDICARE

## 2017-01-17 DIAGNOSIS — R51 Headache: ICD-10-CM

## 2017-01-17 DIAGNOSIS — N200 Calculus of kidney: ICD-10-CM

## 2017-01-17 DIAGNOSIS — G4733 Obstructive sleep apnea (adult) (pediatric): ICD-10-CM

## 2017-01-17 DIAGNOSIS — Q0700 Arnold-Chiari syndrome without spina bifida or hydrocephalus: ICD-10-CM

## 2017-01-17 DIAGNOSIS — G919 Hydrocephalus, unspecified: ICD-10-CM

## 2017-01-17 DIAGNOSIS — N319 Neuromuscular dysfunction of bladder, unspecified: ICD-10-CM

## 2017-01-17 DIAGNOSIS — Q059 Spina bifida, unspecified: Secondary | ICD-10-CM

## 2017-01-17 DIAGNOSIS — F988 Other specified behavioral and emotional disorders with onset usually occurring in childhood and adolescence: ICD-10-CM

## 2017-01-17 DIAGNOSIS — N390 Urinary tract infection, site not specified: ICD-10-CM

## 2017-01-17 DIAGNOSIS — R569 Unspecified convulsions: ICD-10-CM

## 2017-01-17 DIAGNOSIS — T85618A Breakdown (mechanical) of other specified internal prosthetic devices, implants and grafts, initial encounter: ICD-10-CM

## 2017-01-17 LAB — CULTURE-FUNGAL,CSF

## 2017-01-18 ENCOUNTER — Encounter: Admit: 2017-01-18 | Discharge: 2017-01-18 | Payer: MEDICARE

## 2017-01-19 ENCOUNTER — Ambulatory Visit: Admit: 2017-01-19 | Discharge: 2017-01-20 | Payer: MEDICARE

## 2017-01-19 ENCOUNTER — Encounter: Admit: 2017-01-19 | Discharge: 2017-01-19 | Payer: MEDICARE

## 2017-01-19 DIAGNOSIS — R569 Unspecified convulsions: ICD-10-CM

## 2017-01-19 DIAGNOSIS — Q059 Spina bifida, unspecified: Secondary | ICD-10-CM

## 2017-01-19 DIAGNOSIS — R51 Headache: ICD-10-CM

## 2017-01-19 DIAGNOSIS — T85618A Breakdown (mechanical) of other specified internal prosthetic devices, implants and grafts, initial encounter: ICD-10-CM

## 2017-01-19 DIAGNOSIS — Q0700 Arnold-Chiari syndrome without spina bifida or hydrocephalus: ICD-10-CM

## 2017-01-19 DIAGNOSIS — Z982 Presence of cerebrospinal fluid drainage device: Principal | ICD-10-CM

## 2017-01-19 DIAGNOSIS — F988 Other specified behavioral and emotional disorders with onset usually occurring in childhood and adolescence: ICD-10-CM

## 2017-01-19 DIAGNOSIS — N200 Calculus of kidney: ICD-10-CM

## 2017-01-19 DIAGNOSIS — N319 Neuromuscular dysfunction of bladder, unspecified: ICD-10-CM

## 2017-01-19 DIAGNOSIS — G919 Hydrocephalus, unspecified: ICD-10-CM

## 2017-01-19 DIAGNOSIS — N390 Urinary tract infection, site not specified: ICD-10-CM

## 2017-01-19 DIAGNOSIS — G4733 Obstructive sleep apnea (adult) (pediatric): ICD-10-CM

## 2017-01-21 ENCOUNTER — Ambulatory Visit: Admit: 2017-01-21 | Discharge: 2017-01-22 | Payer: MEDICARE

## 2017-01-21 DIAGNOSIS — Q0700 Arnold-Chiari syndrome without spina bifida or hydrocephalus: ICD-10-CM

## 2017-01-21 DIAGNOSIS — J189 Pneumonia, unspecified organism: Principal | ICD-10-CM

## 2017-01-21 DIAGNOSIS — Z936 Other artificial openings of urinary tract status: ICD-10-CM

## 2017-01-21 DIAGNOSIS — Q052 Lumbar spina bifida with hydrocephalus: ICD-10-CM

## 2017-01-24 ENCOUNTER — Encounter: Admit: 2017-01-24 | Discharge: 2017-01-24 | Payer: MEDICARE

## 2017-01-28 ENCOUNTER — Encounter: Admit: 2017-01-28 | Discharge: 2017-01-28 | Payer: MEDICARE

## 2017-01-28 MED ORDER — LEVETIRACETAM 500 MG PO TAB
500 mg | ORAL_TABLET | Freq: Two times a day (BID) | ORAL | 6 refills | 90.00000 days | Status: AC
Start: 2017-01-28 — End: 2017-08-17

## 2017-02-02 ENCOUNTER — Encounter: Admit: 2017-02-02 | Discharge: 2017-02-02 | Payer: MEDICARE

## 2017-02-02 MED ORDER — IMIPRAMINE HCL 25 MG PO TAB
ORAL_TABLET | 0 refills
Start: 2017-02-02 — End: ?

## 2017-03-01 ENCOUNTER — Ambulatory Visit: Admit: 2017-03-01 | Discharge: 2017-03-01 | Payer: MEDICARE

## 2017-03-01 DIAGNOSIS — I420 Dilated cardiomyopathy: Principal | ICD-10-CM

## 2017-03-01 MED ORDER — PERFLUTREN LIPID MICROSPHERES 1.1 MG/ML IV SUSP
1-20 mL | Freq: Once | INTRAVENOUS | 0 refills | Status: CP
Start: 2017-03-01 — End: ?
  Administered 2017-03-01: 16:00:00 2 mL via INTRAVENOUS

## 2017-03-07 ENCOUNTER — Encounter: Admit: 2017-03-07 | Discharge: 2017-03-07 | Payer: MEDICARE

## 2017-04-13 ENCOUNTER — Encounter: Admit: 2017-04-13 | Discharge: 2017-04-13 | Payer: MEDICARE

## 2017-04-13 ENCOUNTER — Emergency Department: Admit: 2017-04-13 | Discharge: 2017-04-13 | Payer: MEDICARE

## 2017-04-13 ENCOUNTER — Inpatient Hospital Stay: Admit: 2017-04-13 | Discharge: 2017-04-19 | Disposition: A | Discharge status: Home Health | Payer: MEDICARE

## 2017-04-13 DIAGNOSIS — G4440 Drug-induced headache, not elsewhere classified, not intractable: ICD-10-CM

## 2017-04-13 DIAGNOSIS — R569 Unspecified convulsions: ICD-10-CM

## 2017-04-13 DIAGNOSIS — G4733 Obstructive sleep apnea (adult) (pediatric): ICD-10-CM

## 2017-04-13 DIAGNOSIS — N12 Tubulo-interstitial nephritis, not specified as acute or chronic: ICD-10-CM

## 2017-04-13 DIAGNOSIS — Q039 Congenital hydrocephalus, unspecified: ICD-10-CM

## 2017-04-13 DIAGNOSIS — Q059 Spina bifida, unspecified: Principal | ICD-10-CM

## 2017-04-13 DIAGNOSIS — N319 Neuromuscular dysfunction of bladder, unspecified: ICD-10-CM

## 2017-04-13 DIAGNOSIS — N200 Calculus of kidney: ICD-10-CM

## 2017-04-13 DIAGNOSIS — Q0700 Arnold-Chiari syndrome without spina bifida or hydrocephalus: ICD-10-CM

## 2017-04-13 DIAGNOSIS — T85618A Breakdown (mechanical) of other specified internal prosthetic devices, implants and grafts, initial encounter: ICD-10-CM

## 2017-04-13 DIAGNOSIS — G919 Hydrocephalus, unspecified: ICD-10-CM

## 2017-04-13 DIAGNOSIS — F988 Other specified behavioral and emotional disorders with onset usually occurring in childhood and adolescence: ICD-10-CM

## 2017-04-13 DIAGNOSIS — R51 Headache: ICD-10-CM

## 2017-04-13 DIAGNOSIS — N390 Urinary tract infection, site not specified: ICD-10-CM

## 2017-04-13 LAB — BASIC METABOLIC PANEL
Lab: 1.5 mg/dL — ABNORMAL HIGH (ref 0.4–1.24)
Lab: 137 MMOL/L (ref 137–147)
Lab: 5.6 MMOL/L — ABNORMAL HIGH (ref 3.5–5.1)
Lab: 53 mL/min — ABNORMAL LOW (ref >60)
Lab: 9.2 mg/dL (ref 8.5–10.6)
Lab: >60 mL/min (ref >60)

## 2017-04-13 LAB — URINALYSIS DIPSTICK REFLEX TO CULTURE
Lab: POSITIVE K/UL — AB (ref 3–12)
Lab: POSITIVE mL/min — AB (ref 0–0.20)

## 2017-04-13 LAB — SED RATE: Lab: 65 mm/h — ABNORMAL HIGH (ref 0–15)

## 2017-04-13 LAB — URINALYSIS MICROSCOPIC REFLEX TO CULTURE

## 2017-04-13 LAB — CBC AND DIFF
Lab: 4.4 M/UL — ABNORMAL HIGH (ref 4.4–5.5)
Lab: 6.3 10*3/uL (ref 4.5–11.0)

## 2017-04-13 LAB — COMPREHENSIVE METABOLIC PANEL
Lab: 136 MMOL/L — ABNORMAL LOW (ref 137–147)
Lab: 5.7 MMOL/L — ABNORMAL HIGH (ref 3.5–5.1)

## 2017-04-13 LAB — C REACTIVE PROTEIN (CRP): Lab: 0.8 mg/dL (ref <1.0)

## 2017-04-13 LAB — POC LACTATE: Lab: 0.5 MMOL/L (ref 0.5–2.0)

## 2017-04-13 MED ORDER — FUROSEMIDE 10 MG/ML IJ SOLN
20 mg | Freq: Once | INTRAVENOUS | 0 refills | Status: CP
Start: 2017-04-13 — End: ?
  Administered 2017-04-14: 02:00:00 20 mg via INTRAVENOUS

## 2017-04-13 MED ORDER — SODIUM CHLORIDE 0.9 % IV SOLP
1000 mL | INTRAVENOUS | 0 refills | Status: CP
Start: 2017-04-13 — End: ?
  Administered 2017-04-13: 16:00:00 1000 mL via INTRAVENOUS

## 2017-04-13 MED ORDER — CARVEDILOL 12.5 MG PO TAB
12.5 mg | Freq: Two times a day (BID) | ORAL | 0 refills | Status: DC
Start: 2017-04-13 — End: 2017-04-19
  Administered 2017-04-13 – 2017-04-19 (×10): 12.5 mg via ORAL

## 2017-04-13 MED ORDER — LEVETIRACETAM 500 MG PO TAB
500 mg | Freq: Two times a day (BID) | ORAL | 0 refills | Status: DC
Start: 2017-04-13 — End: 2017-04-19
  Administered 2017-04-14 – 2017-04-19 (×12): 500 mg via ORAL

## 2017-04-13 MED ORDER — SENNOSIDES-DOCUSATE SODIUM 8.6-50 MG PO TAB
1 | Freq: Two times a day (BID) | ORAL | 0 refills | Status: DC
Start: 2017-04-13 — End: 2017-04-19
  Administered 2017-04-14 – 2017-04-15 (×2): 1 via ORAL

## 2017-04-13 MED ORDER — MORPHINE 4 MG/ML IV CRTG
4-6 mg | INTRAVENOUS | 0 refills | Status: DC | PRN
Start: 2017-04-13 — End: 2017-04-13
  Administered 2017-04-13: 14:00:00 4 mg via INTRAVENOUS

## 2017-04-13 MED ORDER — HEPARIN, PORCINE (PF) 5,000 UNIT/0.5 ML IJ SYRG
5000 [IU] | SUBCUTANEOUS | 0 refills | Status: DC
Start: 2017-04-13 — End: 2017-04-15
  Administered 2017-04-13 – 2017-04-15 (×5): 5000 [IU] via SUBCUTANEOUS

## 2017-04-13 MED ORDER — ERTAPENEM 1GM IVP
1 g | Freq: Once | INTRAVENOUS | 0 refills | Status: CP
Start: 2017-04-13 — End: ?
  Administered 2017-04-13: 16:00:00 1 g via INTRAVENOUS

## 2017-04-13 MED ORDER — ACETAMINOPHEN 325 MG PO TAB
650 mg | Freq: Once | ORAL | 0 refills | Status: CP
Start: 2017-04-13 — End: ?
  Administered 2017-04-13: 22:00:00 650 mg via ORAL

## 2017-04-13 MED ORDER — POLYETHYLENE GLYCOL 3350 17 GRAM PO PWPK
1 | Freq: Two times a day (BID) | ORAL | 0 refills | Status: DC
Start: 2017-04-13 — End: 2017-04-19
  Administered 2017-04-14: 14:00:00 17 g via ORAL

## 2017-04-13 MED ORDER — OXYCODONE 5 MG PO TAB
5 mg | ORAL | 0 refills | Status: DC | PRN
Start: 2017-04-13 — End: 2017-04-15
  Administered 2017-04-14 – 2017-04-15 (×5): 5 mg via ORAL

## 2017-04-13 MED ORDER — SODIUM CHLORIDE 0.9 % IV SOLP
1000 mL | INTRAVENOUS | 0 refills | Status: CP
Start: 2017-04-13 — End: ?
  Administered 2017-04-13: 22:00:00 1000 mL via INTRAVENOUS

## 2017-04-13 MED ORDER — SODIUM CHLORIDE 0.9 % IV SOLP
1000 mL | Freq: Once | INTRAVENOUS | 0 refills | Status: CP
Start: 2017-04-13 — End: ?
  Administered 2017-04-13: 1000 mL via INTRAVENOUS

## 2017-04-13 MED ORDER — ACETAMINOPHEN 500 MG PO TAB
1000 mg | Freq: Once | ORAL | 0 refills | Status: CP
Start: 2017-04-13 — End: ?
  Administered 2017-04-13: 14:00:00 1000 mg via ORAL

## 2017-04-14 ENCOUNTER — Encounter: Admit: 2017-04-14 | Discharge: 2017-04-14 | Payer: MEDICARE

## 2017-04-14 DIAGNOSIS — G4733 Obstructive sleep apnea (adult) (pediatric): ICD-10-CM

## 2017-04-14 DIAGNOSIS — G919 Hydrocephalus, unspecified: ICD-10-CM

## 2017-04-14 DIAGNOSIS — F988 Other specified behavioral and emotional disorders with onset usually occurring in childhood and adolescence: ICD-10-CM

## 2017-04-14 DIAGNOSIS — R569 Unspecified convulsions: ICD-10-CM

## 2017-04-14 DIAGNOSIS — N200 Calculus of kidney: ICD-10-CM

## 2017-04-14 DIAGNOSIS — T85618A Breakdown (mechanical) of other specified internal prosthetic devices, implants and grafts, initial encounter: ICD-10-CM

## 2017-04-14 DIAGNOSIS — Q0700 Arnold-Chiari syndrome without spina bifida or hydrocephalus: ICD-10-CM

## 2017-04-14 DIAGNOSIS — N319 Neuromuscular dysfunction of bladder, unspecified: ICD-10-CM

## 2017-04-14 DIAGNOSIS — N390 Urinary tract infection, site not specified: ICD-10-CM

## 2017-04-14 DIAGNOSIS — Q059 Spina bifida, unspecified: Secondary | ICD-10-CM

## 2017-04-14 DIAGNOSIS — R51 Headache: ICD-10-CM

## 2017-04-14 LAB — BASIC METABOLIC PANEL
Lab: 1.4 mg/dL — ABNORMAL HIGH (ref 0.4–1.24)
Lab: 111 MMOL/L — ABNORMAL HIGH (ref 98–110)
Lab: 137 MMOL/L (ref 137–147)
Lab: 21 MMOL/L (ref 21–30)
Lab: 5 (ref 3–12)
Lab: 5.3 MMOL/L — ABNORMAL HIGH (ref 3.5–5.1)
Lab: 57 mL/min — ABNORMAL LOW (ref >60)
Lab: 8.7 mg/dL (ref 8.5–10.6)
Lab: 81 mg/dL (ref 70–100)
Lab: >60 mL/min (ref >60)
Lab: 1.3 mg/dL — ABNORMAL HIGH (ref 0.4–1.24)
Lab: 104 mg/dL — ABNORMAL HIGH (ref 70–100)
Lab: 110 MMOL/L (ref 98–110)
Lab: 137 MMOL/L (ref 137–147)
Lab: 20 MMOL/L — ABNORMAL LOW (ref 21–30)
Lab: 47 mg/dL — ABNORMAL HIGH (ref 7–25)
Lab: 5 MMOL/L (ref 3.5–5.1)
Lab: 7 (ref 3–12)
Lab: 9.3 mg/dL (ref 8.5–10.6)
Lab: >60 mL/min (ref >60)
Lab: >60 mL/min (ref >60)

## 2017-04-14 LAB — CBC AND DIFF: Lab: 6.5 10*3/uL — ABNORMAL LOW (ref 4.5–11.0)

## 2017-04-14 LAB — POC GLUCOSE: Lab: 142 mg/dL — ABNORMAL HIGH (ref 70–100)

## 2017-04-14 LAB — COMPREHENSIVE METABOLIC PANEL: Lab: 139 MMOL/L — ABNORMAL LOW (ref 137–147)

## 2017-04-14 MED ORDER — SODIUM BICARBONATE 1 MEQ/ML (8.4 %) IV SOLN
50 meq | Freq: Once | INTRAVENOUS | 0 refills | Status: DC
Start: 2017-04-14 — End: 2017-04-14

## 2017-04-14 MED ORDER — INSULIN REGULAR HUMAN(#) 1 UNIT/ML IJ SYRINGE
10 [IU] | Freq: Once | INTRAVENOUS | 0 refills | Status: CP
Start: 2017-04-14 — End: ?
  Administered 2017-04-14: 14:00:00 10 [IU] via INTRAVENOUS

## 2017-04-14 MED ORDER — SODIUM CHLORIDE 0.9 % IV SOLP
2000 mL | Freq: Once | INTRAVENOUS | 0 refills | Status: CP
Start: 2017-04-14 — End: ?
  Administered 2017-04-14: 22:00:00 2000 mL via INTRAVENOUS

## 2017-04-14 MED ORDER — DEXTROSE 50 % IN WATER (D50W) IV SOLP
25 g | Freq: Once | INTRAVENOUS | 0 refills | Status: CP
Start: 2017-04-14 — End: ?
  Administered 2017-04-14: 14:00:00 50 mL via INTRAVENOUS

## 2017-04-14 MED ORDER — SODIUM CHLORIDE 0.9 % IV SOLP
1000 mL | Freq: Once | INTRAVENOUS | 0 refills | Status: CP
Start: 2017-04-14 — End: ?
  Administered 2017-04-14: 14:00:00 1000 mL via INTRAVENOUS

## 2017-04-14 MED ORDER — FUROSEMIDE 10 MG/ML IJ SOLN
20 mg | Freq: Once | INTRAVENOUS | 0 refills | Status: DC
Start: 2017-04-14 — End: 2017-04-14

## 2017-04-14 MED ORDER — SODIUM POLYSTYRENE SULFONATE 15 GRAM/60 ML PO SUSP
30 g | Freq: Once | ORAL | 0 refills | Status: CP
Start: 2017-04-14 — End: ?
  Administered 2017-04-14: 14:00:00 30 g via ORAL

## 2017-04-15 ENCOUNTER — Encounter: Admit: 2017-04-15 | Discharge: 2017-04-15 | Payer: MEDICARE

## 2017-04-15 DIAGNOSIS — Q0700 Arnold-Chiari syndrome without spina bifida or hydrocephalus: ICD-10-CM

## 2017-04-15 DIAGNOSIS — G919 Hydrocephalus, unspecified: ICD-10-CM

## 2017-04-15 DIAGNOSIS — Q059 Spina bifida, unspecified: Principal | ICD-10-CM

## 2017-04-15 DIAGNOSIS — R569 Unspecified convulsions: ICD-10-CM

## 2017-04-15 DIAGNOSIS — N200 Calculus of kidney: ICD-10-CM

## 2017-04-15 DIAGNOSIS — F988 Other specified behavioral and emotional disorders with onset usually occurring in childhood and adolescence: ICD-10-CM

## 2017-04-15 DIAGNOSIS — N319 Neuromuscular dysfunction of bladder, unspecified: ICD-10-CM

## 2017-04-15 DIAGNOSIS — G4733 Obstructive sleep apnea (adult) (pediatric): ICD-10-CM

## 2017-04-15 DIAGNOSIS — T85618A Breakdown (mechanical) of other specified internal prosthetic devices, implants and grafts, initial encounter: ICD-10-CM

## 2017-04-15 DIAGNOSIS — N390 Urinary tract infection, site not specified: ICD-10-CM

## 2017-04-15 DIAGNOSIS — R51 Headache: ICD-10-CM

## 2017-04-15 LAB — SODIUM-URINE RANDOM: Lab: 117 MMOL/L

## 2017-04-15 LAB — BASIC METABOLIC PANEL
Lab: 0.8 mg/dL (ref 0.4–1.24)
Lab: 109 MMOL/L (ref 98–110)
Lab: 136 MMOL/L — ABNORMAL LOW (ref 137–147)
Lab: 20 MMOL/L — ABNORMAL LOW (ref 21–30)
Lab: 21 mg/dL (ref >60)
Lab: 7 mg/dL — ABNORMAL LOW (ref 3–12)
Lab: 8.4 mg/dL — ABNORMAL LOW (ref 8.5–10.6)
Lab: 91 mg/dL (ref >60)
Lab: >60 mL/min (ref >60)
Lab: >60 mL/min (ref >60)

## 2017-04-15 LAB — OSMOLALITY: Lab: 303 mosm/kg (ref 280–307)

## 2017-04-15 LAB — POTASSIUM-URINE RANDOM: Lab: 14 MMOL/L

## 2017-04-15 LAB — OSMOLALITY-URINE RANDOM: Lab: 397 mosm/kg (ref 50–1400)

## 2017-04-15 LAB — COMPREHENSIVE METABOLIC PANEL: Lab: 138 MMOL/L — ABNORMAL LOW (ref 137–147)

## 2017-04-15 LAB — CBC AND DIFF: Lab: 5.1 K/UL — ABNORMAL HIGH (ref 4.5–11.0)

## 2017-04-15 LAB — CHLORIDE-URINE RANDOM: Lab: 114 MMOL/L

## 2017-04-15 MED ORDER — DIPHENHYDRAMINE HCL 25 MG PO CAP
25 mg | ORAL | 0 refills | Status: DC
Start: 2017-04-15 — End: 2017-04-17
  Administered 2017-04-16 – 2017-04-17 (×5): 25 mg via ORAL

## 2017-04-15 MED ORDER — MAGNESIUM SULFATE IN D5W 1 GRAM/100 ML IV PGBK
1 g | INTRAVENOUS | 0 refills | Status: AC
Start: 2017-04-15 — End: ?
  Administered 2017-04-16 (×3): 1 g via INTRAVENOUS

## 2017-04-15 MED ORDER — SODIUM CHLORIDE 0.9 % IV SOLP
1000 mL | Freq: Once | INTRAVENOUS | 0 refills | Status: CP
Start: 2017-04-15 — End: ?
  Administered 2017-04-15: 14:00:00 1000 mL via INTRAVENOUS

## 2017-04-15 MED ORDER — MAGNESIUM SULFATE IN D5W 1 GRAM/100 ML IV PGBK
1 g | Freq: Once | INTRAVENOUS | 0 refills | Status: CP
Start: 2017-04-15 — End: ?
  Administered 2017-04-15: 19:00:00 1 g via INTRAVENOUS

## 2017-04-15 MED ORDER — PROCHLORPERAZINE EDISYLATE 5 MG/ML IJ SOLN
10 mg | Freq: Once | INTRAVENOUS | 0 refills | Status: CP
Start: 2017-04-15 — End: ?
  Administered 2017-04-15: 19:00:00 10 mg via INTRAVENOUS

## 2017-04-15 MED ORDER — PROCHLORPERAZINE EDISYLATE 5 MG/ML IJ SOLN
10 mg | INTRAVENOUS | 0 refills | Status: DC
Start: 2017-04-15 — End: 2017-04-17
  Administered 2017-04-16 – 2017-04-17 (×5): 10 mg via INTRAVENOUS

## 2017-04-15 MED ORDER — DIPHENHYDRAMINE HCL 50 MG/ML IJ SOLN
25 mg | Freq: Once | INTRAVENOUS | 0 refills | Status: CP
Start: 2017-04-15 — End: ?
  Administered 2017-04-15: 19:00:00 25 mg via INTRAVENOUS

## 2017-04-15 MED ORDER — ACETAMINOPHEN 325 MG PO TAB
650 mg | ORAL | 0 refills | Status: DC | PRN
Start: 2017-04-15 — End: 2017-04-19
  Administered 2017-04-17: 21:00:00 650 mg via ORAL

## 2017-04-16 LAB — COMPREHENSIVE METABOLIC PANEL
Lab: 138 MMOL/L — ABNORMAL LOW (ref 137–147)
Lab: 14 mg/dL — ABNORMAL HIGH (ref >60)
Lab: 5.5 MMOL/L — ABNORMAL HIGH (ref 3.5–5.1)

## 2017-04-16 LAB — 25-OH VITAMIN D (D2 + D3): Lab: 24 ng/mL — ABNORMAL LOW (ref 30–80)

## 2017-04-16 LAB — CULTURE-URINE W/SENSITIVITY

## 2017-04-16 LAB — CBC AND DIFF: Lab: 5.4 K/UL — ABNORMAL HIGH (ref >60)

## 2017-04-16 LAB — MAGNESIUM: Lab: 2 mg/dL — ABNORMAL LOW (ref 1.6–2.6)

## 2017-04-16 LAB — VITAMIN B12: Lab: 350 pg/mL (ref >60)

## 2017-04-16 MED ORDER — SODIUM CHLORIDE 0.9 % IV SOLP
1000 mL | INTRAVENOUS | 0 refills | Status: DC
Start: 2017-04-16 — End: 2017-04-16

## 2017-04-16 MED ORDER — FUROSEMIDE 10 MG/ML IJ SOLN
40 mg | Freq: Once | INTRAVENOUS | 0 refills | Status: CP
Start: 2017-04-16 — End: ?
  Administered 2017-04-16: 15:00:00 40 mg via INTRAVENOUS

## 2017-04-17 LAB — COMPREHENSIVE METABOLIC PANEL
Lab: 137 MMOL/L — ABNORMAL LOW (ref 137–147)
Lab: 4.3 MMOL/L — ABNORMAL HIGH (ref 3.5–5.1)

## 2017-04-17 LAB — MAGNESIUM: Lab: 2 mg/dL (ref 1.6–2.6)

## 2017-04-17 LAB — CBC AND DIFF: Lab: 7.9 K/UL — ABNORMAL LOW (ref >60)

## 2017-04-17 MED ORDER — MAGNESIUM SULFATE IN D5W 1 GRAM/100 ML IV PGBK
1 g | INTRAVENOUS | 0 refills | Status: CP | PRN
Start: 2017-04-17 — End: ?
  Administered 2017-04-17 (×2): 1 g via INTRAVENOUS

## 2017-04-17 MED ORDER — MAGNESIUM SULFATE IN D5W 1 GRAM/100 ML IV PGBK
1 g | INTRAVENOUS | 0 refills | Status: DC | PRN
Start: 2017-04-17 — End: 2017-04-17

## 2017-04-17 MED ORDER — DIPHENHYDRAMINE HCL 25 MG PO CAP
25 mg | ORAL | 0 refills | Status: DC | PRN
Start: 2017-04-17 — End: 2017-04-19
  Administered 2017-04-17 (×2): 25 mg via ORAL

## 2017-04-17 MED ORDER — PROCHLORPERAZINE EDISYLATE 5 MG/ML IJ SOLN
10 mg | INTRAVENOUS | 0 refills | Status: DC | PRN
Start: 2017-04-17 — End: 2017-04-19
  Administered 2017-04-17 (×2): 10 mg via INTRAVENOUS

## 2017-04-17 MED ORDER — PROCHLORPERAZINE EDISYLATE 5 MG/ML IJ SOLN
10 mg | INTRAVENOUS | 0 refills | Status: DC | PRN
Start: 2017-04-17 — End: 2017-04-17

## 2017-04-17 MED ORDER — DIPHENHYDRAMINE HCL 25 MG PO CAP
25 mg | ORAL | 0 refills | Status: DC | PRN
Start: 2017-04-17 — End: 2017-04-17

## 2017-04-18 LAB — MAGNESIUM: Lab: 1.9 mg/dL — ABNORMAL LOW (ref 1.6–2.6)

## 2017-04-18 LAB — CBC AND DIFF: Lab: 7.2 K/UL — ABNORMAL LOW (ref 4.5–11.0)

## 2017-04-18 LAB — COMPREHENSIVE METABOLIC PANEL: Lab: 137 MMOL/L — ABNORMAL LOW (ref >60)

## 2017-04-18 MED ORDER — VALPROIC ACID IVPB
250 mg | INTRAVENOUS | 0 refills | Status: DC | PRN
Start: 2017-04-18 — End: 2017-04-19

## 2017-04-18 MED ORDER — DIPHENHYDRAMINE HCL 50 MG/ML IJ SOLN
25 mg | INTRAVENOUS | 0 refills | Status: DC | PRN
Start: 2017-04-18 — End: 2017-04-19

## 2017-04-18 MED ORDER — KETOROLAC 15 MG/ML IJ SOLN
15 mg | INTRAVENOUS | 0 refills | Status: DC | PRN
Start: 2017-04-18 — End: 2017-04-19

## 2017-04-18 MED ORDER — PROCHLORPERAZINE EDISYLATE 5 MG/ML IJ SOLN
10 mg | INTRAVENOUS | 0 refills | Status: DC | PRN
Start: 2017-04-18 — End: 2017-04-19

## 2017-04-19 ENCOUNTER — Emergency Department: Admit: 2017-04-13 | Discharge: 2017-04-13 | Payer: MEDICARE

## 2017-04-19 ENCOUNTER — Encounter: Admit: 2017-04-19 | Discharge: 2017-04-19 | Payer: MEDICARE

## 2017-04-19 ENCOUNTER — Ambulatory Visit: Admit: 2017-04-13 | Discharge: 2017-04-14 | Payer: MEDICARE

## 2017-04-19 DIAGNOSIS — K632 Fistula of intestine: ICD-10-CM

## 2017-04-19 DIAGNOSIS — N319 Neuromuscular dysfunction of bladder, unspecified: ICD-10-CM

## 2017-04-19 DIAGNOSIS — Q0701 Arnold-Chiari syndrome with spina bifida: ICD-10-CM

## 2017-04-19 DIAGNOSIS — N136 Pyonephrosis: Principal | ICD-10-CM

## 2017-04-19 DIAGNOSIS — E875 Hyperkalemia: ICD-10-CM

## 2017-04-19 DIAGNOSIS — Z936 Other artificial openings of urinary tract status: ICD-10-CM

## 2017-04-19 DIAGNOSIS — G43009 Migraine without aura, not intractable, without status migrainosus: ICD-10-CM

## 2017-04-19 DIAGNOSIS — F988 Other specified behavioral and emotional disorders with onset usually occurring in childhood and adolescence: ICD-10-CM

## 2017-04-19 DIAGNOSIS — E559 Vitamin D deficiency, unspecified: ICD-10-CM

## 2017-04-19 DIAGNOSIS — I5032 Chronic diastolic (congestive) heart failure: ICD-10-CM

## 2017-04-19 DIAGNOSIS — G4733 Obstructive sleep apnea (adult) (pediatric): ICD-10-CM

## 2017-04-19 DIAGNOSIS — E872 Acidosis: ICD-10-CM

## 2017-04-19 DIAGNOSIS — I429 Cardiomyopathy, unspecified: ICD-10-CM

## 2017-04-19 DIAGNOSIS — E538 Deficiency of other specified B group vitamins: ICD-10-CM

## 2017-04-19 DIAGNOSIS — N179 Acute kidney failure, unspecified: ICD-10-CM

## 2017-04-19 DIAGNOSIS — Z906 Acquired absence of other parts of urinary tract: ICD-10-CM

## 2017-04-19 DIAGNOSIS — Z982 Presence of cerebrospinal fluid drainage device: ICD-10-CM

## 2017-04-19 LAB — RENIN-RANDOM: Lab: 88 pg/mL — ABNORMAL HIGH (ref 3–45)

## 2017-04-19 LAB — COMPREHENSIVE METABOLIC PANEL
Lab: 1 mg/dL — ABNORMAL LOW (ref 0.4–1.24)
Lab: 137 MMOL/L — ABNORMAL LOW (ref 137–147)
Lab: 21 mg/dL (ref 7–25)
Lab: 4.4 MMOL/L — ABNORMAL LOW (ref >60)
Lab: 87 mg/dL (ref 70–100)

## 2017-04-19 LAB — CULTURE-BLOOD W/SENSITIVITY

## 2017-04-19 LAB — CBC AND DIFF: Lab: 7.5 K/UL — ABNORMAL LOW (ref 4.5–11.0)

## 2017-04-19 LAB — MAGNESIUM: Lab: 1.9 mg/dL — ABNORMAL LOW (ref >60)

## 2017-04-19 LAB — ALDOSTERONE-RANDOM: Lab: 4 ng/dL (ref 3–24)

## 2017-04-19 LAB — SERUM FREE CORTISOL: Lab: 0.2

## 2017-04-19 MED ORDER — ACETAMINOPHEN 325 MG PO TAB
650 mg | ORAL | 0 refills | Status: AC | PRN
Start: 2017-04-19 — End: 2017-05-09

## 2017-04-19 MED ORDER — CYANOCOBALAMIN (VITAMIN B-12) 1,000 MCG PO TAB
1000 ug | ORAL_TABLET | Freq: Every day | ORAL | 0 refills | 29.00000 days | Status: AC
Start: 2017-04-19 — End: ?

## 2017-04-19 MED ORDER — CHOLECALCIFEROL (VITAMIN D3) 2,000 UNIT PO TAB
2000 [IU] | ORAL_TABLET | Freq: Every day | ORAL | 0 refills | 84.00000 days | Status: AC
Start: 2017-04-19 — End: ?

## 2017-04-20 ENCOUNTER — Encounter: Admit: 2017-04-20 | Discharge: 2017-04-20 | Payer: MEDICARE

## 2017-04-21 ENCOUNTER — Encounter: Admit: 2017-04-21 | Discharge: 2017-04-21 | Payer: MEDICARE

## 2017-04-27 ENCOUNTER — Emergency Department
Admit: 2017-04-27 | Discharge: 2017-04-27 | Payer: MEDICARE | Attending: Student in an Organized Health Care Education/Training Program

## 2017-04-27 ENCOUNTER — Encounter: Admit: 2017-04-27 | Discharge: 2017-04-27 | Payer: MEDICARE

## 2017-04-27 ENCOUNTER — Emergency Department
Admit: 2017-04-27 | Discharge: 2017-04-27 | Disposition: A | Discharge status: Home / Self Care | Payer: MEDICARE | Attending: Student in an Organized Health Care Education/Training Program

## 2017-04-27 DIAGNOSIS — Z906 Acquired absence of other parts of urinary tract: ICD-10-CM

## 2017-04-27 DIAGNOSIS — Z87442 Personal history of urinary calculi: ICD-10-CM

## 2017-04-27 DIAGNOSIS — H538 Other visual disturbances: ICD-10-CM

## 2017-04-27 DIAGNOSIS — R51 Headache: Principal | ICD-10-CM

## 2017-04-27 DIAGNOSIS — Z982 Presence of cerebrospinal fluid drainage device: ICD-10-CM

## 2017-04-27 DIAGNOSIS — R1013 Epigastric pain: ICD-10-CM

## 2017-04-27 DIAGNOSIS — R05 Cough: ICD-10-CM

## 2017-04-27 DIAGNOSIS — N390 Urinary tract infection, site not specified: ICD-10-CM

## 2017-04-27 LAB — COMPREHENSIVE METABOLIC PANEL
Lab: 141 MMOL/L — ABNORMAL LOW (ref 137–147)
Lab: 8 K/UL (ref 3–12)
Lab: >60 mL/min (ref >60)
Lab: >60 mL/min (ref >60)

## 2017-04-27 LAB — CBC AND DIFF
Lab: 0 10*3/uL (ref 0–0.20)
Lab: 0.2 10*3/uL (ref 0–0.45)
Lab: 7.6 10*3/uL (ref 4.5–11.0)

## 2017-04-27 LAB — URINALYSIS DIPSTICK
Lab: NEGATIVE U/L (ref 7–56)
Lab: NEGATIVE g/dL (ref 6.0–8.0)
Lab: NEGATIVE mg/dL — ABNORMAL LOW (ref 0.3–1.2)
Lab: POSITIVE U/L — AB (ref 7–40)

## 2017-04-27 LAB — URINALYSIS, MICROSCOPIC

## 2017-04-27 LAB — LIPASE: Lab: 28 U/L (ref 11–82)

## 2017-04-27 LAB — POC TROPONIN: Lab: 0 ng/mL (ref 0.00–0.05)

## 2017-04-27 MED ORDER — CEFPODOXIME 100 MG PO TAB
100 mg | Freq: Once | ORAL | 0 refills | Status: CP
Start: 2017-04-27 — End: ?
  Administered 2017-04-27: 18:00:00 100 mg via ORAL

## 2017-04-27 MED ORDER — DIPHENHYDRAMINE HCL 50 MG/ML IJ SOLN
25 mg | Freq: Once | INTRAVENOUS | 0 refills | Status: CP
Start: 2017-04-27 — End: ?
  Administered 2017-04-27: 16:00:00 25 mg via INTRAVENOUS

## 2017-04-27 MED ORDER — CEFPODOXIME 100 MG PO TAB
100 mg | ORAL_TABLET | Freq: Two times a day (BID) | ORAL | 0 refills | 7.00000 days | Status: AC
Start: 2017-04-27 — End: ?

## 2017-04-27 MED ORDER — SODIUM CHLORIDE 0.9 % IJ SOLN
50 mL | Freq: Once | INTRAVENOUS | 0 refills | Status: CP
Start: 2017-04-27 — End: ?
  Administered 2017-04-27: 17:00:00 50 mL via INTRAVENOUS

## 2017-04-27 MED ORDER — IOHEXOL 350 MG IODINE/ML IV SOLN
80 mL | Freq: Once | INTRAVENOUS | 0 refills | Status: CP
Start: 2017-04-27 — End: ?
  Administered 2017-04-27: 17:00:00 80 mL via INTRAVENOUS

## 2017-04-27 MED ORDER — KETOROLAC 30 MG/ML (1 ML) IJ SOLN
30 mg | Freq: Once | INTRAVENOUS | 0 refills | Status: CP
Start: 2017-04-27 — End: ?
  Administered 2017-04-27: 16:00:00 30 mg via INTRAVENOUS

## 2017-04-27 MED ORDER — LACTATED RINGERS IV SOLP
1000 mL | INTRAVENOUS | 0 refills | Status: CP
Start: 2017-04-27 — End: ?
  Administered 2017-04-27: 16:00:00 1000 mL via INTRAVENOUS

## 2017-04-28 ENCOUNTER — Encounter: Admit: 2017-04-28 | Discharge: 2017-04-28 | Payer: MEDICARE

## 2017-04-28 LAB — CULTURE-URINE W/SENSITIVITY: Lab: 3 10*3/uL (ref 1.8–7.0)

## 2017-04-30 ENCOUNTER — Encounter: Admit: 2017-04-30 | Discharge: 2017-04-30 | Payer: MEDICARE

## 2017-05-02 MED ORDER — IMIPRAMINE HCL 25 MG PO TAB
ORAL_TABLET | ORAL | 0 refills | Status: AC
Start: 2017-05-02 — End: 2017-05-26

## 2017-05-04 ENCOUNTER — Encounter: Admit: 2017-05-04 | Discharge: 2017-05-04 | Payer: MEDICARE

## 2017-05-04 ENCOUNTER — Ambulatory Visit: Admit: 2017-05-04 | Discharge: 2017-05-04 | Payer: MEDICARE

## 2017-05-04 ENCOUNTER — Ambulatory Visit: Admit: 2017-05-04 | Discharge: 2017-05-05 | Payer: MEDICARE

## 2017-05-04 DIAGNOSIS — G919 Hydrocephalus, unspecified: ICD-10-CM

## 2017-05-04 DIAGNOSIS — T85618A Breakdown (mechanical) of other specified internal prosthetic devices, implants and grafts, initial encounter: ICD-10-CM

## 2017-05-04 DIAGNOSIS — R51 Headache: ICD-10-CM

## 2017-05-04 DIAGNOSIS — F988 Other specified behavioral and emotional disorders with onset usually occurring in childhood and adolescence: ICD-10-CM

## 2017-05-04 DIAGNOSIS — Q054 Unspecified spina bifida with hydrocephalus: Principal | ICD-10-CM

## 2017-05-04 DIAGNOSIS — Q0700 Arnold-Chiari syndrome without spina bifida or hydrocephalus: ICD-10-CM

## 2017-05-04 DIAGNOSIS — R569 Unspecified convulsions: ICD-10-CM

## 2017-05-04 DIAGNOSIS — N200 Calculus of kidney: ICD-10-CM

## 2017-05-04 DIAGNOSIS — Q059 Spina bifida, unspecified: Secondary | ICD-10-CM

## 2017-05-04 DIAGNOSIS — N319 Neuromuscular dysfunction of bladder, unspecified: ICD-10-CM

## 2017-05-04 DIAGNOSIS — N390 Urinary tract infection, site not specified: ICD-10-CM

## 2017-05-04 DIAGNOSIS — Z982 Presence of cerebrospinal fluid drainage device: ICD-10-CM

## 2017-05-04 DIAGNOSIS — G4733 Obstructive sleep apnea (adult) (pediatric): ICD-10-CM

## 2017-05-05 ENCOUNTER — Encounter: Admit: 2017-05-05 | Discharge: 2017-05-05 | Payer: MEDICARE

## 2017-05-05 ENCOUNTER — Ambulatory Visit: Admit: 2017-05-05 | Discharge: 2017-05-05 | Payer: MEDICARE

## 2017-05-05 DIAGNOSIS — N200 Calculus of kidney: Principal | ICD-10-CM

## 2017-05-05 DIAGNOSIS — N390 Urinary tract infection, site not specified: ICD-10-CM

## 2017-05-05 DIAGNOSIS — G919 Hydrocephalus, unspecified: ICD-10-CM

## 2017-05-05 DIAGNOSIS — T85618A Breakdown (mechanical) of other specified internal prosthetic devices, implants and grafts, initial encounter: ICD-10-CM

## 2017-05-05 DIAGNOSIS — Q0700 Arnold-Chiari syndrome without spina bifida or hydrocephalus: ICD-10-CM

## 2017-05-05 DIAGNOSIS — R51 Headache: ICD-10-CM

## 2017-05-05 DIAGNOSIS — G4733 Obstructive sleep apnea (adult) (pediatric): ICD-10-CM

## 2017-05-05 DIAGNOSIS — F988 Other specified behavioral and emotional disorders with onset usually occurring in childhood and adolescence: ICD-10-CM

## 2017-05-05 DIAGNOSIS — N319 Neuromuscular dysfunction of bladder, unspecified: ICD-10-CM

## 2017-05-05 DIAGNOSIS — R569 Unspecified convulsions: ICD-10-CM

## 2017-05-05 DIAGNOSIS — Q059 Spina bifida, unspecified: ICD-10-CM

## 2017-05-05 MED ORDER — DIPHENHYDRAMINE HCL 50 MG PO CAP
50 mg | ORAL_CAPSULE | Freq: Once | ORAL | 0 refills | 6.00000 days | Status: AC
Start: 2017-05-05 — End: ?

## 2017-05-05 MED ORDER — SULFAMETHOXAZOLE-TRIMETHOPRIM 800-160 MG PO TAB
1 | ORAL_TABLET | Freq: Two times a day (BID) | ORAL | 0 refills | Status: AC
Start: 2017-05-05 — End: ?

## 2017-05-05 MED ORDER — LEVOFLOXACIN IN D5W 500 MG/100 ML IV PGBK
500 mg | Freq: Once | INTRAVENOUS | 0 refills | Status: CN
Start: 2017-05-05 — End: ?

## 2017-05-06 ENCOUNTER — Encounter: Admit: 2017-05-06 | Discharge: 2017-05-06 | Payer: MEDICARE

## 2017-05-06 LAB — CULTURE-URINE W/SENSITIVITY: Lab: <10

## 2017-05-07 ENCOUNTER — Encounter: Admit: 2017-05-07 | Discharge: 2017-05-07 | Payer: MEDICARE

## 2017-05-07 DIAGNOSIS — N390 Urinary tract infection, site not specified: ICD-10-CM

## 2017-05-07 DIAGNOSIS — N319 Neuromuscular dysfunction of bladder, unspecified: ICD-10-CM

## 2017-05-07 DIAGNOSIS — F988 Other specified behavioral and emotional disorders with onset usually occurring in childhood and adolescence: ICD-10-CM

## 2017-05-07 DIAGNOSIS — T85618A Breakdown (mechanical) of other specified internal prosthetic devices, implants and grafts, initial encounter: ICD-10-CM

## 2017-05-07 DIAGNOSIS — R51 Headache: ICD-10-CM

## 2017-05-07 DIAGNOSIS — N200 Calculus of kidney: ICD-10-CM

## 2017-05-07 DIAGNOSIS — G4733 Obstructive sleep apnea (adult) (pediatric): ICD-10-CM

## 2017-05-07 DIAGNOSIS — G919 Hydrocephalus, unspecified: ICD-10-CM

## 2017-05-07 DIAGNOSIS — R569 Unspecified convulsions: ICD-10-CM

## 2017-05-07 DIAGNOSIS — Q0700 Arnold-Chiari syndrome without spina bifida or hydrocephalus: ICD-10-CM

## 2017-05-07 DIAGNOSIS — Q059 Spina bifida, unspecified: Principal | ICD-10-CM

## 2017-05-09 ENCOUNTER — Ambulatory Visit: Admit: 2017-05-09 | Discharge: 2017-05-10 | Payer: MEDICARE

## 2017-05-09 ENCOUNTER — Encounter: Admit: 2017-05-09 | Discharge: 2017-05-09 | Payer: MEDICARE

## 2017-05-09 DIAGNOSIS — Q059 Spina bifida, unspecified: ICD-10-CM

## 2017-05-09 DIAGNOSIS — N390 Urinary tract infection, site not specified: ICD-10-CM

## 2017-05-09 DIAGNOSIS — Q0700 Arnold-Chiari syndrome without spina bifida or hydrocephalus: ICD-10-CM

## 2017-05-09 DIAGNOSIS — G4733 Obstructive sleep apnea (adult) (pediatric): ICD-10-CM

## 2017-05-09 DIAGNOSIS — N200 Calculus of kidney: ICD-10-CM

## 2017-05-09 DIAGNOSIS — R569 Unspecified convulsions: ICD-10-CM

## 2017-05-09 DIAGNOSIS — T85618A Breakdown (mechanical) of other specified internal prosthetic devices, implants and grafts, initial encounter: ICD-10-CM

## 2017-05-09 DIAGNOSIS — R51 Headache: ICD-10-CM

## 2017-05-09 DIAGNOSIS — N319 Neuromuscular dysfunction of bladder, unspecified: ICD-10-CM

## 2017-05-09 DIAGNOSIS — F988 Other specified behavioral and emotional disorders with onset usually occurring in childhood and adolescence: ICD-10-CM

## 2017-05-09 DIAGNOSIS — G919 Hydrocephalus, unspecified: ICD-10-CM

## 2017-05-09 MED ORDER — LOSARTAN 25 MG PO TAB
25 mg | ORAL_TABLET | Freq: Every day | ORAL | 3 refills | 30.00000 days | Status: AC
Start: 2017-05-09 — End: 2017-08-16

## 2017-05-09 MED ORDER — SPIRONOLACTONE 25 MG PO TAB
25 mg | ORAL_TABLET | Freq: Every day | ORAL | 3 refills | 90.00000 days | Status: AC
Start: 2017-05-09 — End: 2017-08-16

## 2017-05-10 DIAGNOSIS — I428 Other cardiomyopathies: Principal | ICD-10-CM

## 2017-05-10 DIAGNOSIS — I10 Essential (primary) hypertension: ICD-10-CM

## 2017-05-13 ENCOUNTER — Ambulatory Visit: Admit: 2017-05-13 | Discharge: 2017-05-14 | Payer: MEDICARE

## 2017-05-13 ENCOUNTER — Encounter: Admit: 2017-05-13 | Discharge: 2017-05-13 | Payer: MEDICARE

## 2017-05-13 DIAGNOSIS — R51 Headache: ICD-10-CM

## 2017-05-13 DIAGNOSIS — Q059 Spina bifida, unspecified: Secondary | ICD-10-CM

## 2017-05-13 DIAGNOSIS — I10 Essential (primary) hypertension: ICD-10-CM

## 2017-05-13 DIAGNOSIS — N200 Calculus of kidney: ICD-10-CM

## 2017-05-13 DIAGNOSIS — Q0700 Arnold-Chiari syndrome without spina bifida or hydrocephalus: ICD-10-CM

## 2017-05-13 DIAGNOSIS — T85618A Breakdown (mechanical) of other specified internal prosthetic devices, implants and grafts, initial encounter: ICD-10-CM

## 2017-05-13 DIAGNOSIS — F988 Other specified behavioral and emotional disorders with onset usually occurring in childhood and adolescence: ICD-10-CM

## 2017-05-13 DIAGNOSIS — G4733 Obstructive sleep apnea (adult) (pediatric): ICD-10-CM

## 2017-05-13 DIAGNOSIS — N390 Urinary tract infection, site not specified: ICD-10-CM

## 2017-05-13 DIAGNOSIS — I429 Cardiomyopathy, unspecified: ICD-10-CM

## 2017-05-13 DIAGNOSIS — R569 Unspecified convulsions: ICD-10-CM

## 2017-05-13 DIAGNOSIS — N319 Neuromuscular dysfunction of bladder, unspecified: ICD-10-CM

## 2017-05-13 DIAGNOSIS — G919 Hydrocephalus, unspecified: ICD-10-CM

## 2017-05-18 ENCOUNTER — Encounter: Admit: 2017-05-18 | Discharge: 2017-05-18 | Payer: MEDICARE

## 2017-05-18 DIAGNOSIS — R51 Headache: Principal | ICD-10-CM

## 2017-05-18 DIAGNOSIS — T85618A Breakdown (mechanical) of other specified internal prosthetic devices, implants and grafts, initial encounter: ICD-10-CM

## 2017-05-18 DIAGNOSIS — Q054 Unspecified spina bifida with hydrocephalus: ICD-10-CM

## 2017-05-20 ENCOUNTER — Encounter: Admit: 2017-05-20 | Discharge: 2017-05-20 | Payer: MEDICARE

## 2017-05-24 DIAGNOSIS — N200 Calculus of kidney: Principal | ICD-10-CM

## 2017-05-24 MED ORDER — GENTAMICIN PHARMACY TO MANAGE
1 | 0 refills | Status: DC
Start: 2017-05-24 — End: 2017-05-25

## 2017-05-24 MED ORDER — IOPAMIDOL 61 % IV SOLN
30 mL | Freq: Once | 0 refills | Status: CP
Start: 2017-05-24 — End: ?
  Administered 2017-05-24: 20:00:00 30 mL

## 2017-05-24 MED ORDER — SENNOSIDES-DOCUSATE SODIUM 8.6-50 MG PO TAB
1 | Freq: Two times a day (BID) | ORAL | 0 refills | Status: DC
Start: 2017-05-24 — End: 2017-05-26
  Administered 2017-05-25: 02:00:00 1 via ORAL

## 2017-05-24 MED ORDER — MIDAZOLAM 1 MG/ML IJ SOLN
1-2 mg | Freq: Once | INTRAVENOUS | 0 refills | Status: CP
Start: 2017-05-24 — End: ?
  Administered 2017-05-24: 19:00:00 2 mg via INTRAVENOUS

## 2017-05-24 MED ORDER — LEVOFLOXACIN IN D5W 500 MG/100 ML IV PGBK
500 mg | Freq: Once | INTRAVENOUS | 0 refills | Status: AC
Start: 2017-05-24 — End: ?

## 2017-05-24 MED ORDER — IMIPRAMINE HCL 50 MG PO TAB
50 mg | Freq: Every morning | ORAL | 0 refills | Status: DC
Start: 2017-05-24 — End: 2017-05-26
  Administered 2017-05-26: 14:00:00 50 mg via ORAL

## 2017-05-24 MED ORDER — GENTAMICIN PHARMACY TO MANAGE
1 | 0 refills | Status: DC
Start: 2017-05-24 — End: 2017-05-26

## 2017-05-24 MED ORDER — CARVEDILOL 12.5 MG PO TAB
12.5 mg | Freq: Two times a day (BID) | ORAL | 0 refills | Status: DC
Start: 2017-05-24 — End: 2017-05-26
  Administered 2017-05-24 – 2017-05-26 (×2): 12.5 mg via ORAL

## 2017-05-24 MED ORDER — LACTATED RINGERS IV SOLP
INTRAVENOUS | 0 refills | Status: DC
Start: 2017-05-24 — End: 2017-05-26
  Administered 2017-05-25 – 2017-05-26 (×2): 1000.000 mL via INTRAVENOUS

## 2017-05-24 MED ORDER — FENTANYL CITRATE (PF) 50 MCG/ML IJ SOLN
50 ug | Freq: Once | INTRAVENOUS | 0 refills | Status: CP
Start: 2017-05-24 — End: ?
  Administered 2017-05-24: 19:00:00 50 ug via INTRAVENOUS

## 2017-05-24 MED ORDER — VANCOMYCIN PHARMACY TO MANAGE
1 | 0 refills | Status: DC
Start: 2017-05-24 — End: 2017-05-26

## 2017-05-24 MED ORDER — ACETAMINOPHEN 325 MG PO TAB
650 mg | ORAL | 0 refills | Status: DC | PRN
Start: 2017-05-24 — End: 2017-05-26
  Administered 2017-05-25 (×2): 650 mg via ORAL

## 2017-05-24 MED ORDER — VANCOMYCIN 1,500 MG IVPB
1500 mg | Freq: Once | INTRAVENOUS | 0 refills | Status: CP
Start: 2017-05-24 — End: ?
  Administered 2017-05-24 (×2): 1500 mg via INTRAVENOUS

## 2017-05-24 MED ORDER — FENTANYL CITRATE (PF) 50 MCG/ML IJ SOLN
0 refills | Status: CP
Start: 2017-05-24 — End: ?
  Administered 2017-05-24 (×4): 50 ug via INTRAVENOUS

## 2017-05-24 MED ORDER — VANCOMYCIN PHARMACY TO MANAGE
1 | 0 refills | Status: DC
Start: 2017-05-24 — End: 2017-05-25

## 2017-05-24 MED ORDER — VANCOMYCIN IVPB
15 mg/kg | INTRAVENOUS | 0 refills | Status: DC
Start: 2017-05-24 — End: 2017-05-25

## 2017-05-24 MED ORDER — MIDAZOLAM 1 MG/ML IJ SOLN
0 refills | Status: CP
Start: 2017-05-24 — End: ?
  Administered 2017-05-24 (×2): 1 mg via INTRAVENOUS

## 2017-05-24 MED ORDER — POLYETHYLENE GLYCOL 3350 17 GRAM PO PWPK
1 | Freq: Every day | ORAL | 0 refills | Status: DC
Start: 2017-05-24 — End: 2017-05-26

## 2017-05-24 MED ORDER — GENTAMICIN IVPB (EXTENDED INTERVAL)
365 mg | INTRAVENOUS | 0 refills | Status: DC
Start: 2017-05-24 — End: 2017-05-26
  Administered 2017-05-25 – 2017-05-26 (×4): 365 mg via INTRAVENOUS

## 2017-05-24 MED ORDER — VANCOMYCIN 1,500 MG IVPB
1500 mg | Freq: Two times a day (BID) | INTRAVENOUS | 0 refills | Status: DC
Start: 2017-05-24 — End: 2017-05-26
  Administered 2017-05-25 – 2017-05-26 (×6): 1500 mg via INTRAVENOUS

## 2017-05-24 MED ORDER — ACETAMINOPHEN 500 MG PO TAB
1000 mg | Freq: Once | ORAL | 0 refills | Status: CN
Start: 2017-05-24 — End: ?

## 2017-05-24 MED ORDER — LEVETIRACETAM 500 MG PO TAB
500 mg | Freq: Two times a day (BID) | ORAL | 0 refills | Status: DC
Start: 2017-05-24 — End: 2017-05-26
  Administered 2017-05-25 – 2017-05-26 (×4): 500 mg via ORAL

## 2017-05-24 MED ORDER — VANCOMYCIN IVPB
15 mg/kg | Freq: Two times a day (BID) | INTRAVENOUS | 0 refills | Status: DC
Start: 2017-05-24 — End: 2017-05-25

## 2017-05-24 MED ORDER — GENTAMICIN IVPB (EXTENDED INTERVAL)
7 mg/kg | INTRAVENOUS | 0 refills | Status: DC
Start: 2017-05-24 — End: 2017-05-25

## 2017-05-24 MED ORDER — IMIPRAMINE HCL 25 MG PO TAB
25 mg | Freq: Every evening | ORAL | 0 refills | Status: DC
Start: 2017-05-24 — End: 2017-05-26
  Administered 2017-05-25 – 2017-05-26 (×2): 25 mg via ORAL

## 2017-05-24 MED ORDER — GENTAMICIN IVPB (EXTENDED INTERVAL)
510 mg | Freq: Once | INTRAVENOUS | 0 refills | Status: DC
Start: 2017-05-24 — End: 2017-05-25

## 2017-05-24 MED ORDER — OXYCODONE 5 MG PO TAB
5-10 mg | ORAL | 0 refills | Status: DC | PRN
Start: 2017-05-24 — End: 2017-05-26
  Administered 2017-05-24 – 2017-05-25 (×3): 10 mg via ORAL

## 2017-05-24 MED ORDER — ONDANSETRON HCL (PF) 4 MG/2 ML IJ SOLN
4 mg | INTRAVENOUS | 0 refills | Status: DC | PRN
Start: 2017-05-24 — End: 2017-05-26
  Administered 2017-05-25: 21:00:00 4 mg via INTRAVENOUS

## 2017-05-25 ENCOUNTER — Encounter: Admit: 2017-05-25 | Discharge: 2017-05-25 | Payer: MEDICARE

## 2017-05-25 LAB — BASIC METABOLIC PANEL
Lab: 136 MMOL/L — ABNORMAL LOW (ref >60)
Lab: 0.9 mg/dL (ref 0.4–1.24)
Lab: 104 MMOL/L — ABNORMAL LOW (ref 98–110)
Lab: 134 MMOL/L — ABNORMAL LOW (ref 137–147)
Lab: 15 mg/dL (ref 7–25)
Lab: 24 MMOL/L (ref 21–30)
Lab: 5.2 MMOL/L — ABNORMAL HIGH (ref 3.5–5.1)
Lab: 6 pg (ref 3–12)
Lab: 8.2 mg/dL — ABNORMAL LOW (ref 8.5–10.6)

## 2017-05-25 LAB — CBC
Lab: 8.6 10*3/uL — ABNORMAL LOW (ref 4.5–11.0)
Lab: 9.8 10*3/uL (ref 4.5–11.0)

## 2017-05-25 LAB — CREATININE
Lab: 1 mg/dL (ref 0.4–1.24)
Lab: >60 mL/min (ref >60)
Lab: >60 mL/min (ref >60)

## 2017-05-25 MED ORDER — FENTANYL CITRATE (PF) 50 MCG/ML IJ SOLN
0 refills | Status: DC
Start: 2017-05-25 — End: 2017-05-25
  Administered 2017-05-25 (×3): 50 ug via INTRAVENOUS
  Administered 2017-05-25: 19:00:00 100 ug via INTRAVENOUS

## 2017-05-25 MED ORDER — PHENYLEPHRINE IN 0.9% NACL(PF) 1 MG/10 ML (100 MCG/ML) IV SYRG
INTRAVENOUS | 0 refills | Status: DC
Start: 2017-05-25 — End: 2017-05-25
  Administered 2017-05-25 (×11): 100 ug via INTRAVENOUS

## 2017-05-25 MED ORDER — PROPOFOL INJ 10 MG/ML IV VIAL
0 refills | Status: DC
Start: 2017-05-25 — End: 2017-05-25
  Administered 2017-05-25: 19:00:00 140 mg via INTRAVENOUS

## 2017-05-25 MED ORDER — SUGAMMADEX 100 MG/ML IV SOLN
INTRAVENOUS | 0 refills | Status: DC
Start: 2017-05-25 — End: 2017-05-25
  Administered 2017-05-25: 21:00:00 186 mg via INTRAVENOUS

## 2017-05-25 MED ORDER — DIPHENHYDRAMINE HCL 50 MG/ML IJ SOLN
25 mg | Freq: Once | INTRAVENOUS | 0 refills | Status: DC | PRN
Start: 2017-05-25 — End: 2017-05-25

## 2017-05-25 MED ORDER — HALOPERIDOL LACTATE 5 MG/ML IJ SOLN
1 mg | Freq: Once | INTRAVENOUS | 0 refills | Status: DC | PRN
Start: 2017-05-25 — End: 2017-05-25

## 2017-05-25 MED ORDER — DEXTRAN 70-HYPROMELLOSE (PF) 0.1-0.3 % OP DPET
0 refills | Status: DC
Start: 2017-05-25 — End: 2017-05-25
  Administered 2017-05-25: 19:00:00 2 [drp] via OPHTHALMIC

## 2017-05-25 MED ORDER — LIDOCAINE (PF) 200 MG/10 ML (2 %) IJ SYRG
0 refills | Status: DC
Start: 2017-05-25 — End: 2017-05-25
  Administered 2017-05-25: 19:00:00 100 mg via INTRAVENOUS

## 2017-05-25 MED ORDER — MIDAZOLAM 1 MG/ML IJ SOLN
INTRAVENOUS | 0 refills | Status: DC
Start: 2017-05-25 — End: 2017-05-25
  Administered 2017-05-25: 19:00:00 2 mg via INTRAVENOUS

## 2017-05-25 MED ORDER — LACTATED RINGERS IV SOLP
INTRAVENOUS | 0 refills | Status: DC
Start: 2017-05-25 — End: 2017-05-25

## 2017-05-25 MED ORDER — DEXAMETHASONE SODIUM PHOSPHATE 4 MG/ML IJ SOLN
INTRAVENOUS | 0 refills | Status: DC
Start: 2017-05-25 — End: 2017-05-25
  Administered 2017-05-25: 19:00:00 4 mg via INTRAVENOUS

## 2017-05-25 MED ORDER — ROCURONIUM 10 MG/ML IV SOLN
INTRAVENOUS | 0 refills | Status: DC
Start: 2017-05-25 — End: 2017-05-25
  Administered 2017-05-25: 19:00:00 50 mg via INTRAVENOUS

## 2017-05-25 MED ORDER — PROMETHAZINE 25 MG/ML IJ SOLN
6.25 mg | INTRAVENOUS | 0 refills | Status: DC | PRN
Start: 2017-05-25 — End: 2017-05-25

## 2017-05-25 MED ORDER — SODIUM CHLORIDE 0.9 % IV SOLP
1000 mL | INTRAVENOUS | 0 refills | Status: DC
Start: 2017-05-25 — End: 2017-05-26
  Administered 2017-05-25: 18:00:00 1000 mL via INTRAVENOUS

## 2017-05-25 MED ORDER — FENTANYL CITRATE (PF) 50 MCG/ML IJ SOLN
25-50 ug | INTRAVENOUS | 0 refills | Status: DC | PRN
Start: 2017-05-25 — End: 2017-05-25

## 2017-05-25 MED ORDER — OXYCODONE 5 MG PO TAB
5-10 mg | Freq: Once | ORAL | 0 refills | Status: DC | PRN
Start: 2017-05-25 — End: 2017-05-25

## 2017-05-25 MED ORDER — LEVOFLOXACIN IN D5W 500 MG/100 ML IV PGBK
500 mg | Freq: Once | INTRAVENOUS | 0 refills | Status: CP
Start: 2017-05-25 — End: ?
  Administered 2017-05-25: 18:00:00 500 mg via INTRAVENOUS

## 2017-05-25 MED ORDER — ACETAMINOPHEN 500 MG PO TAB
1000 mg | Freq: Once | ORAL | 0 refills | Status: CP
Start: 2017-05-25 — End: ?
  Administered 2017-05-25: 18:00:00 1000 mg via ORAL

## 2017-05-25 MED ORDER — HYDROMORPHONE (PF) 2 MG/ML IJ SYRG
.5-1 mg | INTRAVENOUS | 0 refills | Status: DC | PRN
Start: 2017-05-25 — End: 2017-05-25

## 2017-05-26 ENCOUNTER — Encounter: Admit: 2017-05-13 | Discharge: 2017-05-13 | Payer: MEDICARE

## 2017-05-26 ENCOUNTER — Encounter: Admit: 2017-05-26 | Discharge: 2017-05-26 | Payer: MEDICARE

## 2017-05-26 ENCOUNTER — Encounter: Admit: 2017-05-24 | Discharge: 2017-05-26 | Payer: MEDICARE

## 2017-05-26 ENCOUNTER — Encounter: Admit: 2017-05-25 | Discharge: 2017-05-25 | Payer: MEDICARE

## 2017-05-26 ENCOUNTER — Encounter: Admit: 2017-05-26 | Discharge: 2017-05-27 | Payer: MEDICARE

## 2017-05-26 DIAGNOSIS — Z982 Presence of cerebrospinal fluid drainage device: ICD-10-CM

## 2017-05-26 DIAGNOSIS — N319 Neuromuscular dysfunction of bladder, unspecified: ICD-10-CM

## 2017-05-26 DIAGNOSIS — Z79899 Other long term (current) drug therapy: ICD-10-CM

## 2017-05-26 DIAGNOSIS — N200 Calculus of kidney: Principal | ICD-10-CM

## 2017-05-26 DIAGNOSIS — Z906 Acquired absence of other parts of urinary tract: ICD-10-CM

## 2017-05-26 DIAGNOSIS — G4733 Obstructive sleep apnea (adult) (pediatric): ICD-10-CM

## 2017-05-26 DIAGNOSIS — Z87442 Personal history of urinary calculi: ICD-10-CM

## 2017-05-26 DIAGNOSIS — G40909 Epilepsy, unspecified, not intractable, without status epilepticus: ICD-10-CM

## 2017-05-26 DIAGNOSIS — K632 Fistula of intestine: ICD-10-CM

## 2017-05-26 DIAGNOSIS — Z7982 Long term (current) use of aspirin: ICD-10-CM

## 2017-05-26 DIAGNOSIS — I10 Essential (primary) hypertension: ICD-10-CM

## 2017-05-26 DIAGNOSIS — Z881 Allergy status to other antibiotic agents status: ICD-10-CM

## 2017-05-26 DIAGNOSIS — Z880 Allergy status to penicillin: ICD-10-CM

## 2017-05-26 DIAGNOSIS — I429 Cardiomyopathy, unspecified: ICD-10-CM

## 2017-05-26 DIAGNOSIS — N12 Tubulo-interstitial nephritis, not specified as acute or chronic: ICD-10-CM

## 2017-05-26 LAB — STONE ANALYSIS: Lab: 50

## 2017-05-26 LAB — CBC: Lab: 11 K/UL — ABNORMAL HIGH (ref 4.5–11.0)

## 2017-05-26 LAB — BASIC METABOLIC PANEL: Lab: 137 MMOL/L — ABNORMAL LOW (ref >60)

## 2017-05-26 MED ORDER — ACETAMINOPHEN 325 MG PO TAB
650 mg | ORAL | 0 refills | Status: AC | PRN
Start: 2017-05-26 — End: ?

## 2017-05-26 MED ORDER — CIPROFLOXACIN HCL 500 MG PO TAB
500 mg | ORAL_TABLET | Freq: Two times a day (BID) | ORAL | 0 refills | 10.00000 days | Status: AC
Start: 2017-05-26 — End: ?
  Filled 2017-05-26 (×2): qty 10, 5d supply, fill #1

## 2017-05-26 MED ORDER — SENNOSIDES-DOCUSATE SODIUM 8.6-50 MG PO TAB
1 | ORAL_TABLET | Freq: Two times a day (BID) | ORAL | 0 refills | Status: AC
Start: 2017-05-26 — End: 2017-05-30

## 2017-05-26 MED ORDER — POLYETHYLENE GLYCOL 3350 17 GRAM/DOSE PO POWD
17 g | Freq: Every day | ORAL | 3 refills | 30.00000 days | Status: AC
Start: 2017-05-26 — End: 2017-05-30

## 2017-05-26 MED ORDER — OXYCODONE 5 MG PO TAB
5-10 mg | ORAL_TABLET | ORAL | 0 refills | 6.00000 days | Status: AC | PRN
Start: 2017-05-26 — End: 2017-06-07
  Filled 2017-05-26 (×2): qty 20, 2d supply, fill #1

## 2017-05-26 MED ORDER — ASPIRIN 325 MG PO TBEC
325 mg | ORAL_TABLET | ORAL | 3 refills | Status: SS | PRN
Start: 2017-05-26 — End: 2017-07-28

## 2017-05-27 ENCOUNTER — Encounter: Admit: 2017-05-27 | Discharge: 2017-05-27 | Payer: MEDICARE

## 2017-05-27 DIAGNOSIS — R569 Unspecified convulsions: ICD-10-CM

## 2017-05-27 DIAGNOSIS — I10 Essential (primary) hypertension: ICD-10-CM

## 2017-05-27 DIAGNOSIS — F988 Other specified behavioral and emotional disorders with onset usually occurring in childhood and adolescence: ICD-10-CM

## 2017-05-27 DIAGNOSIS — G4733 Obstructive sleep apnea (adult) (pediatric): ICD-10-CM

## 2017-05-27 DIAGNOSIS — N390 Urinary tract infection, site not specified: ICD-10-CM

## 2017-05-27 DIAGNOSIS — T85618A Breakdown (mechanical) of other specified internal prosthetic devices, implants and grafts, initial encounter: ICD-10-CM

## 2017-05-27 DIAGNOSIS — G919 Hydrocephalus, unspecified: ICD-10-CM

## 2017-05-27 DIAGNOSIS — N319 Neuromuscular dysfunction of bladder, unspecified: ICD-10-CM

## 2017-05-27 DIAGNOSIS — R51 Headache: ICD-10-CM

## 2017-05-27 DIAGNOSIS — N200 Calculus of kidney: ICD-10-CM

## 2017-05-27 DIAGNOSIS — Q059 Spina bifida, unspecified: Secondary | ICD-10-CM

## 2017-05-27 DIAGNOSIS — I429 Cardiomyopathy, unspecified: ICD-10-CM

## 2017-05-27 DIAGNOSIS — Q0700 Arnold-Chiari syndrome without spina bifida or hydrocephalus: ICD-10-CM

## 2017-05-27 LAB — CULTURE-URINE W/SENSITIVITY: Lab: >10 — AB

## 2017-05-28 LAB — CULTURE-WOUND/TISSUE/FLUID(AEROBIC ONLY)W/SENSITIVITY

## 2017-05-30 ENCOUNTER — Ambulatory Visit: Admit: 2017-05-30 | Discharge: 2017-05-31 | Payer: MEDICARE

## 2017-05-30 ENCOUNTER — Encounter: Admit: 2017-05-30 | Discharge: 2017-05-30 | Payer: MEDICARE

## 2017-05-30 DIAGNOSIS — F988 Other specified behavioral and emotional disorders with onset usually occurring in childhood and adolescence: ICD-10-CM

## 2017-05-30 DIAGNOSIS — Q0700 Arnold-Chiari syndrome without spina bifida or hydrocephalus: ICD-10-CM

## 2017-05-30 DIAGNOSIS — I10 Essential (primary) hypertension: ICD-10-CM

## 2017-05-30 DIAGNOSIS — N200 Calculus of kidney: Principal | ICD-10-CM

## 2017-05-30 DIAGNOSIS — N390 Urinary tract infection, site not specified: ICD-10-CM

## 2017-05-30 DIAGNOSIS — G4733 Obstructive sleep apnea (adult) (pediatric): ICD-10-CM

## 2017-05-30 DIAGNOSIS — N319 Neuromuscular dysfunction of bladder, unspecified: ICD-10-CM

## 2017-05-30 DIAGNOSIS — Q059 Spina bifida, unspecified: Principal | ICD-10-CM

## 2017-05-30 DIAGNOSIS — T85618A Breakdown (mechanical) of other specified internal prosthetic devices, implants and grafts, initial encounter: ICD-10-CM

## 2017-05-30 DIAGNOSIS — I429 Cardiomyopathy, unspecified: ICD-10-CM

## 2017-05-30 DIAGNOSIS — R569 Unspecified convulsions: ICD-10-CM

## 2017-05-30 DIAGNOSIS — R51 Headache: ICD-10-CM

## 2017-05-30 DIAGNOSIS — G919 Hydrocephalus, unspecified: ICD-10-CM

## 2017-05-30 LAB — CULTURE-ANAEROBIC

## 2017-06-02 ENCOUNTER — Encounter: Admit: 2017-06-02 | Discharge: 2017-06-02 | Payer: MEDICARE

## 2017-06-02 DIAGNOSIS — G4733 Obstructive sleep apnea (adult) (pediatric): ICD-10-CM

## 2017-06-02 DIAGNOSIS — I429 Cardiomyopathy, unspecified: ICD-10-CM

## 2017-06-02 DIAGNOSIS — N319 Neuromuscular dysfunction of bladder, unspecified: ICD-10-CM

## 2017-06-02 DIAGNOSIS — Q059 Spina bifida, unspecified: Secondary | ICD-10-CM

## 2017-06-02 DIAGNOSIS — F988 Other specified behavioral and emotional disorders with onset usually occurring in childhood and adolescence: ICD-10-CM

## 2017-06-02 DIAGNOSIS — I10 Essential (primary) hypertension: ICD-10-CM

## 2017-06-02 DIAGNOSIS — R51 Headache: ICD-10-CM

## 2017-06-02 DIAGNOSIS — R569 Unspecified convulsions: ICD-10-CM

## 2017-06-02 DIAGNOSIS — Q0700 Arnold-Chiari syndrome without spina bifida or hydrocephalus: ICD-10-CM

## 2017-06-02 DIAGNOSIS — G919 Hydrocephalus, unspecified: ICD-10-CM

## 2017-06-02 DIAGNOSIS — N390 Urinary tract infection, site not specified: ICD-10-CM

## 2017-06-02 DIAGNOSIS — T85618A Breakdown (mechanical) of other specified internal prosthetic devices, implants and grafts, initial encounter: ICD-10-CM

## 2017-06-02 DIAGNOSIS — N200 Calculus of kidney: ICD-10-CM

## 2017-06-05 ENCOUNTER — Encounter: Admit: 2017-06-05 | Discharge: 2017-06-05 | Payer: MEDICARE

## 2017-06-07 ENCOUNTER — Encounter: Admit: 2017-06-07 | Discharge: 2017-06-07 | Payer: MEDICARE

## 2017-06-07 ENCOUNTER — Ambulatory Visit: Admit: 2017-06-07 | Discharge: 2017-06-08 | Payer: MEDICARE

## 2017-06-07 DIAGNOSIS — R569 Unspecified convulsions: ICD-10-CM

## 2017-06-07 DIAGNOSIS — G919 Hydrocephalus, unspecified: ICD-10-CM

## 2017-06-07 DIAGNOSIS — N200 Calculus of kidney: ICD-10-CM

## 2017-06-07 DIAGNOSIS — I429 Cardiomyopathy, unspecified: ICD-10-CM

## 2017-06-07 DIAGNOSIS — Z936 Other artificial openings of urinary tract status: ICD-10-CM

## 2017-06-07 DIAGNOSIS — R51 Headache: ICD-10-CM

## 2017-06-07 DIAGNOSIS — G4733 Obstructive sleep apnea (adult) (pediatric): ICD-10-CM

## 2017-06-07 DIAGNOSIS — R109 Unspecified abdominal pain: ICD-10-CM

## 2017-06-07 DIAGNOSIS — Q059 Spina bifida, unspecified: Principal | ICD-10-CM

## 2017-06-07 DIAGNOSIS — I10 Essential (primary) hypertension: ICD-10-CM

## 2017-06-07 DIAGNOSIS — R8271 Bacteriuria: Principal | ICD-10-CM

## 2017-06-07 DIAGNOSIS — N319 Neuromuscular dysfunction of bladder, unspecified: ICD-10-CM

## 2017-06-07 DIAGNOSIS — N390 Urinary tract infection, site not specified: ICD-10-CM

## 2017-06-07 DIAGNOSIS — Q0700 Arnold-Chiari syndrome without spina bifida or hydrocephalus: ICD-10-CM

## 2017-06-07 DIAGNOSIS — F988 Other specified behavioral and emotional disorders with onset usually occurring in childhood and adolescence: ICD-10-CM

## 2017-06-07 DIAGNOSIS — T85618A Breakdown (mechanical) of other specified internal prosthetic devices, implants and grafts, initial encounter: ICD-10-CM

## 2017-06-15 ENCOUNTER — Encounter: Admit: 2017-06-15 | Discharge: 2017-06-15 | Payer: MEDICARE

## 2017-06-15 DIAGNOSIS — N390 Urinary tract infection, site not specified: ICD-10-CM

## 2017-06-15 DIAGNOSIS — N200 Calculus of kidney: ICD-10-CM

## 2017-06-15 DIAGNOSIS — G919 Hydrocephalus, unspecified: ICD-10-CM

## 2017-06-15 DIAGNOSIS — F988 Other specified behavioral and emotional disorders with onset usually occurring in childhood and adolescence: ICD-10-CM

## 2017-06-15 DIAGNOSIS — T85618A Breakdown (mechanical) of other specified internal prosthetic devices, implants and grafts, initial encounter: ICD-10-CM

## 2017-06-15 DIAGNOSIS — I429 Cardiomyopathy, unspecified: ICD-10-CM

## 2017-06-15 DIAGNOSIS — I10 Essential (primary) hypertension: ICD-10-CM

## 2017-06-15 DIAGNOSIS — R569 Unspecified convulsions: ICD-10-CM

## 2017-06-15 DIAGNOSIS — R51 Headache: ICD-10-CM

## 2017-06-15 DIAGNOSIS — Q0700 Arnold-Chiari syndrome without spina bifida or hydrocephalus: ICD-10-CM

## 2017-06-15 DIAGNOSIS — Q059 Spina bifida, unspecified: Secondary | ICD-10-CM

## 2017-06-15 DIAGNOSIS — N319 Neuromuscular dysfunction of bladder, unspecified: ICD-10-CM

## 2017-06-15 DIAGNOSIS — G4733 Obstructive sleep apnea (adult) (pediatric): ICD-10-CM

## 2017-06-19 ENCOUNTER — Encounter: Admit: 2017-06-19 | Discharge: 2017-06-19 | Payer: MEDICARE

## 2017-06-27 LAB — CULTURE-FUNGAL,OTHER

## 2017-07-07 ENCOUNTER — Encounter: Admit: 2017-07-07 | Discharge: 2017-07-07 | Payer: MEDICARE

## 2017-07-27 ENCOUNTER — Encounter: Admit: 2017-07-27 | Discharge: 2017-07-27 | Payer: MEDICARE

## 2017-07-27 ENCOUNTER — Emergency Department: Admit: 2017-07-27 | Discharge: 2017-07-27 | Payer: MEDICARE

## 2017-07-27 DIAGNOSIS — R51 Headache: ICD-10-CM

## 2017-07-27 DIAGNOSIS — N390 Urinary tract infection, site not specified: ICD-10-CM

## 2017-07-27 DIAGNOSIS — Z982 Presence of cerebrospinal fluid drainage device: ICD-10-CM

## 2017-07-27 DIAGNOSIS — G911 Obstructive hydrocephalus: Secondary | ICD-10-CM

## 2017-07-27 DIAGNOSIS — A498 Other bacterial infections of unspecified site: ICD-10-CM

## 2017-07-27 LAB — URINALYSIS DIPSTICK
Lab: NEGATIVE U/L (ref 7–40)
Lab: NEGATIVE U/L (ref 7–56)
Lab: NEGATIVE mg/dL — ABNORMAL LOW (ref 0.3–1.2)

## 2017-07-27 LAB — COMPREHENSIVE METABOLIC PANEL
Lab: 10 10*3/uL (ref 3–12)
Lab: 139 MMOL/L (ref 137–147)
Lab: >60 mL/min (ref >60)
Lab: >60 mL/min (ref >60)

## 2017-07-27 LAB — URINALYSIS, MICROSCOPIC

## 2017-07-27 LAB — CBC AND DIFF
Lab: 0 10*3/uL (ref 0–0.20)
Lab: 0.2 10*3/uL (ref 0–0.45)
Lab: 8.4 10*3/uL (ref 4.5–11.0)

## 2017-07-27 MED ORDER — DOCUSATE SODIUM 100 MG PO CAP
100 mg | Freq: Two times a day (BID) | ORAL | 0 refills | Status: DC
Start: 2017-07-27 — End: 2017-08-05
  Administered 2017-07-31 – 2017-08-05 (×4): 100 mg via ORAL

## 2017-07-27 MED ORDER — LEVETIRACETAM 500 MG PO TAB
500 mg | Freq: Two times a day (BID) | ORAL | 0 refills | Status: DC
Start: 2017-07-27 — End: 2017-08-05
  Administered 2017-07-28 – 2017-08-05 (×16): 500 mg via ORAL

## 2017-07-27 MED ORDER — SODIUM CHLORIDE 0.9 % IV SOLP
1000 mL | INTRAVENOUS | 0 refills | Status: CP
Start: 2017-07-27 — End: ?
  Administered 2017-07-27: 13:00:00 1000 mL via INTRAVENOUS

## 2017-07-27 MED ORDER — OXYCODONE 5 MG PO TAB
5-10 mg | ORAL | 0 refills | Status: DC | PRN
Start: 2017-07-27 — End: 2017-07-29
  Administered 2017-07-27 – 2017-07-28 (×2): 10 mg via ORAL
  Administered 2017-07-28: 02:00:00 5 mg via ORAL
  Administered 2017-07-28 (×2): 10 mg via ORAL
  Administered 2017-07-28: 04:00:00 5 mg via ORAL
  Administered 2017-07-28: 22:00:00 10 mg via ORAL
  Administered 2017-07-29: 03:00:00 5 mg via ORAL
  Administered 2017-07-29: 14:00:00 10 mg via ORAL

## 2017-07-27 MED ORDER — ACETAMINOPHEN 325 MG PO TAB
650 mg | ORAL | 0 refills | Status: DC | PRN
Start: 2017-07-27 — End: 2017-08-05
  Administered 2017-07-27 – 2017-08-05 (×16): 650 mg via ORAL

## 2017-07-27 MED ORDER — MAGNESIUM HYDROXIDE 2,400 MG/10 ML PO SUSP
10 mL | Freq: Every day | ORAL | 0 refills | Status: DC
Start: 2017-07-27 — End: 2017-08-05

## 2017-07-27 MED ORDER — CARVEDILOL 12.5 MG PO TAB
12.5 mg | Freq: Two times a day (BID) | ORAL | 0 refills | Status: DC
Start: 2017-07-27 — End: 2017-08-05
  Administered 2017-07-28 – 2017-08-05 (×18): 12.5 mg via ORAL

## 2017-07-27 MED ORDER — SPIRONOLACTONE 25 MG PO TAB
25 mg | Freq: Every day | ORAL | 0 refills | Status: DC
Start: 2017-07-27 — End: 2017-08-05
  Administered 2017-07-28 – 2017-08-05 (×9): 25 mg via ORAL

## 2017-07-27 MED ORDER — FLUCONAZOLE 150 MG PO TAB
150 mg | Freq: Once | ORAL | 0 refills | Status: CP
Start: 2017-07-27 — End: ?
  Administered 2017-07-27: 14:00:00 150 mg via ORAL

## 2017-07-27 MED ORDER — LOSARTAN 25 MG PO TAB
25 mg | Freq: Every day | ORAL | 0 refills | Status: DC
Start: 2017-07-27 — End: 2017-08-05
  Administered 2017-07-28 – 2017-08-05 (×9): 25 mg via ORAL

## 2017-07-27 MED ORDER — ONDANSETRON HCL (PF) 4 MG/2 ML IJ SOLN
4 mg | INTRAVENOUS | 0 refills | Status: DC | PRN
Start: 2017-07-27 — End: 2017-08-05
  Administered 2017-08-04: 15:00:00 4 mg via INTRAVENOUS

## 2017-07-27 MED ORDER — KETOROLAC 15 MG/ML IJ SOLN
15 mg | Freq: Once | INTRAVENOUS | 0 refills | Status: CP
Start: 2017-07-27 — End: ?
  Administered 2017-07-27: 13:00:00 15 mg via INTRAVENOUS

## 2017-07-27 MED ORDER — SENNOSIDES-DOCUSATE SODIUM 8.6-50 MG PO TAB
1 | Freq: Two times a day (BID) | ORAL | 0 refills | Status: DC
Start: 2017-07-27 — End: 2017-08-05
  Administered 2017-07-31 – 2017-08-05 (×4): 1 via ORAL

## 2017-07-27 MED ORDER — DIPHENHYDRAMINE HCL 50 MG/ML IJ SOLN
25 mg | Freq: Once | INTRAVENOUS | 0 refills | Status: CP
Start: 2017-07-27 — End: ?
  Administered 2017-07-27: 13:00:00 25 mg via INTRAVENOUS

## 2017-07-28 ENCOUNTER — Inpatient Hospital Stay: Admit: 2017-07-28 | Discharge: 2017-07-28 | Payer: MEDICARE

## 2017-07-28 ENCOUNTER — Encounter: Admit: 2017-07-28 | Discharge: 2017-07-28 | Payer: MEDICARE

## 2017-07-28 DIAGNOSIS — R51 Headache: ICD-10-CM

## 2017-07-28 LAB — CBC AND DIFF: Lab: 7.1 K/UL — ABNORMAL LOW (ref >40)

## 2017-07-28 LAB — BASIC METABOLIC PANEL: Lab: 138 MMOL/L — ABNORMAL HIGH (ref >60)

## 2017-07-28 MED ORDER — CEFEPIME 1G/100ML NS IVPB (MB+)
1 g | Freq: Two times a day (BID) | INTRAVENOUS | 0 refills | Status: DC
Start: 2017-07-28 — End: 2017-07-29
  Administered 2017-07-29 (×4): 1 g via INTRAVENOUS

## 2017-07-29 ENCOUNTER — Encounter: Admit: 2017-07-29 | Discharge: 2017-07-29 | Payer: MEDICARE

## 2017-07-29 ENCOUNTER — Inpatient Hospital Stay: Admit: 2017-07-29 | Discharge: 2017-07-29 | Payer: MEDICARE

## 2017-07-29 DIAGNOSIS — R51 Headache: ICD-10-CM

## 2017-07-29 LAB — CULTURE-URINE W/SENSITIVITY
Lab: >10 — AB
Lab: >10

## 2017-07-29 LAB — BASIC METABOLIC PANEL: Lab: 132 MMOL/L — ABNORMAL LOW (ref 137–147)

## 2017-07-29 LAB — CBC AND DIFF: Lab: 7.7 K/UL — ABNORMAL LOW (ref 4.5–11.0)

## 2017-07-29 MED ORDER — SODIUM CHLORIDE 0.9 % IV SOLP
1000 mL | INTRAVENOUS | 0 refills | Status: CN
Start: 2017-07-29 — End: ?

## 2017-07-29 MED ORDER — FENTANYL CITRATE (PF) 50 MCG/ML IJ SOLN
50 ug | INTRAVENOUS | 0 refills | Status: DC | PRN
Start: 2017-07-29 — End: 2017-08-05
  Administered 2017-08-04: 23:00:00 50 ug via INTRAVENOUS

## 2017-07-29 MED ORDER — CEFTRIAXONE INJ 2GM IVP
2 g | INTRAVENOUS | 0 refills | Status: DC
Start: 2017-07-29 — End: 2017-08-05
  Administered 2017-07-29 – 2017-08-05 (×8): 2 g via INTRAVENOUS

## 2017-07-29 MED ORDER — LIDOCAINE (PF) 10 MG/ML (1 %) IJ SOLN
.1-2 mL | INTRAMUSCULAR | 0 refills | Status: CN | PRN
Start: 2017-07-29 — End: ?

## 2017-07-29 MED ORDER — HEPARIN, PORCINE (PF) 5,000 UNIT/0.5 ML IJ SYRG
5000 [IU] | SUBCUTANEOUS | 0 refills | Status: CP
Start: 2017-07-29 — End: ?
  Administered 2017-07-30 – 2017-08-04 (×14): 5000 [IU] via SUBCUTANEOUS

## 2017-07-29 MED ORDER — OXYCODONE 5 MG PO TAB
5-15 mg | ORAL | 0 refills | Status: DC | PRN
Start: 2017-07-29 — End: 2017-08-05
  Administered 2017-07-29: 16:00:00 5 mg via ORAL
  Administered 2017-07-30 – 2017-08-01 (×4): 15 mg via ORAL
  Administered 2017-08-02 – 2017-08-03 (×4): 5 mg via ORAL
  Administered 2017-08-04 – 2017-08-05 (×3): 15 mg via ORAL

## 2017-07-29 MED ADMIN — SODIUM CHLORIDE 0.9 % IV SOLP [27838]: 250 mL | INTRAVENOUS | @ 04:00:00 | Stop: 2017-07-29 | NDC 00338004902

## 2017-07-30 ENCOUNTER — Inpatient Hospital Stay: Admit: 2017-07-30 | Discharge: 2017-07-30 | Payer: MEDICARE

## 2017-07-30 DIAGNOSIS — R51 Headache: ICD-10-CM

## 2017-07-30 LAB — BASIC METABOLIC PANEL: Lab: 136 MMOL/L — ABNORMAL LOW (ref 137–147)

## 2017-07-30 LAB — CBC AND DIFF: Lab: 7.4 K/UL (ref >60)

## 2017-07-30 MED ORDER — MAGNESIUM SULFATE IN D5W 1 GRAM/100 ML IV PGBK
1 g | Freq: Once | INTRAVENOUS | 0 refills | Status: AC
Start: 2017-07-30 — End: ?

## 2017-07-30 MED ORDER — PROCHLORPERAZINE EDISYLATE 5 MG/ML IJ SOLN
10 mg | Freq: Once | INTRAVENOUS | 0 refills | Status: AC
Start: 2017-07-30 — End: ?

## 2017-08-02 ENCOUNTER — Encounter: Admit: 2017-08-02 | Discharge: 2017-08-02 | Payer: MEDICARE

## 2017-08-02 DIAGNOSIS — F988 Other specified behavioral and emotional disorders with onset usually occurring in childhood and adolescence: ICD-10-CM

## 2017-08-02 DIAGNOSIS — I10 Essential (primary) hypertension: ICD-10-CM

## 2017-08-02 DIAGNOSIS — I429 Cardiomyopathy, unspecified: ICD-10-CM

## 2017-08-02 DIAGNOSIS — G919 Hydrocephalus, unspecified: ICD-10-CM

## 2017-08-02 DIAGNOSIS — N319 Neuromuscular dysfunction of bladder, unspecified: ICD-10-CM

## 2017-08-02 DIAGNOSIS — N200 Calculus of kidney: ICD-10-CM

## 2017-08-02 DIAGNOSIS — Q0700 Arnold-Chiari syndrome without spina bifida or hydrocephalus: ICD-10-CM

## 2017-08-02 DIAGNOSIS — R51 Headache: ICD-10-CM

## 2017-08-02 DIAGNOSIS — R569 Unspecified convulsions: ICD-10-CM

## 2017-08-02 DIAGNOSIS — T85618A Breakdown (mechanical) of other specified internal prosthetic devices, implants and grafts, initial encounter: ICD-10-CM

## 2017-08-02 DIAGNOSIS — G4733 Obstructive sleep apnea (adult) (pediatric): ICD-10-CM

## 2017-08-02 DIAGNOSIS — N390 Urinary tract infection, site not specified: ICD-10-CM

## 2017-08-02 DIAGNOSIS — Q059 Spina bifida, unspecified: Principal | ICD-10-CM

## 2017-08-02 LAB — CBC: Lab: 4.9 K/UL — ABNORMAL LOW (ref 4.5–11.0)

## 2017-08-02 LAB — BASIC METABOLIC PANEL: Lab: 137 MMOL/L — ABNORMAL LOW (ref 137–147)

## 2017-08-03 ENCOUNTER — Encounter: Admit: 2017-08-03 | Discharge: 2017-08-03 | Payer: MEDICARE

## 2017-08-03 DIAGNOSIS — I10 Essential (primary) hypertension: ICD-10-CM

## 2017-08-03 DIAGNOSIS — G4733 Obstructive sleep apnea (adult) (pediatric): ICD-10-CM

## 2017-08-03 DIAGNOSIS — N319 Neuromuscular dysfunction of bladder, unspecified: ICD-10-CM

## 2017-08-03 DIAGNOSIS — N390 Urinary tract infection, site not specified: ICD-10-CM

## 2017-08-03 DIAGNOSIS — T85618A Breakdown (mechanical) of other specified internal prosthetic devices, implants and grafts, initial encounter: ICD-10-CM

## 2017-08-03 DIAGNOSIS — G919 Hydrocephalus, unspecified: ICD-10-CM

## 2017-08-03 DIAGNOSIS — Q0700 Arnold-Chiari syndrome without spina bifida or hydrocephalus: ICD-10-CM

## 2017-08-03 DIAGNOSIS — F988 Other specified behavioral and emotional disorders with onset usually occurring in childhood and adolescence: ICD-10-CM

## 2017-08-03 DIAGNOSIS — I429 Cardiomyopathy, unspecified: ICD-10-CM

## 2017-08-03 DIAGNOSIS — N200 Calculus of kidney: ICD-10-CM

## 2017-08-03 DIAGNOSIS — R569 Unspecified convulsions: ICD-10-CM

## 2017-08-03 DIAGNOSIS — Q059 Spina bifida, unspecified: Secondary | ICD-10-CM

## 2017-08-03 DIAGNOSIS — R51 Headache: ICD-10-CM

## 2017-08-03 LAB — CULTURE-BLOOD W/SENSITIVITY

## 2017-08-04 ENCOUNTER — Encounter: Admit: 2017-08-04 | Discharge: 2017-08-04 | Payer: MEDICARE

## 2017-08-04 DIAGNOSIS — I10 Essential (primary) hypertension: ICD-10-CM

## 2017-08-04 DIAGNOSIS — R569 Unspecified convulsions: ICD-10-CM

## 2017-08-04 DIAGNOSIS — G919 Hydrocephalus, unspecified: ICD-10-CM

## 2017-08-04 DIAGNOSIS — T85618A Breakdown (mechanical) of other specified internal prosthetic devices, implants and grafts, initial encounter: ICD-10-CM

## 2017-08-04 DIAGNOSIS — F988 Other specified behavioral and emotional disorders with onset usually occurring in childhood and adolescence: ICD-10-CM

## 2017-08-04 DIAGNOSIS — G4733 Obstructive sleep apnea (adult) (pediatric): ICD-10-CM

## 2017-08-04 DIAGNOSIS — I429 Cardiomyopathy, unspecified: ICD-10-CM

## 2017-08-04 DIAGNOSIS — R51 Headache: ICD-10-CM

## 2017-08-04 DIAGNOSIS — N390 Urinary tract infection, site not specified: ICD-10-CM

## 2017-08-04 DIAGNOSIS — N200 Calculus of kidney: ICD-10-CM

## 2017-08-04 DIAGNOSIS — Q059 Spina bifida, unspecified: ICD-10-CM

## 2017-08-04 DIAGNOSIS — Z982 Presence of cerebrospinal fluid drainage device: Principal | ICD-10-CM

## 2017-08-04 DIAGNOSIS — Q0700 Arnold-Chiari syndrome without spina bifida or hydrocephalus: ICD-10-CM

## 2017-08-04 DIAGNOSIS — N319 Neuromuscular dysfunction of bladder, unspecified: ICD-10-CM

## 2017-08-04 MED ORDER — DIPHENHYDRAMINE HCL 50 MG/ML IJ SOLN
12.5 mg | Freq: Once | INTRAVENOUS | 0 refills | Status: DC | PRN
Start: 2017-08-04 — End: 2017-08-04

## 2017-08-04 MED ORDER — PROMETHAZINE 25 MG/ML IJ SOLN
6.25 mg | INTRAVENOUS | 0 refills | Status: DC | PRN
Start: 2017-08-04 — End: 2017-08-04

## 2017-08-04 MED ORDER — LIDOCAINE (PF) 200 MG/10 ML (2 %) IJ SYRG
0 refills | Status: DC
Start: 2017-08-04 — End: 2017-08-04
  Administered 2017-08-04: 13:00:00 100 mg via INTRAVENOUS

## 2017-08-04 MED ORDER — VANCOMYCIN PHARMACY TO MANAGE
1 | 0 refills | Status: DC
Start: 2017-08-04 — End: 2017-08-05

## 2017-08-04 MED ORDER — FENTANYL CITRATE (PF) 50 MCG/ML IJ SOLN
50 ug | INTRAVENOUS | 0 refills | Status: DC | PRN
Start: 2017-08-04 — End: 2017-08-04
  Administered 2017-08-04 (×2): 50 ug via INTRAVENOUS

## 2017-08-04 MED ORDER — LIDOCAINE-EPINEPHRINE 1 %-1:100,000 IJ SOLN
0 refills | Status: DC
Start: 2017-08-04 — End: 2017-08-04
  Administered 2017-08-04: 14:00:00 2.5 mL via INTRAMUSCULAR

## 2017-08-04 MED ORDER — MIDAZOLAM 1 MG/ML IJ SOLN
INTRAVENOUS | 0 refills | Status: DC
Start: 2017-08-04 — End: 2017-08-04
  Administered 2017-08-04: 13:00:00 1 mg via INTRAVENOUS

## 2017-08-04 MED ORDER — OXYCODONE 5 MG/5 ML PO SOLN
5-10 mg | Freq: Once | ORAL | 0 refills | Status: CP | PRN
Start: 2017-08-04 — End: ?
  Administered 2017-08-04: 16:00:00 10 mg via ORAL

## 2017-08-04 MED ORDER — FENTANYL CITRATE (PF) 50 MCG/ML IJ SOLN
0 refills | Status: DC
Start: 2017-08-04 — End: 2017-08-04
  Administered 2017-08-04 (×4): 50 ug via INTRAVENOUS

## 2017-08-04 MED ORDER — VANCOMYCIN 1,500 MG IVPB
1500 mg | Freq: Two times a day (BID) | INTRAVENOUS | 0 refills | Status: DC
Start: 2017-08-04 — End: 2017-08-05
  Administered 2017-08-05 (×4): 1500 mg via INTRAVENOUS

## 2017-08-04 MED ORDER — PROPOFOL INJ 10 MG/ML IV VIAL
0 refills | Status: DC
Start: 2017-08-04 — End: 2017-08-04
  Administered 2017-08-04: 14:00:00 30 mg via INTRAVENOUS
  Administered 2017-08-04: 13:00:00 100 mg via INTRAVENOUS
  Administered 2017-08-04: 13:00:00 50 mg via INTRAVENOUS
  Administered 2017-08-04: 14:00:00 20 mg via INTRAVENOUS

## 2017-08-04 MED ORDER — BACITRACIN 50,000 UN LR 500 ML IRR BOT (OR)
0 refills | Status: DC
Start: 2017-08-04 — End: 2017-08-04
  Administered 2017-08-04 (×2): 500 mL

## 2017-08-04 MED ORDER — DEXAMETHASONE SODIUM PHOSPHATE 4 MG/ML IJ SOLN
INTRAVENOUS | 0 refills | Status: DC
Start: 2017-08-04 — End: 2017-08-04
  Administered 2017-08-04: 14:00:00 4 mg via INTRAVENOUS

## 2017-08-04 MED ORDER — LIDOCAINE (PF) 10 MG/ML (1 %) IJ SOLN
.1-2 mL | INTRAMUSCULAR | 0 refills | Status: DC | PRN
Start: 2017-08-04 — End: 2017-08-04

## 2017-08-04 MED ORDER — SODIUM CHLORIDE 0.9 % IV SOLP
1000 mL | INTRAVENOUS | 0 refills | Status: DC
Start: 2017-08-04 — End: 2017-08-05

## 2017-08-04 MED ORDER — HYDROMORPHONE (PF) 2 MG/ML IJ SYRG
0 refills | Status: DC
Start: 2017-08-04 — End: 2017-08-04
  Administered 2017-08-04 (×2): .25 mg via INTRAVENOUS

## 2017-08-04 MED ORDER — HALOPERIDOL LACTATE 5 MG/ML IJ SOLN
.5 mg | Freq: Once | INTRAVENOUS | 0 refills | Status: DC | PRN
Start: 2017-08-04 — End: 2017-08-04

## 2017-08-04 MED ORDER — VANCOMYCIN 1,500 MG IVPB
0 refills | Status: DC
Start: 2017-08-04 — End: 2017-08-04
  Administered 2017-08-04 (×2): 1500 mg via INTRAVENOUS

## 2017-08-04 MED ORDER — DEXTRAN 70-HYPROMELLOSE (PF) 0.1-0.3 % OP DPET
0 refills | Status: DC
Start: 2017-08-04 — End: 2017-08-04
  Administered 2017-08-04: 13:00:00 2 [drp] via OPHTHALMIC

## 2017-08-04 MED ORDER — BACITRACIN ZINC 500 UNIT/GRAM TP OINT
0 refills | Status: DC
Start: 2017-08-04 — End: 2017-08-04
  Administered 2017-08-04: 15:00:00 1 via TOPICAL

## 2017-08-04 MED ORDER — ROCURONIUM 10 MG/ML IV SOLN
INTRAVENOUS | 0 refills | Status: DC
Start: 2017-08-04 — End: 2017-08-04
  Administered 2017-08-04: 15:00:00 10 mg via INTRAVENOUS
  Administered 2017-08-04 (×2): 20 mg via INTRAVENOUS
  Administered 2017-08-04: 13:00:00 30 mg via INTRAVENOUS

## 2017-08-04 MED ORDER — THROMBIN (BOVINE) 5,000 UNIT TP SOLR
0 refills | Status: DC
Start: 2017-08-04 — End: 2017-08-04
  Administered 2017-08-04: 14:00:00 5000 [IU] via TOPICAL

## 2017-08-04 MED ORDER — HYDROMORPHONE (PF) 2 MG/ML IJ SYRG
.5 mg | INTRAVENOUS | 0 refills | Status: DC | PRN
Start: 2017-08-04 — End: 2017-08-04

## 2017-08-04 MED ORDER — SUGAMMADEX 100 MG/ML IV SOLN
INTRAVENOUS | 0 refills | Status: DC
Start: 2017-08-04 — End: 2017-08-04
  Administered 2017-08-04: 15:00:00 180 mg via INTRAVENOUS

## 2017-08-04 MED ADMIN — SODIUM CHLORIDE 0.9 % IV SOLP [27838]: 1000 mL | INTRAVENOUS | @ 11:00:00 | Stop: 2017-08-04 | NDC 00338004904

## 2017-08-05 ENCOUNTER — Ambulatory Visit: Admit: 2017-08-05 | Discharge: 2017-08-05 | Payer: MEDICARE

## 2017-08-05 ENCOUNTER — Emergency Department: Admit: 2017-07-27 | Discharge: 2017-07-27 | Payer: MEDICARE

## 2017-08-05 ENCOUNTER — Inpatient Hospital Stay: Admit: 2017-08-05 | Discharge: 2017-08-05 | Payer: MEDICARE

## 2017-08-05 ENCOUNTER — Inpatient Hospital Stay: Admit: 2017-07-27 | Discharge: 2017-08-05 | Disposition: A | Discharge status: Home Health | Payer: MEDICARE

## 2017-08-05 ENCOUNTER — Encounter: Admit: 2017-08-05 | Discharge: 2017-08-05 | Payer: MEDICARE

## 2017-08-05 DIAGNOSIS — G932 Benign intracranial hypertension: ICD-10-CM

## 2017-08-05 DIAGNOSIS — N12 Tubulo-interstitial nephritis, not specified as acute or chronic: ICD-10-CM

## 2017-08-05 DIAGNOSIS — N136 Pyonephrosis: ICD-10-CM

## 2017-08-05 DIAGNOSIS — N319 Neuromuscular dysfunction of bladder, unspecified: ICD-10-CM

## 2017-08-05 DIAGNOSIS — Z906 Acquired absence of other parts of urinary tract: ICD-10-CM

## 2017-08-05 DIAGNOSIS — K660 Peritoneal adhesions (postprocedural) (postinfection): ICD-10-CM

## 2017-08-05 DIAGNOSIS — G911 Obstructive hydrocephalus: ICD-10-CM

## 2017-08-05 DIAGNOSIS — Z87442 Personal history of urinary calculi: ICD-10-CM

## 2017-08-05 DIAGNOSIS — I429 Cardiomyopathy, unspecified: ICD-10-CM

## 2017-08-05 DIAGNOSIS — B964 Proteus (mirabilis) (morganii) as the cause of diseases classified elsewhere: ICD-10-CM

## 2017-08-05 DIAGNOSIS — R51 Headache: ICD-10-CM

## 2017-08-05 DIAGNOSIS — Z936 Other artificial openings of urinary tract status: ICD-10-CM

## 2017-08-05 DIAGNOSIS — G4733 Obstructive sleep apnea (adult) (pediatric): ICD-10-CM

## 2017-08-05 DIAGNOSIS — T8501XA Breakdown (mechanical) of ventricular intracranial (communicating) shunt, initial encounter: Principal | ICD-10-CM

## 2017-08-05 DIAGNOSIS — I10 Essential (primary) hypertension: ICD-10-CM

## 2017-08-05 IMAGING — CT CT HEAD W/O CM
2 series · 16 of 30 positions shown, 18 images · non-contrast
Comparison: None.

CLINICAL DATA: Initial evaluation for acute trauma.

EXAM:
CT HEAD WITHOUT CONTRAST
TECHNIQUE: Contiguous axial images were obtained from the base of the skull
through the vertex without intravenous contrast.

[Series 2: head wo · axial · 0.47mm/px · z∈[-100,+20]mm · 8 of 32 slices shown, 10 images]
[im 4/32  brain]
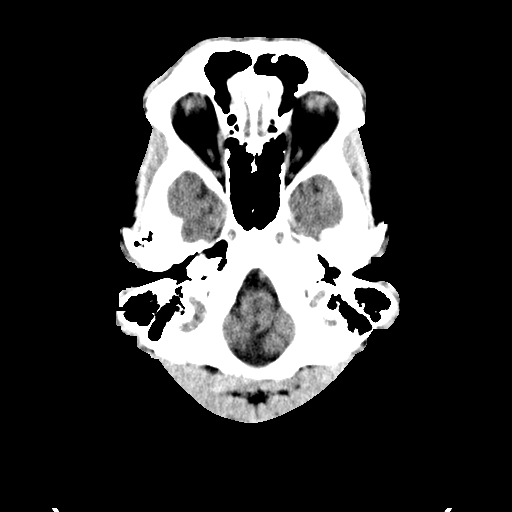
[im 4/32  bone]
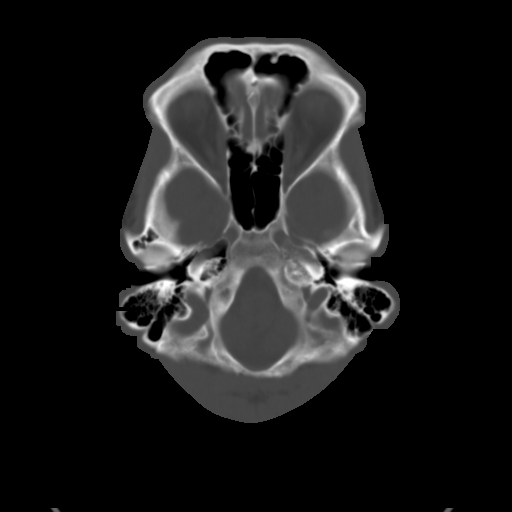
[im 7/32  brain]
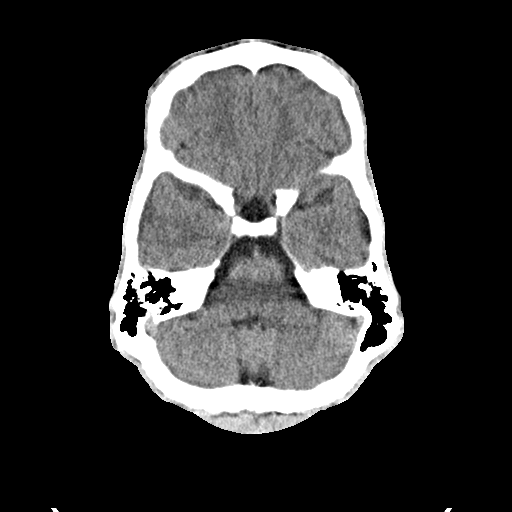
[im 11/32  brain]
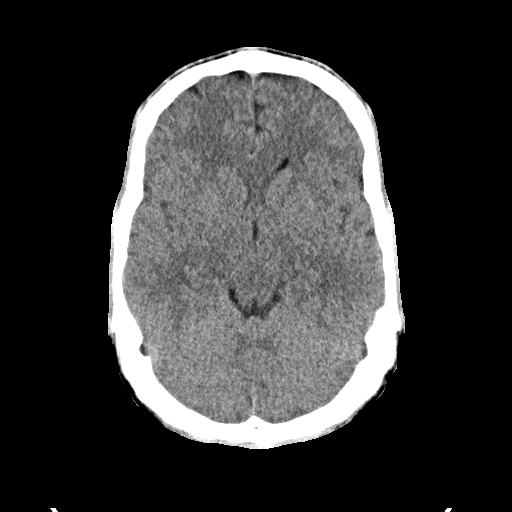
[im 14/32  brain]
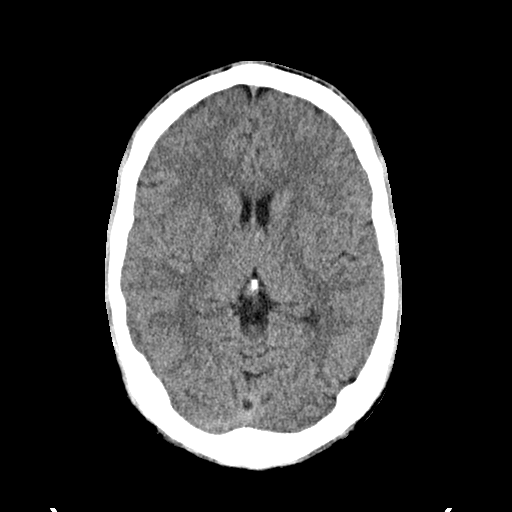
[im 18/32  brain]
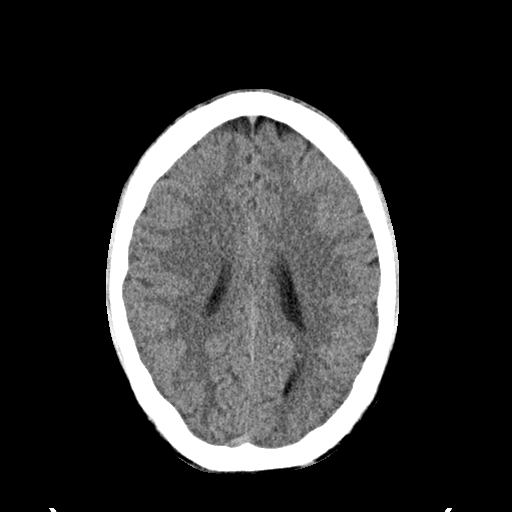
[im 18/32  bone]
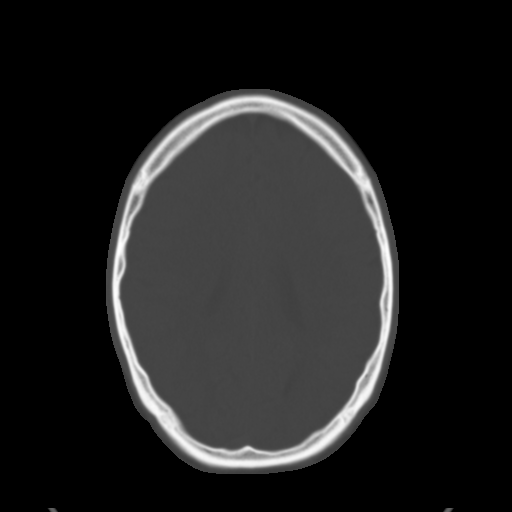
[im 21/32  brain]
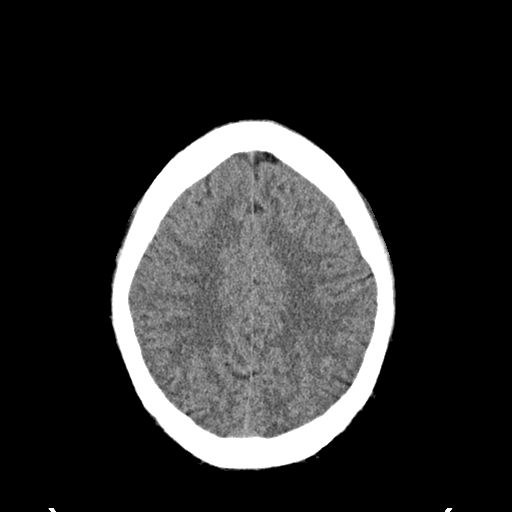
[im 25/32  brain]
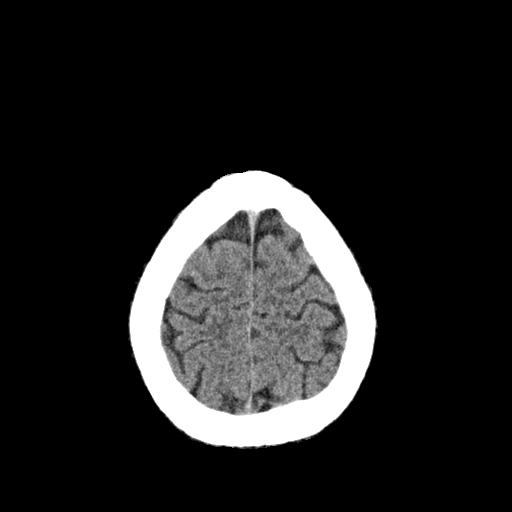
[im 28/32  brain]
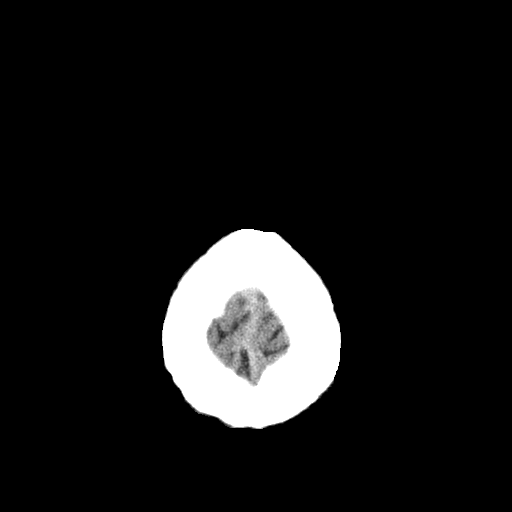

[Series 4: head bone · axial · 0.47mm/px · z∈[-101,+21]mm · 8 of 63 slices shown]
[im 7/63  bone]
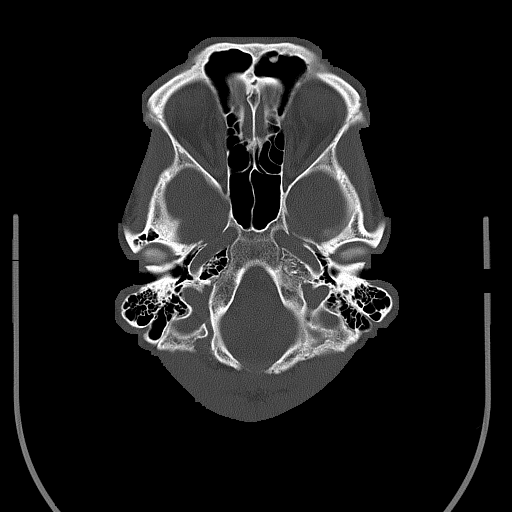
[im 14/63  bone]
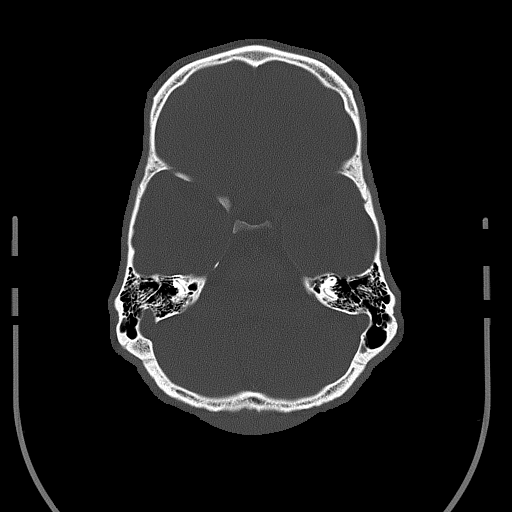
[im 20/63  bone]
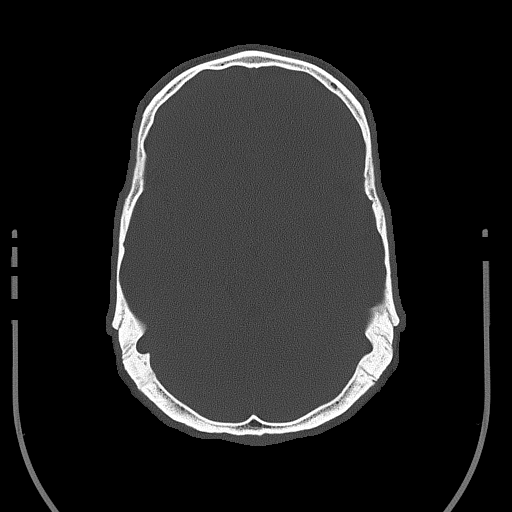
[im 27/63  bone]
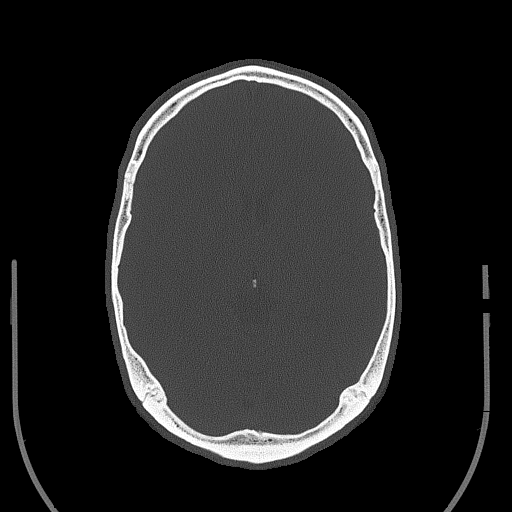
[im 36/63  bone]
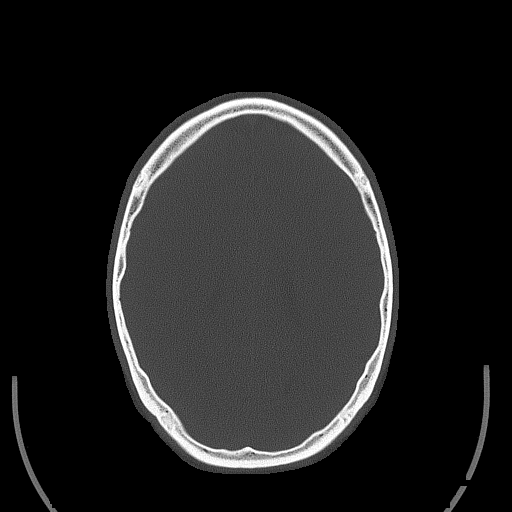
[im 43/63  bone]
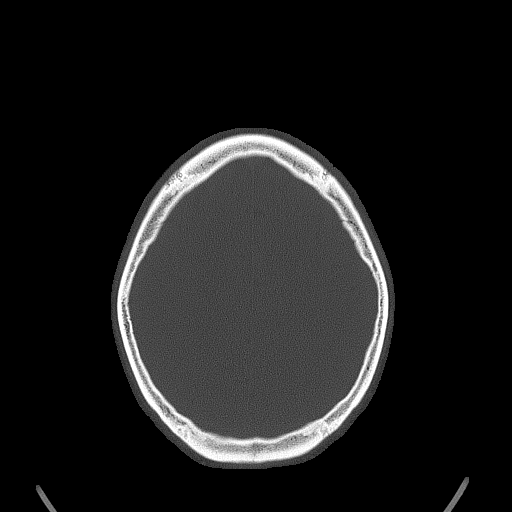
[im 49/63  bone]
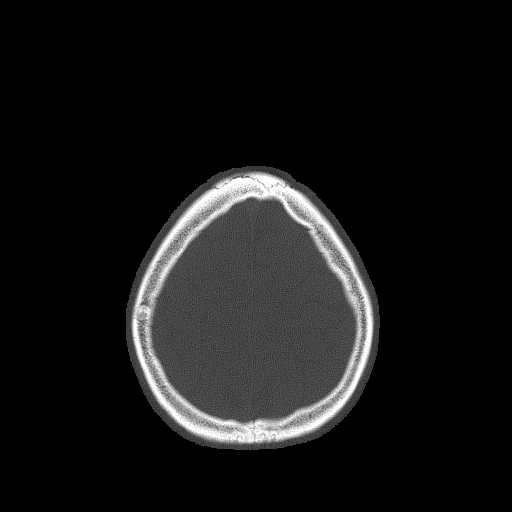
[im 56/63  bone]
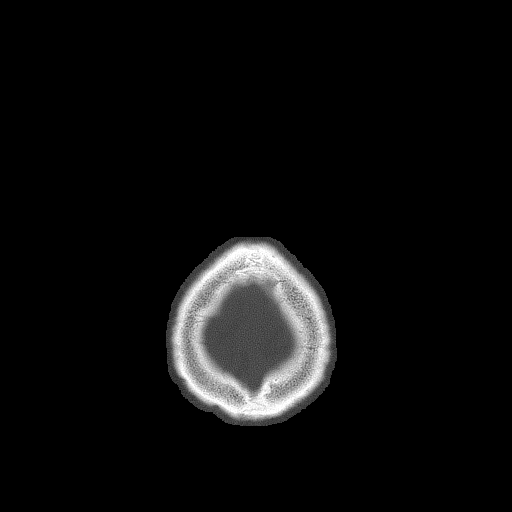

[16 of 30 positions shown; findings below may reference images not displayed]

FINDINGS: There is no acute intracranial hemorrhage or infarct. No mass lesion
or midline shift. Gray-white matter differentiation is well
maintained. Ventricles are normal in size without evidence of
hydrocephalus. CSF containing spaces are within normal limits. No
extra-axial fluid collection.

The calvarium is intact.

Orbital soft tissues are within normal limits.

The paranasal sinuses and mastoid air cells are well pneumatized and
free of fluid.

Scalp soft tissues are unremarkable.
IMPRESSION: Normal head CT.  No acute intracranial process identified.

## 2017-08-05 MED ORDER — CEPHALEXIN 500 MG PO CAP
500 mg | ORAL_CAPSULE | Freq: Four times a day (QID) | ORAL | 0 refills | Status: AC
Start: 2017-08-05 — End: ?
  Filled 2017-08-05 (×2): qty 8, 2d supply, fill #1

## 2017-08-05 MED ORDER — SENNOSIDES-DOCUSATE SODIUM 8.6-50 MG PO TAB
1 | Freq: Two times a day (BID) | ORAL | 0 refills | Status: AC
Start: 2017-08-05 — End: 2017-09-01

## 2017-08-05 MED ORDER — OXYCODONE 5 MG PO TAB
5-10 mg | ORAL_TABLET | ORAL | 0 refills | 6.00000 days | Status: AC | PRN
Start: 2017-08-05 — End: 2017-09-01
  Filled 2017-08-05 (×2): qty 54, 5d supply, fill #1

## 2017-08-05 MED ORDER — FENTANYL CITRATE (PF) 50 MCG/ML IJ SOLN
50 ug | INTRAVENOUS | 0 refills | Status: DC | PRN
Start: 2017-08-05 — End: 2017-08-05

## 2017-08-08 ENCOUNTER — Encounter: Admit: 2017-08-08 | Discharge: 2017-08-08 | Payer: MEDICARE

## 2017-08-08 DIAGNOSIS — T85618A Breakdown (mechanical) of other specified internal prosthetic devices, implants and grafts, initial encounter: ICD-10-CM

## 2017-08-08 DIAGNOSIS — F988 Other specified behavioral and emotional disorders with onset usually occurring in childhood and adolescence: ICD-10-CM

## 2017-08-08 DIAGNOSIS — R569 Unspecified convulsions: ICD-10-CM

## 2017-08-08 DIAGNOSIS — N319 Neuromuscular dysfunction of bladder, unspecified: ICD-10-CM

## 2017-08-08 DIAGNOSIS — N200 Calculus of kidney: ICD-10-CM

## 2017-08-08 DIAGNOSIS — G919 Hydrocephalus, unspecified: ICD-10-CM

## 2017-08-08 DIAGNOSIS — R51 Headache: ICD-10-CM

## 2017-08-08 DIAGNOSIS — G4733 Obstructive sleep apnea (adult) (pediatric): ICD-10-CM

## 2017-08-08 DIAGNOSIS — I10 Essential (primary) hypertension: ICD-10-CM

## 2017-08-08 DIAGNOSIS — Q059 Spina bifida, unspecified: ICD-10-CM

## 2017-08-08 DIAGNOSIS — I429 Cardiomyopathy, unspecified: ICD-10-CM

## 2017-08-08 DIAGNOSIS — N390 Urinary tract infection, site not specified: ICD-10-CM

## 2017-08-08 DIAGNOSIS — Q0700 Arnold-Chiari syndrome without spina bifida or hydrocephalus: ICD-10-CM

## 2017-08-09 ENCOUNTER — Encounter: Admit: 2017-08-09 | Discharge: 2017-08-09 | Payer: MEDICARE

## 2017-08-16 ENCOUNTER — Ambulatory Visit: Admit: 2017-08-16 | Discharge: 2017-08-17 | Payer: MEDICARE

## 2017-08-16 ENCOUNTER — Encounter: Admit: 2017-08-16 | Discharge: 2017-08-16 | Payer: MEDICARE

## 2017-08-16 DIAGNOSIS — Q059 Spina bifida, unspecified: Principal | ICD-10-CM

## 2017-08-16 DIAGNOSIS — T85618A Breakdown (mechanical) of other specified internal prosthetic devices, implants and grafts, initial encounter: ICD-10-CM

## 2017-08-16 DIAGNOSIS — N319 Neuromuscular dysfunction of bladder, unspecified: ICD-10-CM

## 2017-08-16 DIAGNOSIS — I10 Essential (primary) hypertension: ICD-10-CM

## 2017-08-16 DIAGNOSIS — F988 Other specified behavioral and emotional disorders with onset usually occurring in childhood and adolescence: ICD-10-CM

## 2017-08-16 DIAGNOSIS — I429 Cardiomyopathy, unspecified: ICD-10-CM

## 2017-08-16 DIAGNOSIS — G919 Hydrocephalus, unspecified: ICD-10-CM

## 2017-08-16 DIAGNOSIS — G4733 Obstructive sleep apnea (adult) (pediatric): ICD-10-CM

## 2017-08-16 DIAGNOSIS — Q0700 Arnold-Chiari syndrome without spina bifida or hydrocephalus: ICD-10-CM

## 2017-08-16 DIAGNOSIS — R569 Unspecified convulsions: ICD-10-CM

## 2017-08-16 DIAGNOSIS — N200 Calculus of kidney: ICD-10-CM

## 2017-08-16 DIAGNOSIS — N390 Urinary tract infection, site not specified: ICD-10-CM

## 2017-08-16 DIAGNOSIS — R51 Headache: ICD-10-CM

## 2017-08-16 MED ORDER — SPIRONOLACTONE 25 MG PO TAB
25 mg | ORAL_TABLET | Freq: Every day | ORAL | 3 refills | 46.00000 days | Status: AC
Start: 2017-08-16 — End: 2018-02-14

## 2017-08-16 MED ORDER — CARVEDILOL 6.25 MG PO TAB
12.5 mg | ORAL_TABLET | Freq: Two times a day (BID) | ORAL | 3 refills | 90.00000 days | Status: AC
Start: 2017-08-16 — End: 2018-02-14

## 2017-08-16 MED ORDER — LOSARTAN 25 MG PO TAB
25 mg | ORAL_TABLET | Freq: Every day | ORAL | 3 refills | 30.00000 days | Status: AC
Start: 2017-08-16 — End: 2018-02-14

## 2017-08-17 ENCOUNTER — Encounter: Admit: 2017-08-17 | Discharge: 2017-08-17 | Payer: MEDICARE

## 2017-08-17 DIAGNOSIS — I10 Essential (primary) hypertension: Secondary | ICD-10-CM

## 2017-08-17 DIAGNOSIS — Z982 Presence of cerebrospinal fluid drainage device: ICD-10-CM

## 2017-08-17 DIAGNOSIS — I428 Other cardiomyopathies: Secondary | ICD-10-CM

## 2017-08-17 MED ORDER — LEVETIRACETAM 500 MG PO TAB
500 mg | ORAL_TABLET | Freq: Two times a day (BID) | ORAL | 0 refills | 90.00000 days | Status: AC
Start: 2017-08-17 — End: 2018-07-05

## 2017-08-19 ENCOUNTER — Encounter: Admit: 2017-08-19 | Discharge: 2017-08-19 | Payer: MEDICARE

## 2017-08-19 ENCOUNTER — Ambulatory Visit: Admit: 2017-08-19 | Discharge: 2017-08-20 | Payer: MEDICARE

## 2017-08-19 DIAGNOSIS — R569 Unspecified convulsions: ICD-10-CM

## 2017-08-19 DIAGNOSIS — F988 Other specified behavioral and emotional disorders with onset usually occurring in childhood and adolescence: ICD-10-CM

## 2017-08-19 DIAGNOSIS — Q0700 Arnold-Chiari syndrome without spina bifida or hydrocephalus: ICD-10-CM

## 2017-08-19 DIAGNOSIS — N200 Calculus of kidney: ICD-10-CM

## 2017-08-19 DIAGNOSIS — N319 Neuromuscular dysfunction of bladder, unspecified: ICD-10-CM

## 2017-08-19 DIAGNOSIS — G919 Hydrocephalus, unspecified: ICD-10-CM

## 2017-08-19 DIAGNOSIS — N390 Urinary tract infection, site not specified: ICD-10-CM

## 2017-08-19 DIAGNOSIS — G4733 Obstructive sleep apnea (adult) (pediatric): ICD-10-CM

## 2017-08-19 DIAGNOSIS — I429 Cardiomyopathy, unspecified: ICD-10-CM

## 2017-08-19 DIAGNOSIS — R51 Headache: ICD-10-CM

## 2017-08-19 DIAGNOSIS — T85618A Breakdown (mechanical) of other specified internal prosthetic devices, implants and grafts, initial encounter: ICD-10-CM

## 2017-08-19 DIAGNOSIS — Q059 Spina bifida, unspecified: ICD-10-CM

## 2017-08-19 DIAGNOSIS — I10 Essential (primary) hypertension: ICD-10-CM

## 2017-08-19 MED ORDER — IMIPRAMINE HCL 25 MG PO TAB
ORAL_TABLET | ORAL | 0 refills | Status: AC
Start: 2017-08-19 — End: 2017-08-31

## 2017-08-20 DIAGNOSIS — Z4802 Encounter for removal of sutures: Principal | ICD-10-CM

## 2017-08-24 ENCOUNTER — Ambulatory Visit: Admit: 2017-08-24 | Discharge: 2017-08-24 | Payer: MEDICARE

## 2017-08-24 DIAGNOSIS — Z982 Presence of cerebrospinal fluid drainage device: Principal | ICD-10-CM

## 2017-08-31 ENCOUNTER — Ambulatory Visit: Admit: 2017-08-31 | Discharge: 2017-08-31 | Payer: MEDICARE

## 2017-08-31 ENCOUNTER — Encounter: Admit: 2017-08-31 | Discharge: 2017-08-31 | Payer: MEDICARE

## 2017-08-31 DIAGNOSIS — N200 Calculus of kidney: ICD-10-CM

## 2017-08-31 DIAGNOSIS — R51 Headache: ICD-10-CM

## 2017-08-31 DIAGNOSIS — R569 Unspecified convulsions: ICD-10-CM

## 2017-08-31 DIAGNOSIS — J209 Acute bronchitis, unspecified: ICD-10-CM

## 2017-08-31 DIAGNOSIS — Q059 Spina bifida, unspecified: Principal | ICD-10-CM

## 2017-08-31 DIAGNOSIS — I10 Essential (primary) hypertension: ICD-10-CM

## 2017-08-31 DIAGNOSIS — R05 Cough: Principal | ICD-10-CM

## 2017-08-31 DIAGNOSIS — I429 Cardiomyopathy, unspecified: ICD-10-CM

## 2017-08-31 DIAGNOSIS — F988 Other specified behavioral and emotional disorders with onset usually occurring in childhood and adolescence: ICD-10-CM

## 2017-08-31 DIAGNOSIS — G4733 Obstructive sleep apnea (adult) (pediatric): ICD-10-CM

## 2017-08-31 DIAGNOSIS — Q0700 Arnold-Chiari syndrome without spina bifida or hydrocephalus: ICD-10-CM

## 2017-08-31 DIAGNOSIS — N319 Neuromuscular dysfunction of bladder, unspecified: ICD-10-CM

## 2017-08-31 DIAGNOSIS — N390 Urinary tract infection, site not specified: ICD-10-CM

## 2017-08-31 DIAGNOSIS — G919 Hydrocephalus, unspecified: ICD-10-CM

## 2017-08-31 DIAGNOSIS — T85618A Breakdown (mechanical) of other specified internal prosthetic devices, implants and grafts, initial encounter: ICD-10-CM

## 2017-08-31 MED ORDER — AZITHROMYCIN 250 MG PO TAB
ORAL_TABLET | Freq: Every day | ORAL | 0 refills | Status: AC
Start: 2017-08-31 — End: ?

## 2017-08-31 MED ORDER — IPRATROPIUM-ALBUTEROL 0.5 MG-3 MG(2.5 MG BASE)/3 ML IN NEBU
3 mL | Freq: Once | RESPIRATORY_TRACT | 0 refills | Status: AC
Start: 2017-08-31 — End: ?

## 2017-09-01 ENCOUNTER — Ambulatory Visit: Admit: 2017-09-01 | Discharge: 2017-09-01 | Payer: MEDICARE

## 2017-09-01 ENCOUNTER — Encounter: Admit: 2017-09-01 | Discharge: 2017-09-01 | Payer: MEDICARE

## 2017-09-01 DIAGNOSIS — N200 Calculus of kidney: Principal | ICD-10-CM

## 2017-09-01 DIAGNOSIS — N390 Urinary tract infection, site not specified: ICD-10-CM

## 2017-09-01 DIAGNOSIS — Q0700 Arnold-Chiari syndrome without spina bifida or hydrocephalus: ICD-10-CM

## 2017-09-01 DIAGNOSIS — I429 Cardiomyopathy, unspecified: ICD-10-CM

## 2017-09-01 DIAGNOSIS — R51 Headache: ICD-10-CM

## 2017-09-01 DIAGNOSIS — R569 Unspecified convulsions: ICD-10-CM

## 2017-09-01 DIAGNOSIS — T85618A Breakdown (mechanical) of other specified internal prosthetic devices, implants and grafts, initial encounter: ICD-10-CM

## 2017-09-01 DIAGNOSIS — G4733 Obstructive sleep apnea (adult) (pediatric): ICD-10-CM

## 2017-09-01 DIAGNOSIS — G919 Hydrocephalus, unspecified: ICD-10-CM

## 2017-09-01 DIAGNOSIS — F988 Other specified behavioral and emotional disorders with onset usually occurring in childhood and adolescence: ICD-10-CM

## 2017-09-01 DIAGNOSIS — N319 Neuromuscular dysfunction of bladder, unspecified: ICD-10-CM

## 2017-09-01 DIAGNOSIS — I10 Essential (primary) hypertension: ICD-10-CM

## 2017-09-01 DIAGNOSIS — Q059 Spina bifida, unspecified: Secondary | ICD-10-CM

## 2017-09-01 MED ORDER — OSTOMY SUPPLIES 1 " MISC MISC
10 refills | Status: AC | PRN
Start: 2017-09-01 — End: 2019-07-11

## 2017-09-03 ENCOUNTER — Encounter: Admit: 2017-09-03 | Discharge: 2017-09-03 | Payer: MEDICARE

## 2017-09-03 DIAGNOSIS — T85618A Breakdown (mechanical) of other specified internal prosthetic devices, implants and grafts, initial encounter: ICD-10-CM

## 2017-09-03 DIAGNOSIS — Q0700 Arnold-Chiari syndrome without spina bifida or hydrocephalus: ICD-10-CM

## 2017-09-03 DIAGNOSIS — N200 Calculus of kidney: ICD-10-CM

## 2017-09-03 DIAGNOSIS — R51 Headache: ICD-10-CM

## 2017-09-03 DIAGNOSIS — N319 Neuromuscular dysfunction of bladder, unspecified: ICD-10-CM

## 2017-09-03 DIAGNOSIS — G919 Hydrocephalus, unspecified: ICD-10-CM

## 2017-09-03 DIAGNOSIS — Q059 Spina bifida, unspecified: ICD-10-CM

## 2017-09-03 DIAGNOSIS — I10 Essential (primary) hypertension: ICD-10-CM

## 2017-09-03 DIAGNOSIS — I429 Cardiomyopathy, unspecified: ICD-10-CM

## 2017-09-03 DIAGNOSIS — N390 Urinary tract infection, site not specified: ICD-10-CM

## 2017-09-03 DIAGNOSIS — F988 Other specified behavioral and emotional disorders with onset usually occurring in childhood and adolescence: ICD-10-CM

## 2017-09-03 DIAGNOSIS — G4733 Obstructive sleep apnea (adult) (pediatric): ICD-10-CM

## 2017-09-03 DIAGNOSIS — R569 Unspecified convulsions: ICD-10-CM

## 2017-09-07 ENCOUNTER — Ambulatory Visit: Admit: 2017-09-07 | Discharge: 2017-09-08 | Payer: MEDICARE

## 2017-09-07 ENCOUNTER — Encounter: Admit: 2017-09-07 | Discharge: 2017-09-07 | Payer: MEDICARE

## 2017-09-07 DIAGNOSIS — I429 Cardiomyopathy, unspecified: ICD-10-CM

## 2017-09-07 DIAGNOSIS — R569 Unspecified convulsions: ICD-10-CM

## 2017-09-07 DIAGNOSIS — G4733 Obstructive sleep apnea (adult) (pediatric): ICD-10-CM

## 2017-09-07 DIAGNOSIS — G919 Hydrocephalus, unspecified: ICD-10-CM

## 2017-09-07 DIAGNOSIS — N319 Neuromuscular dysfunction of bladder, unspecified: ICD-10-CM

## 2017-09-07 DIAGNOSIS — N200 Calculus of kidney: ICD-10-CM

## 2017-09-07 DIAGNOSIS — Q059 Spina bifida, unspecified: Secondary | ICD-10-CM

## 2017-09-07 DIAGNOSIS — R51 Headache: ICD-10-CM

## 2017-09-07 DIAGNOSIS — F988 Other specified behavioral and emotional disorders with onset usually occurring in childhood and adolescence: ICD-10-CM

## 2017-09-07 DIAGNOSIS — Q0700 Arnold-Chiari syndrome without spina bifida or hydrocephalus: ICD-10-CM

## 2017-09-07 DIAGNOSIS — N390 Urinary tract infection, site not specified: ICD-10-CM

## 2017-09-07 DIAGNOSIS — T85618A Breakdown (mechanical) of other specified internal prosthetic devices, implants and grafts, initial encounter: ICD-10-CM

## 2017-09-07 DIAGNOSIS — I10 Essential (primary) hypertension: ICD-10-CM

## 2017-09-08 DIAGNOSIS — T85618D Breakdown (mechanical) of other specified internal prosthetic devices, implants and grafts, subsequent encounter: Principal | ICD-10-CM

## 2017-09-15 ENCOUNTER — Encounter: Admit: 2017-09-15 | Discharge: 2017-09-15 | Payer: MEDICARE

## 2017-09-15 MED ORDER — LEVETIRACETAM 500 MG PO TAB
500 mg | ORAL_TABLET | Freq: Two times a day (BID) | ORAL | 2 refills | 90.00000 days | Status: AC
Start: 2017-09-15 — End: 2017-09-27

## 2017-09-15 MED ORDER — CARVEDILOL 12.5 MG PO TAB
ORAL_TABLET | Freq: Two times a day (BID) | 0 refills
Start: 2017-09-15 — End: ?

## 2017-09-20 MED ORDER — IPRATROPIUM-ALBUTEROL 0.5 MG-3 MG(2.5 MG BASE)/3 ML IN NEBU
3 mL | Freq: Once | RESPIRATORY_TRACT | 0 refills | Status: CP
Start: 2017-09-20 — End: ?
  Administered 2017-08-31: 19:00:00 3 mL via RESPIRATORY_TRACT

## 2017-09-27 ENCOUNTER — Ambulatory Visit: Admit: 2017-09-27 | Discharge: 2017-09-28 | Payer: MEDICARE

## 2017-09-27 ENCOUNTER — Encounter: Admit: 2017-09-27 | Discharge: 2017-09-27 | Payer: MEDICARE

## 2017-09-27 DIAGNOSIS — R51 Headache: ICD-10-CM

## 2017-09-27 DIAGNOSIS — N390 Urinary tract infection, site not specified: ICD-10-CM

## 2017-09-27 DIAGNOSIS — R569 Unspecified convulsions: ICD-10-CM

## 2017-09-27 DIAGNOSIS — R339 Retention of urine, unspecified: ICD-10-CM

## 2017-09-27 DIAGNOSIS — I429 Cardiomyopathy, unspecified: ICD-10-CM

## 2017-09-27 DIAGNOSIS — N319 Neuromuscular dysfunction of bladder, unspecified: ICD-10-CM

## 2017-09-27 DIAGNOSIS — Z01810 Encounter for preprocedural cardiovascular examination: ICD-10-CM

## 2017-09-27 DIAGNOSIS — Q0700 Arnold-Chiari syndrome without spina bifida or hydrocephalus: ICD-10-CM

## 2017-09-27 DIAGNOSIS — F988 Other specified behavioral and emotional disorders with onset usually occurring in childhood and adolescence: ICD-10-CM

## 2017-09-27 DIAGNOSIS — R000 Tachycardia, unspecified: ICD-10-CM

## 2017-09-27 DIAGNOSIS — N200 Calculus of kidney: ICD-10-CM

## 2017-09-27 DIAGNOSIS — G919 Hydrocephalus, unspecified: ICD-10-CM

## 2017-09-27 DIAGNOSIS — T85618A Breakdown (mechanical) of other specified internal prosthetic devices, implants and grafts, initial encounter: ICD-10-CM

## 2017-09-27 DIAGNOSIS — Q059 Spina bifida, unspecified: Principal | ICD-10-CM

## 2017-09-27 DIAGNOSIS — G4733 Obstructive sleep apnea (adult) (pediatric): ICD-10-CM

## 2017-09-27 DIAGNOSIS — I10 Essential (primary) hypertension: ICD-10-CM

## 2017-09-28 DIAGNOSIS — Z7689 Persons encountering health services in other specified circumstances: ICD-10-CM

## 2017-09-28 DIAGNOSIS — I10 Essential (primary) hypertension: Secondary | ICD-10-CM

## 2017-10-03 ENCOUNTER — Encounter: Admit: 2017-10-03 | Discharge: 2017-10-03 | Payer: MEDICARE

## 2017-10-11 ENCOUNTER — Encounter: Admit: 2017-10-11 | Discharge: 2017-10-11 | Payer: MEDICARE

## 2017-10-11 MED ORDER — CARVEDILOL 6.25 MG PO TAB
ORAL_TABLET | Freq: Two times a day (BID) | 0 refills
Start: 2017-10-11 — End: ?

## 2017-10-11 MED ORDER — LISINOPRIL 5 MG PO TAB
ORAL_TABLET | Freq: Every day | 0 refills
Start: 2017-10-11 — End: ?

## 2017-10-26 ENCOUNTER — Encounter: Admit: 2017-10-26 | Discharge: 2017-10-26 | Payer: MEDICARE

## 2017-11-02 ENCOUNTER — Encounter: Admit: 2017-11-02 | Discharge: 2017-11-02 | Payer: MEDICARE

## 2017-11-02 ENCOUNTER — Emergency Department: Admit: 2017-11-02 | Discharge: 2017-11-02 | Payer: MEDICARE

## 2017-11-02 ENCOUNTER — Emergency Department: Admit: 2017-11-02 | Discharge: 2017-11-02 | Disposition: A | Discharge status: Home / Self Care | Payer: MEDICARE

## 2017-11-02 ENCOUNTER — Ambulatory Visit: Admit: 2017-11-02 | Discharge: 2017-11-03 | Payer: MEDICARE

## 2017-11-02 DIAGNOSIS — N12 Tubulo-interstitial nephritis, not specified as acute or chronic: Principal | ICD-10-CM

## 2017-11-02 LAB — URINALYSIS DIPSTICK REFLEX TO CULTURE
Lab: 8 mg/dL — ABNORMAL LOW (ref 5.0–8.0)
Lab: NEGATIVE % (ref 24–44)
Lab: NEGATIVE K/UL (ref 3–12)
Lab: POSITIVE % — AB (ref 0–5)

## 2017-11-02 LAB — LIPASE: Lab: 22 U/L — ABNORMAL LOW (ref 11–82)

## 2017-11-02 LAB — POC TROPONIN

## 2017-11-02 LAB — COMPREHENSIVE METABOLIC PANEL
Lab: 140 MMOL/L (ref 137–147)
Lab: 18 U/L (ref 7–40)
Lab: >60 mL/min (ref >60)

## 2017-11-02 LAB — URINALYSIS MICROSCOPIC REFLEX TO CULTURE

## 2017-11-02 LAB — CBC AND DIFF: Lab: 5.5 10*3/uL (ref 4.5–11.0)

## 2017-11-02 MED ORDER — CEFPODOXIME 100 MG PO TAB
200 mg | Freq: Once | ORAL | 0 refills | Status: CP
Start: 2017-11-02 — End: ?
  Administered 2017-11-02: 18:00:00 200 mg via ORAL

## 2017-11-02 MED ORDER — CEFPODOXIME 200 MG PO TAB
200 mg | ORAL_TABLET | Freq: Two times a day (BID) | ORAL | 0 refills | 7.00000 days | Status: AC
Start: 2017-11-02 — End: 2017-11-02

## 2017-11-02 MED ORDER — CEFPODOXIME 200 MG PO TAB
200 mg | ORAL_TABLET | Freq: Two times a day (BID) | ORAL | 0 refills | 7.00000 days | Status: AC
Start: 2017-11-02 — End: ?

## 2017-11-03 ENCOUNTER — Encounter: Admit: 2017-11-03 | Discharge: 2017-11-03 | Payer: MEDICARE

## 2017-11-04 LAB — CULTURE-URINE W/SENSITIVITY: Lab: >10 U/L — AB (ref 25–110)

## 2017-11-05 ENCOUNTER — Encounter: Admit: 2017-11-05 | Discharge: 2017-11-05 | Payer: MEDICARE

## 2017-11-08 ENCOUNTER — Encounter: Admit: 2017-11-08 | Discharge: 2017-11-08 | Payer: MEDICARE

## 2017-11-08 ENCOUNTER — Ambulatory Visit: Admit: 2017-11-08 | Discharge: 2017-11-09 | Payer: MEDICARE

## 2017-11-08 DIAGNOSIS — I429 Cardiomyopathy, unspecified: ICD-10-CM

## 2017-11-08 DIAGNOSIS — R569 Unspecified convulsions: ICD-10-CM

## 2017-11-08 DIAGNOSIS — F988 Other specified behavioral and emotional disorders with onset usually occurring in childhood and adolescence: ICD-10-CM

## 2017-11-08 DIAGNOSIS — T85618A Breakdown (mechanical) of other specified internal prosthetic devices, implants and grafts, initial encounter: ICD-10-CM

## 2017-11-08 DIAGNOSIS — I10 Essential (primary) hypertension: ICD-10-CM

## 2017-11-08 DIAGNOSIS — Z09 Encounter for follow-up examination after completed treatment for conditions other than malignant neoplasm: Secondary | ICD-10-CM

## 2017-11-08 DIAGNOSIS — G4733 Obstructive sleep apnea (adult) (pediatric): ICD-10-CM

## 2017-11-08 DIAGNOSIS — N390 Urinary tract infection, site not specified: ICD-10-CM

## 2017-11-08 DIAGNOSIS — Q059 Spina bifida, unspecified: Principal | ICD-10-CM

## 2017-11-08 DIAGNOSIS — N200 Calculus of kidney: ICD-10-CM

## 2017-11-08 DIAGNOSIS — G919 Hydrocephalus, unspecified: ICD-10-CM

## 2017-11-08 DIAGNOSIS — R000 Tachycardia, unspecified: ICD-10-CM

## 2017-11-08 DIAGNOSIS — Q0700 Arnold-Chiari syndrome without spina bifida or hydrocephalus: ICD-10-CM

## 2017-11-08 DIAGNOSIS — R51 Headache: ICD-10-CM

## 2017-11-08 DIAGNOSIS — Z01810 Encounter for preprocedural cardiovascular examination: ICD-10-CM

## 2017-11-08 DIAGNOSIS — N319 Neuromuscular dysfunction of bladder, unspecified: ICD-10-CM

## 2017-11-08 DIAGNOSIS — R339 Retention of urine, unspecified: ICD-10-CM

## 2017-11-09 DIAGNOSIS — N390 Urinary tract infection, site not specified: Principal | ICD-10-CM

## 2017-11-10 ENCOUNTER — Encounter: Admit: 2017-11-10 | Discharge: 2017-11-10 | Payer: MEDICARE

## 2017-11-10 DIAGNOSIS — R000 Tachycardia, unspecified: ICD-10-CM

## 2017-11-10 DIAGNOSIS — Q059 Spina bifida, unspecified: Principal | ICD-10-CM

## 2017-11-10 DIAGNOSIS — Z01810 Encounter for preprocedural cardiovascular examination: ICD-10-CM

## 2017-11-10 DIAGNOSIS — R339 Retention of urine, unspecified: ICD-10-CM

## 2017-11-10 DIAGNOSIS — I10 Essential (primary) hypertension: ICD-10-CM

## 2017-11-10 DIAGNOSIS — Q0700 Arnold-Chiari syndrome without spina bifida or hydrocephalus: ICD-10-CM

## 2017-11-10 DIAGNOSIS — I429 Cardiomyopathy, unspecified: ICD-10-CM

## 2017-11-10 DIAGNOSIS — N319 Neuromuscular dysfunction of bladder, unspecified: ICD-10-CM

## 2017-11-10 DIAGNOSIS — N200 Calculus of kidney: ICD-10-CM

## 2017-11-10 DIAGNOSIS — G919 Hydrocephalus, unspecified: ICD-10-CM

## 2017-11-10 DIAGNOSIS — R51 Headache: ICD-10-CM

## 2017-11-10 DIAGNOSIS — F988 Other specified behavioral and emotional disorders with onset usually occurring in childhood and adolescence: ICD-10-CM

## 2017-11-10 DIAGNOSIS — N390 Urinary tract infection, site not specified: ICD-10-CM

## 2017-11-10 DIAGNOSIS — G4733 Obstructive sleep apnea (adult) (pediatric): ICD-10-CM

## 2017-11-10 DIAGNOSIS — T85618A Breakdown (mechanical) of other specified internal prosthetic devices, implants and grafts, initial encounter: ICD-10-CM

## 2017-11-10 DIAGNOSIS — R569 Unspecified convulsions: ICD-10-CM

## 2017-11-11 ENCOUNTER — Ambulatory Visit: Admit: 2017-11-10 | Discharge: 2017-11-11 | Payer: MEDICARE

## 2017-11-11 DIAGNOSIS — Q052 Lumbar spina bifida with hydrocephalus: ICD-10-CM

## 2017-11-11 DIAGNOSIS — I428 Other cardiomyopathies: Principal | ICD-10-CM

## 2017-11-11 DIAGNOSIS — I10 Essential (primary) hypertension: Secondary | ICD-10-CM

## 2017-11-12 ENCOUNTER — Encounter: Admit: 2017-11-12 | Discharge: 2017-11-12 | Payer: MEDICARE

## 2017-11-13 MED ORDER — IMIPRAMINE HCL 25 MG PO TAB
ORAL_TABLET | Freq: Every morning | ORAL | 0 refills | Status: AC
Start: 2017-11-13 — End: 2018-02-07

## 2017-12-28 ENCOUNTER — Encounter: Admit: 2017-12-28 | Discharge: 2017-12-28 | Payer: MEDICARE

## 2018-01-13 ENCOUNTER — Encounter: Admit: 2018-01-13 | Discharge: 2018-01-13 | Payer: MEDICARE

## 2018-01-13 MED ORDER — LEVETIRACETAM 500 MG PO TAB
ORAL_TABLET | Freq: Two times a day (BID) | 0 refills
Start: 2018-01-13 — End: ?

## 2018-01-18 ENCOUNTER — Emergency Department
Admit: 2018-01-18 | Discharge: 2018-01-18 | Payer: MEDICARE | Attending: Student in an Organized Health Care Education/Training Program

## 2018-01-18 ENCOUNTER — Emergency Department
Admit: 2018-01-18 | Discharge: 2018-01-18 | Disposition: A | Discharge status: Home / Self Care | Payer: MEDICARE | Attending: Student in an Organized Health Care Education/Training Program

## 2018-01-18 ENCOUNTER — Encounter: Admit: 2018-01-18 | Discharge: 2018-01-18 | Payer: MEDICARE

## 2018-01-18 DIAGNOSIS — Z01810 Encounter for preprocedural cardiovascular examination: Secondary | ICD-10-CM

## 2018-01-18 DIAGNOSIS — R0981 Nasal congestion: Secondary | ICD-10-CM

## 2018-01-18 DIAGNOSIS — R05 Cough: Secondary | ICD-10-CM

## 2018-01-18 DIAGNOSIS — N319 Neuromuscular dysfunction of bladder, unspecified: Secondary | ICD-10-CM

## 2018-01-18 DIAGNOSIS — R51 Headache: Secondary | ICD-10-CM

## 2018-01-18 DIAGNOSIS — G919 Hydrocephalus, unspecified: Secondary | ICD-10-CM

## 2018-01-18 DIAGNOSIS — B349 Viral infection, unspecified: Secondary | ICD-10-CM

## 2018-01-18 DIAGNOSIS — R509 Fever, unspecified: Secondary | ICD-10-CM

## 2018-01-18 DIAGNOSIS — Q059 Spina bifida, unspecified: Secondary | ICD-10-CM

## 2018-01-18 DIAGNOSIS — Z982 Presence of cerebrospinal fluid drainage device: Secondary | ICD-10-CM

## 2018-01-18 DIAGNOSIS — F988 Other specified behavioral and emotional disorders with onset usually occurring in childhood and adolescence: Secondary | ICD-10-CM

## 2018-01-18 DIAGNOSIS — I429 Cardiomyopathy, unspecified: Secondary | ICD-10-CM

## 2018-01-18 DIAGNOSIS — R339 Retention of urine, unspecified: Secondary | ICD-10-CM

## 2018-01-18 DIAGNOSIS — I10 Essential (primary) hypertension: Secondary | ICD-10-CM

## 2018-01-18 DIAGNOSIS — G4733 Obstructive sleep apnea (adult) (pediatric): Secondary | ICD-10-CM

## 2018-01-18 DIAGNOSIS — R000 Tachycardia, unspecified: Secondary | ICD-10-CM

## 2018-01-18 DIAGNOSIS — N200 Calculus of kidney: Secondary | ICD-10-CM

## 2018-01-18 DIAGNOSIS — T85618A Breakdown (mechanical) of other specified internal prosthetic devices, implants and grafts, initial encounter: Secondary | ICD-10-CM

## 2018-01-18 DIAGNOSIS — N390 Urinary tract infection, site not specified: Secondary | ICD-10-CM

## 2018-01-18 DIAGNOSIS — Q0700 Arnold-Chiari syndrome without spina bifida or hydrocephalus: Secondary | ICD-10-CM

## 2018-01-18 DIAGNOSIS — R569 Unspecified convulsions: Secondary | ICD-10-CM

## 2018-01-18 LAB — URINALYSIS DIPSTICK
Lab: NEGATIVE U/L (ref 7–40)
Lab: NEGATIVE U/L (ref 7–56)
Lab: NEGATIVE g/dL (ref 6.0–8.0)
Lab: NEGATIVE mg/dL (ref 0.3–1.2)

## 2018-01-18 LAB — INFLUENZA A/B AND RSV PCR
Lab: NEGATIVE mg/dL — AB (ref 0–5)
Lab: NEGATIVE mg/dL — ABNORMAL HIGH (ref 8.5–10.6)
Lab: NEGATIVE mg/dL — ABNORMAL LOW (ref 5.0–8.0)

## 2018-01-18 LAB — CBC AND DIFF
Lab: 0 10*3/uL (ref 0–0.20)
Lab: 0 10*3/uL (ref 0–0.45)
Lab: 12 10*3/uL — ABNORMAL HIGH (ref 4.5–11.0)

## 2018-01-18 LAB — COMPREHENSIVE METABOLIC PANEL
Lab: 10 10*3/uL — ABNORMAL HIGH (ref 3–12)
Lab: 135 MMOL/L — ABNORMAL LOW (ref 137–147)
Lab: >60 mL/min (ref >60)
Lab: >60 mL/min — ABNORMAL HIGH (ref >60)

## 2018-01-18 LAB — POC LACTATE: Lab: 0.7 MMOL/L (ref 0.5–2.0)

## 2018-01-18 LAB — URINALYSIS, MICROSCOPIC

## 2018-01-18 LAB — LACTIC ACID (BG - RAPID LACTATE): Lab: 1.1 MMOL/L (ref 0.5–2.0)

## 2018-01-18 MED ORDER — LACTATED RINGERS IV SOLP
500 mL | INTRAVENOUS | 0 refills | Status: CP
Start: 2018-01-18 — End: ?
  Administered 2018-01-18: 17:00:00 500 mL via INTRAVENOUS

## 2018-01-18 MED ORDER — LACTATED RINGERS IV SOLP
500 mL | INTRAVENOUS | 0 refills | Status: CP
Start: 2018-01-18 — End: ?
  Administered 2018-01-18: 20:00:00 500 mL via INTRAVENOUS

## 2018-01-18 MED ORDER — ACETAMINOPHEN 500 MG PO TAB
1000 mg | Freq: Once | ORAL | 0 refills | Status: CP
Start: 2018-01-18 — End: ?
  Administered 2018-01-18: 17:00:00 1000 mg via ORAL

## 2018-01-19 ENCOUNTER — Encounter: Admit: 2018-01-19 | Discharge: 2018-01-19 | Payer: MEDICARE

## 2018-01-24 ENCOUNTER — Encounter: Admit: 2018-01-24 | Discharge: 2018-01-24 | Payer: MEDICARE

## 2018-01-24 ENCOUNTER — Ambulatory Visit: Admit: 2018-01-24 | Discharge: 2018-01-25 | Payer: MEDICARE

## 2018-01-24 DIAGNOSIS — R51 Headache: Secondary | ICD-10-CM

## 2018-01-24 DIAGNOSIS — R339 Retention of urine, unspecified: Secondary | ICD-10-CM

## 2018-01-24 DIAGNOSIS — Q0700 Arnold-Chiari syndrome without spina bifida or hydrocephalus: Secondary | ICD-10-CM

## 2018-01-24 DIAGNOSIS — Z01810 Encounter for preprocedural cardiovascular examination: Secondary | ICD-10-CM

## 2018-01-24 DIAGNOSIS — F988 Other specified behavioral and emotional disorders with onset usually occurring in childhood and adolescence: Secondary | ICD-10-CM

## 2018-01-24 DIAGNOSIS — T85618A Breakdown (mechanical) of other specified internal prosthetic devices, implants and grafts, initial encounter: Secondary | ICD-10-CM

## 2018-01-24 DIAGNOSIS — N200 Calculus of kidney: Secondary | ICD-10-CM

## 2018-01-24 DIAGNOSIS — Q050 Cervical spina bifida with hydrocephalus: Principal | ICD-10-CM

## 2018-01-24 DIAGNOSIS — R000 Tachycardia, unspecified: Secondary | ICD-10-CM

## 2018-01-24 DIAGNOSIS — G4733 Obstructive sleep apnea (adult) (pediatric): Secondary | ICD-10-CM

## 2018-01-24 DIAGNOSIS — I429 Cardiomyopathy, unspecified: Secondary | ICD-10-CM

## 2018-01-24 DIAGNOSIS — N319 Neuromuscular dysfunction of bladder, unspecified: Secondary | ICD-10-CM

## 2018-01-24 DIAGNOSIS — N390 Urinary tract infection, site not specified: Secondary | ICD-10-CM

## 2018-01-24 DIAGNOSIS — R569 Unspecified convulsions: Secondary | ICD-10-CM

## 2018-01-24 DIAGNOSIS — Q059 Spina bifida, unspecified: Secondary | ICD-10-CM

## 2018-01-24 DIAGNOSIS — G919 Hydrocephalus, unspecified: Secondary | ICD-10-CM

## 2018-01-24 DIAGNOSIS — I10 Essential (primary) hypertension: Secondary | ICD-10-CM

## 2018-01-24 LAB — CULTURE-BLOOD W/SENSITIVITY

## 2018-01-25 DIAGNOSIS — R5081 Fever presenting with conditions classified elsewhere: Secondary | ICD-10-CM

## 2018-01-25 DIAGNOSIS — Z982 Presence of cerebrospinal fluid drainage device: Secondary | ICD-10-CM

## 2018-01-25 DIAGNOSIS — Z09 Encounter for follow-up examination after completed treatment for conditions other than malignant neoplasm: Secondary | ICD-10-CM

## 2018-02-07 ENCOUNTER — Encounter: Admit: 2018-02-07 | Discharge: 2018-02-07 | Payer: MEDICARE

## 2018-02-07 MED ORDER — IMIPRAMINE HCL 25 MG PO TAB
ORAL_TABLET | Freq: Every morning | ORAL | 0 refills | Status: AC
Start: 2018-02-07 — End: 2018-02-14

## 2018-02-11 ENCOUNTER — Encounter: Admit: 2018-02-11 | Discharge: 2018-02-11 | Payer: MEDICARE

## 2018-02-14 ENCOUNTER — Ambulatory Visit: Admit: 2018-02-14 | Discharge: 2018-02-15 | Payer: MEDICARE

## 2018-02-14 ENCOUNTER — Encounter: Admit: 2018-02-14 | Discharge: 2018-02-14 | Payer: MEDICARE

## 2018-02-14 DIAGNOSIS — G919 Hydrocephalus, unspecified: ICD-10-CM

## 2018-02-14 DIAGNOSIS — Z01810 Encounter for preprocedural cardiovascular examination: ICD-10-CM

## 2018-02-14 DIAGNOSIS — I10 Essential (primary) hypertension: ICD-10-CM

## 2018-02-14 DIAGNOSIS — R569 Unspecified convulsions: ICD-10-CM

## 2018-02-14 DIAGNOSIS — R339 Retention of urine, unspecified: ICD-10-CM

## 2018-02-14 DIAGNOSIS — Q0700 Arnold-Chiari syndrome without spina bifida or hydrocephalus: ICD-10-CM

## 2018-02-14 DIAGNOSIS — Q059 Spina bifida, unspecified: Secondary | ICD-10-CM

## 2018-02-14 DIAGNOSIS — R51 Headache: ICD-10-CM

## 2018-02-14 DIAGNOSIS — N319 Neuromuscular dysfunction of bladder, unspecified: ICD-10-CM

## 2018-02-14 DIAGNOSIS — N390 Urinary tract infection, site not specified: ICD-10-CM

## 2018-02-14 DIAGNOSIS — I429 Cardiomyopathy, unspecified: ICD-10-CM

## 2018-02-14 DIAGNOSIS — R000 Tachycardia, unspecified: ICD-10-CM

## 2018-02-14 DIAGNOSIS — N200 Calculus of kidney: ICD-10-CM

## 2018-02-14 DIAGNOSIS — G4733 Obstructive sleep apnea (adult) (pediatric): ICD-10-CM

## 2018-02-14 DIAGNOSIS — T85618A Breakdown (mechanical) of other specified internal prosthetic devices, implants and grafts, initial encounter: ICD-10-CM

## 2018-02-14 DIAGNOSIS — F988 Other specified behavioral and emotional disorders with onset usually occurring in childhood and adolescence: ICD-10-CM

## 2018-02-14 MED ORDER — CARVEDILOL 6.25 MG PO TAB
12.5 mg | ORAL_TABLET | Freq: Two times a day (BID) | ORAL | 3 refills | Status: SS
Start: 2018-02-14 — End: 2018-03-16

## 2018-02-14 MED ORDER — LOSARTAN 25 MG PO TAB
25 mg | ORAL_TABLET | Freq: Every day | ORAL | 3 refills | 30.00000 days | Status: AC
Start: 2018-02-14 — End: 2018-11-24

## 2018-02-14 MED ORDER — SPIRONOLACTONE 25 MG PO TAB
25 mg | ORAL_TABLET | Freq: Every day | ORAL | 3 refills | 90.00000 days | Status: AC
Start: 2018-02-14 — End: 2018-11-24

## 2018-02-14 NOTE — Patient Instructions
Recommendations:    1. No changes to medications.    2. Follow up in 6 months with a Heart Failure Nurse Practitioner.    3. Follow up in 1 year with Dr. [name]. Please have your ECHO completed at this visit as well.

## 2018-02-14 NOTE — Progress Notes
Pulse: 90   SpO2: 97%   Weight: 106.1 kg (233 lb 12.8 oz)   Height: 1.626 m (5' 4)   PainSc: Zero     Body mass index is 40.13 kg/m???.     Past Medical History  Patient Active Problem List    Diagnosis Date Noted   ??? Complex care coordination 09/14/2016     Priority: High   ??? Neurogenic bladder 12/16/2016   ??? 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 Non-ischemic cardiomyopathy (HCC) 09/29/2016   ??? Essential hypertension 07/30/2016   ??? Urethral stone 07/31/2015     Added automatically from request for surgery [id number]     ??? Morbid obesity (HCC) 05/19/2015   ??? Seizure disorder (HCC) 05/19/2015   ??? Transaminitis 05/19/2015   ??? Hydrocephalus (HCC) 04/28/2015   ??? 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 Arnold-Chiari malformation (HCC) 11/27/2009   ??? ADD (attention deficit disorder) 10/28/2006   ??? Spina bifida (HCC) 10/28/2006   ??? Unspecified constipation 10/28/2006         Review of Systems   Constitution: Negative.   HENT: Negative.    Eyes: Negative.    Cardiovascular: Negative.    Respiratory: Negative.    Endocrine: Negative.    Hematologic/Lymphatic: Negative.    Skin: Negative.    Musculoskeletal: Negative.    Gastrointestinal: Negative.    Genitourinary: Negative.    Neurological: Negative.    Psychiatric/Behavioral: Negative.    Allergic/Immunologic: Negative.    All other systems reviewed and are negative.      Physical Exam  Constitutional: He appears well-developed???and well-nourished.   HENT:   Head: Atraumatic.   Neck: Neck supple. No JVD???present.   Cardiovascular: Normal rate, regular rhythm???and normal heart sounds. ???Exam reveals no gallop???and no friction rub. ???  No murmur???heard.  Pulmonary/Chest: No respiratory distress. He has no wheezes. He has no rales.   Abdominal: There is no tenderness. There is no rebound.   Musculoskeletal: He exhibits deformity. He exhibits no edema???or tenderness.   Neurological: He is alert???and oriented to person, place, and time.   Skin: Skin is warm???and dry.

## 2018-02-15 DIAGNOSIS — Z982 Presence of cerebrospinal fluid drainage device: ICD-10-CM

## 2018-02-15 DIAGNOSIS — I10 Essential (primary) hypertension: ICD-10-CM

## 2018-02-15 DIAGNOSIS — I110 Hypertensive heart disease with heart failure: ICD-10-CM

## 2018-02-15 DIAGNOSIS — N319 Neuromuscular dysfunction of bladder, unspecified: ICD-10-CM

## 2018-02-15 DIAGNOSIS — F988 Other specified behavioral and emotional disorders with onset usually occurring in childhood and adolescence: ICD-10-CM

## 2018-02-15 DIAGNOSIS — I5089 Other heart failure: ICD-10-CM

## 2018-02-15 DIAGNOSIS — I428 Other cardiomyopathies: Principal | ICD-10-CM

## 2018-03-02 ENCOUNTER — Encounter: Admit: 2018-03-02 | Discharge: 2018-03-02 | Payer: MEDICARE

## 2018-03-10 ENCOUNTER — Encounter: Admit: 2018-03-10 | Discharge: 2018-03-10 | Payer: MEDICARE

## 2018-03-10 ENCOUNTER — Emergency Department: Admit: 2018-03-10 | Discharge: 2018-03-10 | Payer: MEDICARE

## 2018-03-10 DIAGNOSIS — Z01810 Encounter for preprocedural cardiovascular examination: ICD-10-CM

## 2018-03-10 DIAGNOSIS — R000 Tachycardia, unspecified: ICD-10-CM

## 2018-03-10 DIAGNOSIS — N200 Calculus of kidney: ICD-10-CM

## 2018-03-10 DIAGNOSIS — G919 Hydrocephalus, unspecified: ICD-10-CM

## 2018-03-10 DIAGNOSIS — Q059 Spina bifida, unspecified: Secondary | ICD-10-CM

## 2018-03-10 DIAGNOSIS — Q0700 Arnold-Chiari syndrome without spina bifida or hydrocephalus: ICD-10-CM

## 2018-03-10 DIAGNOSIS — N390 Urinary tract infection, site not specified: ICD-10-CM

## 2018-03-10 DIAGNOSIS — F988 Other specified behavioral and emotional disorders with onset usually occurring in childhood and adolescence: ICD-10-CM

## 2018-03-10 DIAGNOSIS — R51 Headache: ICD-10-CM

## 2018-03-10 DIAGNOSIS — G4733 Obstructive sleep apnea (adult) (pediatric): ICD-10-CM

## 2018-03-10 DIAGNOSIS — T85618A Breakdown (mechanical) of other specified internal prosthetic devices, implants and grafts, initial encounter: ICD-10-CM

## 2018-03-10 DIAGNOSIS — N319 Neuromuscular dysfunction of bladder, unspecified: ICD-10-CM

## 2018-03-10 DIAGNOSIS — B952 Enterococcus as the cause of diseases classified elsewhere: Secondary | ICD-10-CM

## 2018-03-10 DIAGNOSIS — I429 Cardiomyopathy, unspecified: ICD-10-CM

## 2018-03-10 DIAGNOSIS — R569 Unspecified convulsions: ICD-10-CM

## 2018-03-10 DIAGNOSIS — I10 Essential (primary) hypertension: ICD-10-CM

## 2018-03-10 DIAGNOSIS — R339 Retention of urine, unspecified: ICD-10-CM

## 2018-03-10 LAB — CBC AND DIFF
Lab: 0.1 10*3/uL (ref 0–0.20)
Lab: 0.1 10*3/uL (ref 0–0.45)
Lab: 11 10*3/uL — ABNORMAL HIGH (ref 4.5–11.0)

## 2018-03-10 LAB — URINALYSIS DIPSTICK
Lab: NEGATIVE U/L (ref 7–56)
Lab: NEGATIVE U/L — ABNORMAL LOW (ref 25–110)
Lab: NEGATIVE g/dL (ref 3.5–5.0)
Lab: NEGATIVE mL/min (ref 1.0–4.8)
Lab: NEGATIVE mg/dL (ref 0.3–1.2)

## 2018-03-10 LAB — URINALYSIS, MICROSCOPIC

## 2018-03-10 LAB — TROPONIN-I: Lab: 0 ng/mL (ref 0.0–0.05)

## 2018-03-10 LAB — COMPREHENSIVE METABOLIC PANEL
Lab: 109 MMOL/L — ABNORMAL LOW (ref 98–110)
Lab: 142 MMOL/L (ref 137–147)
Lab: >60 mL/min — ABNORMAL HIGH (ref >60)

## 2018-03-10 LAB — LIPASE: Lab: 14 U/L — ABNORMAL LOW (ref 11–82)

## 2018-03-10 MED ORDER — CYANOCOBALAMIN (VITAMIN B-12) 500 MCG PO TAB
1000 ug | Freq: Every day | ORAL | 0 refills | Status: DC
Start: 2018-03-10 — End: 2018-03-16
  Administered 2018-03-11 – 2018-03-16 (×6): 1000 ug via ORAL

## 2018-03-10 MED ORDER — CEFTRIAXONE INJ 1GM IVP
1 g | INTRAVENOUS | 0 refills | Status: DC
Start: 2018-03-10 — End: 2018-03-11
  Administered 2018-03-11: 14:00:00 1 g via INTRAVENOUS

## 2018-03-10 MED ORDER — CEFTRIAXONE INJ 1GM IVP
1 g | Freq: Once | INTRAVENOUS | 0 refills | Status: CP
Start: 2018-03-10 — End: ?
  Administered 2018-03-10: 19:00:00 1 g via INTRAVENOUS

## 2018-03-10 MED ORDER — ERTAPENEM 1GM IVP
1 g | INTRAVENOUS | 0 refills | Status: DC
Start: 2018-03-10 — End: 2018-03-10

## 2018-03-10 MED ORDER — LACTATED RINGERS IV SOLP
INTRAVENOUS | 0 refills | Status: AC
Start: 2018-03-10 — End: ?
  Administered 2018-03-10 – 2018-03-11 (×2): 1000.000 mL via INTRAVENOUS

## 2018-03-10 MED ORDER — LEVETIRACETAM 500 MG PO TAB
500 mg | Freq: Two times a day (BID) | ORAL | 0 refills | Status: DC
Start: 2018-03-10 — End: 2018-03-16
  Administered 2018-03-11 – 2018-03-16 (×12): 500 mg via ORAL

## 2018-03-10 MED ORDER — ACETAMINOPHEN 325 MG PO TAB
650 mg | ORAL | 0 refills | Status: DC | PRN
Start: 2018-03-10 — End: 2018-03-16
  Administered 2018-03-11 – 2018-03-14 (×8): 650 mg via ORAL

## 2018-03-10 MED ORDER — CHOLECALCIFEROL (VITAMIN D3) 25 MCG (1,000 UNIT) PO TAB
2000 [IU] | Freq: Every day | ORAL | 0 refills | Status: DC
Start: 2018-03-10 — End: 2018-03-16
  Administered 2018-03-11 – 2018-03-16 (×6): 2000 [IU] via ORAL

## 2018-03-10 MED ORDER — POLYETHYLENE GLYCOL 3350 17 GRAM PO PWPK
1 | Freq: Every day | ORAL | 0 refills | Status: DC | PRN
Start: 2018-03-10 — End: 2018-03-16

## 2018-03-10 MED ORDER — FENTANYL CITRATE (PF) 50 MCG/ML IJ SOLN
12.5-25 ug | INTRAVENOUS | 0 refills | Status: DC | PRN
Start: 2018-03-10 — End: 2018-03-13

## 2018-03-10 MED ORDER — MELATONIN 3 MG PO TAB
3 mg | Freq: Every evening | ORAL | 0 refills | Status: DC | PRN
Start: 2018-03-10 — End: 2018-03-16

## 2018-03-10 MED ORDER — OXYCODONE 5 MG PO TAB
5 mg | ORAL | 0 refills | Status: DC | PRN
Start: 2018-03-10 — End: 2018-03-15
  Administered 2018-03-10 – 2018-03-13 (×3): 5 mg via ORAL

## 2018-03-10 MED ORDER — HYOSCYAMINE SULFATE 0.125 MG PO TBDI
.125 mg | SUBLINGUAL | 0 refills | Status: DC | PRN
Start: 2018-03-10 — End: 2018-03-16

## 2018-03-10 MED ORDER — CARVEDILOL 6.25 MG PO TAB
6.25 mg | Freq: Two times a day (BID) | ORAL | 0 refills | Status: DC
Start: 2018-03-10 — End: 2018-03-16
  Administered 2018-03-11 – 2018-03-16 (×8): 6.25 mg via ORAL

## 2018-03-10 MED ORDER — FENTANYL CITRATE (PF) 50 MCG/ML IJ SOLN
25 ug | Freq: Once | INTRAVENOUS | 0 refills | Status: CP
Start: 2018-03-10 — End: ?
  Administered 2018-03-10: 17:00:00 25 ug via INTRAVENOUS

## 2018-03-10 MED ORDER — ONDANSETRON HCL 4 MG PO TAB
4 mg | ORAL | 0 refills | Status: DC | PRN
Start: 2018-03-10 — End: 2018-03-16

## 2018-03-10 MED ORDER — ENOXAPARIN 40 MG/0.4 ML SC SYRG
40 mg | Freq: Every day | SUBCUTANEOUS | 0 refills | Status: DC
Start: 2018-03-10 — End: 2018-03-16
  Administered 2018-03-11 – 2018-03-16 (×6): 40 mg via SUBCUTANEOUS

## 2018-03-10 MED ORDER — ONDANSETRON HCL (PF) 4 MG/2 ML IJ SOLN
4 mg | INTRAVENOUS | 0 refills | Status: DC | PRN
Start: 2018-03-10 — End: 2018-03-16

## 2018-03-10 MED ORDER — LACTATED RINGERS IV SOLP
1000 mL | INTRAVENOUS | 0 refills | Status: CP
Start: 2018-03-10 — End: ?
  Administered 2018-03-10: 17:00:00 1000 mL via INTRAVENOUS

## 2018-03-11 LAB — LACTIC ACID (BG - RAPID LACTATE): Lab: 1.6 MMOL/L (ref 0.5–2.0)

## 2018-03-11 LAB — INFLUENZA A/B AND RSV PCR
Lab: NEGATIVE
Lab: NEGATIVE
Lab: NEGATIVE

## 2018-03-11 LAB — CBC
Lab: 13 K/UL — ABNORMAL HIGH (ref >60)
Lab: 5.2 M/UL (ref >60)

## 2018-03-11 LAB — BASIC METABOLIC PANEL: Lab: 141 MMOL/L — ABNORMAL LOW (ref 137–147)

## 2018-03-11 MED ORDER — AMPICILLIN 2G/100ML NS IVPB (MB+)
2 g | INTRAVENOUS | 0 refills | Status: DC
Start: 2018-03-11 — End: 2018-03-16
  Administered 2018-03-11 – 2018-03-16 (×40): 2 g via INTRAVENOUS

## 2018-03-11 MED ORDER — CEFTRIAXONE INJ 2GM IVP
2 g | INTRAVENOUS | 0 refills | Status: DC
Start: 2018-03-11 — End: 2018-03-11

## 2018-03-11 MED ORDER — IBUPROFEN 600 MG PO TAB
600 mg | Freq: Once | ORAL | 0 refills | Status: CP
Start: 2018-03-11 — End: ?
  Administered 2018-03-12: 05:00:00 600 mg via ORAL

## 2018-03-11 MED ORDER — LACTATED RINGERS IV SOLP
1000 mL | INTRAVENOUS | 0 refills | Status: CP
Start: 2018-03-11 — End: ?
  Administered 2018-03-11: 10:00:00 1000 mL via INTRAVENOUS

## 2018-03-11 MED ORDER — DIPHENHYDRAMINE HCL 25 MG PO CAP
25 mg | ORAL | 0 refills | Status: DC | PRN
Start: 2018-03-11 — End: 2018-03-16
  Administered 2018-03-12: 21:00:00 25 mg via ORAL

## 2018-03-12 DIAGNOSIS — N136 Pyonephrosis: Secondary | ICD-10-CM

## 2018-03-12 LAB — BASIC METABOLIC PANEL: Lab: 140 MMOL/L — ABNORMAL LOW (ref >60)

## 2018-03-12 MED ORDER — LACTATED RINGERS IV SOLP
1000 mL | INTRAVENOUS | 0 refills | Status: DC
Start: 2018-03-12 — End: 2018-03-12

## 2018-03-12 MED ORDER — LACTATED RINGERS IV SOLP
INTRAVENOUS | 0 refills | Status: AC
Start: 2018-03-12 — End: ?
  Administered 2018-03-12 – 2018-03-13 (×2): 1000.000 mL via INTRAVENOUS

## 2018-03-12 MED ORDER — SODIUM CHLORIDE 0.9 % IV SOLP
500 mL | INTRAVENOUS | 0 refills | Status: DC
Start: 2018-03-12 — End: 2018-03-13
  Administered 2018-03-12: 12:00:00 500 mL via INTRAVENOUS

## 2018-03-13 LAB — BASIC METABOLIC PANEL: Lab: 140 MMOL/L — ABNORMAL LOW (ref 137–147)

## 2018-03-13 MED ORDER — POTASSIUM CHLORIDE 20 MEQ PO TBTQ
40 meq | Freq: Once | ORAL | 0 refills | Status: CP
Start: 2018-03-13 — End: ?
  Administered 2018-03-13: 15:00:00 40 meq via ORAL

## 2018-03-13 NOTE — Other
IVT for labs

## 2018-03-13 NOTE — Case Management (ED)
Case Management Admission Assessment    NAME:Allen Gill                          MRN: [id number]             DOB:1990/02/29          AGE: 28 y.o.  ADMISSION DATE: 03/10/2018             DAYS ADMITTED: LOS: 3 days      Today???s Date: 03/13/2018    28 y/o M with nephrolithiasis, neurogenic bladder s/p cystectomy and ileal conduit, spina bifida and Arnold Chiari malformation, hydrocephalus s/p VP shunt, heart failure with recovered EF/non-ischemic cardiomyopathy, obesity, OSA, epilepsy, who presented with flank pain and was found to have hydronephrosis and evidence of pyelonephritis.    -- This CM met with pt for assessment on this date.  Provided contact information and explanation of SW/NCM roles.  Reviewed [facility], Preparing for Discharge, and Preferred Provider Network hand-outs.  Provided opportunity for questions and discussion. Pt/family encouraged to contact Case Management team with questions and concerns during hospitalization and until patient is able to transition back to the patient's primary care physician.      Source of Information: patient    ??? Pt lives at home alone in a one level apartment. He works part time and drives himself.   ??? His brother Morton Peters, at bedside, will drive him home at dc.  ??? Pt states he wears his leg braces, but only needs to use his walker/cane if he is weak from a hospitalization.   ??? Pt usually goes to his parents home for 1-2 weeks after a hospitalization if needed for more assistance.    ??? Parents' address:   [address] [place], [place] [address]    ??? Pt has done home IV abx infusions before through [facility] and [facility]. He states he would like to use both these agencies if needed again.    Plan: Discharge home when medically stable. Pt's brother Morton Peters to drive pt home at dc.     Plan: Case Management Assessment, Assist PRN with SW/NCM Services, Discharge Planning for Home Anticipated    Patient Address/Phone  [address]  [place] [phone] ? Financial Assistance Needed?  No needs identified.    Psychosocial Needs  ? Mental Health  Mental Health History: No  ? Substance Use History  Substance Use History Screen: No  ? Other  N/A    Current/Previous Services  ? PCP  Hiram Gash, [phone], [phone]  ? Pharmacy    [facility]  [address]  [address]  [place] [address]  Phone: [phone] Fax: [phone]    [id number] [place], [place] - [address] AT [facility] [address]  [address]  [place] [address]  Phone: [phone] Fax: [phone]    [facility]  [address]  [address] [phone]  [place] [address]  Phone: [phone] Fax: [phone]    ? Durable Psychologist, educational at home: [employer], [place] [place], Single [employer], Other (comment)(leg braces, ileostomy supplies)  ? Home Health  Receiving home health: In the past  Agency name: [facility]  Would patient use this agency again?: Yes  ? Hemodialysis or Peritoneal Dialysis  Undergoing hemodialysis or peritoneal dialysis: No  ? Tube/Enteral  Feeds  Receive tube/enteral feeds: No  ? Infusion  Receive infusions: In the past  Where: home  Infusion company: [facility]  Would patient use this agency again?: Yes  ? Private Duty  Private duty help used: No  ? Home and Community Based [employer] and community based services: No  ? Zackery [facility] [place]: N/A  ? Hospice  Hospice: No  ? Outpatient Therapy  PT: No  OT: No  SLP: No  ? Skilled Nursing Facility/Nursing Home  SNF: No  NH: No  ? Inpatient Rehab  IPR: In the past  When did patient receive care?: March 2014  Name of Facility: [facility]  Would patient return for future services?: Yes  ? Long-Term Acute Care Hospital  LTACH: No  ? Acute Hospital Stay  Acute Hospital Stay: In the past  Was patient's stay within the last 30 days?: No    Sula Rumple, BSN RN  Nurse Case Radio broadcast assistant Text  Phone: [phone]  Pager: [phone]

## 2018-03-14 MED ORDER — POTASSIUM CHLORIDE 20 MEQ PO TBTQ
60 meq | Freq: Once | ORAL | 0 refills | Status: CP
Start: 2018-03-14 — End: ?
  Administered 2018-03-14: 15:00:00 60 meq via ORAL

## 2018-03-16 ENCOUNTER — Inpatient Hospital Stay: Admit: 2018-03-12 | Discharge: 2018-03-12 | Payer: MEDICARE

## 2018-03-16 ENCOUNTER — Inpatient Hospital Stay: Admit: 2018-03-10 | Discharge: 2018-03-16 | Disposition: A | Discharge status: Home / Self Care | Payer: MEDICARE

## 2018-03-16 ENCOUNTER — Inpatient Hospital Stay: Admit: 2018-03-13 | Discharge: 2018-03-13 | Payer: MEDICARE

## 2018-03-16 ENCOUNTER — Encounter: Admit: 2018-03-16 | Discharge: 2018-03-16 | Payer: MEDICARE

## 2018-03-16 DIAGNOSIS — Z6841 Body Mass Index (BMI) 40.0 and over, adult: ICD-10-CM

## 2018-03-16 DIAGNOSIS — I959 Hypotension, unspecified: ICD-10-CM

## 2018-03-16 DIAGNOSIS — Z1611 Resistance to penicillins: ICD-10-CM

## 2018-03-16 DIAGNOSIS — Z936 Other artificial openings of urinary tract status: ICD-10-CM

## 2018-03-16 DIAGNOSIS — G40909 Epilepsy, unspecified, not intractable, without status epilepticus: ICD-10-CM

## 2018-03-16 DIAGNOSIS — N136 Pyonephrosis: ICD-10-CM

## 2018-03-16 DIAGNOSIS — Z881 Allergy status to other antibiotic agents status: ICD-10-CM

## 2018-03-16 DIAGNOSIS — I110 Hypertensive heart disease with heart failure: ICD-10-CM

## 2018-03-16 DIAGNOSIS — Z880 Allergy status to penicillin: ICD-10-CM

## 2018-03-16 DIAGNOSIS — F988 Other specified behavioral and emotional disorders with onset usually occurring in childhood and adolescence: ICD-10-CM

## 2018-03-16 DIAGNOSIS — G4733 Obstructive sleep apnea (adult) (pediatric): ICD-10-CM

## 2018-03-16 DIAGNOSIS — N179 Acute kidney failure, unspecified: ICD-10-CM

## 2018-03-16 DIAGNOSIS — I428 Other cardiomyopathies: ICD-10-CM

## 2018-03-16 DIAGNOSIS — Z87440 Personal history of urinary (tract) infections: ICD-10-CM

## 2018-03-16 DIAGNOSIS — Z982 Presence of cerebrospinal fluid drainage device: ICD-10-CM

## 2018-03-16 DIAGNOSIS — N319 Neuromuscular dysfunction of bladder, unspecified: ICD-10-CM

## 2018-03-16 DIAGNOSIS — R6520 Severe sepsis without septic shock: ICD-10-CM

## 2018-03-16 DIAGNOSIS — B952 Enterococcus as the cause of diseases classified elsewhere: ICD-10-CM

## 2018-03-16 DIAGNOSIS — I5022 Chronic systolic (congestive) heart failure: ICD-10-CM

## 2018-03-16 DIAGNOSIS — Z79899 Other long term (current) drug therapy: ICD-10-CM

## 2018-03-16 DIAGNOSIS — K632 Fistula of intestine: ICD-10-CM

## 2018-03-16 DIAGNOSIS — A4181 Sepsis due to Enterococcus: Principal | ICD-10-CM

## 2018-03-16 DIAGNOSIS — Q0703 Arnold-Chiari syndrome with spina bifida and hydrocephalus: ICD-10-CM

## 2018-03-16 LAB — CULTURE-URINE W/SENSITIVITY
Lab: >10
Lab: >10 — AB
Lab: >10

## 2018-03-16 MED ORDER — CARVEDILOL 6.25 MG PO TAB
6.25 mg | ORAL_TABLET | Freq: Two times a day (BID) | ORAL | 3 refills | 90.00000 days | Status: AC
Start: 2018-03-16 — End: 2018-11-24

## 2018-03-16 MED ORDER — LINEZOLID 600 MG PO TAB
600 mg | Freq: Once | ORAL | 0 refills | Status: CP
Start: 2018-03-16 — End: ?
  Administered 2018-03-16: 16:00:00 600 mg via ORAL

## 2018-03-16 MED ORDER — LINEZOLID 600 MG PO TAB
600 mg | ORAL_TABLET | Freq: Two times a day (BID) | ORAL | 0 refills | Status: AC
Start: 2018-03-16 — End: ?
  Filled 2018-03-16 (×2): qty 9, 5d supply, fill #1

## 2018-03-16 NOTE — Progress Notes
no events overnight. patient continues to feel well with no new complaints.   exam unchanged  labs reviewed     Discussed with Dr. Manya Silvas, E Fecalis is resistant to penicillin, so plan for oral zyvox to finish course of 10 days. First dose here and patient tolerated. Brother will provide transportation.     patient will benefit from a visiting nurse 1-2 times a week to assist with bathing and ileostomy bags changes in hope to decrease frequency of infections.     total of 35 minutes spent coordinating patient discharge today.     Lucretia Kern Danley Danker, MD  Internal Medicine  [phone]

## 2018-03-16 NOTE — Discharge Instructions - Pharmacy
??? Collection Time: 03/10/18 11:19 AM   Result Value Ref Range   ??? Troponin-I 0.00 0.0 - 0.05 NG/ML   URINALYSIS DIPSTICK   ??? Collection Time: 03/10/18 11:25 AM   Result Value Ref Range   ??? Color,UA RED ???   ??? Turbidity,UA CLEAR CLEAR-CLEAR   ??? Specific Gravity-Urine 1.015 1.003 - 1.035   ??? pH,UA 6.0 5.0 - 8.0   ??? Protein,UA 2+ (A) NEG-NEG   ??? Glucose,UA NEG NEG-NEG   ??? Ketones,UA NEG NEG-NEG   ??? Bilirubin,UA NEG NEG-NEG   ??? Blood,UA 2+ (A) NEG-NEG   ??? Urobilinogen,UA NORMAL NORM-NORMAL   ??? Nitrite,UA NEG NEG-NEG   ??? Leukocytes,UA 3+ (A) NEG-NEG   ??? Urine Ascorbic Acid, UA NEG NEG-NEG   URINALYSIS, MICROSCOPIC   ??? Collection Time: 03/10/18 11:25 AM   Result Value Ref Range   ??? WBCs,UA PACKED 0 - 2 /HPF   ??? RBCs,UA PACKED 0 - 3 /HPF   ??? MucousUA TRACE ???   ??? Squamous Epithelial Cells 0-2 0 - 5   ???  Glucose: 82 (03/10/18 1119)     Brief Hospital Course:    28 y/o M with nephrolithiasis, neurogenic bladder s/p cystectomy and ileal conduit, spina bifida and Arnold Chiari malformation, hydrocephalus s/p VP shunt, heart failure with recovered EF/non-ischemic cardiomyopathy, obesity, OSA, epilepsy, who presented with flank pain and was found to have hydronephrosis and evidence of pyelonephritis.  ???  # Complicated UTI / pyelonephritis with enterococcus fecalis   # severe sepsis with hypotension and acute kidney injury, responding to IV fluids  # Left sided hydroureteronephrosis  # Neurogenic bladder s/p cystectomy and ileal conduit formation  # Nephrolithiasis  - Presented with sharp, constant pain in the L flank as is typical for episodes where he has a stone  - CT abd/pelvis on admission with moderate L hydroureteronephrosis at the level of the urinary diversion with perinephric and periureteral soft tissue stranding, with a probably stone in the bag reflecting likely passed stone. Nonobstructive bilateral nephrolithiasis. Chronic right lower quadrant colocutaneous fistula. Discharging attending physician: Robet Leu [facility] [[id number]]      Regular Diet    You have no dietary restriction. Please continue with a healthy balanced diet.      Current Discharge Medication List       START taking these medications    Details   linezolid (ZYVOX) 600 mg tablet Take one tablet by mouth twice daily for 5 days. Start 3/5 in the evening  Qty: 9 tablet, Refills: 0    PRESCRIPTION TYPE:  Normal          CONTINUE these medications which have been CHANGED or REFILLED    Details   carvediloL (COREG) 6.25 mg tablet Take one tablet by mouth twice daily with meals.  Qty: 180 tablet, Refills: 3    PRESCRIPTION TYPE:  Normal  Comments: Dose change, place on file  Associated Diagnoses: S/P VP shunt; Non-ischemic cardiomyopathy (HCC); Essential hypertension          CONTINUE these medications which have NOT CHANGED    Details   acetaminophen (TYLENOL) 325 mg tablet Take two tablets by mouth every 6 hours as needed.  Refills: 0    PRESCRIPTION TYPE:  OTC      cholecalciferol(+) (VITAMIN D3) 2,000 unit tablet Take one tablet by mouth daily.  Qty: 90 tablet, Refills: 0    PRESCRIPTION TYPE:  Normal      cyanocobalamin (VITAMIN B-12) 1,000 mcg tablet Take one tablet  by mouth daily.  Qty: 30 tablet, Refills: 0    PRESCRIPTION TYPE:  Normal      levETIRAcetam (KEPPRA) 500 mg tablet Take one tablet by mouth twice daily.  Qty: 180 tablet, Refills: 0    PRESCRIPTION TYPE:  Normal      losartan (COZAAR) 25 mg tablet Take one tablet by mouth daily.  Qty: 90 tablet, Refills: 3    PRESCRIPTION TYPE:  Normal  Associated Diagnoses: S/P VP shunt; Non-ischemic cardiomyopathy (HCC); Essential hypertension      Ostomy Supplies 1  misc Hollister 1 piece Urostomy bag.  Use 1 bag as needed.  Qty: 10 each, Refills: 10    PRESCRIPTION TYPE:  Print      spironolactone (ALDACTONE) 25 mg tablet Take one tablet by mouth daily. Take with food.  Qty: 90 tablet, Refills: 3    PRESCRIPTION TYPE:  Normal Associated Diagnoses: S/P VP shunt; Non-ischemic cardiomyopathy (HCC); Essential hypertension              Future Appointments   Date Time Provider Department Center   03/30/2018  9:30 AM Hiram Gash, MD [id number]   06/08/2018  8:30 AM [facility]   06/08/2018  1:15 PM Clarita Crane, MD, [facility]   02/15/2019 10:30 AM [place] [id number]       Pending items needing follow up: none    Signed:  Andris Baumann, MD  03/16/2018      cc:  Primary Care Physician:  Orson Gear A   Verified  Referring physicians:  No ref. provider found   Additional provider(s):

## 2018-03-17 ENCOUNTER — Encounter: Admit: 2018-03-17 | Discharge: 2018-03-17 | Payer: MEDICARE

## 2018-03-17 LAB — CULTURE-BLOOD W/SENSITIVITY

## 2018-03-17 NOTE — Telephone Encounter
Hospital Discharge Follow Up      Reached Patient:Yes     Admission Information:     Hospital Name: [facility]  Admission Date: 03/10/18 Discharge Date: 03/16/18   Admission Diagnosis: UTI; Kidney Stones  Discharge Diagnosis  Arnold-Chiari malformation  Essential hypertension  Kidney stone  Non-ischemic cardiomyopathy  Pyelonephritis  S/P ileal conduit  UTI (urinary tract infection)  Complicated UTI (urinary tract infection)  Enterococcus faecalis infection  Has there been a discharge within the last 30 days? No  If yes, reason: N/A  Hospital Services: Unplanned  Today's call is 1(business) days post discharge      Discharge Instruction Review   Did patient receive and understand discharge instructions? Yes    Home Health ordered? No                 Agency name/telephone number: N/A   Has Home Health agency contacted patient? N/A   Caregiver assistance in the home? No, patient notes any assistance he needs he has it available to him.   Are there concerns regarding the patient's ADL'S? Yes. Patient has Arnold-Chiari malformation.  However, any assistance that is needed is has in the home already.   Is patient a fall risk? Yes, patient has [place]ah Loop- Chiari malformation  Special diet? No If yes, type: Regular       Medication Reconciliation    Changes to pre-hospital medications? Yes   START taking:  linezolid (ZYVOX)  CHANGE how you take:  carvediloL (COREG)  Were new prescriptions filled?Yes  Meds reviewed and reconciled?Patient declined   ??? acetaminophen (TYLENOL) 325 mg tablet Take two tablets by mouth every 6 hours as needed.   ??? carvediloL (COREG) 6.25 mg tablet Take one tablet by mouth twice daily with meals.   ??? cholecalciferol(+) (VITAMIN D3) 2,000 unit tablet Take one tablet by mouth daily.   ??? cyanocobalamin (VITAMIN B-12) 1,000 mcg tablet Take one tablet by mouth daily.   ??? levETIRAcetam (KEPPRA) 500 mg tablet Take one tablet by mouth twice daily.

## 2018-03-17 NOTE — Telephone Encounter
Hospital Discharge Follow Up      Reached Patient:Yes     Admission Information:     Hospital Name: [facility]  Admission Date: 03/10/2018  Discharge Date: 03/16/2018  Admission Diagnosis: Pain  Discharge Diagnosis Complicated UTI / pyelonephritis  Has there been a discharge within the last 30 days? No  If yes, reason: [facility] Services: Unplanned  Today's call is 1(business) days post discharge      Discharge Instruction Review   Did patient receive and understand discharge instructions? Yes    Home Health ordered? No                 Agency name/telephone number: na   Has Home Health agency contacted patient? na   Caregiver assistance in the home? No and Lives alone   Are there concerns regarding the patient's ADL'S? No  Is patient a fall risk? Yes    Special diet? No If yes, type: reg      Medication Reconciliation    Changes to pre-hospital medications? Yes  Were new prescriptions filled?Yes  Meds reviewed and reconciled?Yes     ??? acetaminophen (TYLENOL) 325 mg tablet Take two tablets by mouth every 6 hours as needed.   ??? carvediloL (COREG) 6.25 mg tablet Take one tablet by mouth twice daily with meals.   ??? cholecalciferol(+) (VITAMIN D3) 2,000 unit tablet Take one tablet by mouth daily.   ??? cyanocobalamin (VITAMIN B-12) 1,000 mcg tablet Take one tablet by mouth daily.   ??? levETIRAcetam (KEPPRA) 500 mg tablet Take one tablet by mouth twice daily.   ??? linezolid (ZYVOX) 600 mg tablet Take one tablet by mouth twice daily for 5 days. Start 3/5 in the evening   ??? losartan (COZAAR) 25 mg tablet Take one tablet by mouth daily.   ??? Ostomy Supplies 1  misc Hollister 1 piece Urostomy bag.  Use 1 bag as needed.   ??? spironolactone (ALDACTONE) 25 mg tablet Take one tablet by mouth daily. Take with food.         Understanding Condition   Having any current symptoms? No  Patient understands when to seek additional medical care? Yes   Other instructions provided : na

## 2018-03-18 LAB — CULTURE-BLOOD W/SENSITIVITY

## 2018-03-30 ENCOUNTER — Encounter: Admit: 2018-03-30 | Discharge: 2018-03-30 | Payer: MEDICARE

## 2018-04-21 ENCOUNTER — Encounter: Admit: 2018-04-21 | Discharge: 2018-04-21 | Payer: MEDICARE

## 2018-04-21 NOTE — Telephone Encounter
Care provider called stating a detailed order was sent to [place] for patient's ostomy supplies and that it needs to be signed and faxed back. It can be faxed to [phone]. Routing to [employer], LPN to follow up.

## 2018-05-11 ENCOUNTER — Encounter: Admit: 2018-05-11 | Discharge: 2018-05-11 | Payer: MEDICARE

## 2018-05-25 ENCOUNTER — Encounter: Admit: 2018-05-25 | Discharge: 2018-05-25 | Payer: MEDICARE

## 2018-05-25 ENCOUNTER — Ambulatory Visit: Admit: 2018-05-25 | Discharge: 2018-05-26 | Payer: MEDICARE

## 2018-05-25 DIAGNOSIS — N390 Urinary tract infection, site not specified: ICD-10-CM

## 2018-05-25 DIAGNOSIS — Q0700 Arnold-Chiari syndrome without spina bifida or hydrocephalus: ICD-10-CM

## 2018-05-25 DIAGNOSIS — G919 Hydrocephalus, unspecified: ICD-10-CM

## 2018-05-25 DIAGNOSIS — R000 Tachycardia, unspecified: ICD-10-CM

## 2018-05-25 DIAGNOSIS — R51 Headache: ICD-10-CM

## 2018-05-25 DIAGNOSIS — N319 Neuromuscular dysfunction of bladder, unspecified: ICD-10-CM

## 2018-05-25 DIAGNOSIS — R569 Unspecified convulsions: ICD-10-CM

## 2018-05-25 DIAGNOSIS — I429 Cardiomyopathy, unspecified: ICD-10-CM

## 2018-05-25 DIAGNOSIS — G4733 Obstructive sleep apnea (adult) (pediatric): ICD-10-CM

## 2018-05-25 DIAGNOSIS — Q059 Spina bifida, unspecified: ICD-10-CM

## 2018-05-25 DIAGNOSIS — F988 Other specified behavioral and emotional disorders with onset usually occurring in childhood and adolescence: ICD-10-CM

## 2018-05-25 DIAGNOSIS — T85618A Breakdown (mechanical) of other specified internal prosthetic devices, implants and grafts, initial encounter: ICD-10-CM

## 2018-05-25 DIAGNOSIS — N200 Calculus of kidney: ICD-10-CM

## 2018-05-25 DIAGNOSIS — R339 Retention of urine, unspecified: ICD-10-CM

## 2018-05-25 DIAGNOSIS — Z01810 Encounter for preprocedural cardiovascular examination: ICD-10-CM

## 2018-05-25 DIAGNOSIS — I10 Essential (primary) hypertension: ICD-10-CM

## 2018-05-25 NOTE — Progress Notes
In the past 7 days, how many sodas and sugar sweetened drinks (regular, not diet) did you drink each day?: (!) 2    Smoke/Tobacco Use  Are you currently a smoker?: No      Alcohol Use  Do you drink alcohol?: No          Depression Screen  Little interest or pleasure in doing things: Not at All  Feeling down, depressed or hopeless: Not at All    Patient Scores:  PHQ-2: PHQ-2 Score: 0 (05/25/2018  8:21 AM)    PHQ-9: No data recorded  Interventions:  PHQ-2: PHQ-2 Score less than 3: No follow-up or recommendations are necessary at this time (09/27/2017  1:14 PM)    Depression Interventions PHQ-2/9: No data recorded      Pain  How would you rate your pain today?: No pain    Ambulation  Do you use any assistive devices for ambulation?: (!) Yes      Fall Risk  Does it take you longer than 30 seconds to get up and out of a chair?: No  Have you fallen in the past year?: No  Fall History (last 39mo): No Falls    Motor Vehicle Safety  Do you fasten your seat belt when you are in the car?: Yes    Sun Exposure  Do you protect yourself from the sun? For example, wear sunscreen when outside.: Yes    Hearing Loss  Do you have trouble hearing the television or radio when others do not?: No  Do you have to strain or struggle to hear/understand conversation?: No  Do you use hearing aids?: No    Cognitive Impairment  During the past 12 months, have you experienced confusion or memory loss that is happening more often or is getting worse?: No    Functional Screen  Do you live alone?: Yes  Do you live at: Home  Can you drive your own car or travel alone by bus or taxi?: Yes  Can you shop for groceries or clothes without help?: Yes  Can you prepare your own meals?: Yes  Can you do your own housework without help?: (!) No  Can you handle your own money without help?: Yes  Do you need help eating, bathing, dressing, or getting around your home?: No  Do you feel safe?: Yes  Does anyone at home hurt you, hit you, or threaten you?: No Records requested at the time of this visit:No    Prior consultations, labs, radiology reports reviewed at the time of this visit.No            Depression:  Patient Scores:  PHQ-2: PHQ-2 Score: 0 (05/25/2018  8:21 AM)    PHQ-9: No data recorded  Interventions:  PHQ-2: PHQ-2 Score less than 3: No follow-up or recommendations are necessary at this time (09/27/2017  1:14 PM)    Depression Interventions PHQ-2/9: No data recorded  BMI:  Body mass index is 41.88 kg/m???.  No data recorded  Wt Readings from Last 10 Encounters:   05/25/18 110.7 kg (244 lb)   03/14/18 109.3 kg (240 lb 15.4 oz)   02/14/18 106.1 kg (233 lb 12.8 oz)   01/24/18 104.6 kg (230 lb 8 oz)   11/10/17 104.3 kg (230 lb)   09/27/17 99.4 kg (219 lb 3.2 oz)   09/07/17 95.7 kg (211 lb)   09/01/17 90.2 kg (198 lb 12.8 oz)   08/31/17 95.7 kg (211 lb)   08/19/17 97.5 kg (215 lb)  Falls:  Fall History (last 87mo): No Falls (05/25/2018  8:21 AM)  Fall Risk: Use of assistive device (05/25/2018  8:21 AM)                     Review of Systems   Constitution: Negative for fever and malaise/fatigue.   HENT: Negative for congestion.    Eyes: Negative for blurred vision.   Cardiovascular: Positive for dyspnea on exertion. Negative for chest pain.   Respiratory: Negative for cough and shortness of breath.    Skin: Negative for rash.   Musculoskeletal: Positive for muscle weakness. Negative for back pain.   Gastrointestinal: Negative for abdominal pain, constipation, diarrhea, heartburn and nausea.   Psychiatric/Behavioral: Negative for depression. The patient is not nervous/anxious.              Objective:         ??? acetaminophen (TYLENOL) 325 mg tablet Take two tablets by mouth every 6 hours as needed.   ??? carvediloL (COREG) 6.25 mg tablet Take one tablet by mouth twice daily with meals.   ??? cholecalciferol(+) (VITAMIN D3) 2,000 unit tablet Take one tablet by mouth daily.   ??? cyanocobalamin (VITAMIN B-12) 1,000 mcg tablet Take one tablet by mouth daily. ??? levETIRAcetam (KEPPRA) 500 mg tablet Take one tablet by mouth twice daily.   ??? losartan (COZAAR) 25 mg tablet Take one tablet by mouth daily.   ??? Ostomy Supplies 1  misc Hollister 1 piece Urostomy bag.  Use 1 bag as needed.   ??? spironolactone (ALDACTONE) 25 mg tablet Take one tablet by mouth daily. Take with food.     Vitals:    05/25/18 0826   BP: 135/71   BP Source: Arm, Left Upper   Patient Position: Sitting   Pulse: 94   Resp: 20   Temp: 36.3 ???C (97.4 ???F)   TempSrc: Oral   SpO2: 100%   Weight: 110.7 kg (244 lb)   Height: 162.6 cm (64)   PainSc: Zero     Body mass index is 41.88 kg/m???.   Vitals:    05/25/18 0826   BP: 135/71   BP Source: Arm, Left Upper   Patient Position: Sitting   Pulse: 94       Physical Exam  Vitals signs and nursing note reviewed.   Constitutional:       Appearance: Normal appearance. He is obese.   HENT:      Head: Normocephalic and atraumatic.      Right Ear: Tympanic membrane, ear canal and external ear normal.      Left Ear: Tympanic membrane, ear canal and external ear normal.      Nose: Nose normal.      Mouth/Throat:      Mouth: Mucous membranes are moist.      Pharynx: Oropharynx is clear.   Eyes:      Extraocular Movements: Extraocular movements intact.      Pupils: Pupils are equal, round, and reactive to light.   Neck:      Musculoskeletal: Normal range of motion and neck supple.      Vascular: No carotid bruit.      Comments: No thyromegaly  Cardiovascular:      Rate and Rhythm: Normal rate and regular rhythm.      Pulses: Normal pulses.      Heart sounds: Normal heart sounds.   Pulmonary:      Effort: Pulmonary effort is normal.      Breath  sounds: Normal breath sounds.   Musculoskeletal:      Comments: Leg braces bilaterally   Lymphadenopathy:      Cervical: No cervical adenopathy.   Skin:     General: Skin is warm and dry.      Findings: No rash.   Neurological:      General: No focal deficit present. controlled.

## 2018-05-26 DIAGNOSIS — Z0000 Encounter for general adult medical examination without abnormal findings: Principal | ICD-10-CM

## 2018-05-26 DIAGNOSIS — Z23 Encounter for immunization: ICD-10-CM

## 2018-06-08 ENCOUNTER — Ambulatory Visit: Admit: 2018-06-08 | Discharge: 2018-06-08 | Payer: MEDICARE

## 2018-06-08 ENCOUNTER — Encounter: Admit: 2018-06-08 | Discharge: 2018-06-08 | Payer: MEDICARE

## 2018-06-08 DIAGNOSIS — G4733 Obstructive sleep apnea (adult) (pediatric): ICD-10-CM

## 2018-06-08 DIAGNOSIS — F988 Other specified behavioral and emotional disorders with onset usually occurring in childhood and adolescence: ICD-10-CM

## 2018-06-08 DIAGNOSIS — Z01810 Encounter for preprocedural cardiovascular examination: ICD-10-CM

## 2018-06-08 DIAGNOSIS — N200 Calculus of kidney: Principal | ICD-10-CM

## 2018-06-08 DIAGNOSIS — R000 Tachycardia, unspecified: ICD-10-CM

## 2018-06-08 DIAGNOSIS — Q059 Spina bifida, unspecified: Principal | ICD-10-CM

## 2018-06-08 DIAGNOSIS — R569 Unspecified convulsions: ICD-10-CM

## 2018-06-08 DIAGNOSIS — N319 Neuromuscular dysfunction of bladder, unspecified: ICD-10-CM

## 2018-06-08 DIAGNOSIS — I10 Essential (primary) hypertension: ICD-10-CM

## 2018-06-08 DIAGNOSIS — R51 Headache: ICD-10-CM

## 2018-06-08 DIAGNOSIS — G919 Hydrocephalus, unspecified: ICD-10-CM

## 2018-06-08 DIAGNOSIS — Q0700 Arnold-Chiari syndrome without spina bifida or hydrocephalus: ICD-10-CM

## 2018-06-08 DIAGNOSIS — T85618A Breakdown (mechanical) of other specified internal prosthetic devices, implants and grafts, initial encounter: ICD-10-CM

## 2018-06-08 DIAGNOSIS — I429 Cardiomyopathy, unspecified: ICD-10-CM

## 2018-06-08 DIAGNOSIS — N390 Urinary tract infection, site not specified: ICD-10-CM

## 2018-06-08 DIAGNOSIS — R339 Retention of urine, unspecified: ICD-10-CM

## 2018-06-08 NOTE — Progress Notes
Date of Service: 06/08/2018          Orders Placed This Encounter   ??? CT ABD/PELV WO CONTRAST (Stone Protocol)       Assessment and Plan:  In summary, patient status post cystectomy with ileal conduit and history of kidney stones status post PCNL on the left side.  Patient asymptomatic.  He had 3 image studies in 2020.  First 2 including a CT scan showed only a small bilateral nonobstructive stones.  However, last ultrasound done today showed a 1.7 cm right side and 0.8 cm left side kidney stones.  Since patient is doing well and had a CT scan about 3 months ago I recommend for the patient to repeat new scan in 3 months to evaluate stone burden and decide about possible surgical treatment.  Meanwhile patient will be doing diet for stone prevention as recommended below.  Patient will return to the clinic after CT scan.  Drink plenty of water in order to urinate 2.5 liters a day (83 Oz)  Reduce your sodium ingestion to less than 1500mg  a day  Have a regular calcium diet (1200mg )  Drink lemon juice (fresh squeezed lemon or lyme) 134ml/day (4Oz)  Avoid food rich in oxalate -check link (PoolFood.fr)              Subjective:             Allen Gill is a 27 y.o. male.    Chief Complaint   Patient presents with   ??? Kidney Stone       History of Present Illness    28 year old gentleman with a history of ileal conduit and kidney stones s/p left PCNL on 05/25/2017.  Last visit with me on August 22nd 2019.  Patient had ultrasound on March 1 and a CT scan on February 28 both showed only tiny nonobstructive bilateral kidney stones.  Patient has been asymptomatic since last visit.  Denies flank pain, back pain, hematuria and any other symptoms.  He had ultrasound done today before his follow-up.    Medical History:   Diagnosis Date   ??? ADD (attention deficit disorder)     without hyperactivity   ??? Arnold-Chiari malformation (HCC)    ??? Cardiomyopathy (HCC)    ??? Headache(784.0) ??? Other Father         glaucoma   ??? High Cholesterol Maternal Grandfather        Allergies   Allergen Reactions   ??? Latex RASH and SHORTNESS OF BREATH   ??? Amoxicillin RASH and STOMACH UPSET     On 08/03/17 I discussed this with the patient. Amox/clav prescribed in 2014. He certainly took it.  Notes from that time indicate diarrhea. No comment on any other problem such as HA or rash. I discussed this with him on 03/13/18 and he stated that he'd taken amoxicillin three times in the past and each time had HA, debilitating fatigue and diarrhea. And maybe rash. At this time he was tolerating IV ampicillin. Luchi, MD 03/13/18    > Patient tolerated ampicillin March 2020   ??? Ceclor [Cefaclor] HIVES     Pt has tolerated ancef, keflex, and cefepime   ??? Clindamycin HIVES   ??? Zosyn [Piperacillin-Tazobactam] HIVES            Review of Systems   Constitutional: Negative for chills and fever.   HENT: Negative.    Eyes: Negative.    Respiratory: Negative.    Gastrointestinal: Negative.  Negative for abdominal  pain.   Endocrine: Negative.    Genitourinary: Negative for difficulty urinating, dysuria, flank pain, hematuria, penile pain and testicular pain.   Musculoskeletal: Negative.    Skin: Negative.    Allergic/Immunologic: Negative.    Neurological: Negative.    Hematological: Negative.    Psychiatric/Behavioral: Negative.        Objective:    MEDICATION:           ??? acetaminophen (TYLENOL) 325 mg tablet Take two tablets by mouth every 6 hours as needed.   ??? carvediloL (COREG) 6.25 mg tablet Take one tablet by mouth twice daily with meals.   ??? cholecalciferol(+) (VITAMIN D3) 2,000 unit tablet Take one tablet by mouth daily.   ??? cyanocobalamin (VITAMIN B-12) 1,000 mcg tablet Take one tablet by mouth daily.   ??? levETIRAcetam (KEPPRA) 500 mg tablet Take one tablet by mouth twice daily.   ??? losartan (COZAAR) 25 mg tablet Take one tablet by mouth daily.   ??? Ostomy Supplies 1  misc Hollister 1 piece Urostomy bag.  Use 1 bag as needed.

## 2018-07-04 ENCOUNTER — Encounter: Admit: 2018-07-04 | Discharge: 2018-07-04

## 2018-07-04 NOTE — Telephone Encounter
Patient has not been seen since 01/14/2015     Patient asked for a refill several times between today and that visit.     His standing order was for Keppra 500mg  tabs 1 tab twice daily. #180 and no refills.     Pt has cancelled 05/18/17 visit, and we sent a letter out 11/02/2017.  He then no showed (after another refill) his 11/02/2017 appt.  He then stated he had another appt scheduled for 01/24/2018 (after another refill) of which WE cancelled due to COVID-19.     hes now asking for another refill.     This nurse called patient and verified if he had been taking his meds as ordered. He stated he has been taking them as ordered and he has 10 pills left.     I made it abundantly clear that he needs to be seen by one of our providers BEFORE we are able to fill this for him.     He has made a telehealth appt for today at 1430.     I asked him why his visits were so spaced out and he said, "I only see them AS NEEDED"  I told him the legality of the necessity of him being seen by one of our providers at least once a year. He confirmed understanding.     Routed to provider Marlise Eves, RN

## 2018-07-05 ENCOUNTER — Ambulatory Visit: Admit: 2018-07-05 | Discharge: 2018-07-05

## 2018-07-05 DIAGNOSIS — G40109 Localization-related (focal) (partial) symptomatic epilepsy and epileptic syndromes with simple partial seizures, not intractable, without status epilepticus: Principal | ICD-10-CM

## 2018-07-05 MED ORDER — LEVETIRACETAM 500 MG PO TAB
500 mg | ORAL_TABLET | Freq: Two times a day (BID) | ORAL | 3 refills | 90.00000 days | Status: DC
Start: 2018-07-05 — End: 2019-03-01

## 2018-07-05 MED ORDER — LEVETIRACETAM 500 MG PO TAB
ORAL_TABLET | Freq: Two times a day (BID) | 0 refills
Start: 2018-07-05 — End: ?

## 2018-07-05 NOTE — Progress Notes
Tele-Health Progress Note     Obtained patient's verbal consent to treat them and their agreement to [facility] financial policy and NPP via this telehealth visit during the Coronavirus Public Health Emergency    Patient Name: Allen Gill  Age: 28 y.o.  Sex: male    Encounter Date: 07/05/2018  Date of tele-health visit:07/05/18  MRN: [id number]  PCP: Orson Gear A     Reason for visit/ chief complaint:  Seizure/epilepsy follow up     INTERVAL SUMMARY        Phung Bussa Cordova's last visit was on      No seizures as far as he knows for the last 5-6 months. He typically spaces out for up to 20 seconds. No complaints or recent illnesses. Last hospitalization was renally related. He was told his creatinine was high. Other details limited.       REVIEW OF SYSTEMS     Review of Systems:   A 10-point ROS is negative with the exception of pertinent positives as noted above in the HPI.       Previously documented HPI (copied and reviewed)     Mr. Kusnierz returned for followup regarding spells of losing periods of time.??? The last spell was in May 2012.??? He has not had any event concerning for his seizures since the last visit.??? He is on Keppra and tolerates Keppra well without any side effects.???In th interim he had a right ventriculoperitoneal shunt placed in June. Since then his symptoms improved. He denies any new systemic or neurological complaints at this point.   ???  Past medical history, social history, and allergies were reviewed and did not show any changes from the last note.  ???  REVIEW OF SYSTEMS:??? A 10-point system review is as per history, otherwise negative.  ???  PHYSICAL EXAMINATION:??? Blood pressure 151/93, pulse 112, temperature 37.2, respirations 16, weight 95.2 kg.??? Head was atraumatic and normocephalic.??? 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 Otherwise, cranial nerves II through XII were intact.??? 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 There was also hammer toe bilaterally.??? Deep tendon reflexes were 2+ in bilateral upper extremities and bilateral patella, but 0 in bilateral ankles.??? The sensory examination showed decreased sensation in bilateral plantar surfaces.??? Cerebellar examination did not show any dysmetria or dysdiadochokinesia.??? Gait examination showed spastic paretic gait.  ???  IMPRESSION/PLAN:??? 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 Currently, he is seizure free.??? We will continue Keppra at 500 mg b.i.d.??? He will return to clinic in 1 year.??? A new prescription is given.  ???        PAST MEDICAL HISTORY     Medical History:   Diagnosis Date   ??? ADD (attention deficit disorder)     without hyperactivity   ??? Arnold-Chiari malformation (HCC)    ??? Cardiomyopathy (HCC)    ??? Headache(784.0)    ??? Hydrocephalus (HCC)    ??? Hypertension    ??? Infection of VP shunt    ??? Kidney stones     frequent   ??? Myelomeningocele (HCC)    ??? Neurogenic bladder    ??? OSA (obstructive sleep apnea)    ??? Preop cardiovascular exam 07/30/2016   ??? 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 Shunt malfunction    ??? Spina bifida (HCC)    ??? Tachycardia  07/30/2016   ??? Urinary retention 02/19/2016    Added automatically from request for surgery [id number]   ??? Urinary tract infection     frequent       Patient Active Problem List    Diagnosis Date Noted   ??? Complex care coordination 09/14/2016     Priority: High   ??? UTI (urinary tract infection) 03/10/2018   ??? Pyelonephritis 04/13/2017   ??? Neurogenic bladder 12/16/2016   ??? 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 Non-ischemic cardiomyopathy (HCC) 09/29/2016   ??? Essential hypertension 07/30/2016   ??? Urethral stone 07/31/2015   ??? Morbid obesity (HCC) 05/19/2015   ??? Seizure disorder (HCC) 05/19/2015 ??? Transaminitis 05/19/2015   ??? Hydrocephalus (HCC) 04/28/2015   ??? Kidney stone 02/14/2012   ??? 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 Arnold-Chiari malformation (HCC) 11/27/2009   ??? ADD (attention deficit disorder) 10/28/2006   ??? Spina bifida (HCC) 10/28/2006   ??? Unspecified constipation 10/28/2006       PAST SURGICAL HISTORY     Surgical History:   Procedure Laterality Date   ??? SHUNT CREATION VENTRICULAR PERITONEAL Right 06/14/2014    Performed by Angelia Mould, MD at [facility] OR   ??? SHUNT REVISION VENTRICULAR PERITONEAL Right 04/23/2015    Performed by Angelia Mould, MD at [facility] OR   ??? REMOVAL HARDWARE HEAD: removal of left ventriculopleural shunt Left 05/20/2015    Performed by Angelia Mould, MD at [facility] OR   ??? SHUNT REMOVAL VENTRICULAR PERITONEAL Right 05/23/2015    Performed by Angelia Mould, MD at [facility] OR   ??? SHUNT CREATION VENTRICULAR PERITONEAL Left 06/03/2015    Performed by Angelia Mould, MD at [facility] OR   ??? CYSTOURETHROSCOPY, CYSTOLITHOLAPAXY N/A 08/05/2015    Performed by Pennie Banter, MD at [facility] OR   ??? HOLMIUM LASER ENUCLEATION OF PROSTATE (NO MORCELLATION) N/A 08/19/2015    Performed by Vonna Drafts, MD at [facility] OR   ??? CYSTOSCOPY N/A 08/19/2015    Performed by Vonna Drafts, MD at [facility] OR   ??? CYSTOSCOPY EVACUATION CLOTS N/A 08/29/2015    Performed by Vonna Drafts, MD at [facility] OR   ??? CYSTOSCOPY, URETHERAL DILATION N/A 02/20/2016    Performed by Vonna Drafts, MD at [facility] OR   ??? CYSTECTOMY, ILEAL CONDUIT N/A 10/13/2016    Performed by Glennie Isle, MD at [facility] OR   ??? REVISION SHUNT - VENTRICULO-PERITONEAL Left 12/16/2016    Performed by Storm Frisk, MD at [facility] OR   ??? CREATION SHUNT - VENTRICULO-PERITONEAL: Left side. 2 hours, proximal catheter and valve in place. will need distal catheter placed, Dr. Joan Flores to complete. Supine Left 12/22/2016    Performed by Angelia Mould, MD at [facility] OR   ??? PR [facility] PROGRAMMABLE CEREBROSPINAL SHUNT  01/07/2017   ??? PERCUTANEOUS NEPHROSTOLITHOTOMY/ PYELOSTOLITHOTOMY - GREATER THAN 2 CM Left 05/25/2017    Performed by Clarita Crane, MD, [facility] OR   ??? CREATION SHUNT - VENTRICULO-PERITONEAL Right 08/04/2017    Performed by Angelia Mould, MD at [facility] OR   ??? CATHETER IMPLANT/REVISION  12/27/09    distal end of the catheter was revised   ??? CATHETER IMPLANT/REVISION  01/31/10    replacement of ventricular catheter with BrainLab framelessstereotaxis catheter   ??? HX ABDOMEN SURGERY      fundoplication   ??? HX ABDOMEN SURGERY  01/2001    Cecostomy   ??? HX BACK SURGERY      repair of spina bifida   ???  HX BRAIN SURGERY  5 months old    Chiari decompression   ??? HX EAR TUBES     ??? HX HERNIA REPAIR      inguinal hernia   ??? HX SURGERY  at 2 weeks    VP Shunt   ??? HX TONSILLECTOMY     ??? HX TRACHEOSTOMY     ??? SHUNT REVISION  3 months old   ??? SHUNT REVISION  28 years old   ??? SHUNT REVISION  October 2010    [place]   ??? SHUNT REVISION  12/11/09    replacment of valve to acodman hakem adjustablevalve   ??? SHUNT REVISION  02/17/10    left frontal ventriculopleural shunt   ??? VENTRICULOSTOMY  02/03/10    removal of all shunt components and placementofright frontal ventriculostomy       FAMILY HISTORY     Family History   Problem Relation Age of Onset   ??? Diabetes Father    ??? Hypertension Father    ??? Other Father         glaucoma   ??? High Cholesterol Maternal Grandfather        SOCIAL HISTORY     Social History     Socioeconomic History   ??? Marital status: Single     Spouse name: Not on file   ??? Number of children: Not on file   ??? Years of education: Not on file   ??? Highest education level: Not on file   Occupational History   ??? 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 Smoking status: Never Smoker   ??? Smokeless tobacco: Never Used   Substance and Sexual Activity   ??? Alcohol use: No   ??? Drug use: No   ??? Sexual activity: Not Currently     Partners: Female   Other Topics Concern   ??? Not on file   Social History Narrative   ??? Not on file       Social History     Substance and Sexual Activity   Drug Use No Social History     Tobacco Use   Smoking Status Never Smoker   Smokeless Tobacco Never Used       MEDICATION     Current Outpatient Medications   Medication Sig Dispense Refill   ??? acetaminophen (TYLENOL) 325 mg tablet Take two tablets by mouth every 6 hours as needed.  0   ??? carvediloL (COREG) 6.25 mg tablet Take one tablet by mouth twice daily with meals. 180 tablet 3   ??? cholecalciferol(+) (VITAMIN D3) 2,000 unit tablet Take one tablet by mouth daily. 90 tablet 0   ??? cyanocobalamin (VITAMIN B-12) 1,000 mcg tablet Take one tablet by mouth daily. 30 tablet 0   ??? levETIRAcetam (KEPPRA) 500 mg tablet Take one tablet by mouth twice daily. 120 tablet 3   ??? losartan (COZAAR) 25 mg tablet Take one tablet by mouth daily. 90 tablet 3   ??? Ostomy Supplies 1  misc Hollister 1 piece Urostomy bag.  Use 1 bag as needed. 10 each 10   ??? spironolactone (ALDACTONE) 25 mg tablet Take one tablet by mouth daily. Take with food. 90 tablet 3     No current facility-administered medications for this visit.        ALLERGIES     Allergies   Allergen Reactions   ??? Latex RASH and SHORTNESS OF BREATH   ??? Amoxicillin RASH and  STOMACH UPSET     On 08/03/17 I discussed this with the patient. Amox/clav prescribed in 2014. He certainly took it.  Notes from that time indicate diarrhea. No comment on any other problem such as HA or rash. I discussed this with him on 03/13/18 and he stated that he'd taken amoxicillin three times in the past and each time had HA, debilitating fatigue and diarrhea. And maybe rash. At this time he was tolerating IV ampicillin. Luchi, MD 03/13/18    > Patient tolerated ampicillin March 2020   ??? Ceclor [Cefaclor] HIVES     Pt has tolerated ancef, keflex, and cefepime   ??? Clindamycin HIVES   ??? Zosyn [Piperacillin-Tazobactam] HIVES       PHYSICAL EXAMINATION         PHYSICAL EXAM:    General:  ??? Jeffery Kobee Medlen is a well-appearing individual in no acute distress.   ??? Head:  Head normocephalic, atraumatic.     Neuro: ??? Mental status: Awake and alert.   ??? Patient oriented to self, place, and time.  ??? General fund of knowledge and recent and remote memory appear intact based on subjective data/history provided.  ??? The patient is attentive and able participate.   ??? Speech is fluent with normal articulation.   ??? Language has intact comprehension; language content appears appropriate based on our conversation.   ??? Pupils round and equal  ??? Extraocular eye movements are full without nystagmus.   ??? Face appears symmetric at rest and with muscle activation.   ??? Hearing grossly intact.     ??? Tongue is normal in appearance and protrudes in the midline.   ??? Muscle bulk and tone appear normal   ??? No pronator drift.  ??? No asterixis   ??? No adventitious movements.   ??? Finger to nose testing normal without dysmetria.  ??? Rapid alternating movements in the upper extremities (finger tapping) are normal       STUDIES REVIEWED                 Current PHQ Patient Scores:  PHQ-2: PHQ-2 Score: 0 (05/25/2018  8:21 AM)    PHQ-9: No data recorded         IMPRESSION/PLAN   Summary:  Mivaan Corbitt is a 28 y.o.     Impression:  ??? Localization-related epilepsy        Plan:      ??? Continue AED regimen as follows:  ???   ???   ???   ??? Seizure safety precautions discussed with the patient   ??? Follow up          Approximately 25 minutes were spent with the patient of which more than 50% were spent in counseling and coordination of care on 07/05/18. The patient understands and agrees with the plan of care.        Electronically signed by  Dwana Melena, APRN-NP   Epilepsy APP  [facility]  [facility]  on 07/05/2018 at 2:34 PM.   Tele-Health Progress Note     Obtained patient's verbal consent to treat them and their agreement to [facility] financial policy and NPP via this telehealth visit during the [facility] Health Emergency    Patient Name: Allen Gill  Age: 28 y.o.  Sex: male    Encounter Date: 07/05/2018 Date of tele-health visit:07/05/18  MRN: [id number]  PCP: Orson Gear A     Reason for visit/ chief complaint:  Seizure/epilepsy follow up     INTERVAL SUMMARY        [place] Correia's last visit was on       REVIEW OF SYSTEMS     Review of Systems:   A 10-point ROS is negative with the exception of pertinent positives as noted above in the HPI.       Previously documented HPI (copied and reviewed)             PAST MEDICAL HISTORY     Medical History:   Diagnosis Date   ??? ADD (attention deficit disorder)     without hyperactivity   ??? Arnold-Chiari malformation (HCC)    ??? Cardiomyopathy (HCC)    ??? Headache(784.0)    ??? Hydrocephalus (HCC)    ??? Hypertension    ??? Infection of VP shunt    ??? Kidney stones     frequent   ??? Myelomeningocele (HCC)    ??? Neurogenic bladder    ??? OSA (obstructive sleep apnea)    ??? Preop cardiovascular exam 07/30/2016   ??? 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 Shunt malfunction    ??? Spina bifida (HCC)    ??? Tachycardia 07/30/2016   ??? Urinary retention 02/19/2016    Added automatically from request for surgery [id number]   ??? Urinary tract infection     frequent       Patient Active Problem List    Diagnosis Date Noted   ??? Complex care coordination 09/14/2016     Priority: High   ??? UTI (urinary tract infection) 03/10/2018   ??? Pyelonephritis 04/13/2017   ??? Neurogenic bladder 12/16/2016   ??? 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 Non-ischemic cardiomyopathy (HCC) 09/29/2016   ??? Essential hypertension 07/30/2016   ??? Urethral stone 07/31/2015   ??? Morbid obesity (HCC) 05/19/2015   ??? Seizure disorder (HCC) 05/19/2015   ??? Transaminitis 05/19/2015   ??? Hydrocephalus (HCC) 04/28/2015   ??? Kidney stone 02/14/2012   ??? 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 Arnold-Chiari malformation (HCC) 11/27/2009   ??? ADD (attention deficit disorder) 10/28/2006   ??? Spina bifida (HCC) 10/28/2006   ??? Unspecified constipation 10/28/2006       PAST SURGICAL HISTORY     Surgical History:   Procedure Laterality Date ??? SHUNT CREATION VENTRICULAR PERITONEAL Right 06/14/2014    Performed by Angelia Mould, MD at [facility] OR   ??? SHUNT REVISION VENTRICULAR PERITONEAL Right 04/23/2015    Performed by Angelia Mould, MD at [facility] OR   ??? REMOVAL HARDWARE HEAD: removal of left ventriculopleural shunt Left 05/20/2015    Performed by Angelia Mould, MD at [facility] OR   ??? SHUNT REMOVAL VENTRICULAR PERITONEAL Right 05/23/2015    Performed by Angelia Mould, MD at [facility] OR   ??? SHUNT CREATION VENTRICULAR PERITONEAL Left 06/03/2015    Performed by Angelia Mould, MD at [facility] OR   ??? CYSTOURETHROSCOPY, CYSTOLITHOLAPAXY N/A 08/05/2015    Performed by Pennie Banter, MD at [facility] OR   ??? HOLMIUM LASER ENUCLEATION OF PROSTATE (NO MORCELLATION) N/A 08/19/2015    Performed by Vonna Drafts, MD at [facility] OR   ??? CYSTOSCOPY N/A 08/19/2015    Performed by Vonna Drafts, MD at [facility] OR   ??? CYSTOSCOPY EVACUATION CLOTS N/A 08/29/2015    Performed by Vonna Drafts, MD at [facility] OR   ??? CYSTOSCOPY, URETHERAL DILATION N/A 02/20/2016    Performed by Vonna Drafts,  MD at [facility] OR   ??? CYSTECTOMY, ILEAL CONDUIT N/A 10/13/2016    Performed by Glennie Isle, MD at [facility] OR   ??? REVISION SHUNT - VENTRICULO-PERITONEAL Left 12/16/2016    Performed by Storm Frisk, MD at [facility] OR   ??? CREATION SHUNT - VENTRICULO-PERITONEAL: Left side. 2 hours, proximal catheter and valve in place. will need distal catheter placed, Dr. Joan Flores to complete. Supine Left 12/22/2016    Performed by Angelia Mould, MD at [facility] OR   ??? PR [facility] PROGRAMMABLE CEREBROSPINAL SHUNT  01/07/2017   ??? PERCUTANEOUS NEPHROSTOLITHOTOMY/ PYELOSTOLITHOTOMY - GREATER THAN 2 CM Left 05/25/2017    Performed by Clarita Crane, MD, [facility] OR   ??? CREATION SHUNT - VENTRICULO-PERITONEAL Right 08/04/2017    Performed by Angelia Mould, MD at [facility] OR   ??? CATHETER IMPLANT/REVISION  12/27/09    distal end of the catheter was revised   ??? CATHETER IMPLANT/REVISION  01/31/10    replacement of ventricular catheter with Brain[employer] framelessstereotaxis catheter ??? HX ABDOMEN SURGERY      fundoplication   ??? HX ABDOMEN SURGERY  01/2001    Cecostomy   ??? HX BACK SURGERY      repair of spina bifida   ??? HX BRAIN SURGERY  5 months old    Chiari decompression   ??? HX EAR TUBES     ??? HX HERNIA REPAIR      inguinal hernia   ??? HX SURGERY  at 2 weeks    VP Shunt   ??? HX TONSILLECTOMY     ??? HX TRACHEOSTOMY     ??? SHUNT REVISION  3 months old   ??? SHUNT REVISION  28 years old   ??? SHUNT REVISION  October 2010    [place]   ??? SHUNT REVISION  12/11/09    replacment of valve to acodman hakem adjustablevalve   ??? SHUNT REVISION  02/17/10    left frontal ventriculopleural shunt   ??? VENTRICULOSTOMY  02/03/10    removal of all shunt components and placementofright frontal ventriculostomy       FAMILY HISTORY     Family History   Problem Relation Age of Onset   ??? Diabetes Father    ??? Hypertension Father    ??? Other Father         glaucoma   ??? High Cholesterol Maternal Grandfather        SOCIAL HISTORY     Social History     Socioeconomic History   ??? Marital status: Single     Spouse name: Not on file   ??? Number of children: Not on file   ??? Years of education: Not on file   ??? Highest education level: Not on file   Occupational History   ??? 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 Smoking status: Never Smoker   ??? Smokeless tobacco: Never Used   Substance and Sexual Activity   ??? Alcohol use: No   ??? Drug use: No   ??? Sexual activity: Not Currently     Partners: Female   Other Topics Concern   ??? Not on file   Social History Narrative   ??? Not on file       Social History     Substance and Sexual Activity   Drug Use No         Social History     Tobacco Use   Smoking Status Never Smoker  Smokeless Tobacco Never Used       MEDICATION     Current Outpatient Medications   Medication Sig Dispense Refill   ??? acetaminophen (TYLENOL) 325 mg tablet Take two tablets by mouth every 6 hours as needed.  0   ??? carvediloL (COREG) 6.25 mg tablet Take one tablet by mouth twice daily with meals. 180 tablet 3 ??? cholecalciferol(+) (VITAMIN D3) 2,000 unit tablet Take one tablet by mouth daily. 90 tablet 0   ??? cyanocobalamin (VITAMIN B-12) 1,000 mcg tablet Take one tablet by mouth daily. 30 tablet 0   ??? levETIRAcetam (KEPPRA) 500 mg tablet Take one tablet by mouth twice daily. 120 tablet 3   ??? losartan (COZAAR) 25 mg tablet Take one tablet by mouth daily. 90 tablet 3   ??? Ostomy Supplies 1  misc Hollister 1 piece Urostomy bag.  Use 1 bag as needed. 10 each 10   ??? spironolactone (ALDACTONE) 25 mg tablet Take one tablet by mouth daily. Take with food. 90 tablet 3     No current facility-administered medications for this visit.        ALLERGIES     Allergies   Allergen Reactions   ??? Latex RASH and SHORTNESS OF BREATH   ??? Amoxicillin RASH and STOMACH UPSET     On 08/03/17 I discussed this with the patient. Amox/clav prescribed in 2014. He certainly took it.  Notes from that time indicate diarrhea. No comment on any other problem such as HA or rash. I discussed this with him on 03/13/18 and he stated that he'd taken amoxicillin three times in the past and each time had HA, debilitating fatigue and diarrhea. And maybe rash. At this time he was tolerating IV ampicillin. Luchi, MD 03/13/18    > Patient tolerated ampicillin March 2020   ??? Ceclor [Cefaclor] HIVES     Pt has tolerated ancef, keflex, and cefepime   ??? Clindamycin HIVES   ??? Zosyn [Piperacillin-Tazobactam] HIVES       PHYSICAL EXAMINATION         PHYSICAL EXAM:    General:  ??? Deval Aven Essa is a well-appearing individual in no acute distress.   ??? Head:  Head normocephalic, atraumatic.     Neuro:   ??? Mental status: Awake and alert.   ??? Patient oriented to self, place, and time.  ??? Dysarthria and dysphonia noted  ??? The patient is attentive and able participate.   ??? Language has intact comprehension; language content appears appropriate based on our conversation.   ??? Pupils round and equal  ??? Extraocular eye movements are full without nystagmus. ??? Face appears symmetric at rest and with muscle activation.   ??? Hearing grossly intact.     ??? Muscle bulk and tone appear normal   ??? No pronator drift.  ??? No asterixis   ??? No adventitious movements.   ??? Finger to nose testing normal without dysmetria.       STUDIES REVIEWED       No imaging reviewed.        IMPRESSION/PLAN   Summary:  Malhar Montemarano is a 28 y.o. male with:     Impression:  ??? Localization-related epilepsy   ??? S/p VP shunt        Plan:      ??? Continue AED regimen as follows:  ??? Keppra 500 mg BID   ??? Seizure safety precautions discussed with the patient   ??? Follow up with Dr. Kurtis Bushman in 1  yr or sooner if needed.           Approximately 25 minutes were spent with the patient of which more than 50% were spent in counseling and coordination of care on 07/05/18. The patient understands and agrees with the plan of care.         Electronically signed by  Dwana Melena, APRN-NP   Epilepsy APP  [facility]  [facility]  on 07/05/2018 at 2:34 PM.

## 2018-07-28 ENCOUNTER — Emergency Department (HOSPITAL_BASED_OUTPATIENT_CLINIC_OR_DEPARTMENT_OTHER)
Admission: EM | Admit: 2018-07-28 | Discharge: 2018-07-28 | Disposition: A | Discharge status: Home / Self Care | Payer: No Typology Code available for payment source | Attending: Emergency Medicine | Admitting: Emergency Medicine

## 2018-07-28 ENCOUNTER — Other Ambulatory Visit: Payer: Self-pay

## 2018-07-28 ENCOUNTER — Encounter (HOSPITAL_BASED_OUTPATIENT_CLINIC_OR_DEPARTMENT_OTHER): Payer: Self-pay

## 2018-07-28 DIAGNOSIS — S3991XA Unspecified injury of abdomen, initial encounter: Secondary | ICD-10-CM | POA: Diagnosis present

## 2018-07-28 DIAGNOSIS — Y990 Civilian activity done for income or pay: Secondary | ICD-10-CM | POA: Insufficient documentation

## 2018-07-28 DIAGNOSIS — S39011A Strain of muscle, fascia and tendon of abdomen, initial encounter: Secondary | ICD-10-CM

## 2018-07-28 DIAGNOSIS — X500XXA Overexertion from strenuous movement or load, initial encounter: Secondary | ICD-10-CM | POA: Diagnosis not present

## 2018-07-28 DIAGNOSIS — Y9259 Other trade areas as the place of occurrence of the external cause: Secondary | ICD-10-CM | POA: Insufficient documentation

## 2018-07-28 DIAGNOSIS — F17210 Nicotine dependence, cigarettes, uncomplicated: Secondary | ICD-10-CM | POA: Diagnosis not present

## 2018-07-28 DIAGNOSIS — Y939 Activity, unspecified: Secondary | ICD-10-CM | POA: Diagnosis not present

## 2018-07-28 LAB — URINALYSIS, ROUTINE W REFLEX MICROSCOPIC
Bilirubin Urine: NEGATIVE
Glucose, UA: NEGATIVE mg/dL
Hgb urine dipstick: NEGATIVE
Ketones, ur: NEGATIVE mg/dL
Leukocytes,Ua: NEGATIVE
Nitrite: NEGATIVE
Protein, ur: NEGATIVE mg/dL
Specific Gravity, Urine: 1.01 (ref 1.005–1.030)
pH: 6.5 (ref 5.0–8.0)

## 2018-07-28 NOTE — ED Notes (Signed)
Pt understood dc material. NAD Noted. All questions answered to satisfaction. Provider filled out form from patients employer and it was returned at dc. Pt escorted to check out window.

## 2018-07-28 NOTE — ED Provider Notes (Signed)
[facility] EMERGENCY DEPARTMENT Provider Note   CSN: [id number] Arrival date & time: 07/28/18  1841    History   Chief Complaint Chief Complaint  Patient presents with  . Abdominal Pain    HPI Johnathan Salinas is a 28 y.o. male.  Who presents emergency department chief complaint of abdominal pain.  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  He said that he has had this happen twice before and has self resolved.  He complains of pain in his bellybutton which he describes as burning that radiates down toward his groin.  He denies any urinary symptoms other than what described above, no discharge from his penis.  He is in a second sexual relationship with a single male partner with whom he lives and is monogamous.    HPI  History reviewed. No pertinent past medical history.  There are no active problems to display for this patient.   Past Surgical History:  Procedure Laterality Date  . WRIST SURGERY          Home Medications    Prior to Admission medications   Medication Sig Start Date End Date Taking? Authorizing Provider  amoxicillin (AMOXIL) 500 MG capsule Take 1 capsule (500 mg total) by mouth 3 (three) times daily. 08/06/12   Janne NapoleonNeese, Hope M, NP  HYDROcodone-acetaminophen (NORCO/VICODIN) 5-325 MG per tablet Take 1 tablet by mouth every 4 (four) hours as needed. 08/06/12   Janne NapoleonNeese, Hope M, NP  ibuprofen (ADVIL,MOTRIN) 800 MG tablet Take 1 tablet (800 mg total) by mouth 3 (three) times daily. 05/28/15   Palumbo, April, MD  naproxen (NAPROSYN) 500 MG tablet Take 1 tablet (500 mg total) by mouth 2 (two) times daily. 08/06/12   Janne NapoleonNeese, Hope M, NP    Family History No family history on file.  Social History Social History   Tobacco Use  . Smoking status: Current Every Day  Smoker    Types: Cigarettes  . Smokeless tobacco: Never Used  Substance Use Topics  . Alcohol use: Yes    Comment: daily  . Drug use: No     Allergies   Patient has no known allergies.   Review of Systems Review of Systems  Ten systems reviewed and are negative for acute change, except as noted in the HPI.   Physical Exam Updated Vital Signs BP 123/72 (BP Location: Right Arm)   Pulse 72   Temp 98.6 F (37 C) (Oral)   Resp 18   SpO2 100%   Physical Exam Vitals signs and nursing note reviewed. Exam conducted with a chaperone present.  Constitutional:      General: He is not in acute distress.    Appearance: He is well-developed. He is not diaphoretic.  HENT:     Head: Normocephalic and atraumatic.  Eyes:     General: No scleral icterus.    Conjunctiva/sclera: Conjunctivae normal.  Neck:     Musculoskeletal: Normal range of motion and neck supple.  Cardiovascular:     Rate and Rhythm: Normal rate and regular rhythm.     Heart sounds: Normal heart sounds.  Pulmonary:     Effort: Pulmonary effort is normal. No respiratory distress.     Breath sounds: Normal breath sounds.  Abdominal:     General: Abdomen  is flat.     Palpations: Abdomen is soft.     Tenderness: There is abdominal tenderness.     Hernia: No hernia is present. There is no hernia in the left inguinal area or right inguinal area.    Genitourinary:    Penis: Normal.      Scrotum/Testes: Normal. Cremasteric reflex is present.        Right: Mass, tenderness or swelling not present.        Left: Mass, tenderness or swelling not present.  Lymphadenopathy:     Lower Body: No right inguinal adenopathy. No left inguinal adenopathy.  Skin:    General: Skin is warm and dry.  Neurological:     Mental Status: He is alert.  Psychiatric:        Behavior: Behavior normal.      ED Treatments / Results  Labs (all labs ordered are listed, but only abnormal results are displayed) Labs Reviewed   URINALYSIS, ROUTINE W REFLEX MICROSCOPIC - Abnormal; Notable for the following components:      Result Value   Color, Urine STRAW (*)    All other components within normal limits    EKG None  Radiology No results found.  Procedures Procedures (including critical care time)  Medications Ordered in ED Medications - No data to display   Initial Impression / Assessment and Plan / ED Course  I have reviewed the triage vital signs and the nursing notes.  Pertinent labs & imaging results that were available during my care of the patient were reviewed by me and considered in my medical decision making (see chart for details).        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  He has no purulent discharge is in a monogamous relationship and there is no white blood cells on review of his urinalysis or other evidence of infection to suggest STI.  The patient has a very benign exam.  Suggest counterpressure with abdominal binder or back brace, proper lifting techniques and follow-up with [place] [facility] for further evaluation.  Patient appears appropriate for discharge at this time.  Case discussed with Dr. Venora Maples who agrees with plan for discharge and strict report return precautions given. Final Clinical Impressions(s) / ED Diagnoses   Final diagnoses:  Abdominal wall strain, initial encounter    ED Discharge Orders    None       Margarita Mail, PA-C 07/29/18 1205    Jola Schmidt, MD 07/29/18 2354

## 2018-07-28 NOTE — Discharge Instructions (Addendum)
Contact a health care provider if: You have more pain or swelling in the injured area. Get help right away if: You have numbness or tingling or lose a lot of strength in the injured area.   SEEK IMMEDIATE MEDICAL ATTENTION IF: The pain does not go away or becomes severe.  A temperature above 101 develops.  Repeated vomiting occurs (multiple episodes).  The pain becomes localized to portions of the abdomen. The right side could possibly be appendicitis. In an adult, the left lower portion of the abdomen could be colitis or diverticulitis.  Blood is being passed in stools or vomit (bright red or black tarry stools).  Return also if you develop chest pain, difficulty breathing, dizziness or fainting, or become confused, poorly responsive, or inconsolable (young children).

## 2018-07-28 NOTE — ED Triage Notes (Signed)
Pt c/o pain from abd to groin after lifting box at work ~620pm-NAD-steady gait

## 2018-08-15 ENCOUNTER — Encounter: Admit: 2018-08-15 | Discharge: 2018-08-15

## 2018-08-21 ENCOUNTER — Other Ambulatory Visit: Payer: Self-pay

## 2018-08-21 DIAGNOSIS — Z20822 Contact with and (suspected) exposure to covid-19: Secondary | ICD-10-CM

## 2018-08-22 LAB — NOVEL CORONAVIRUS, NAA: SARS-CoV-2, NAA: NOT DETECTED

## 2018-09-01 ENCOUNTER — Encounter: Admit: 2018-09-01 | Discharge: 2018-09-01

## 2018-09-01 NOTE — Progress Notes
CARE MANAGEMENT FOLLOWUP      Discussed with patient:   States no complications with current health. No seizures or renal issues to speak of.  Staying safe in his dwelling.   No need for med refills at this time. Will call [facility] to request from PCP if so.  Pt denies any CCM needs at this time.     PATIENT GOALS:  1. Increase water intake.    BARRIERS:   No barriers identified at this time.    Education:  Encouraged pt to call with questions or concerns as they arise.        FUTURE APPOINTMENTS:    Upcoming appointment date and time and with whom scheduled:   Future Appointments   Date Time Provider [place]   09/08/2018  9:00 AM [place] CVM Exam   09/21/2018  1:30 PM [facility] Radiolog   09/21/2018  2:45 PM Thalia Bloodgood, MD, [facility]   11/21/2018 10:30 AM Joneen Caraway, MD [place]   02/15/2019 10:30 AM [place] [facility] CVM Procedur   06/27/2019 11:30 AM Trilby Leaver, MD [facility] Neurology        Next Patient Outreach Approximately in  4 weeks or 10/02/2018

## 2018-09-21 ENCOUNTER — Encounter: Admit: 2018-09-21 | Discharge: 2018-09-21

## 2018-09-21 ENCOUNTER — Ambulatory Visit: Admit: 2018-09-21 | Discharge: 2018-09-21

## 2018-09-21 DIAGNOSIS — N200 Calculus of kidney: Principal | ICD-10-CM

## 2018-09-21 DIAGNOSIS — N390 Urinary tract infection, site not specified: Secondary | ICD-10-CM

## 2018-09-21 DIAGNOSIS — I10 Essential (primary) hypertension: Secondary | ICD-10-CM

## 2018-09-21 DIAGNOSIS — Z01810 Encounter for preprocedural cardiovascular examination: Secondary | ICD-10-CM

## 2018-09-21 DIAGNOSIS — N319 Neuromuscular dysfunction of bladder, unspecified: Secondary | ICD-10-CM

## 2018-09-21 DIAGNOSIS — I429 Cardiomyopathy, unspecified: Secondary | ICD-10-CM

## 2018-09-21 DIAGNOSIS — F988 Other specified behavioral and emotional disorders with onset usually occurring in childhood and adolescence: Secondary | ICD-10-CM

## 2018-09-21 DIAGNOSIS — T85618A Breakdown (mechanical) of other specified internal prosthetic devices, implants and grafts, initial encounter: Secondary | ICD-10-CM

## 2018-09-21 DIAGNOSIS — R51 Headache: Secondary | ICD-10-CM

## 2018-09-21 DIAGNOSIS — R000 Tachycardia, unspecified: Secondary | ICD-10-CM

## 2018-09-21 DIAGNOSIS — Q059 Spina bifida, unspecified: Secondary | ICD-10-CM

## 2018-09-21 DIAGNOSIS — R339 Retention of urine, unspecified: Secondary | ICD-10-CM

## 2018-09-21 DIAGNOSIS — G919 Hydrocephalus, unspecified: Secondary | ICD-10-CM

## 2018-09-21 DIAGNOSIS — G4733 Obstructive sleep apnea (adult) (pediatric): Secondary | ICD-10-CM

## 2018-09-21 DIAGNOSIS — R569 Unspecified convulsions: Secondary | ICD-10-CM

## 2018-09-21 DIAGNOSIS — Q0700 Arnold-Chiari syndrome without spina bifida or hydrocephalus: Secondary | ICD-10-CM

## 2018-09-21 NOTE — Progress Notes
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  Asymptomatic.  New CT scan showed cluster of small kidney stones no obstructive on the right lower pole.  No hydronephrosis.  No hydronephrosis and stones in the left.  Since patient is asymptomatic and no UTIs, plan is to observe the right side kidney stones and return to clinic in 9 months with a new ultrasound.  Patient agreed with the plan.              Subjective:             Allen Gill is a 28 y.o. male.    Chief Complaint   Patient presents with   ??? Follow Up       History of Present Illness    This is a 28 year old male who returns to clinic today for follow-up after 3 months.  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  He returns today for a follow-up after CT scan.  Patient is doing relatively well with only complaint of mild bilateral back pain.  Denies hematuria.  Denies history of UTIs recently.      Medical History:   Diagnosis Date   ??? ADD (attention deficit disorder)     without hyperactivity   ??? Arnold-Chiari malformation (HCC)    ??? Cardiomyopathy (HCC)    ??? Headache(784.0)    ??? Hydrocephalus (HCC)    ??? Hypertension    ??? Infection of VP shunt    ??? Kidney stones     frequent   ??? Myelomeningocele (HCC)    ??? Neurogenic bladder    ??? OSA (obstructive sleep apnea)    ??? Preop cardiovascular exam 07/30/2016   ??? 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 Shunt malfunction    ??? Spina bifida (HCC)    ??? Tachycardia 07/30/2016   ??? Urinary retention 02/19/2016    Added automatically from request for surgery [id number]   ??? Urinary tract infection     frequent       Surgical History:   Procedure Laterality Date   ??? SHUNT CREATION VENTRICULAR PERITONEAL Right 06/14/2014    Performed by Angelia Mould, MD at [facility] OR   ??? SHUNT REVISION VENTRICULAR PERITONEAL Right 04/23/2015    Performed by Angelia Mould, MD at [facility] OR ??? REMOVAL HARDWARE HEAD: removal of left ventriculopleural shunt Left 05/20/2015    Performed by Angelia Mould, MD at [facility] OR   ??? SHUNT REMOVAL VENTRICULAR PERITONEAL Right 05/23/2015    Performed by Angelia Mould, MD at [facility] OR   ??? SHUNT CREATION VENTRICULAR PERITONEAL Left 06/03/2015    Performed by Angelia Mould, MD at [facility] OR   ??? CYSTOURETHROSCOPY, CYSTOLITHOLAPAXY N/A 08/05/2015    Performed by Pennie Banter, MD at [facility] OR   ??? HOLMIUM LASER ENUCLEATION OF PROSTATE (NO MORCELLATION) N/A 08/19/2015    Performed by Vonna Drafts, MD at [facility] OR   ??? CYSTOSCOPY N/A 08/19/2015    Performed by Vonna Drafts, MD at [facility] OR   ??? CYSTOSCOPY EVACUATION CLOTS N/A 08/29/2015    Performed by Vonna Drafts, MD at [facility] OR   ??? CYSTOSCOPY, URETHERAL DILATION N/A 02/20/2016    Performed by Vonna Drafts, MD at [facility] OR   ??? CYSTECTOMY, ILEAL CONDUIT  N/A 10/13/2016    Performed by Glennie Isle, MD at [facility] OR   ??? REVISION SHUNT - VENTRICULO-PERITONEAL Left 12/16/2016    Performed by Storm Frisk, MD at [facility] OR   ??? CREATION SHUNT - VENTRICULO-PERITONEAL: Left side. 2 hours, proximal catheter and valve in place. will need distal catheter placed, Dr. Joan Flores to complete. Supine Left 12/22/2016    Performed by Angelia Mould, MD at [facility] OR   ??? [facility] PROGRAMMABLE CEREBROSPINAL SHUNT  01/07/2017   ??? PERCUTANEOUS NEPHROSTOLITHOTOMY/ PYELOSTOLITHOTOMY - GREATER THAN 2 CM Left 05/25/2017    Performed by Clarita Crane, MD, [facility] OR   ??? CREATION SHUNT - VENTRICULO-PERITONEAL Right 08/04/2017    Performed by Angelia Mould, MD at [facility] OR   ??? CATHETER IMPLANT/REVISION  12/27/09    distal end of the catheter was revised   ??? CATHETER IMPLANT/REVISION  01/31/10    replacement of ventricular catheter with [employer] framelessstereotaxis catheter   ??? HX ABDOMEN SURGERY      fundoplication   ??? HX ABDOMEN SURGERY  01/2001    Cecostomy   ??? HX BACK SURGERY      repair of spina bifida   ??? HX BRAIN SURGERY  5 months old    Chiari decompression ??? HX EAR TUBES     ??? HX HERNIA REPAIR      inguinal hernia   ??? HX SURGERY  at 2 weeks    VP Shunt   ??? HX TONSILLECTOMY     ??? HX TRACHEOSTOMY     ??? SHUNT REVISION  3 months old   ??? SHUNT REVISION  28 years old   ??? SHUNT REVISION  October 2010    [place]   ??? SHUNT REVISION  12/11/09    replacment of valve to acodman hakem adjustablevalve   ??? SHUNT REVISION  02/17/10    left frontal ventriculopleural shunt   ??? VENTRICULOSTOMY  02/03/10    removal of all shunt components and placementofright frontal ventriculostomy       Social History     Socioeconomic History   ??? Marital status: Single     Spouse name: Not on file   ??? Number of children: Not on file   ??? Years of education: Not on file   ??? Highest education level: Not on file   Occupational History   ??? 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 Smoking status: Never Smoker   ??? Smokeless tobacco: Never Used   Substance and Sexual Activity   ??? Alcohol use: No   ??? Drug use: No   ??? Sexual activity: Not Currently     Partners: Female   Other Topics Concern   ??? Not on file   Social History Narrative   ??? Not on file        Family History   Problem Relation Age of Onset   ??? Diabetes Father    ??? Hypertension Father    ??? Other Father         glaucoma   ??? High Cholesterol Maternal Grandfather        Allergies   Allergen Reactions   ??? Latex RASH and SHORTNESS OF BREATH   ??? Amoxicillin RASH and STOMACH UPSET     On 08/03/17 I discussed this with the patient. Amox/clav prescribed in 2014. He certainly took it.  Notes from that time indicate diarrhea. No comment on any other problem such as HA or  rash. I discussed this with him on 03/13/18 and he stated that he'd taken amoxicillin three times in the past and each time had HA, debilitating fatigue and diarrhea. And maybe rash. At this time he was tolerating IV ampicillin. Luchi, MD 03/13/18    > Patient tolerated ampicillin March 2020   ??? Ceclor [Cefaclor] HIVES     Pt has tolerated ancef, keflex, and cefepime ??? Clindamycin HIVES   ??? Zosyn [Piperacillin-Tazobactam] HIVES            Review of Systems   Constitutional: Negative for chills and fever.   HENT: Negative.    Eyes: Negative.    Respiratory: Negative.    Gastrointestinal: Negative.  Negative for abdominal pain.   Endocrine: Negative.    Genitourinary: Negative for flank pain, hematuria, penile pain and testicular pain.   Musculoskeletal: Positive for back pain.   Skin: Negative.    Allergic/Immunologic: Negative.    Neurological: Negative.    Hematological: Negative.    Psychiatric/Behavioral: Negative.        Objective:    MEDICATION:           ??? acetaminophen (TYLENOL) 325 mg tablet Take two tablets by mouth every 6 hours as needed.   ??? carvediloL (COREG) 6.25 mg tablet Take one tablet by mouth twice daily with meals.   ??? cholecalciferol(+) (VITAMIN D3) 2,000 unit tablet Take one tablet by mouth daily.   ??? cyanocobalamin (VITAMIN B-12) 1,000 mcg tablet Take one tablet by mouth daily.   ??? levETIRAcetam (KEPPRA) 500 mg tablet Take one tablet by mouth twice daily.   ??? losartan (COZAAR) 25 mg tablet Take one tablet by mouth daily.   ??? Ostomy Supplies 1  misc [place] 1 piece Urostomy bag.  Use 1 bag as needed.   ??? spironolactone (ALDACTONE) 25 mg tablet Take one tablet by mouth daily. Take with food.       PHYSICAL EXAM:    Vitals:    09/21/18 1508   BP: 120/73   BP Source: Arm, Left Upper   Patient Position: Sitting   Pulse: 85   Resp: 16   Temp: 36.7 ???C (98.1 ???F)   TempSrc: Temporal   Weight: 106.6 kg (235 lb)   Height: 162.6 cm (64)   PainSc: Zero       Body mass index is 40.34 kg/m???.     Physical Exam   Constitutional: He is oriented to person, place, and time. He appears well-developed and well-nourished.   HENT:   Head: Normocephalic and atraumatic.   Eyes: Pupils are equal, round, and reactive to light. Conjunctivae are normal.   Neck: Normal range of motion. Neck supple.   Cardiovascular: Normal rate and regular rhythm.   Pulmonary/Chest: Effort normal. Abdominal: Soft.   Genitourinary:    Genitourinary Comments: Urostomy draining clear urine     Musculoskeletal: Normal range of motion.   Neurological: He is alert and oriented to person, place, and time.   Skin: Skin is warm.   Psychiatric: He has a normal mood and affect. His behavior is normal. Judgment normal.       LABS:    Culture   Date Value Ref Range Status   03/12/2018 NO GROWTH 5 DAYS  Final   03/12/2018 NO GROWTH 5 DAYS  Final   03/11/2018 NO GROWTH 5 DAYS  Final     Lab Results   Component Value Date    UGLUPOC neg 12/23/2015    UBILIPOC neg 12/23/2015  UKETPOC neg 12/23/2015    USPGRPOC 1.020 12/23/2015    UBLDPOC neg 12/23/2015    UPHPOC 7.0 12/23/2015    UPROTPOC neg 12/23/2015    UROBPOC neg 12/23/2015    ULEUKPOC neg 12/23/2015    UCOLOR RED 03/10/2018    TURBID CLEAR 03/10/2018    TURBID CLEAR 01/18/2018    TURBID 2+ (A) 11/02/2017     Lab Results   Component Value Date    NA 140 03/16/2018    K 4.4 03/16/2018    CL 101 03/16/2018    CO2 30 03/16/2018    GAP 9 03/16/2018    GLU 124 (H) 03/16/2018    BUN 11 03/16/2018    CR 0.61 03/16/2018    CA 9.3 03/16/2018    GFR >60 03/16/2018    GFRAA >60 03/16/2018    GFRAA >60 03/15/2018    GFRAA >60 03/14/2018       Lab Results   Component Value Date    UWBC PACKED 03/10/2018    URBC PACKED 03/10/2018    UMUC TRACE 03/10/2018    USQUCELLS 0-2 03/10/2018    USQUCELLS 0-2 01/18/2018    USQUCELLS 0-2 11/02/2017       STONE ANALYSIS:    Lab Results   Component Value Date    STONESOURC  05/25/2017     Passed [facility], [address], [place], [place] [address]      ICONSTIT  05/25/2017     50% Calcium phosphate (apatite)  [facility], [address], [place] [address]      IICONSTIT  05/25/2017     30% Calcium oxalate monohydrate  [facility], [address], [place] [address]      IIICONSTIT  05/25/2017     20% Magnesium ammonium phosphate (struvite) -------------------ADDITIONAL INFORMATION-------------------  This test was developed and its performance characteristics   determined by [facility] in a manner consistent with CLIA   requirements. This test has not been cleared or approved by   the U.S. Food and Drug Administration.  [facility], [address], [place], [place] [address]         Radiology:    I reviewed myself the image studies of the patient. Patient has a 5 mm kidney/ureteral stone on theright side.                           Beckie Busing, MD, [facility]  Urology

## 2018-10-02 ENCOUNTER — Encounter: Admit: 2018-10-02 | Discharge: 2018-10-02 | Payer: MEDICARE

## 2018-10-02 MED ORDER — CARVEDILOL 6.25 MG PO TAB
ORAL_TABLET | Freq: Two times a day (BID) | 0 refills
Start: 2018-10-02 — End: ?

## 2018-11-20 ENCOUNTER — Encounter: Admit: 2018-11-20 | Discharge: 2018-11-20 | Payer: MEDICARE

## 2018-11-20 NOTE — Progress Notes
Called pt to discuss CCM. Pt indicates he's been having a HA since Saturday which usually indicates his shunt might be malfunctioning.   Asked pt if other symptoms were also being experienced. Pt indicates also having dry heaving. Unable to vomit since his fundoplication.   This RN encouraged pt to go to ED if his symptoms are outside normal for him.    Pt has appt with PCP tomorrow and will be cxl'd until pt's general well-being has been determined. Pt will call this RN back to reschedule appt.  Pt wants to also have new order for brace and shoe sent to [facility]. Explained that documentation by PCP will need indicate leg requiring brace has been assessed and documented, further supporting insurance criteria for brace coverage is met. Pt agreed to this. Message forwarded to PCP.

## 2018-11-21 ENCOUNTER — Encounter: Admit: 2018-11-21 | Discharge: 2018-11-21 | Payer: MEDICARE

## 2018-11-21 ENCOUNTER — Emergency Department: Admit: 2018-11-21 | Discharge: 2018-11-21 | Payer: MEDICARE

## 2018-11-21 ENCOUNTER — Emergency Department: Admit: 2018-11-21 | Discharge: 2018-11-22 | Payer: MEDICARE

## 2018-11-21 DIAGNOSIS — R339 Retention of urine, unspecified: Secondary | ICD-10-CM

## 2018-11-21 DIAGNOSIS — R000 Tachycardia, unspecified: Secondary | ICD-10-CM

## 2018-11-21 DIAGNOSIS — Z982 Presence of cerebrospinal fluid drainage device: Secondary | ICD-10-CM

## 2018-11-21 DIAGNOSIS — Q059 Spina bifida, unspecified: Secondary | ICD-10-CM

## 2018-11-21 DIAGNOSIS — T85618A Breakdown (mechanical) of other specified internal prosthetic devices, implants and grafts, initial encounter: Secondary | ICD-10-CM

## 2018-11-21 DIAGNOSIS — N319 Neuromuscular dysfunction of bladder, unspecified: Secondary | ICD-10-CM

## 2018-11-21 DIAGNOSIS — Q0700 Arnold-Chiari syndrome without spina bifida or hydrocephalus: Secondary | ICD-10-CM

## 2018-11-21 DIAGNOSIS — N200 Calculus of kidney: Secondary | ICD-10-CM

## 2018-11-21 DIAGNOSIS — I10 Essential (primary) hypertension: Secondary | ICD-10-CM

## 2018-11-21 DIAGNOSIS — N390 Urinary tract infection, site not specified: Secondary | ICD-10-CM

## 2018-11-21 DIAGNOSIS — F988 Other specified behavioral and emotional disorders with onset usually occurring in childhood and adolescence: Secondary | ICD-10-CM

## 2018-11-21 DIAGNOSIS — G919 Hydrocephalus, unspecified: Secondary | ICD-10-CM

## 2018-11-21 DIAGNOSIS — N3001 Acute cystitis with hematuria: Secondary | ICD-10-CM

## 2018-11-21 DIAGNOSIS — Z01810 Encounter for preprocedural cardiovascular examination: Secondary | ICD-10-CM

## 2018-11-21 DIAGNOSIS — R569 Unspecified convulsions: Secondary | ICD-10-CM

## 2018-11-21 DIAGNOSIS — R519 Nonintractable headache, unspecified chronicity pattern, unspecified headache type: Secondary | ICD-10-CM

## 2018-11-21 DIAGNOSIS — I429 Cardiomyopathy, unspecified: Secondary | ICD-10-CM

## 2018-11-21 DIAGNOSIS — R51 Headache: Secondary | ICD-10-CM

## 2018-11-21 DIAGNOSIS — G4733 Obstructive sleep apnea (adult) (pediatric): Secondary | ICD-10-CM

## 2018-11-21 MED ORDER — CEPHALEXIN 500 MG PO CAP
500 mg | ORAL_CAPSULE | Freq: Four times a day (QID) | ORAL | 0 refills | Status: DC
Start: 2018-11-21 — End: 2018-11-28

## 2018-11-21 MED ORDER — LEG BRACE MISC MISC
0 refills | Status: DC
Start: 2018-11-21 — End: 2018-12-13

## 2018-11-21 MED ORDER — FENTANYL CITRATE (PF) 50 MCG/ML IJ SOLN
50 ug | Freq: Once | INTRAVENOUS | 0 refills | Status: CP
Start: 2018-11-21 — End: ?
  Administered 2018-11-22: 05:00:00 50 ug via INTRAVENOUS

## 2018-11-21 MED ORDER — CEPHALEXIN 500 MG PO CAP
500 mg | ORAL_CAPSULE | Freq: Four times a day (QID) | ORAL | 0 refills | Status: DC
Start: 2018-11-21 — End: 2018-11-22

## 2018-11-21 MED ORDER — CEFTRIAXONE INJ 1GM IVP
1 g | Freq: Once | INTRAVENOUS | 0 refills | Status: CP
Start: 2018-11-21 — End: ?
  Administered 2018-11-22: 05:00:00 1 g via INTRAVENOUS

## 2018-11-22 ENCOUNTER — Emergency Department: Admit: 2018-11-22 | Discharge: 2018-11-21 | Payer: MEDICARE

## 2018-11-22 ENCOUNTER — Encounter: Admit: 2018-11-22 | Discharge: 2018-11-22 | Payer: MEDICARE

## 2018-11-22 LAB — CBC AND DIFF
Lab: 0 10*3/uL (ref 0–0.20)
Lab: 0.1 10*3/uL (ref 0–0.45)
Lab: 8 10*3/uL (ref 4.5–11.0)
Lab: 24 — ABNORMAL HIGH (ref <20.7)

## 2018-11-22 LAB — COMPREHENSIVE METABOLIC PANEL
Lab: 139 MMOL/L (ref 137–147)
Lab: >60 mL/min (ref >60)
Lab: >60 mL/min — ABNORMAL HIGH (ref >60)

## 2018-11-22 LAB — MAGNESIUM: Lab: 2 mg/dL — ABNORMAL LOW (ref 1.6–2.6)

## 2018-11-22 LAB — URINALYSIS DIPSTICK REFLEX TO CULTURE
Lab: NEGATIVE 10*3/uL (ref 3–12)
Lab: NEGATIVE MMOL/L (ref 21–30)
Lab: NEGATIVE g/dL (ref 3.5–5.0)

## 2018-11-22 LAB — URINALYSIS MICROSCOPIC REFLEX TO CULTURE

## 2018-11-22 NOTE — ED Notes
Discharge paperwork discussed with Pt. Pt verbalizes understanding of instructions and follow-up information. Pt ambulated off unit with steady gait.

## 2018-11-22 NOTE — Telephone Encounter
ED Discharge Follow Up  Reached patient: Yes  Admission Information  Hospital Name : [facility]  ED Admission Date: 11/21/2018 ED Discharge Date: 11/21/2018 Admission Diagnosis: Headache  Discharge Diagnosis UTI  Hospital Services: Unplanned  Today's call is 1 (calendar) days post discharge    Discharge Instruction Review  Did patient receive and understand discharge instructions? Yes  Are there concerns regarding the patient?s ADL?s ? No    Medication Reconciliation  Changes to pre-ED visit medications? Yes  Were new prescriptions filled?No,  due to Pt dc'd at midnight last night and will have scripts filled today.  Meds reviewed and reconciled? Yes  ? acetaminophen (TYLENOL) 325 mg tablet Take two tablets by mouth every 6 hours as needed.   ? carvediloL (COREG) 6.25 mg tablet Take one tablet by mouth twice daily with meals.   ? cephalexin (KEFLEX) 500 mg capsule Take one capsule by mouth four times daily for 10 days. Indications: bacterial urinary tract infection   ? cholecalciferol(+) (VITAMIN D3) 2,000 unit tablet Take one tablet by mouth daily.   ? cyanocobalamin (VITAMIN B-12) 1,000 mcg tablet Take one tablet by mouth daily.   ? Leg Brace misc Fitted leg brace and shoe for foot drop.  Dx: spina bifida.   ? levETIRAcetam (KEPPRA) 500 mg tablet Take one tablet by mouth twice daily.   ? losartan (COZAAR) 25 mg tablet Take one tablet by mouth daily.   ? Doctor, general practice 1 piece Urostomy bag.  Use 1 bag as needed.   ? spironolactone (ALDACTONE) 25 mg tablet Take one tablet by mouth daily. Take with food.         Understanding Condition  Having any current symptoms? Yes Unchanged at this point-HA, N/dry heaves  Patient understands when to seek additional medical care? Yes  Is patient aware of [facility] locations?Yes  Urgent Care appropriate for this diagnosis? No  Other instructions provided: Call with questions or concerns. Pt directed to nearest ED should his neuro status warrant.    Scheduling Follow-up Appointment  Upcoming appointment date and time and with whom scheduled:   Future Appointments   Date Time Provider Department Center   11/24/2018  3:30 PM [facility] Client [id number] CVM Exam   02/15/2019 10:30 AM [place] [facility] 1 [facility] CVM Procedur   06/21/2019  1:00 PM [facility] Radiolog   06/21/2019  1:45 PM Earl Many, MD [facility]   06/27/2019 11:30 AM Maia Petties, MD [id number] Neurology     When was patient?s last PCP visit: 05/25/2018  PCP primary location: [facility]  PCP appointment scheduled? Yes, Date: 11/30/2018   Specialist appointment scheduled? Yes, with Neurology 12/09/2018  Both PCP and Specialist appointment scheduled: Yes  Is assistance with transportation needed? No    ED Communication   Did Pt call Clinic prior to going to ED? Yes Referred to ED by clinic/provider  Reason patient went to ED: Unable to treat potential dx in clinic setting.    Rush Farmer, RN

## 2018-11-22 NOTE — ED Notes
RN attempted to assess Pt but notes Pt is not in room. Belongings still in room.

## 2018-11-22 NOTE — ED Notes
Allen Gill is a 28 year old male who arrives for possible shunt problems. He reports he has two VP Shunts and he feels like the right one is malfunctioning. He states they have malfunction every year at least a few times. He states the symptoms he has is slow speech, headache, and feels like his vision is going out since Saturday. He denies other concerns at this time. Patient resting comfortably in cart, locked in low position, and call light within reach. On cardiac, BP and SPO2 monitors. Waiting MD assessment and evaluation.    Belongings: patient denies, he states he has it all with him.

## 2018-11-22 NOTE — Consults
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  Patient currently with bilateral frontal Codman Certas VP shunts both set at 6 presenting with blurry vision and nausea.      -No acute surgical invention warranted.  -Given his UA finding consistent with a UTI recommend continual evaluation and treatment per primary team.  -Patient can follow-up in the neurosurgery clinic in 2 weeks time or earlier if symptoms do not resolve or worsen.    - Patient and plan discussed with neurosurgery attending and senior resident  - Please call with any changes in neurologic exam, questions, or concerns.    Jabier Gauss, MD  [phone]  __________________________________________________________________________________    Chief Complaint:  H/N and blurry vision    History of Present Illness:   28 year old male PMH notable for spina bifida with myelomeningocele and Chiari II malformation status post VP shunt placement by multiple revision neurogenic bladder prior to cystectomy with ileal conduit.  Patient currently with bilateral frontal VP shunts.  He presents to [facility] ED with symptoms of nausea headache that started on Saturday he also endorses intermittent blurry vision that lasts 10 seconds that began this morning.  She denies any vomiting he denies any altered mentation.  Denies any recent infection any abdominal pain any tenderness along the V note patient does have a history of P shunt tract or valve.  Recurrent ureteral cystic stones as well as UTIs.  CT head from today demonstrates stable ventricles with no change when compared to CT head from 01/18/2017.  Shunt series is unremarkable with no evidence of catheter disruption, fracture or kinking.  Patient had a UA done which demonstrated findings consistent with a UTI.      Past Medical History:  Medical History:   Diagnosis Date   ? 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 Arnold-Chiari malformation (HCC)    ? Cardiomyopathy (HCC)    ? Headache(784.0)    ? Hydrocephalus (HCC)    ? Hypertension    ? Infection of VP shunt    ? Kidney stones     frequent   ? Myelomeningocele (HCC)    ? Neurogenic bladder    ? OSA (obstructive sleep apnea)    ? Preop cardiovascular exam 07/30/2016   ? Seizures (HCC)     blank staring spells in childhood   ? Shunt malfunction    ? Spina bifida (HCC)    ? Tachycardia 07/30/2016   ? Urinary retention 02/19/2016    Added automatically from request for surgery [id number]   ? Urinary tract infection     frequent       Past Surgical History:  Surgical History:   Procedure Laterality Date   ? SHUNT CREATION VENTRICULAR PERITONEAL Right 06/14/2014    Performed by Angelia Mould, MD at [facility] OR   ? SHUNT REVISION VENTRICULAR PERITONEAL Right 04/23/2015    Performed by Angelia Mould, MD at [facility] OR   ? REMOVAL HARDWARE HEAD: removal of left ventriculopleural shunt Left 05/20/2015    Performed by Angelia Mould, MD at [facility] OR   ? SHUNT REMOVAL VENTRICULAR PERITONEAL  Right 05/23/2015    Performed by Angelia Mould, MD at [facility] OR   ? SHUNT CREATION VENTRICULAR PERITONEAL Left 06/03/2015    Performed by Angelia Mould, MD at [facility] OR   ? CYSTOURETHROSCOPY, CYSTOLITHOLAPAXY N/A 08/05/2015    Performed by Pennie Banter, MD at [facility] OR   ? HOLMIUM LASER ENUCLEATION OF PROSTATE (NO MORCELLATION) N/A 08/19/2015    Performed by Vonna Drafts, MD at [facility] OR   ? CYSTOSCOPY N/A 08/19/2015    Performed by Vonna Drafts, MD at [facility] OR   ? CYSTOSCOPY EVACUATION CLOTS N/A 08/29/2015    Performed by Vonna Drafts, MD at [facility] OR   ? CYSTOSCOPY, URETHERAL DILATION N/A 02/20/2016    Performed by Vonna Drafts, MD at [facility] OR ? CYSTECTOMY, ILEAL CONDUIT N/A 10/13/2016    Performed by Glennie Isle, MD at [facility] OR   ? REVISION SHUNT - VENTRICULO-PERITONEAL Left 12/16/2016    Performed by Storm Frisk, MD at [facility] OR   ? CREATION SHUNT - VENTRICULO-PERITONEAL: Left side. 2 hours, proximal catheter and valve in place. will need distal catheter placed, Dr. Joan Flores to complete. Supine Left 12/22/2016    Performed by Angelia Mould, MD at [facility] OR   ? PR REPRGRMG PROGRAMMABLE CEREBROSPINAL SHUNT  01/07/2017   ? PERCUTANEOUS NEPHROSTOLITHOTOMY/ PYELOSTOLITHOTOMY - GREATER THAN 2 CM Left 05/25/2017    Performed by Clarita Crane, MD, [facility] at [facility] OR   ? CREATION SHUNT - VENTRICULO-PERITONEAL Right 08/04/2017    Performed by Angelia Mould, MD at [facility] OR   ? CATHETER IMPLANT/REVISION  12/27/09    distal end of the catheter was revised   ? CATHETER IMPLANT/REVISION  01/31/10    replacement of ventricular catheter with [employer] framelessstereotaxis catheter   ? HX ABDOMEN SURGERY      fundoplication   ? HX ABDOMEN SURGERY  01/2001    Cecostomy   ? HX BACK SURGERY      repair of spina bifida   ? HX BRAIN SURGERY  5 months old    Chiari decompression   ? HX EAR TUBES     ? HX HERNIA REPAIR      inguinal hernia   ? HX SURGERY  at 2 weeks    VP Shunt   ? HX TONSILLECTOMY     ? HX TRACHEOSTOMY     ? SHUNT REVISION  3 months old   ? SHUNT REVISION  28 years old   ? SHUNT REVISION  October 2010    [place]   ? SHUNT REVISION  12/11/09    replacment of valve to acodman hakem adjustablevalve   ? SHUNT REVISION  02/17/10    left frontal ventriculopleural shunt   ? VENTRICULOSTOMY  02/03/10    removal of all shunt components and placementofright frontal ventriculostomy       Social History:  Social History     Tobacco Use   ? Smoking status: Never Smoker   ? Smokeless tobacco: Never Used   Substance Use Topics   ? Alcohol use: No   ? Drug use: No       Family History:  Family History   Problem Relation Age of Onset   ? Diabetes Father    ? Hypertension Father ? Other Father         glaucoma   ? High Cholesterol Maternal Grandfather        Allergies:    Latex; Amoxicillin; Ceclor [cefaclor]; Clindamycin; and Zosyn [piperacillin-tazobactam]  Medications:  (Not in a hospital admission)      Review of Systems:   Full 10 point review of systems negative except for HPI    Physical Exam:  Vital Signs: Last Filed In 24 Hours Vital Signs: 24 Hour Range   BP: 134/78 (11/10 1830)  Temp: 37.1 ?C (98.7 ?F) (11/10 1459)  Pulse: 76 (11/10 1830)  Respirations: 17 PER MINUTE (11/10 1830)  SpO2: 97 % (11/10 1830)  SpO2 Pulse: 75 (11/10 1830)  Height: 162.6 cm (64) (11/10 1459) BP: (102-145)/(71-80)   Temp:  [37.1 ?C (98.7 ?F)]   Pulse:  [76-87]   Respirations:  [16 PER MINUTE-18 PER MINUTE]   SpO2:  [96 %-97 %]    Intensity Pain Scale (Self Report): 7 (11/21/18 1500)      General appearance: No acute distress  Lungs: Clear to auscultation bilaterally  Heart: Regular rate and rhythm  Gastrointestinal: Soft, non-tender. Bowel sounds normal. no masses,  no organomegaly  Musculoskeletal: No edema, redness or tenderness in the calves or thighs  Skin: Integument intact without major lesion.  Psychiatric: Normal affect    Neurologic Exam:   Mental Status: Awake, alert and oriented x 4, fluent speech, normal cognition  Pupils: Pupils equal round and reactive to light  Cranial Nerves: CN II-XII individually tested and found to be intact, gag not tested  Motor:   AA EF EE WF WE FF FE FA G HF KF KE DF PF EHL   Left 5 5 5 5 5 5 5 5 5 5 5 5 5 5 5    Right 5 5 5 5 5 5 5 5 5 5 5 5 5 5 5    Normal muscle bulk and tone  No pronator drift  Sensation: Sensation intact to light touch, pinprick and proprioception throughout  Deep Tendon Reflexes:   Tr Bi Br Pa Ac   Left 2 2 2 2 2    Right 2 2 2 2 2    Plantar responses toes downgoing bilaterally  No clonus bilaterally  Gait: deferred  Cerebellar: No dysmetria, No dysdiadochokinesia    Lab Tests:  Hematology:    Lab Results   Component Value Date HGB 12.3 11/21/2018    HCT 37.9 11/21/2018    PLTCT 344 11/21/2018    WBC 8.0 11/21/2018    NEUT 54 11/21/2018    ANC 4.33 11/21/2018    ALC 2.40 11/21/2018    MONA 13 11/21/2018    AMC 1.06 11/21/2018    ABC 0.04 11/21/2018    MCV 79.9 11/21/2018    MCHC 32.6 11/21/2018    MPV 8.4 11/21/2018    RDW 16.0 11/21/2018     General Chemistry:   Lab Results   Component Value Date    NA 139 11/21/2018    K 3.6 11/21/2018    CL 104 11/21/2018    CO2 25 11/21/2018    BUN 25 11/21/2018    CR 0.77 11/21/2018    GLU 74 11/21/2018    GLU 73 08/30/2016    CA 8.7 11/21/2018    MG 2.0 11/21/2018    PO4 2.9 10/15/2016     General Chemistry:   Lab Results   Component Value Date    GAP 10 11/21/2018    ALBUMIN 3.8 11/21/2018    LACTIC 2.4 08/29/2015    TOTBILI 0.2 11/21/2018    TOTPROT 7.5 11/21/2018    LIPASE 14 03/10/2018    AST 34 11/21/2018    ALT 58 11/21/2018  ALKPHOS 96 11/21/2018    LDH 238 05/20/2015       Radiology and other Diagnostics Review:    Pertinent imaging reviewed      Jabier Gauss, MD  [phone]

## 2018-11-22 NOTE — ED Notes
Pt A&O x 4, connected to monitor, VSS, patent airway, breathing non-labored, and pulses palpable. RN notes bed is in lowest position, side rails up, and call light within reach.

## 2018-11-24 ENCOUNTER — Ambulatory Visit: Admit: 2018-11-24 | Discharge: 2018-11-24 | Payer: MEDICARE

## 2018-11-24 ENCOUNTER — Encounter: Admit: 2018-11-24 | Discharge: 2018-11-24 | Payer: MEDICARE

## 2018-11-24 DIAGNOSIS — N319 Neuromuscular dysfunction of bladder, unspecified: Secondary | ICD-10-CM

## 2018-11-24 DIAGNOSIS — I5042 Chronic combined systolic (congestive) and diastolic (congestive) heart failure: Secondary | ICD-10-CM

## 2018-11-24 DIAGNOSIS — N200 Calculus of kidney: Secondary | ICD-10-CM

## 2018-11-24 DIAGNOSIS — R000 Tachycardia, unspecified: Secondary | ICD-10-CM

## 2018-11-24 DIAGNOSIS — I10 Essential (primary) hypertension: Secondary | ICD-10-CM

## 2018-11-24 DIAGNOSIS — T85618A Breakdown (mechanical) of other specified internal prosthetic devices, implants and grafts, initial encounter: Secondary | ICD-10-CM

## 2018-11-24 DIAGNOSIS — R51 Headache: Secondary | ICD-10-CM

## 2018-11-24 DIAGNOSIS — G919 Hydrocephalus, unspecified: Secondary | ICD-10-CM

## 2018-11-24 DIAGNOSIS — R339 Retention of urine, unspecified: Secondary | ICD-10-CM

## 2018-11-24 DIAGNOSIS — I428 Other cardiomyopathies: Secondary | ICD-10-CM

## 2018-11-24 DIAGNOSIS — N390 Urinary tract infection, site not specified: Secondary | ICD-10-CM

## 2018-11-24 DIAGNOSIS — Q0700 Arnold-Chiari syndrome without spina bifida or hydrocephalus: Secondary | ICD-10-CM

## 2018-11-24 DIAGNOSIS — R569 Unspecified convulsions: Secondary | ICD-10-CM

## 2018-11-24 DIAGNOSIS — F988 Other specified behavioral and emotional disorders with onset usually occurring in childhood and adolescence: Secondary | ICD-10-CM

## 2018-11-24 DIAGNOSIS — I429 Cardiomyopathy, unspecified: Secondary | ICD-10-CM

## 2018-11-24 DIAGNOSIS — G4733 Obstructive sleep apnea (adult) (pediatric): Secondary | ICD-10-CM

## 2018-11-24 DIAGNOSIS — Q059 Spina bifida, unspecified: Secondary | ICD-10-CM

## 2018-11-24 DIAGNOSIS — Z01810 Encounter for preprocedural cardiovascular examination: Secondary | ICD-10-CM

## 2018-11-24 MED ORDER — LOSARTAN 25 MG PO TAB
25 mg | ORAL_TABLET | Freq: Every day | ORAL | 3 refills | 30.00000 days | Status: AC
Start: 2018-11-24 — End: ?

## 2018-11-24 MED ORDER — SPIRONOLACTONE 25 MG PO TAB
25 mg | ORAL_TABLET | Freq: Every day | ORAL | 3 refills | 90.00000 days | Status: AC
Start: 2018-11-24 — End: ?

## 2018-11-24 MED ORDER — CARVEDILOL 6.25 MG PO TAB
6.25 mg | ORAL_TABLET | Freq: Two times a day (BID) | ORAL | 3 refills | 90.00000 days | Status: AC
Start: 2018-11-24 — End: ?

## 2018-11-24 NOTE — Progress Notes
Date of Service: 11/24/2018    Allen Gill is a 28 y.o. male.       HPI     Mr. Allen Gill was seen today in the heart failure clinic for a 75-month follow-up.  He was last seen by Allen Gill on 02/14/2018.  At that appointment, no medication changes were made except for coordination with his primary care physician to discontinue his Adderall.  In late February, he was hospitalized with abdominal pain and fever.  He was also seen in the emergency department on 11/21/2018 after experiencing visual changes and nausea as well as a headache.  Because of his history of spina bifida and Chiari II malformation status post VP shunt placement, neurosurgery was consulted.  They did not find any acute surgical intervention needed at that time.  He was found to have a UTI and was discharged from the emergency department with antibiotics.    His past medical history includes spina bifida, hydrocephalus, cardiomyopathy with an LVEF of 25% in the setting of sepsis several years ago, now with recovery of his ejection fraction, ADD, Debroah Loop?Chiari malformation, hypertension, and status post VP shunt with multiple revisions.  His last echocardiogram from 03/01/2017 revealed an LVEF of 55% with grade 1 diastolic dysfunction, normal RV in size and function, and no hemodynamically significant valvular abnormalities. He presents in clinic today accompanied by a caregiver.  He reports that he has been doing pretty well from a cardiac standpoint.  He continues to have headaches, some blurry vision, and memory issues, all of which he attributes to his shunt.  He denies any shortness of breath, lower extremity edema, abdominal bloating, dizziness or lightheadedness, chest pain or palpitations, PND or orthopnea.  He is appetite is good as he has gained about 20 pounds since his last clinic visit.  He knows this is due to increased calorie intake as well as decreased activity secondary to the pandemic.  He denies any difficulty sleeping.         Vitals:    11/24/18 1506   BP: 120/80   BP Source: Arm, Left Upper   Pulse: 88   SpO2: 97%   Weight: 113.9 kg (251 lb)   Height: 1.626 m (5' 4)   PainSc: Zero     Body mass index is 43.08 kg/m?Marland Kitchen     Past Medical History  Patient Active Problem List    Diagnosis Date Noted   ? Complex care coordination 09/14/2016     Priority: High   ? Chronic combined systolic (congestive) and diastolic (congestive) heart failure (HCC) 11/24/2018   ? UTI (urinary tract infection) 03/10/2018   ? Pyelonephritis 04/13/2017   ? Neurogenic bladder 12/16/2016   ? 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 Non-ischemic cardiomyopathy (HCC) 09/29/2016   ? Essential hypertension 07/30/2016   ? Urethral stone 07/31/2015     Added automatically from request for surgery [phone]     ? Morbid obesity (HCC) 05/19/2015   ? Seizure disorder (HCC) 05/19/2015   ? Transaminitis 05/19/2015   ? Hydrocephalus (HCC) 04/28/2015   ? Kidney stone 02/14/2012     [place] 12/27/11: The kidneys are normal in size. The right kidney measures 11.0 x 5.5 cm and contains a large calculus in the mid to inferior pelvis, measuring 4.8 cm x  0.6 cm x 1.5 cm. There is mild pelviectasis which fails to resolve on post void imaging. The left kidney measures 11.7 x 5.4 cm. No renal masses are identified. 02/28/12  CT showed right staghorn calculus and a small left upper pole stone.  04/07/12 right PCNL - stable post op course for 2 days and discharged. Readmitted with hypotension and respiratory distress and acute drop in H/H. No PE and pleural effusions. No signs of bleeding/no perinephric bleeding. Sepsis w/u neg but placed on erapenum. Second look dealyed until 04/21/12 - successful and dishcaged to [facility]  06/09/12 Recovered fully and now for metabolic w/u               ? 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 Arnold-Chiari malformation (HCC) 11/27/2009   ? ADD (attention deficit disorder) 10/28/2006   ? Spina bifida (HCC) 10/28/2006   ? Unspecified constipation 10/28/2006         Review of Systems   Genitourinary: Positive for flank pain.   Neurological: Positive for headaches.       Physical Exam  General Appearance: no distress  Skin: warm and dry  Digits and Nails: normal color, smooth symmetric nails and digits  Eyes: conjunctivae and lids normal  Neck Veins: JVP 6cm, -HJR  Auscultation/Percussion: breathing comfortably, lungs clear to auscultation, no rales or rhonchi, no wheezing  Cardiac Auscultation: Regular rhythm, S1, S2, no S3 or S4, no murmur  Radial Arteries: normal symmetric radial pulses  Abdominal Aorta: no abdominal aortic bruit  Lower Extremity Edema: none, bilateral leg braces to knees  Abdominal Exam: soft, non-tender,  bowel sounds normal, obese  Gait & Station: normal balance and gait  Muscle Strength: normal strength and tone  Orientation: clear historian, good insight       Cardiovascular Studies      Problems Addressed Today  Encounter Diagnoses   Name Primary?   ? Chronic combined systolic (congestive) and diastolic (congestive) heart failure (HCC) Yes   ? Non-ischemic cardiomyopathy (HCC)    ? Essential hypertension    ? Morbid obesity (HCC)        Assessment and Plan  1. Chronic combined systolic (congestive) and diastolic (congestive) heart failure (HCC) - recovered-> LVEF 25% in 05/2015, most recently 55% in 02/2017  - NYHA class II, stage C symptoms  - GDMT:   BB: carvedilol 6.25mg  po BID  ACEI/ARB/ARNI:losartan 25mg  daily  ZO:[id number] 25mg  daily  Hydralazine/isordil:NA  - SGLT2: can consider SGLT2 in future->will defer to Allen Gill  - Last echo: will repeat echo in February 2021  - Labs: labs from 11/21/2018 reviewed  - Follow-up: 3 months with Allen Gill     2. Non-ischemic cardiomyopathy (HCC)   - euvolemic on no diuretics   3. Essential hypertension   - BP 120/80 on above medications   4. Morbid obesity (HCC)   - patient has had 20 pound weight since last clinic visit in 02/2018  - discussion re: sodium restriction, low calorie foods, healthy choices  - will request a phone consult with the dietician at their convenience       Plan:  The following is a copy of the written instructions and plan I gave to the patient during his office visit.    Patient Instructions       Thank you for coming to [facility]. Your instructions today:     1.  Recommendations: no changes in medications.  2.  Next follow up appointment in 3 months with Allen Gill  3.  You have an echocardiogram scheduled on 02/15/2019.    For up to date information on the COVID-19 virus, visit the [facility]  website.  ? General supportive care during cold and flu season and infection prevention reminders:   o Wash hands often with soap and water for at least 20 seconds  o Cover your mouth and nose  o Stay home if sick and symptoms mild or manageable  ? If you must be around people wear a mask    ? If you are having symptoms of a lower respiratory infection (cough, shortness of breath) and/or fever AND either traveled in last 30 days (internationally or to region of exposure) OR known exposure to patient with COVID19:    o Call your primary care provider for questions or health needs.   ? Tell your doctor about your recent travel and your symptoms o In a medical emergency, call 911 or go to the nearest emergency room.      If you wish to contact us, please call and leave a message for the heart failure nurses at [phone].   To schedule or change an appointment call [phone].    Renita Papa, MD, MSc  Ivory Broad, APRN  Elayne Snare, RN  [facility]  Phone: [phone] Fax: [phone]    Your Heart Failure Symptom Awareness and Action Plan  Every Day Action Plan  ? Weigh yourself in the morning before breakfast. Write it down and compare it to yesterday's weight.  ? Take your medicine, as prescribed. Please call if you have concerns about the side effects, cost or refills.  ? Check for worsened swelling in your feet, ankles and stomach  ? Follow a 2000mg  salt diet.  ? Keep all healthcare appointments    Green Zone   Good! Symptoms are under control ? No shortness of breath  ? No increase in ankle swelling  ? No weight gain  ? No chest pain  ? No change in your usual activity  ? Continue to follow your everyday action plan.    Yellow Zone  If you have any of these symptoms, please call the heart failure nurses: [phone] ? Increased shortness of breath with activity  ? Weight gain of 3 pounds in one day or 5 pounds in a week  ? Increased swelling in your ankles or legs  ? Increased swelling in your stomach  ? Increasing fatigue  ? You may need an adjustment of your medications.    Red Zone  These are urgent symptoms. Please call the heart failure nurses: [phone] ? Shortness of breath at rest or waking up at night feeling short of breath or coughing  ? Increased number of pillows used or needing to sit upright to sleep  ? Chest tightness at rest  ? Dizziness, lightheadedness or feeling faint You need to schedule an appointment   Emergency Zone ? call 911 ? Worsening chest tightness or pain that is not relieved by medication  ? Severe shortness of breath and a cough with pink, frothy sputum Future Appointments   Date Time Provider Department Center   11/30/2018  3:30 PM Hiram Gash, MD [id number]   02/15/2019 10:30 AM [place] [id number]   06/21/2019  1:00 PM [facility]   06/21/2019  1:45 PM Earl Many, MD [facility]   06/27/2019 11:30 AM Maia Petties, MD [id number] Neurology           Education/counseling time spent: 25 minutes of 35 minute visit  Topics:  HF disease process, Treatment options, Treatment risks  and benefits, Medication instructions, Medication interactions, Daily weight monitoring, Sodium and fluid restrictions, calorie counting, smart food choices, Personalized exercise guidelines, , Knows HF symptoms, Knows when to call, Knows who to call, Knows next appt.           Current Medications (including today's revisions)  ? acetaminophen (TYLENOL) 325 mg tablet Take two tablets by mouth every 6 hours as needed.   ? carvediloL (COREG) 6.25 mg tablet Take one tablet by mouth twice daily with meals.   ? cephalexin (KEFLEX) 500 mg capsule Take one capsule by mouth four times daily for 10 days. Indications: bacterial urinary tract infection   ? cholecalciferol(+) (VITAMIN D3) 2,000 unit tablet Take one tablet by mouth daily.   ? cyanocobalamin (VITAMIN B-12) 1,000 mcg tablet Take one tablet by mouth daily.   ? Leg Brace misc Fitted leg brace and shoe for foot drop.  Dx: spina bifida.   ? levETIRAcetam (KEPPRA) 500 mg tablet Take one tablet by mouth twice daily.   ? losartan (COZAAR) 25 mg tablet Take one tablet by mouth daily.   ? Doctor, general practice 1 piece Urostomy bag.  Use 1 bag as needed.   ? spironolactone (ALDACTONE) 25 mg tablet Take one tablet by mouth daily. Take with food.

## 2018-11-28 ENCOUNTER — Encounter: Admit: 2018-11-28 | Discharge: 2018-11-28 | Payer: MEDICARE

## 2018-11-28 ENCOUNTER — Emergency Department: Admit: 2018-11-28 | Discharge: 2018-11-28 | Payer: MEDICARE

## 2018-11-28 ENCOUNTER — Inpatient Hospital Stay: Admit: 2018-11-28 | Payer: MEDICARE

## 2018-11-28 DIAGNOSIS — F988 Other specified behavioral and emotional disorders with onset usually occurring in childhood and adolescence: Secondary | ICD-10-CM

## 2018-11-28 DIAGNOSIS — R339 Retention of urine, unspecified: Secondary | ICD-10-CM

## 2018-11-28 DIAGNOSIS — Q059 Spina bifida, unspecified: Secondary | ICD-10-CM

## 2018-11-28 DIAGNOSIS — N319 Neuromuscular dysfunction of bladder, unspecified: Secondary | ICD-10-CM

## 2018-11-28 DIAGNOSIS — R569 Unspecified convulsions: Secondary | ICD-10-CM

## 2018-11-28 DIAGNOSIS — N390 Urinary tract infection, site not specified: Secondary | ICD-10-CM

## 2018-11-28 DIAGNOSIS — Z01810 Encounter for preprocedural cardiovascular examination: Secondary | ICD-10-CM

## 2018-11-28 DIAGNOSIS — N200 Calculus of kidney: Secondary | ICD-10-CM

## 2018-11-28 DIAGNOSIS — R51 Headache: Secondary | ICD-10-CM

## 2018-11-28 DIAGNOSIS — I429 Cardiomyopathy, unspecified: Secondary | ICD-10-CM

## 2018-11-28 DIAGNOSIS — T85618A Breakdown (mechanical) of other specified internal prosthetic devices, implants and grafts, initial encounter: Secondary | ICD-10-CM

## 2018-11-28 DIAGNOSIS — G4733 Obstructive sleep apnea (adult) (pediatric): Secondary | ICD-10-CM

## 2018-11-28 DIAGNOSIS — T50905A Adverse effect of unspecified drugs, medicaments and biological substances, initial encounter: Secondary | ICD-10-CM

## 2018-11-28 DIAGNOSIS — R1012 Left upper quadrant pain: Secondary | ICD-10-CM

## 2018-11-28 DIAGNOSIS — I10 Essential (primary) hypertension: Secondary | ICD-10-CM

## 2018-11-28 DIAGNOSIS — N12 Tubulo-interstitial nephritis, not specified as acute or chronic: Secondary | ICD-10-CM

## 2018-11-28 DIAGNOSIS — K56609 Unspecified intestinal obstruction, unspecified as to partial versus complete obstruction: Secondary | ICD-10-CM

## 2018-11-28 DIAGNOSIS — Q0700 Arnold-Chiari syndrome without spina bifida or hydrocephalus: Secondary | ICD-10-CM

## 2018-11-28 DIAGNOSIS — R000 Tachycardia, unspecified: Secondary | ICD-10-CM

## 2018-11-28 DIAGNOSIS — G919 Hydrocephalus, unspecified: Secondary | ICD-10-CM

## 2018-11-28 LAB — COMPREHENSIVE METABOLIC PANEL
Lab: 103 MMOL/L (ref 98–110)
Lab: 135 MMOL/L — ABNORMAL LOW (ref 137–147)
Lab: 4.3 MMOL/L (ref 3.5–5.1)

## 2018-11-28 LAB — CBC AND DIFF
Lab: 0 10*3/uL (ref 0–0.20)
Lab: 0.1 10*3/uL (ref 0–0.45)
Lab: 11 10*3/uL — ABNORMAL HIGH (ref 4.5–11.0)
Lab: 16 (ref <20.7)

## 2018-11-28 LAB — URINALYSIS DIPSTICK REFLEX TO CULTURE
Lab: NEGATIVE K/UL — ABNORMAL HIGH (ref 3–12)
Lab: NEGATIVE U/L (ref 7–40)
Lab: NEGATIVE U/L — ABNORMAL LOW (ref 25–110)
Lab: NEGATIVE g/dL — ABNORMAL HIGH (ref 3.5–5.0)
Lab: NEGATIVE mL/min (ref 0–0.80)

## 2018-11-28 LAB — POC LACTATE: Lab: 1.6 MMOL/L (ref 0.5–2.0)

## 2018-11-28 LAB — COVID-19 (SARS-COV-2) PCR

## 2018-11-28 LAB — URINALYSIS MICROSCOPIC REFLEX TO CULTURE

## 2018-11-28 LAB — LIPASE: Lab: 29 U/L — ABNORMAL HIGH (ref 11–82)

## 2018-11-28 LAB — POC TROPONIN: Lab: 0 ng/mL (ref 0.00–0.05)

## 2018-11-28 MED ORDER — IOHEXOL 350 MG IODINE/ML IV SOLN
100 mL | Freq: Once | INTRAVENOUS | 0 refills | Status: CP
Start: 2018-11-28 — End: ?
  Administered 2018-11-28: 13:00:00 100 mL via INTRAVENOUS

## 2018-11-28 MED ORDER — CARVEDILOL 6.25 MG PO TAB
6.25 mg | Freq: Two times a day (BID) | ORAL | 0 refills | Status: DC
Start: 2018-11-28 — End: 2018-11-30
  Administered 2018-11-28 – 2018-11-30 (×5): 6.25 mg via ORAL

## 2018-11-28 MED ORDER — LEVETIRACETAM 500 MG PO TAB
500 mg | Freq: Two times a day (BID) | ORAL | 0 refills | Status: DC
Start: 2018-11-28 — End: 2018-11-30
  Administered 2018-11-28 – 2018-11-30 (×5): 500 mg via ORAL

## 2018-11-28 MED ORDER — POLYETHYLENE GLYCOL 3350 17 GRAM PO PWPK
1 | Freq: Every day | ORAL | 0 refills | Status: DC
Start: 2018-11-28 — End: 2018-11-28

## 2018-11-28 MED ORDER — FENTANYL CITRATE (PF) 50 MCG/ML IJ SOLN
75 ug | Freq: Once | INTRAVENOUS | 0 refills | Status: CP
Start: 2018-11-28 — End: ?
  Administered 2018-11-28: 13:00:00 75 ug via INTRAVENOUS

## 2018-11-28 MED ORDER — LACTATED RINGERS IV SOLP
INTRAVENOUS | 0 refills | Status: DC
Start: 2018-11-28 — End: 2018-11-30
  Administered 2018-11-28 – 2018-11-30 (×3): 1000.000 mL via INTRAVENOUS

## 2018-11-28 MED ORDER — CYANOCOBALAMIN (VITAMIN B-12) 500 MCG PO TAB
1000 ug | Freq: Every day | ORAL | 0 refills | Status: DC
Start: 2018-11-28 — End: 2018-11-30
  Administered 2018-11-28 – 2018-11-30 (×3): 1000 ug via ORAL

## 2018-11-28 MED ORDER — ACETAMINOPHEN 325 MG PO TAB
650 mg | ORAL | 0 refills | Status: DC | PRN
Start: 2018-11-28 — End: 2018-11-30
  Administered 2018-11-28 – 2018-11-29 (×2): 650 mg via ORAL

## 2018-11-28 MED ORDER — OXYCODONE 5 MG PO TAB
5 mg | Freq: Once | ORAL | 0 refills | Status: CP
Start: 2018-11-28 — End: ?
  Administered 2018-11-28: 5 mg via ORAL

## 2018-11-28 MED ORDER — ENOXAPARIN 40 MG/0.4 ML SC SYRG
40 mg | Freq: Every day | SUBCUTANEOUS | 0 refills | Status: DC
Start: 2018-11-28 — End: 2018-11-28

## 2018-11-28 MED ORDER — BISACODYL 10 MG RE SUPP
10 mg | Freq: Once | RECTAL | 0 refills | Status: AC
Start: 2018-11-28 — End: ?

## 2018-11-28 MED ORDER — ACETAMINOPHEN/LIDOCAINE/ANTACID DS(#) 1:1:3  PO SUSP
30 mL | Freq: Once | ORAL | 0 refills | Status: AC
Start: 2018-11-28 — End: ?

## 2018-11-28 MED ORDER — SENNOSIDES 8.6 MG PO TAB
1 | Freq: Two times a day (BID) | ORAL | 0 refills | Status: DC
Start: 2018-11-28 — End: 2018-11-30
  Administered 2018-11-28 – 2018-11-30 (×3): 1 via ORAL

## 2018-11-28 MED ORDER — SODIUM CHLORIDE 0.9 % IJ SOLN
50 mL | Freq: Once | INTRAVENOUS | 0 refills | Status: CP
Start: 2018-11-28 — End: ?
  Administered 2018-11-28: 13:00:00 50 mL via INTRAVENOUS

## 2018-11-28 MED ORDER — AMPICILLIN 1G/100ML NS IVPB (MB+)
1 g | INTRAVENOUS | 0 refills | Status: DC
Start: 2018-11-28 — End: 2018-11-28

## 2018-11-28 MED ORDER — LOSARTAN 25 MG PO TAB
25 mg | Freq: Every day | ORAL | 0 refills | Status: DC
Start: 2018-11-28 — End: 2018-11-30
  Administered 2018-11-28 – 2018-11-30 (×3): 25 mg via ORAL

## 2018-11-28 MED ORDER — POLYETHYLENE GLYCOL 3350 17 GRAM PO PWPK
1 | Freq: Two times a day (BID) | ORAL | 0 refills | Status: DC
Start: 2018-11-28 — End: 2018-11-30
  Administered 2018-11-29: 03:00:00 17 g via ORAL

## 2018-11-28 MED ORDER — ENOXAPARIN 40 MG/0.4 ML SC SYRG
40 mg | Freq: Two times a day (BID) | SUBCUTANEOUS | 0 refills | Status: DC
Start: 2018-11-28 — End: 2018-11-30
  Administered 2018-11-28 – 2018-11-30 (×5): 40 mg via SUBCUTANEOUS

## 2018-11-28 MED ORDER — CEFTRIAXONE INJ 1GM IVP
1 g | INTRAVENOUS | 0 refills | Status: DC
Start: 2018-11-28 — End: 2018-11-29
  Administered 2018-11-28: 23:00:00 1 g via INTRAVENOUS

## 2018-11-28 MED ORDER — SPIRONOLACTONE 25 MG PO TAB
25 mg | Freq: Every day | ORAL | 0 refills | Status: DC
Start: 2018-11-28 — End: 2018-11-30
  Administered 2018-11-28 – 2018-11-30 (×3): 25 mg via ORAL

## 2018-11-28 MED ORDER — CHOLECALCIFEROL (VITAMIN D3) 25 MCG (1,000 UNIT) PO TAB
2000 [IU] | Freq: Every day | ORAL | 0 refills | Status: DC
Start: 2018-11-28 — End: 2018-11-30
  Administered 2018-11-28 – 2018-11-30 (×3): 2000 [IU] via ORAL

## 2018-11-28 NOTE — ED Notes
Patient has PMH of Spjna Bifida, Hydrocephalus, and CKD.  He was diagnosed with a UTI last Wednesday.  He was started on Keflex QID and has been taking them as directed.    Around 11pm, patient noticed sudden onset of upper abdominal pain rated at 2-3 in his stomach and back.  Approximately 1-2 hrs ago, pain spiked up to 6, so he called EMS.  Patient states this feels like pain r/t a pseudocyst in/near the end of his right VP shunt.  At home patient started feeling very hot with increasing back/abdominal pain, even with the air on and temperature set to 96 degrees, patient states he could not cool off.

## 2018-11-28 NOTE — H&P (View-Only)
Admission History and Physical    Name:  Allen Gill                                                     MRN:  [id number]   Admission Date:  11/28/2018  Admission Diagnosis: Small bowel obstruction (HCC) [K56.609]     Principal Problem:    Small bowel obstruction (HCC)  Active Problems:    Morbid obesity (HCC)    S/P ileal conduit (HCC)    Chronic combined systolic (congestive) and diastolic (congestive) heart failure (HCC)      Assessment/Plan:       This is a 28 year old male with PMH of Spina bifida, Hydrocephalus/chiari malformation s/p VP shunt, neurogenic bladder s/p cystectomy/ileal conduit, recurrent UTIs/Pyleonephritis, chronic combined CHF s/p EF recovery who presented to the ED with epigastric abdominal pain and R flank/CVA pain (partly chronic). He was found to have ?L pyelonephritis and possible partial SBO vs ileus    Epigastric abdominal pain, concern for partial SBO/ileus  - THe patient had a BM @ 1AM, is passing gas. Abdomen is feels about the same as usual  - CT a/p with dilated proximal small bowel/decompressed distal small bowe  > NPO  > IVF  > Start Miralax and Bisacodyl supp  > Consult surgery    R CVA pain  Hx of neurogenic bladder s/p cystectomy/ileal conduit  - Chronic per patient but has acute worsening the past few hours. No L CVA pain/tenderness  - WBC 11.7 but appears hemoconcentration too on labs  - UA with 2+ WBC/bacteria  - CT showed findings suggestive of ureteral inflammation/pyelonephritis  - He has no tenderness around his ileal conduit  > Consult ID     Chronic combined CHF with EF recovery  - No acute events  > Continue PTA meds    Spina bifida  Chiari malformation/hydrocephalus s/p VP shunts  Epilepsy - Contineue Keppra    FEN  - 100 cc/hr of LR  - Dailyt labs  - NPO for now    VTE PPX  - Lovenox    LDA  - PIV    Dispo  - Admit to IM    Code status: Full      Subjective:    CC Abdominal pain HPI: Allen Gill is a 28 y.o. male  The patient presents to the ED today with abdominal pain. He stared having mid epigastric abdominal pain along with worsening R CVA pain around 6 pm last night. Progressed throughout the night and by 1 am he was having severe 7/10 pain. The pain in the epigastric is sharp pain. He has no pain around the ileal conduit. The pain in the R CVA is my kidney pain which he says is very typical for him when he gets UTI. He is afebrile in the ED but says that he has been sweating a lot.     He was seen in the ED a week ago for slurred speech/headaches. He thought it his VP shunt but NS saw him and felt his shunt is functioning appropriately. His UA at that time showed bacteria, 3+ leukocytes but the urine cultures grew > 3 bacteria which was felt is a contaminant. He was discharged on Keflex.  He just started taking it and then his Sx above came on.  Medical History:   Diagnosis Date   ? 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 Arnold-Chiari malformation (HCC)    ? Cardiomyopathy (HCC)    ? Headache(784.0)    ? Hydrocephalus (HCC)    ? Hypertension    ? Infection of VP shunt    ? Kidney stones     frequent   ? Myelomeningocele (HCC)    ? Neurogenic bladder    ? OSA (obstructive sleep apnea)    ? Preop cardiovascular exam 07/30/2016   ? Seizures (HCC)     blank staring spells in childhood   ? Shunt malfunction    ? Spina bifida (HCC)    ? Tachycardia 07/30/2016   ? Urinary retention 02/19/2016    Added automatically from request for surgery [id number]   ? Urinary tract infection     frequent        Surgical History:   Procedure Laterality Date   ? SHUNT CREATION VENTRICULAR PERITONEAL Right 06/14/2014    Performed by Angelia Mould, MD at [facility] OR   ? SHUNT REVISION VENTRICULAR PERITONEAL Right 04/23/2015    Performed by Angelia Mould, MD at [facility] OR   ? REMOVAL HARDWARE HEAD: removal of left ventriculopleural shunt Left 05/20/2015 Performed by Angelia Mould, MD at [facility] OR   ? SHUNT REMOVAL VENTRICULAR PERITONEAL Right 05/23/2015    Per[facility] by [facility]ould, MD at [facility] OR   ? SHUNT CREATION VENTRICULAR PERITONEAL Left 06/03/2015    Performed by Angelia Mould, MD at [facility] OR   ? CYSTOURETHROSCOPY, CYSTOLITHOLAPAXY N/A 08/05/2015    Performed by Pennie Banter, MD at [facility] OR   ? HOLMIUM LASER ENUCLEATION OF PROSTATE (NO MORCELLATION) N/A 08/19/2015    Performed by Vonna Drafts, MD at [facility] OR   ? CYSTOSCOPY N/A 08/19/2015    Performed by Vonna Drafts, MD at [facility] OR   ? CYSTOSCOPY EVACUATION CLOTS N/A 08/29/2015    Performed by Vonna Drafts, MD at [facility] OR   ? CYSTOSCOPY, URETHERAL DILATION N/A 02/20/2016    Performed by Vonna Drafts, MD at [facility] OR   ? CYSTECTOMY, ILEAL CONDUIT N/A 10/13/2016    Performed by Glennie Isle, MD at [facility] OR   ? REVISION SHUNT - VENTRICULO-PERITONEAL Left 12/16/2016    Performed by Storm Frisk, MD at [facility] OR   ? CREATION SHUNT - VENTRICULO-PERITONEAL: Left side. 2 hours, proximal catheter and valve in place. will need distal catheter placed, Dr. Joan Flores to complete. Supine Left 12/22/2016    Performed by Angelia Mould, MD at [facility] OR   ? PR REPRGRMG PROGRAMMABLE CEREBROSPINAL SHUNT  01/07/2017   ? PERCUTANEOUS NEPHROSTOLITHOTOMY/ PYELOSTOLITHOTOMY - GREATER THAN 2 CM Left 05/25/2017    Performed by Clarita Crane, MD, [facility] at [facility] OR   ? CREATION SHUNT - VENTRICULO-PERITONEAL Right 08/04/2017    Performed by Angelia Mould, MD at [facility] OR   ? CATHETER IMPLANT/REVISION  12/27/09    distal end of the catheter was revised   ? CATHETER IMPLANT/REVISION  01/31/10    replacement of ventricular catheter with Brain[employer] framelessstereotaxis catheter   ? HX ABDOMEN SURGERY      fundoplication   ? HX ABDOMEN SURGERY  01/2001    Cecostomy   ? HX BACK SURGERY      repair of spina bifida   ? HX BRAIN SURGERY  5 months old    Chiari decompression   ? HX EAR TUBES     ?  HX HERNIA REPAIR      inguinal hernia   ? HX SURGERY  at 2 weeks VP Shunt   ? HX TONSILLECTOMY     ? HX TRACHEOSTOMY     ? SHUNT REVISION  3 months old   ? SHUNT REVISION  28 years old   ? SHUNT REVISION  October 2010    [place]   ? SHUNT REVISION  12/11/09    replacment of valve to acodman hakem adjustablevalve   ? SHUNT REVISION  02/17/10    left frontal ventriculopleural shunt   ? VENTRICULOSTOMY  02/03/10    removal of all shunt components and placementofright frontal ventriculostomy     Family History   Problem Relation Age of Onset   ? Diabetes Father    ? Hypertension Father    ? Other Father         glaucoma   ? High Cholesterol Maternal Grandfather             Social History     Socioeconomic History   ? Marital status: Single     Spouse name: Not on file   ? Number of children: Not on file   ? Years of education: Not on file   ? Highest education level: Not on file   Occupational History   ? 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 Smoking status: Never Smoker   ? Smokeless tobacco: Never Used   Substance and Sexual Activity   ? Alcohol use: No   ? Drug use: No   ? Sexual activity: Not Currently     Partners: Female   Other Topics Concern   ? Not on file   Social History Narrative   ? Not on file          Allergies   Allergen Reactions   ? Latex RASH and SHORTNESS OF BREATH   ? Amoxicillin RASH and STOMACH UPSET     On 08/03/17 I discussed this with the patient. Amox/clav prescribed in 2014. He certainly took it.  Notes from that time indicate diarrhea. No comment on any other problem such as HA or rash. I discussed this with him on 03/13/18 and he stated that he'd taken amoxicillin three times in the past and each time had HA, debilitating fatigue and diarrhea. And maybe rash. At this time he was tolerating IV ampicillin. Manya Silvas, MD 03/13/18    > Patient tolerated ampicillin March 2020   ? Ceclor [Cefaclor] HIVES     Pt has tolerated ancef, keflex, and cefepime   ? Clindamycin HIVES   ? Zosyn [Piperacillin-Tazobactam] HIVES        Review of Systems System   Positives   Negatives     Constitutional:   Fever, chills, unintentional weight loss    Eyes:   Diplopia, blurry vision    ENT:   Congestion, sinus pain, ear pain, ear discharge   Respiratory:   Cough, Shortness of breath   Cardiovascular:   Palpitations, claudication, Chest pain   Gastrointestinal:  Abdomina pain/nausea Vomiting, hematemesis, melena    Genitourinary:   Dysuria, blood in urine, incontinence   Hematologic:   Bleeding dyscrasias, easy bruising    Musculoskeletal:  back pain myalgias, joints swelling, joints erythema   Neurological:   Headache, numbness, tingling, loss of consciousness, seizures, tremors    Behavioral/Psych:   Suicidal ideation, hallucinations   Skin:   Rash, itching, skin abrasions, infected skin lesions  Objective:      Vital Signs: Last Filed In 24 Hours Vital Signs: 24 Hour Range   BP: 131/82 (11/17 0711)  Temp: 36.7 ?C (98.1 ?F) (11/17 0327)  Pulse: 102 (11/17 0711)  Respirations: 11 PER MINUTE (11/17 0711)  SpO2: 97 % (11/17 0711)  SpO2 Pulse: 99 (11/17 0711) BP: (118-131)/(74-92)   Temp:  [36.7 ?C (98.1 ?F)]   Pulse:  [100-106]   Respirations:  [11 PER MINUTE-18 PER MINUTE]   SpO2:  [93 %-97 %]      PHYSICAL EXAMINATION:    General Appearance: Cooperative, NAD  Neck: no JVD  Eyes:  EOMs intact.   Lungs: CTAB  Heart: RRR  Abdomen: Firm but this is chronci per patient, no tenderness around the ileal conduit. TTP in the epigastric area and the R CVA  Extremities: Atraumatic, no edema  Neurologic: A, OX3,  no gross focal deficits        LABS:  Recent Labs     11/28/18  0530   NA 135*   K 4.3   CL 103   CO2 25   GAP 7   BUN 26*   CR 0.64   GLU 174*   CA 9.0   ALBUMIN 4.0       Recent Labs     11/28/18  0530   WBC 11.7*   HGB 13.6   HCT 41.3   PLTCT 517*   AST 19   ALT 35   ALKPHOS 99      Estimated Creatinine Clearance: 176.6 mL/min (based on SCr of 0.64 mg/dL).  Vitals:    11/28/18 0327   Weight: 113.4 kg (250 lb) No results for input(s): PHART, PO2ART in the last 72 hours.    Invalid input(s): PC02A                       MEDSacetaminophen/lidocaine/antacid DS(#), 30 mL, Oral, ONCE  ampicillin  IVPB, 1 g, Intravenous, Q6H*  bisacodyL, 10 mg, Rectal, ONCE  polyethylene glycol 3350, 1 packet, Oral, QDAY     IV MEDS  ? lactated ringers infusion       Prn      HOME MEDS   No current facility-administered medications on file prior to encounter.      Current Outpatient Medications on File Prior to Encounter   Medication Sig Dispense Refill   ? acetaminophen (TYLENOL) 325 mg tablet Take two tablets by mouth every 6 hours as needed.  0   ? carvediloL (COREG) 6.25 mg tablet Take one tablet by mouth twice daily with meals. 180 tablet 3   ? cephalexin (KEFLEX) 500 mg capsule Take one capsule by mouth four times daily for 10 days. Indications: bacterial urinary tract infection 40 capsule 0   ? cholecalciferol(+) (VITAMIN D3) 2,000 unit tablet Take one tablet by mouth daily. 90 tablet 0   ? cyanocobalamin (VITAMIN B-12) 1,000 mcg tablet Take one tablet by mouth daily. 30 tablet 0   ? Leg Brace misc Fitted leg brace and shoe for foot drop.  Dx: spina bifida. 1 each 0   ? levETIRAcetam (KEPPRA) 500 mg tablet Take one tablet by mouth twice daily. 120 tablet 3   ? losartan (COZAAR) 25 mg tablet Take one tablet by mouth daily. 90 tablet 3   ? Ostomy Supplies 1  misc Hollister 1 piece Urostomy bag.  Use 1 bag as needed. 10 each 10   ? spironolactone (ALDACTONE) 25 mg tablet Take one tablet  by mouth daily. Take with food. 90 tablet 3          Ct Abd/pelv W Contrast    Result Date: 11/28/2018 1.  Mild urothelial thickening and perinephric fat stranding centered about the right renal pelvis as well as a minimal focus of ill-defined decreased enhancement in the upper pole of the left kidney. These findings are suggestive of infection/inflammation (pyelitis and pyelonephritis). Correlation with a UA is recommended. 2.  Development of mildly dilated proximal small bowel with completely decompressed distal small bowel. This pattern is suggestive of a low-grade bowel obstruction. Atypical appearing ileus is an additional consideration. 3.  Development of minimal ascites 4.  Prior cystectomy and ileal conduit 5.  Persistent right lower quadrant colocutaneous fistula By my electronic signature, I attest that I have personally reviewed the images for this examination and formulated the interpretations and opinions expressed in this report  Finalized by Rosilyn Mings, M.D. on 11/28/2018 8:10 AM. Dictated by Gilmer Mor, MD on 11/28/2018 7:09 AM.

## 2018-11-28 NOTE — Consults
[facility] Consult     Patient: Allen Gill, [id number]  Admission Date:  11/28/2018, LOS: 0 days  Admission Diagnosis: Small bowel obstruction [facility]) [K56.609]  Date of Service: November 28, 2018  Consults   ASSESSMENT: 28 y.o. male with PMH significant for Spina bifida, Hydrocephalus/chiari malformation s/p VP shunt, neurogenic bladder s/p cystectomy/ileal conduit, recurrent UTIs/Pyleonephritis, chronic combined CHF who presents with significant RUQ and R flank pain. Given that patient has passed gas and had a bowel movement this morning he clinically is not obstructed.     PLAN:  - no acute surgical intervention needed at this time  - given that patient is passing gas and had a BM this morning he is not clinically obstructed   - given large amount of stool in colon would recommend bowel regimen  - would not recommend NGT at this time   - surgery will sign off     Discussed plan of care with staff surgeon, Dr. Merleen Milliner, who directed plan of care  _____________________________________________________________________________    HPI: Allen Gill is a 28 y.o. male with PMH significant for Spina bifida, Hydrocephalus/chiari malformation s/p VP shunt, neurogenic bladder s/p cystectomy/ileal conduit, recurrent UTIs/Pyleonephritis, chronic combined CHF who presents with significant RUQ and R flank pain. He states that his abdominal and R flank pain started last night and has worsened since then. He states that this pain is similar to how he felt when he had a pseudocyst. He states that he has had some nausea. Has had flatus and had a small BM earlier this morning. States that he does not feel bloated. CT demonstrates mildly dilated proximal small bowel with completely decompressed distal small bowel with some concern for partial small bowel obstruction.       Medical History:   Diagnosis Date   ? 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 Arnold-Chiari malformation (HCC) ? Cardiomyopathy (HCC)    ? Headache(784.0)    ? Hydrocephalus (HCC)    ? Hypertension    ? Infection of VP shunt    ? Kidney stones     frequent   ? Myelomeningocele (HCC)    ? Neurogenic bladder    ? OSA (obstructive sleep apnea)    ? Preop cardiovascular exam 07/30/2016   ? Seizures (HCC)     blank staring spells in childhood   ? Shunt malfunction    ? Spina bifida (HCC)    ? Tachycardia 07/30/2016   ? Urinary retention 02/19/2016    Added automatically from request for surgery [id number]   ? Urinary tract infection     frequent     Surgical History:   Procedure Laterality Date   ? SHUNT CREATION VENTRICULAR PERITONEAL Right 06/14/2014    Performed by Angelia Mould, MD at [facility] OR   ? SHUNT REVISION VENTRICULAR PERITONEAL Right 04/23/2015    Performed by Angelia Mould, MD at [facility] OR   ? REMOVAL HARDWARE HEAD: removal of left ventriculopleural shunt Left 05/20/2015    Performed by Angelia Mould, MD at [facility] OR   ? SHUNT REMOVAL VENTRICULAR PERITONEAL Right 05/23/2015    Performed by Angelia Mould, MD at [facility] OR   ? SHUNT CREATION VENTRICULAR PERITONEAL Left 06/03/2015    Performed by Angelia Mould, MD at [facility] OR   ? CYSTOURETHROSCOPY, CYSTOLITHOLAPAXY N/A 08/05/2015    Performed by Pennie Banter, MD at [facility] OR   ? HOLMIUM LASER ENUCLEATION OF PROSTATE (NO MORCELLATION) N/A 08/19/2015  Performed by Vonna Drafts, MD at [facility] OR   ? CYSTOSCOPY N/A 08/19/2015    Performed by Vonna Drafts, MD at [facility] OR   ? CYSTOSCOPY EVACUATION CLOTS N/A 08/29/2015    Performed by Vonna Drafts, MD at [facility] OR   ? CYSTOSCOPY, URETHERAL DILATION N/A 02/20/2016    Performed by Vonna Drafts, MD at [facility] OR   ? CYSTECTOMY, ILEAL CONDUIT N/A 10/13/2016    Performed by Glennie Isle, MD at [facility] OR   ? REVISION SHUNT - VENTRICULO-PERITONEAL Left 12/16/2016    Performed by Storm Frisk, MD at [facility] OR ? CREATION SHUNT - VENTRICULO-PERITONEAL: Left side. 2 hours, proximal catheter and valve in place. will need distal catheter placed, Dr. Joan Flores to complete. Supine Left 12/22/2016    Performed by Angelia Mould, MD at [facility] OR   ? PR REPRGRMG PROGRAMMABLE CEREBROSPINAL SHUNT  01/07/2017   ? PERCUTANEOUS NEPHROSTOLITHOTOMY/ PYELOSTOLITHOTOMY - GREATER THAN 2 CM Left 05/25/2017    Performed by Clarita Crane, MD, [facility] at [facility] OR   ? CREATION SHUNT - VENTRICULO-PERITONEAL Right 08/04/2017    Performed by Angelia Mould, MD at [facility] OR   ? CATHETER IMPLANT/REVISION  12/27/09    distal end of the catheter was revised   ? CATHETER IMPLANT/REVISION  01/31/10    replacement of ventricular catheter with [employer] framelessstereotaxis catheter   ? HX ABDOMEN SURGERY      fundoplication   ? HX ABDOMEN SURGERY  01/2001    Cecostomy   ? HX BACK SURGERY      repair of spina bifida   ? HX BRAIN SURGERY  5 months old    Chiari decompression   ? HX EAR TUBES     ? HX HERNIA REPAIR      inguinal hernia   ? HX SURGERY  at 2 weeks    VP Shunt   ? HX TONSILLECTOMY     ? HX TRACHEOSTOMY     ? SHUNT REVISION  3 months old   ? SHUNT REVISION  28 years old   ? SHUNT REVISION  October 2010    [place]   ? SHUNT REVISION  12/11/09    replacment of valve to acodman hakem adjustablevalve   ? SHUNT REVISION  02/17/10    left frontal ventriculopleural shunt   ? VENTRICULOSTOMY  02/03/10    removal of all shunt components and placementofright frontal ventriculostomy     Family History   Problem Relation Age of Onset   ? Diabetes Father    ? Hypertension Father    ? Other Father         glaucoma   ? High Cholesterol Maternal Grandfather      Social History     Tobacco Use   ? Smoking status: Never Smoker   ? Smokeless tobacco: Never Used   Substance Use Topics   ? Alcohol use: No     Your Current Medications:       Instructions    acetaminophen (TYLENOL) 325 mg tablet Take two tablets by mouth every 6 hours as needed. carvediloL (COREG) 6.25 mg tablet Take one tablet by mouth twice daily with meals.    cephalexin (KEFLEX) 500 mg capsule Take one capsule by mouth four times daily for 10 days. Indications: bacterial urinary tract infection    cholecalciferol(+) (VITAMIN D3) 2,000 unit tablet Take one tablet by mouth daily.    cyanocobalamin (VITAMIN B-12) 1,000 mcg tablet Take one tablet by mouth daily.  fish oil /omega-3 fatty acids (SEA-OMEGA) 340/1000 mg capsule Take 1 capsule by mouth daily.    Leg Brace misc Fitted leg brace and shoe for foot drop.  Dx: spina bifida.    levETIRAcetam (KEPPRA) 500 mg tablet Take one tablet by mouth twice daily.    losartan (COZAAR) 25 mg tablet Take one tablet by mouth daily.    multivitamin with iron (MULTIPLE VITAMINS WITH IRON PO) Take 1 tablet by mouth daily.    Ostomy Supplies 1  misc Hollister 1 piece Urostomy bag.  Use 1 bag as needed.    spironolactone (ALDACTONE) 25 mg tablet Take one tablet by mouth daily. Take with food.        ROS:  Review of Systems   Gastrointestinal: Positive for abdominal pain and nausea.     BP: (115-131)/(74-92)   Temp:  [36.7 ?C (98.1 ?F)]   Pulse:  [85-106]   Respirations:  [11 PER MINUTE-18 PER MINUTE]   SpO2:  [93 %-97 %]   Physical Exam   Constitutional: No distress.   HENT:   Head: Normocephalic and atraumatic.   Eyes: EOM are normal.   Neck: Neck supple.   Cardiovascular: Normal rate and regular rhythm.   Pulmonary/Chest: Effort normal.   Abdominal:   Soft, non distended, significant tenderness to palpation in epigastrium and RUQ   Musculoskeletal: Normal range of motion.   Skin: Skin is warm and dry.     Lab/Radiology/Other Diagnostic Tests:  BP: (115-131)/(74-92)   Temp:  [36.7 ?C (98.1 ?F)]   Pulse:  [85-106]   Respirations:  [11 PER MINUTE-18 PER MINUTE]   SpO2:  [93 %-97 %]   Lab Results   Component Value Date/Time    NA 135 (L) 11/28/2018 05:30 AM    K 4.3 11/28/2018 05:30 AM    CL 103 11/28/2018 05:30 AM    CO2 25 11/28/2018 05:30 AM BUN 26 (H) 11/28/2018 05:30 AM    CR 0.64 11/28/2018 05:30 AM    MG 2.0 11/21/2018 06:32 PM    PO4 2.9 10/15/2016 05:21 AM      Lab Results   Component Value Date/Time    HGB 13.6 11/28/2018 05:30 AM    HCT 41.3 11/28/2018 05:30 AM    WBC 11.7 (H) 11/28/2018 05:30 AM    PLTCT 517 (H) 11/28/2018 05:30 AM    INR 1.1 12/16/2016 08:30 AM     Lab Results   Component Value Date/Time    GLUPOC 142 (H) 04/14/2017 09:34 AM    GLUPOC 131 (H) 05/21/2015 06:13 AM    GLUPOC 136 (H) 05/20/2015 08:11 PM        CT ABD/PELV W CONTRAST   Final Result      1.  Mild urothelial thickening and perinephric fat stranding centered about the right renal pelvis as well as a minimal focus of ill-defined decreased enhancement in the upper pole of the left kidney. These findings are suggestive of infection/inflammation (pyelitis and pyelonephritis). Correlation with a UA is recommended.   2.  Development of mildly dilated proximal small bowel with completely decompressed distal small bowel. This pattern is suggestive of a low-grade bowel obstruction. Atypical appearing ileus is an additional consideration.   3.  Development of minimal ascites   4.  Prior cystectomy and ileal conduit   5.  Persistent right lower quadrant colocutaneous fistula      By my electronic signature, I attest that I have personally reviewed the images for this examination and formulated the interpretations  and opinions expressed in this report          Finalized by Rosilyn Mings, M.D. on 11/28/2018 8:10 AM. Dictated by Gilmer Mor, MD on 11/28/2018 7:09 AM.             Skip Estimable, MD  Personal Pager: # [phone]

## 2018-11-28 NOTE — ED Notes
Patient's belongings include: red shirt, black shorts, socks, tennis shoes, bilateral leg braces, glasses (wearing), keys on lanyard, safety button to call EMS, red/gray striped jacket, cell phone with charger, and a backpack of belongings.

## 2018-11-28 NOTE — ED Notes
Report to Yifan, RN.

## 2018-11-29 ENCOUNTER — Encounter: Admit: 2018-11-29 | Discharge: 2018-11-29 | Payer: MEDICARE

## 2018-11-29 MED ORDER — CEPHALEXIN 500 MG PO CAP
500 mg | Freq: Four times a day (QID) | ORAL | 0 refills | Status: DC
Start: 2018-11-29 — End: 2018-11-30
  Administered 2018-11-29 – 2018-11-30 (×3): 500 mg via ORAL

## 2018-11-29 NOTE — Progress Notes
RT Adult Assessment Note    NAME:Allen Gill             MRN: [id number]             DOB:05/21/1990          AGE: 28 y.o.  ADMISSION DATE: 11/28/2018             DAYS ADMITTED: LOS: 0 days    RT Treatment Plan:       Protocol Plan: Procedures  SpO2: PRN;Continuous (Document SpO2 result Qshift)    Additional Comments:  Impressions of the patient: No signs of respiratory distress. OSA Hx. Does not use CPAP at home.   Intervention(s)/outcome(s): SpO2 check PRN.  Patient education that was completed: none  Recommendations to the care team: none    Vital Signs:  Pulse: 86   RR: 15 PER MINUTE  SpO2: 94 %  O2 Device:  room air  Liter Flow:    O2%: 21 %  Breath Sounds: Clear (Implies normal)  Respiratory Effort: Non-Labored

## 2018-11-29 NOTE — Progress Notes
I spoke with Med Private O on call. Okay for regular diet.  Pain not managed with tylenol.   Order for oxy obtained.  Ileal conduit draining.  Patient reports passing gas. Refusing suppository.  Patient denies nausea and vomiting.     LR @100 

## 2018-11-29 NOTE — Progress Notes
Patient arrived to room #HC402 via wheelchair accompanied by transport. Patient transferred to the chair with assistance. Bedside safety checks completed. Initial patient assessment completed. Refer to flowsheet for details.    Admission skin assessment completed with: Joycelyn Schmid, RN    Pressure injury present on arrival?: No    1. Head/Face/Neck: No  2. Trunk/Back: No  3. Upper Extremities: No  4. Lower Extremities: No  5. Pelvic/Coccyx: No  6. Assessed for device associated injury? Yes  7. Malnutrition Screening Tool (Nursing Nutrition Assessment) Completed? Yes    See Doc Flowsheet for additional wound details.     INTERVENTIONS:

## 2018-11-30 ENCOUNTER — Encounter: Admit: 2018-11-30 | Discharge: 2018-11-30 | Payer: MEDICARE

## 2018-11-30 MED ORDER — SENNOSIDES 8.6 MG PO TAB
1 | ORAL_TABLET | Freq: Two times a day (BID) | ORAL | 0 refills | Status: DC
Start: 2018-11-30 — End: 2019-05-31

## 2018-11-30 MED ORDER — POLYETHYLENE GLYCOL 3350 17 GRAM PO PWPK
17 g | Freq: Two times a day (BID) | ORAL | 0 refills | 18.00000 days | Status: DC
Start: 2018-11-30 — End: 2019-05-31

## 2018-11-30 MED ORDER — CEPHALEXIN 500 MG PO CAP
500 mg | ORAL_CAPSULE | Freq: Four times a day (QID) | ORAL | 0 refills | Status: DC
Start: 2018-11-30 — End: 2018-12-05
  Filled 2018-11-30: qty 28, 7d supply, fill #1

## 2018-12-01 ENCOUNTER — Encounter: Admit: 2018-12-01 | Discharge: 2018-12-01 | Payer: MEDICARE

## 2018-12-01 NOTE — Telephone Encounter
Patient discharged on: 11/30/18.  Antibiotic per Dr. Magnus Ivan: Keflex 500 mg po QID thru 12/07/18. Patient confirmed he picked up the medication.  Follow up with ID: [email or web address].

## 2018-12-04 ENCOUNTER — Encounter: Admit: 2018-12-04 | Discharge: 2018-12-04 | Payer: MEDICARE

## 2018-12-05 ENCOUNTER — Encounter: Admit: 2018-12-05 | Discharge: 2018-12-05 | Payer: MEDICARE

## 2018-12-05 ENCOUNTER — Ambulatory Visit: Admit: 2018-12-05 | Discharge: 2018-12-05 | Payer: MEDICARE

## 2018-12-05 DIAGNOSIS — T85618A Breakdown (mechanical) of other specified internal prosthetic devices, implants and grafts, initial encounter: Secondary | ICD-10-CM

## 2018-12-05 DIAGNOSIS — N319 Neuromuscular dysfunction of bladder, unspecified: Secondary | ICD-10-CM

## 2018-12-05 DIAGNOSIS — R339 Retention of urine, unspecified: Secondary | ICD-10-CM

## 2018-12-05 DIAGNOSIS — Q059 Spina bifida, unspecified: Secondary | ICD-10-CM

## 2018-12-05 DIAGNOSIS — F988 Other specified behavioral and emotional disorders with onset usually occurring in childhood and adolescence: Secondary | ICD-10-CM

## 2018-12-05 DIAGNOSIS — I10 Essential (primary) hypertension: Secondary | ICD-10-CM

## 2018-12-05 DIAGNOSIS — G4733 Obstructive sleep apnea (adult) (pediatric): Secondary | ICD-10-CM

## 2018-12-05 DIAGNOSIS — Q0700 Arnold-Chiari syndrome without spina bifida or hydrocephalus: Secondary | ICD-10-CM

## 2018-12-05 DIAGNOSIS — N200 Calculus of kidney: Secondary | ICD-10-CM

## 2018-12-05 DIAGNOSIS — Z01810 Encounter for preprocedural cardiovascular examination: Secondary | ICD-10-CM

## 2018-12-05 DIAGNOSIS — R51 Headache: Secondary | ICD-10-CM

## 2018-12-05 DIAGNOSIS — R569 Unspecified convulsions: Secondary | ICD-10-CM

## 2018-12-05 DIAGNOSIS — G919 Hydrocephalus, unspecified: Secondary | ICD-10-CM

## 2018-12-05 DIAGNOSIS — N390 Urinary tract infection, site not specified: Secondary | ICD-10-CM

## 2018-12-05 DIAGNOSIS — R000 Tachycardia, unspecified: Secondary | ICD-10-CM

## 2018-12-05 DIAGNOSIS — I429 Cardiomyopathy, unspecified: Secondary | ICD-10-CM

## 2018-12-05 NOTE — Progress Notes
Clinical Nutrition Telephone Education Summary    Allen Gill is a 28 y.o. male with PMH of Spina bifida, Hydrocephalus/chiari malformation s/p VP shunt, neurogenic bladder s/p cystectomy/ileal conduit, recurrent UTIs/Pyleonephritis, chronic combined CHF s/p EF recovery.    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 Breakfast- poptarts or cereal with milk or water  ? Lunch- meat (fish, chicken, pork, steak), vegetables, potatoes  ? Dinner- meat (fish, chicken, pork, steak), vegetables, potatoes  ? Snacks- between meals meals and before bed; string cheese, pretzel crisps, apples or other fruit  ? Beverages- used to do 3 caffeine/sugar-free sodas per day and has cut back to 1, milk, water    Talked with pt and his caregiver, Allen Gill through [facility] [employer]. His caregiver is teaching him how to cook, especially lower-sodium foods. They are currently using salt, pepper, Gates seasoning and butter to flavor foods so we discussed other options. Pt is hoping to lose weight and learn how to follow a low sodium diet. Of note, he has been working with Allen Gill since 11/1, previously relied on convenience foods.    Intervention/Plan:  Education on a Heart Healthy Diet was provided.  Topics included the following:   ? Indications for diet  ? Serum lipid levels?recent results and goals  ? Saturated fat, trans fat and cholesterol (daily limits)  ? Healthy fats and oils  ? Foods recommended/not recommended  ? Reading food labels  ? Healthy carbohydrates   ? Daily fiber goal  ? Omega 3 fatty acids  ? Tips for cooking, grocery shopping , and eating out ? How to search for meal and snack options on the Internet, example websites provided  ? Sodium limit of 2,000 milligrams daily  ? Sodium content of foods (examples)  ? Sodium-free seasonings    Written materials were provided: Nutrition for a Healthy Heart, meal planning resources, snacking guide    Nutrition Monitoring and Evaluation:  Goal: focus on adding vegetables to all meals, use salt-free seasoning blends, read labels  Time Frame: ongoing    The patient was allowed to ask questions and actively participated in creating plan of care. I provided the patient with my contact information and instructed pt on how to get in touch with me if questions or concerns arise. Thank you for allowing nutrition services to participate in this patients care.     Follow up Date: PRN    Waldon Merl, MS, RD, LD   Office Phone [phone]  Available on Voalte Me

## 2018-12-05 NOTE — Telephone Encounter
Call placed to patient to check him in for his 1300 telehealth appointment today with Dr. Mont Dutton.   Unable to reach patient at this time.   Will attempt to reach again in a few minutes.

## 2018-12-06 ENCOUNTER — Encounter: Admit: 2018-12-06 | Discharge: 2018-12-06 | Payer: MEDICARE

## 2018-12-07 ENCOUNTER — Encounter: Admit: 2018-12-07 | Discharge: 2018-12-07 | Payer: MEDICARE

## 2018-12-13 ENCOUNTER — Encounter: Admit: 2018-12-13 | Discharge: 2018-12-13 | Payer: MEDICARE

## 2018-12-13 ENCOUNTER — Ambulatory Visit: Admit: 2018-12-13 | Discharge: 2018-12-14 | Payer: MEDICARE

## 2018-12-13 DIAGNOSIS — N390 Urinary tract infection, site not specified: Secondary | ICD-10-CM

## 2018-12-13 DIAGNOSIS — Z01810 Encounter for preprocedural cardiovascular examination: Secondary | ICD-10-CM

## 2018-12-13 DIAGNOSIS — G4733 Obstructive sleep apnea (adult) (pediatric): Secondary | ICD-10-CM

## 2018-12-13 DIAGNOSIS — N200 Calculus of kidney: Secondary | ICD-10-CM

## 2018-12-13 DIAGNOSIS — R51 Headache: Secondary | ICD-10-CM

## 2018-12-13 DIAGNOSIS — R339 Retention of urine, unspecified: Secondary | ICD-10-CM

## 2018-12-13 DIAGNOSIS — N319 Neuromuscular dysfunction of bladder, unspecified: Secondary | ICD-10-CM

## 2018-12-13 DIAGNOSIS — K56609 Unspecified intestinal obstruction, unspecified as to partial versus complete obstruction: Secondary | ICD-10-CM

## 2018-12-13 DIAGNOSIS — F988 Other specified behavioral and emotional disorders with onset usually occurring in childhood and adolescence: Secondary | ICD-10-CM

## 2018-12-13 DIAGNOSIS — T85618A Breakdown (mechanical) of other specified internal prosthetic devices, implants and grafts, initial encounter: Secondary | ICD-10-CM

## 2018-12-13 DIAGNOSIS — R569 Unspecified convulsions: Secondary | ICD-10-CM

## 2018-12-13 DIAGNOSIS — I10 Essential (primary) hypertension: Secondary | ICD-10-CM

## 2018-12-13 DIAGNOSIS — G919 Hydrocephalus, unspecified: Secondary | ICD-10-CM

## 2018-12-13 DIAGNOSIS — N10 Acute tubulo-interstitial nephritis: Secondary | ICD-10-CM

## 2018-12-13 DIAGNOSIS — R000 Tachycardia, unspecified: Secondary | ICD-10-CM

## 2018-12-13 DIAGNOSIS — I429 Cardiomyopathy, unspecified: Secondary | ICD-10-CM

## 2018-12-13 DIAGNOSIS — Z982 Presence of cerebrospinal fluid drainage device: Secondary | ICD-10-CM

## 2018-12-13 DIAGNOSIS — Z09 Encounter for follow-up examination after completed treatment for conditions other than malignant neoplasm: Secondary | ICD-10-CM

## 2018-12-13 DIAGNOSIS — Q0700 Arnold-Chiari syndrome without spina bifida or hydrocephalus: Secondary | ICD-10-CM

## 2018-12-13 DIAGNOSIS — Q050 Cervical spina bifida with hydrocephalus: Secondary | ICD-10-CM

## 2018-12-13 DIAGNOSIS — Q059 Spina bifida, unspecified: Secondary | ICD-10-CM

## 2018-12-13 MED ORDER — LEG BRACE MISC MISC
0 refills | Status: DC
Start: 2018-12-13 — End: 2019-07-23

## 2018-12-13 NOTE — Progress Notes
Telehealth Visit Note    Date of Service: 12/13/2018      Subjective:      Obtained patient's verbal consent to treat them and their agreement to [facility] financial policy and NPP via this telehealth visit during the [facility] Emergency       Allen Gill is a 28 y.o. male.    Chief Complaint   Patient presents with   ? Post-hospital Follow Up   ? Referral     Pt needs order for braces and shoes at [facility]   ? Letter for School/Work     Pt needs letter to the state for home health assistant and gym      Patient Reported Other  What medical problem brings you in to see the provider? Script for braces and shoe lift. I have also had a headache for a month.. This is a recurrent problem. The current episode started 1 to 4 weeks ago. The problem occurs daily. The problem has been unchanged. The pain score is: 5/10. The pain severity is mild.  Associated symptoms include a change in bowel habit and headaches. Pertinent negatives include no abdominal pain, anorexia, arthralgias, chest pain, chills, congestion, coughing, diaphoresis, fatigue, fever, joint swelling, myalgias, nausea, neck pain, numbness, rash, sore throat, swollen glands, urinary symptoms, vertigo, visual change, vomiting or weakness. The symptoms are aggravated by coughing and sneezing. He has tried acetaminophen, drinking, eating, lying down, position changes, relaxation, rest and sleep for the symptoms. The treatment provided no relief.     HPI Patient made a telehealth visit for post hospital follow-up after discharge.  He had a small bowel obstruction.  Patient did not require surgery and he states he is eating well now with no GI symptoms.  He also was recently seen in the ER for UTI and is completing his antibiotics for this.  Patient does continue to have a headache and is following closely via telephone with his neurosurgery team.  He is considering make an appointment for shunt evaluation if the headache does not improve.  Today patient request an order for the braces that he uses chronically and for shoe on his right foot for elevation.  Patient has spina bifida and is worn the braces for years.  He also needs an annual letter to state he needs home health assistant and to go to the gym.       Review of Systems   Constitutional: Negative for chills, diaphoresis, fatigue and fever.   HENT: Negative for congestion and sore throat.    Respiratory: Negative for cough.    Cardiovascular: Negative for chest pain.   Gastrointestinal: Positive for change in bowel habit. Negative for abdominal pain, anorexia, nausea and vomiting.   Musculoskeletal: Negative for arthralgias, joint swelling, myalgias and neck pain.   Skin: Negative for rash.   Neurological: Positive for headaches. Negative for vertigo, weakness and numbness.         Objective:         ? acetaminophen (TYLENOL) 325 mg tablet Take two tablets by mouth every 6 hours as needed.   ? carvediloL (COREG) 6.25 mg tablet Take one tablet by mouth twice daily with meals.   ? cholecalciferol(+) (VITAMIN D3) 2,000 unit tablet Take one tablet by mouth daily.   ? cyanocobalamin (VITAMIN B-12) 1,000 mcg tablet Take one tablet by mouth daily.   ? fish oil /omega-3 fatty acids (SEA-OMEGA) 340/1000 mg capsule Take 1 capsule by mouth daily.   ? Leg Brace  misc Bilateral Fitted leg brace and right shoe for foot drop.  Dx: spina bifida.   ? levETIRAcetam (KEPPRA) 500 mg tablet Take one tablet by mouth twice daily. ? losartan (COZAAR) 25 mg tablet Take one tablet by mouth daily.   ? multivitamin with iron (MULTIPLE VITAMINS WITH IRON PO) Take 1 tablet by mouth daily.   ? Doctor, general practice 1 piece Urostomy bag.  Use 1 bag as needed.   ? polyethylene glycol 3350 (MIRALAX) 17 g packet Take one packet by mouth twice daily.   ? senna (SENOKOT) 8.6 mg tablet Take one tablet by mouth twice daily.   ? spironolactone (ALDACTONE) 25 mg tablet Take one tablet by mouth daily. Take with food.     Telehealth Patient Reported Vitals     Row Name 12/13/18 1244                BP:  ? pt unable to obtain        Weight:  111.6 kg (246 lb)        Height:  162.6 cm (64)        Pain Score:  Zero            There is no height or weight on file to calculate BMI.     Physical Exam  Gen: NAD, alert and oriented  Pulm: nonlabored breathing, able to speak in complete sentences without difficulty  Psych: normal affect         Assessment and Plan:  Allen Gill was seen today for post-hospital follow up, referral and letter for school/work.    Diagnoses and all orders for this visit:    Hospital discharge follow-up    Spina bifida of cervical region with hydrocephalus (HCC)    Small bowel obstruction (HCC)    Acute pyelonephritis    S/P VP shunt    Other orders  -     Leg Brace misc; Bilateral Fitted leg brace and right shoe for foot drop.  Dx: spina bifida.    I reviewed patient's discharge summary and discharge medications.  His symptoms have resolved now.  Patient is also following closely with his neurosurgery team for his VP shunt.  Will fax orders to [facility] for patient's supply need with braces and shoe  Wrote a letter because patient does benefit from home health assistance given his chronic spina bifida and also going to the gym regularly which Gill improve his symptoms and mobility and decrease falls. 25 minutes spent on this patient's encounter with counseling and coordination of care taking >50% of the visit.

## 2018-12-15 ENCOUNTER — Encounter: Admit: 2018-12-15 | Discharge: 2018-12-15 | Payer: MEDICARE

## 2019-02-22 ENCOUNTER — Encounter: Admit: 2019-02-22 | Discharge: 2019-02-22 | Payer: MEDICARE

## 2019-02-27 ENCOUNTER — Encounter: Admit: 2019-02-27 | Discharge: 2019-02-27 | Payer: MEDICARE

## 2019-02-27 ENCOUNTER — Ambulatory Visit: Admit: 2019-02-27 | Discharge: 2019-02-27 | Payer: MEDICARE

## 2019-02-27 ENCOUNTER — Ambulatory Visit: Admit: 2019-02-27 | Discharge: 2019-02-28 | Payer: MEDICARE

## 2019-02-27 DIAGNOSIS — F988 Other specified behavioral and emotional disorders with onset usually occurring in childhood and adolescence: Secondary | ICD-10-CM

## 2019-02-27 DIAGNOSIS — T85618A Breakdown (mechanical) of other specified internal prosthetic devices, implants and grafts, initial encounter: Secondary | ICD-10-CM

## 2019-02-27 DIAGNOSIS — G919 Hydrocephalus, unspecified: Secondary | ICD-10-CM

## 2019-02-27 DIAGNOSIS — Q0700 Arnold-Chiari syndrome without spina bifida or hydrocephalus: Secondary | ICD-10-CM

## 2019-02-27 DIAGNOSIS — R569 Unspecified convulsions: Secondary | ICD-10-CM

## 2019-02-27 DIAGNOSIS — R000 Tachycardia, unspecified: Secondary | ICD-10-CM

## 2019-02-27 DIAGNOSIS — N390 Urinary tract infection, site not specified: Secondary | ICD-10-CM

## 2019-02-27 DIAGNOSIS — I10 Essential (primary) hypertension: Secondary | ICD-10-CM

## 2019-02-27 DIAGNOSIS — R339 Retention of urine, unspecified: Secondary | ICD-10-CM

## 2019-02-27 DIAGNOSIS — N319 Neuromuscular dysfunction of bladder, unspecified: Secondary | ICD-10-CM

## 2019-02-27 DIAGNOSIS — I429 Cardiomyopathy, unspecified: Secondary | ICD-10-CM

## 2019-02-27 DIAGNOSIS — Z01810 Encounter for preprocedural cardiovascular examination: Secondary | ICD-10-CM

## 2019-02-27 DIAGNOSIS — Q059 Spina bifida, unspecified: Secondary | ICD-10-CM

## 2019-02-27 DIAGNOSIS — N200 Calculus of kidney: Secondary | ICD-10-CM

## 2019-02-27 DIAGNOSIS — I428 Other cardiomyopathies: Secondary | ICD-10-CM

## 2019-02-27 DIAGNOSIS — R51 Headache: Secondary | ICD-10-CM

## 2019-02-27 DIAGNOSIS — G4733 Obstructive sleep apnea (adult) (pediatric): Secondary | ICD-10-CM

## 2019-02-27 MED ORDER — PERFLUTREN LIPID MICROSPHERES 1.1 MG/ML IV SUSP
1-20 mL | Freq: Once | INTRAVENOUS | 0 refills | Status: CP | PRN
Start: 2019-02-27 — End: ?
  Administered 2019-02-27: 21:00:00 4 mL via INTRAVENOUS

## 2019-03-01 ENCOUNTER — Encounter: Admit: 2019-03-01 | Discharge: 2019-03-01 | Payer: MEDICARE

## 2019-03-01 MED ORDER — LEVETIRACETAM 500 MG PO TAB
500 mg | ORAL_TABLET | Freq: Two times a day (BID) | ORAL | 3 refills | 90.00000 days | Status: DC
Start: 2019-03-01 — End: 2019-06-27

## 2019-03-02 ENCOUNTER — Encounter: Admit: 2019-03-02 | Discharge: 2019-03-02 | Payer: MEDICARE

## 2019-03-02 NOTE — Progress Notes
Left message for pt regarding CCM program. Will attempt call again in future.

## 2019-04-04 ENCOUNTER — Encounter: Admit: 2019-04-04 | Discharge: 2019-04-04 | Payer: MEDICARE

## 2019-04-04 ENCOUNTER — Ambulatory Visit: Admit: 2019-04-04 | Discharge: 2019-04-04 | Payer: MEDICARE

## 2019-04-04 DIAGNOSIS — N200 Calculus of kidney: Secondary | ICD-10-CM

## 2019-04-04 DIAGNOSIS — Q059 Spina bifida, unspecified: Secondary | ICD-10-CM

## 2019-04-04 DIAGNOSIS — Q0700 Arnold-Chiari syndrome without spina bifida or hydrocephalus: Secondary | ICD-10-CM

## 2019-04-04 DIAGNOSIS — N390 Urinary tract infection, site not specified: Secondary | ICD-10-CM

## 2019-04-04 DIAGNOSIS — T85618A Breakdown (mechanical) of other specified internal prosthetic devices, implants and grafts, initial encounter: Secondary | ICD-10-CM

## 2019-04-04 DIAGNOSIS — R339 Retention of urine, unspecified: Secondary | ICD-10-CM

## 2019-04-04 DIAGNOSIS — Z01810 Encounter for preprocedural cardiovascular examination: Secondary | ICD-10-CM

## 2019-04-04 DIAGNOSIS — F988 Other specified behavioral and emotional disorders with onset usually occurring in childhood and adolescence: Secondary | ICD-10-CM

## 2019-04-04 DIAGNOSIS — R569 Unspecified convulsions: Secondary | ICD-10-CM

## 2019-04-04 DIAGNOSIS — I429 Cardiomyopathy, unspecified: Secondary | ICD-10-CM

## 2019-04-04 DIAGNOSIS — G919 Hydrocephalus, unspecified: Secondary | ICD-10-CM

## 2019-04-04 DIAGNOSIS — G4733 Obstructive sleep apnea (adult) (pediatric): Secondary | ICD-10-CM

## 2019-04-04 DIAGNOSIS — R000 Tachycardia, unspecified: Secondary | ICD-10-CM

## 2019-04-04 DIAGNOSIS — N319 Neuromuscular dysfunction of bladder, unspecified: Secondary | ICD-10-CM

## 2019-04-04 DIAGNOSIS — R51 Headache: Secondary | ICD-10-CM

## 2019-04-04 DIAGNOSIS — I10 Essential (primary) hypertension: Secondary | ICD-10-CM

## 2019-05-01 ENCOUNTER — Emergency Department

## 2019-05-01 MED ORDER — SODIUM CHLORIDE 0.9 % IJ SOLN
50 mL | Freq: Once | INTRAVENOUS | 0 refills | Status: CP
Start: 2019-05-01 — End: ?
  Administered 2019-05-02: 04:00:00 50 mL via INTRAVENOUS

## 2019-05-01 MED ORDER — CEFEPIME 2G/100ML NS IVPB (MB+)
2 g | Freq: Once | INTRAVENOUS | 0 refills | Status: CP
Start: 2019-05-01 — End: ?
  Administered 2019-05-02 (×2): 2 g via INTRAVENOUS

## 2019-05-01 MED ORDER — IOHEXOL 350 MG IODINE/ML IV SOLN
80 mL | Freq: Once | INTRAVENOUS | 0 refills | Status: CP
Start: 2019-05-01 — End: ?
  Administered 2019-05-02: 04:00:00 80 mL via INTRAVENOUS

## 2019-05-01 MED ORDER — ACETAMINOPHEN 500 MG PO TAB
1000 mg | Freq: Once | ORAL | 0 refills | Status: CP
Start: 2019-05-01 — End: ?
  Administered 2019-05-02: 04:00:00 1000 mg via ORAL

## 2019-05-01 MED ORDER — VANCOMYCIN 2,250 MG IVPB
20 mg/kg | Freq: Once | INTRAVENOUS | 0 refills | Status: CP
Start: 2019-05-01 — End: ?
  Administered 2019-05-02 (×2): 2250 mg via INTRAVENOUS

## 2019-05-01 MED ORDER — LACTATED RINGERS IV SOLP
30 mL/kg | Freq: Once | INTRAVENOUS | 0 refills | Status: CP
Start: 2019-05-01 — End: ?
  Administered 2019-05-02: 04:00:00 1776 mL via INTRAVENOUS

## 2019-05-02 ENCOUNTER — Emergency Department

## 2019-05-02 MED ORDER — MAGNESIUM SULFATE IN D5W 1 GRAM/100 ML IV PGBK
1 g | INTRAVENOUS | 0 refills | Status: CP
Start: 2019-05-02 — End: ?
  Administered 2019-05-03: 03:00:00 1 g via INTRAVENOUS

## 2019-05-02 MED ORDER — EPINEPHRINE 0.1MG NS 10ML SYR (AM)(OR)
0 refills | Status: DC
Start: 2019-05-02 — End: 2019-05-02
  Administered 2019-05-02 (×2): 10 ug via INTRAVENOUS

## 2019-05-02 MED ORDER — POTASSIUM CHLORIDE IN WATER 10 MEQ/50 ML IV PGBK
10 meq | INTRAVENOUS | 0 refills | Status: CP
Start: 2019-05-02 — End: ?
  Administered 2019-05-03: 02:00:00 10 meq via INTRAVENOUS

## 2019-05-02 MED ORDER — MEPERIDINE (PF) 25 MG/ML IJ SYRG
12.5 mg | INTRAVENOUS | 0 refills | Status: DC | PRN
Start: 2019-05-02 — End: 2019-05-02

## 2019-05-02 MED ORDER — VANCOMYCIN 1,750 MG IVPB
15 mg/kg | Freq: Two times a day (BID) | INTRAVENOUS | 0 refills | Status: DC
Start: 2019-05-02 — End: 2019-05-02

## 2019-05-02 MED ORDER — ACETAMINOPHEN 325 MG PO TAB
650 mg | ORAL | 0 refills | Status: DC | PRN
Start: 2019-05-02 — End: 2019-05-08
  Administered 2019-05-03 – 2019-05-08 (×5): 650 mg via ORAL

## 2019-05-02 MED ORDER — POTASSIUM CHLORIDE IN WATER 10 MEQ/50 ML IV PGBK
10 meq | INTRAVENOUS | 0 refills | Status: CP
Start: 2019-05-02 — End: ?
  Administered 2019-05-03: 03:00:00 10 meq via INTRAVENOUS

## 2019-05-02 MED ORDER — ALBUMIN, HUMAN 5 % 500 ML IV SOLP (AN)(OSM)
0 refills | Status: DC
Start: 2019-05-02 — End: 2019-05-02
  Administered 2019-05-02 (×2): via INTRAVENOUS

## 2019-05-02 MED ORDER — NOREPINEPHRINE IV DRIP STD CONC (AM)(OR)
0 refills | Status: DC
Start: 2019-05-02 — End: 2019-05-02
  Administered 2019-05-02: 22:00:00 0.05 ug/kg/min via INTRAVENOUS

## 2019-05-02 MED ORDER — LIDOCAINE (PF) 200 MG/10 ML (2 %) IJ SYRG
0 refills | Status: DC
Start: 2019-05-02 — End: 2019-05-02
  Administered 2019-05-02: 21:00:00 100 mg via INTRAVENOUS

## 2019-05-02 MED ORDER — DOXYCYCLINE 100ML IVPB
100 mg | Freq: Two times a day (BID) | INTRAVENOUS | 0 refills | Status: DC
Start: 2019-05-02 — End: 2019-05-04
  Administered 2019-05-02 – 2019-05-04 (×8): 100 mg via INTRAVENOUS

## 2019-05-02 MED ORDER — ROCURONIUM 10 MG/ML IV SOLN
INTRAVENOUS | 0 refills | Status: DC
Start: 2019-05-02 — End: 2019-05-02
  Administered 2019-05-02: 21:00:00 100 mg via INTRAVENOUS

## 2019-05-02 MED ORDER — CEFEPIME 2G/100ML NS IVPB (MB+)
2 g | INTRAVENOUS | 0 refills | Status: DC
Start: 2019-05-02 — End: 2019-05-04
  Administered 2019-05-02 – 2019-05-04 (×14): 2 g via INTRAVENOUS

## 2019-05-02 MED ORDER — LACTATED RINGERS IV SOLP
500 mL | INTRAVENOUS | 0 refills | Status: CP
Start: 2019-05-02 — End: ?
  Administered 2019-05-03: 01:00:00 500 mL via INTRAVENOUS

## 2019-05-02 MED ORDER — POLYETHYLENE GLYCOL 3350 17 GRAM PO PWPK
1 | Freq: Every day | ORAL | 0 refills | Status: DC
Start: 2019-05-02 — End: 2019-05-04

## 2019-05-02 MED ORDER — FENTANYL CITRATE (PF) 50 MCG/ML IJ SOLN
25 ug | INTRAVENOUS | 0 refills | Status: DC | PRN
Start: 2019-05-02 — End: 2019-05-02

## 2019-05-02 MED ORDER — FENTANYL CITRATE (PF) 50 MCG/ML IJ SOLN
0 refills | Status: DC
Start: 2019-05-02 — End: 2019-05-02
  Administered 2019-05-02: 21:00:00 75 ug via INTRAVENOUS

## 2019-05-02 MED ORDER — SENNOSIDES-DOCUSATE SODIUM 8.6-50 MG PO TAB
1 | Freq: Two times a day (BID) | ORAL | 0 refills | Status: DC
Start: 2019-05-02 — End: 2019-05-09
  Administered 2019-05-04 – 2019-05-07 (×7): 1 via ORAL

## 2019-05-02 MED ORDER — VANCOMYCIN 1,750 MG IVPB
1750 mg | Freq: Once | INTRAVENOUS | 0 refills | Status: DC
Start: 2019-05-02 — End: 2019-05-02

## 2019-05-02 MED ORDER — PROMETHAZINE 25 MG/ML IJ SOLN
6.25 mg | INTRAVENOUS | 0 refills | Status: DC | PRN
Start: 2019-05-02 — End: 2019-05-02

## 2019-05-02 MED ORDER — FENTANYL CITRATE (PF) 50 MCG/ML IJ SOLN
12.5-25 ug | INTRAVENOUS | 0 refills | Status: DC | PRN
Start: 2019-05-02 — End: 2019-05-02

## 2019-05-02 MED ORDER — PHENYLEPHRINE 40 MCG/ML IN NS IV DRIP (STD CONC)
0 refills | Status: DC
Start: 2019-05-02 — End: 2019-05-02
  Administered 2019-05-02 (×2): 1 ug/kg/min via INTRAVENOUS

## 2019-05-02 MED ORDER — LEVETIRACETAM 500 MG PO TAB
500 mg | Freq: Two times a day (BID) | ORAL | 0 refills | Status: DC
Start: 2019-05-02 — End: 2019-05-09
  Administered 2019-05-02 – 2019-05-08 (×12): 500 mg via ORAL

## 2019-05-02 MED ORDER — ENOXAPARIN 40 MG/0.4 ML SC SYRG
40 mg | Freq: Every day | SUBCUTANEOUS | 0 refills | Status: DC
Start: 2019-05-02 — End: 2019-05-02

## 2019-05-02 MED ORDER — FENTANYL DRIP IN NS 1000MCG/100ML
10-70 ug/h | INTRAVENOUS | 0 refills | Status: DC
Start: 2019-05-02 — End: 2019-05-03

## 2019-05-02 MED ORDER — VANCOMYCIN PHARMACY TO MANAGE
1 | 0 refills | Status: DC
Start: 2019-05-02 — End: 2019-05-03

## 2019-05-02 MED ORDER — PHENYLEPHRINE HCL IN 0.9% NACL 1 MG/10 ML (100 MCG/ML) IV SYRG
INTRAVENOUS | 0 refills | Status: DC
Start: 2019-05-02 — End: 2019-05-02
  Administered 2019-05-02: 21:00:00 100 ug via INTRAVENOUS

## 2019-05-02 MED ORDER — HYDROMORPHONE (PF) 2 MG/ML IJ SYRG
.5 mg | INTRAVENOUS | 0 refills | Status: DC | PRN
Start: 2019-05-02 — End: 2019-05-02

## 2019-05-02 MED ORDER — ARTIFICIAL TEARS SINGLE DOSE DROPS GROUP
0 refills | Status: DC
Start: 2019-05-02 — End: 2019-05-02
  Administered 2019-05-02: 21:00:00 2 [drp] via OPHTHALMIC

## 2019-05-02 MED ORDER — ACETAMINOPHEN 325 MG PO TAB
650 mg | ORAL | 0 refills | Status: DC | PRN
Start: 2019-05-02 — End: 2019-05-02
  Administered 2019-05-02 (×2): 650 mg via ORAL

## 2019-05-02 MED ORDER — IOHEXOL 300 MG IODINE/ML IV SOLN
30 mL | Freq: Once | 0 refills | Status: CP
Start: 2019-05-02 — End: ?
  Administered 2019-05-02: 22:00:00 30 mL

## 2019-05-02 MED ORDER — PROPOFOL 10 MG/ML IV EMUL
5-50 ug/kg/min | INTRAVENOUS | 0 refills | Status: DC
Start: 2019-05-02 — End: 2019-05-03
  Administered 2019-05-03 (×2): 30 ug/kg/min via INTRAVENOUS

## 2019-05-02 MED ORDER — PROPOFOL INJ 10 MG/ML IV VIAL
0 refills | Status: DC
Start: 2019-05-02 — End: 2019-05-02
  Administered 2019-05-02: 21:00:00 150 mg via INTRAVENOUS

## 2019-05-02 MED ORDER — VASOPRESSIN 20 UNIT/ML IV SOLN
0 refills | Status: DC
Start: 2019-05-02 — End: 2019-05-02
  Administered 2019-05-02 (×2): 1 [IU] via INTRAVENOUS

## 2019-05-02 MED ORDER — HALOPERIDOL LACTATE 5 MG/ML IJ SOLN
1 mg | Freq: Once | INTRAVENOUS | 0 refills | Status: DC | PRN
Start: 2019-05-02 — End: 2019-05-02

## 2019-05-02 MED ORDER — VANCOMYCIN 1,500 MG IVPB
1500 mg | Freq: Two times a day (BID) | INTRAVENOUS | 0 refills | Status: DC
Start: 2019-05-02 — End: 2019-05-03
  Administered 2019-05-02 (×2): 1500 mg via INTRAVENOUS

## 2019-05-02 MED ORDER — ENOXAPARIN 40 MG/0.4 ML SC SYRG
40 mg | Freq: Two times a day (BID) | SUBCUTANEOUS | 0 refills | Status: DC
Start: 2019-05-02 — End: 2019-05-09
  Administered 2019-05-02 – 2019-05-08 (×13): 40 mg via SUBCUTANEOUS

## 2019-05-02 MED ORDER — OXYCODONE 5 MG PO TAB
5 mg | ORAL | 0 refills | Status: DC | PRN
Start: 2019-05-02 — End: 2019-05-02
  Administered 2019-05-02: 18:00:00 5 mg via ORAL

## 2019-05-02 MED ORDER — SODIUM CHLORIDE 0.9 % IJ SYRG
10 mL | Freq: Two times a day (BID) | INTRAVENOUS | 0 refills | Status: DC
Start: 2019-05-02 — End: 2019-05-05
  Administered 2019-05-03 – 2019-05-05 (×5): 10 mL via INTRAVENOUS

## 2019-05-02 MED ORDER — SODIUM CHLORIDE 0.9 % IV SOLP
1000 mL | INTRAVENOUS | 0 refills | Status: DC
Start: 2019-05-02 — End: 2019-05-03
  Administered 2019-05-02: 20:00:00 1000 mL via INTRAVENOUS

## 2019-05-02 MED ORDER — NOREPINEPHRINE BITARTRATE-NACL 4 MG/250 ML (16 MCG/ML) IV SOLN
0-.5 ug/kg/min | INTRAVENOUS | 0 refills | Status: DC
Start: 2019-05-02 — End: 2019-05-03

## 2019-05-02 MED ORDER — MIDAZOLAM 1 MG/ML IJ SOLN
INTRAVENOUS | 0 refills | Status: DC
Start: 2019-05-02 — End: 2019-05-02
  Administered 2019-05-02: 21:00:00 2 mg via INTRAVENOUS

## 2019-05-02 MED ORDER — LACTATED RINGERS IV SOLP
250 mL | INTRAVENOUS | 0 refills | Status: CP
Start: 2019-05-02 — End: ?
  Administered 2019-05-03: 01:00:00 250 mL via INTRAVENOUS

## 2019-05-02 MED ORDER — MAGNESIUM SULFATE IN D5W 1 GRAM/100 ML IV PGBK
1 g | INTRAVENOUS | 0 refills | Status: CP
Start: 2019-05-02 — End: ?
  Administered 2019-05-03: 08:00:00 1 g via INTRAVENOUS

## 2019-05-02 MED ORDER — PANTOPRAZOLE 40 MG IV SOLR
40 mg | Freq: Every day | INTRAVENOUS | 0 refills | Status: DC
Start: 2019-05-02 — End: 2019-05-03
  Administered 2019-05-03 (×2): 40 mg via INTRAVENOUS

## 2019-05-02 MED ORDER — LACTATED RINGERS IV SOLP
INTRAVENOUS | 0 refills | Status: AC
Start: 2019-05-02 — End: ?
  Administered 2019-05-02 (×2): 1000.000 mL via INTRAVENOUS

## 2019-05-02 MED ORDER — LACTATED RINGERS IV SOLP
0 refills | Status: DC
Start: 2019-05-02 — End: 2019-05-02
  Administered 2019-05-02: 21:00:00 via INTRAVENOUS

## 2019-05-02 MED ORDER — CHLORHEXIDINE GLUCONATE 0.12 % MM MWSH
15 mL | Freq: Two times a day (BID) | ORAL | 0 refills | Status: DC
Start: 2019-05-02 — End: 2019-05-03
  Administered 2019-05-03 (×2): 15 mL via ORAL

## 2019-05-02 NOTE — ED Notes
29 year old male presents to ED27 c/o fever, dyspnea, cough, mucus production, fever of 100.12F on arrival. Patient reports this began at about 10pm last night. PMH includes spina bifida, arnold-chiari malformation, hydrocephalus, seizure disorder, HTN, s/p ileal conduit, heart failure. Pt denies CP, N/V/D, constipation. Pt is A&Ox4. Pt resting in bed, locked in lowest position, side rails up, call light within reach. Pt hooked up to BP/O2/tele.     Belongings: Phone, phone charger, shoes, shorts, shirt, leg braces

## 2019-05-03 MED ORDER — POTASSIUM PHOSPHATE IVPB-HIGH
20 MMOL | Freq: Once | INTRAVENOUS | 0 refills | Status: CP
Start: 2019-05-03 — End: ?
  Administered 2019-05-03 (×2): 20 mmol via INTRAVENOUS

## 2019-05-03 MED ORDER — POTASSIUM CHLORIDE IN WATER 10 MEQ/50 ML IV PGBK
10 meq | INTRAVENOUS | 0 refills | Status: CP
Start: 2019-05-03 — End: ?
  Administered 2019-05-03: 14:00:00 10 meq via INTRAVENOUS

## 2019-05-03 MED ORDER — POTASSIUM CHLORIDE IN WATER 10 MEQ/50 ML IV PGBK
10 meq | INTRAVENOUS | 0 refills | Status: CP
Start: 2019-05-03 — End: ?
  Administered 2019-05-03: 12:00:00 10 meq via INTRAVENOUS

## 2019-05-03 MED ORDER — SODIUM PHOSPHATE IVPB
16 MMOL | Freq: Once | INTRAVENOUS | 0 refills | Status: CP
Start: 2019-05-03 — End: ?
  Administered 2019-05-03 (×2): 16 mmol via INTRAVENOUS

## 2019-05-03 MED ORDER — POTASSIUM CHLORIDE IN WATER 10 MEQ/50 ML IV PGBK
10 meq | INTRAVENOUS | 0 refills | Status: CP
Start: 2019-05-03 — End: ?
  Administered 2019-05-03: 13:00:00 10 meq via INTRAVENOUS

## 2019-05-03 MED ORDER — ALBUTEROL SULFATE 2.5 MG/0.5 ML IN NEBU
2.5 mg | RESPIRATORY_TRACT | 0 refills | Status: DC | PRN
Start: 2019-05-03 — End: 2019-05-04
  Administered 2019-05-03 – 2019-05-04 (×5): 2.5 mg via RESPIRATORY_TRACT

## 2019-05-03 MED ORDER — IPRATROPIUM BROMIDE 0.02 % IN SOLN
.5 mg | RESPIRATORY_TRACT | 0 refills | Status: DC | PRN
Start: 2019-05-03 — End: 2019-05-04
  Administered 2019-05-03 – 2019-05-04 (×6): 0.5 mg via RESPIRATORY_TRACT

## 2019-05-03 MED ORDER — POTASSIUM CHLORIDE IN WATER 10 MEQ/50 ML IV PGBK
10 meq | INTRAVENOUS | 0 refills | Status: CP
Start: 2019-05-03 — End: ?
  Administered 2019-05-03: 15:00:00 10 meq via INTRAVENOUS

## 2019-05-03 MED ADMIN — ALBUTEROL SULFATE 2.5 MG/0.5 ML IN NEBU [93139]: 2.5 mg | RESPIRATORY_TRACT | @ 16:00:00 | Stop: 2019-05-03 | NDC 00487990130

## 2019-05-03 MED ADMIN — FENTANYL DRIP IN NS 1000MCG/100ML [30807]: 25 ug/h | INTRAVENOUS | Stop: 2019-05-03 | NDC 70004020232

## 2019-05-03 MED ADMIN — PROPOFOL 10 MG/ML IV EMUL [11150]: 20 ug/kg/min | INTRAVENOUS | Stop: 2019-05-03 | NDC 00409469954

## 2019-05-04 MED ORDER — BARIUM SULFATE 81 % (W/W) PO POWD
10 mL | Freq: Once | ORAL | 0 refills | Status: CP
Start: 2019-05-04 — End: ?
  Administered 2019-05-04: 16:00:00 25 mL via ORAL

## 2019-05-04 MED ORDER — IPRATROPIUM BROMIDE 0.02 % IN SOLN
.5 mg | RESPIRATORY_TRACT | 0 refills | Status: DC | PRN
Start: 2019-05-04 — End: 2019-05-08

## 2019-05-04 MED ORDER — BARIUM SULFATE 40 % (W/V) 29% (W/W) PO SUSP
10 mL | Freq: Once | ORAL | 0 refills | Status: AC
Start: 2019-05-04 — End: ?

## 2019-05-04 MED ORDER — AMPICILLIN 2G/100ML NS IVPB (MB+)
2 g | INTRAVENOUS | 0 refills | Status: DC
Start: 2019-05-04 — End: 2019-05-08
  Administered 2019-05-04 – 2019-05-08 (×30): 2 g via INTRAVENOUS

## 2019-05-04 MED ORDER — CARVEDILOL 3.125 MG PO TAB
3.125 mg | Freq: Two times a day (BID) | ORAL | 0 refills | Status: DC
Start: 2019-05-04 — End: 2019-05-06
  Administered 2019-05-04 – 2019-05-06 (×4): 3.125 mg via ORAL

## 2019-05-04 MED ORDER — ALBUTEROL SULFATE 2.5 MG/0.5 ML IN NEBU
2.5 mg | RESPIRATORY_TRACT | 0 refills | Status: DC | PRN
Start: 2019-05-04 — End: 2019-05-08

## 2019-05-04 MED ORDER — FAMOTIDINE 20 MG PO TAB
20 mg | Freq: Two times a day (BID) | ORAL | 0 refills | Status: DC
Start: 2019-05-04 — End: 2019-05-09
  Administered 2019-05-04 – 2019-05-08 (×9): 20 mg via ORAL

## 2019-05-04 MED ORDER — SODIUM PHOSPHATE IVPB
24 MMOL | Freq: Once | INTRAVENOUS | 0 refills | Status: CP
Start: 2019-05-04 — End: ?
  Administered 2019-05-04 (×2): 24 mmol via INTRAVENOUS

## 2019-05-04 MED ORDER — POTASSIUM CHLORIDE 20 MEQ PO TBTQ
40 meq | Freq: Once | ORAL | 0 refills | Status: CP
Start: 2019-05-04 — End: ?
  Administered 2019-05-04: 11:00:00 40 meq via ORAL

## 2019-05-04 MED ORDER — BARIUM SULFATE 40 % (W/V), 30% (W/W) PO PSTE
10 mL | Freq: Once | ORAL | 0 refills | Status: CP
Start: 2019-05-04 — End: ?
  Administered 2019-05-04: 16:00:00 10 mL via ORAL

## 2019-05-04 MED ORDER — POLYETHYLENE GLYCOL 3350 17 GRAM PO PWPK
1 | Freq: Two times a day (BID) | ORAL | 0 refills | Status: DC
Start: 2019-05-04 — End: 2019-05-09

## 2019-05-04 MED ORDER — METFORMIN 500 MG PO TAB
500 mg | Freq: Two times a day (BID) | ORAL | 0 refills | Status: DC
Start: 2019-05-04 — End: 2019-05-09
  Administered 2019-05-04 – 2019-05-08 (×10): 500 mg via ORAL

## 2019-05-04 MED ORDER — BARIUM SULFATE 40 % (W/V) PO SUSP
10 mL | Freq: Once | ORAL | 0 refills | Status: CP
Start: 2019-05-04 — End: ?
  Administered 2019-05-04: 16:00:00 15 mL via ORAL

## 2019-05-05 MED ORDER — SODIUM CHLORIDE 0.9 % IJ SYRG
10 mL | INTRAVENOUS | 0 refills | Status: DC
Start: 2019-05-05 — End: 2019-05-05

## 2019-05-05 MED ORDER — SODIUM CHLORIDE 0.9 % FLUSH
5-10 mL | Freq: Three times a day (TID) | 0 refills | Status: DC
Start: 2019-05-05 — End: 2019-05-09

## 2019-05-05 MED ORDER — SODIUM CHLORIDE 0.9 % IJ SYRG
10 mL | 0 refills | Status: DC
Start: 2019-05-05 — End: 2019-05-09
  Administered 2019-05-05 – 2019-05-08 (×11): 10 mL

## 2019-05-06 MED ORDER — FERROUS SULFATE 325 MG (65 MG IRON) PO TAB
325 mg | Freq: Three times a day (TID) | ORAL | 0 refills | Status: DC
Start: 2019-05-06 — End: 2019-05-08
  Administered 2019-05-06 – 2019-05-08 (×6): 325 mg via ORAL

## 2019-05-06 MED ORDER — CARVEDILOL 6.25 MG PO TAB
6.25 mg | Freq: Two times a day (BID) | ORAL | 0 refills | Status: DC
Start: 2019-05-06 — End: 2019-05-09
  Administered 2019-05-06 – 2019-05-08 (×5): 6.25 mg via ORAL

## 2019-05-06 MED ORDER — SPIRONOLACTONE 25 MG PO TAB
25 mg | Freq: Every day | ORAL | 0 refills | Status: DC
Start: 2019-05-06 — End: 2019-05-09
  Administered 2019-05-06 – 2019-05-08 (×3): 25 mg via ORAL

## 2019-05-07 MED ORDER — LOSARTAN 25 MG PO TAB
25 mg | Freq: Every day | ORAL | 0 refills | Status: DC
Start: 2019-05-07 — End: 2019-05-09
  Administered 2019-05-07 – 2019-05-08 (×2): 25 mg via ORAL

## 2019-05-07 MED ORDER — LEVOFLOXACIN 750 MG PO TAB
750 mg | ORAL | 0 refills | Status: CN
Start: 2019-05-07 — End: ?

## 2019-05-07 MED ORDER — MAGNESIUM SULFATE IN D5W 1 GRAM/100 ML IV PGBK
1 g | INTRAVENOUS | 0 refills | Status: CP
Start: 2019-05-07 — End: ?
  Administered 2019-05-07: 13:00:00 1 g via INTRAVENOUS

## 2019-05-08 MED ORDER — ACETAMINOPHEN 325 MG PO TAB
650 mg | ORAL | 0 refills | Status: DC | PRN
Start: 2019-05-08 — End: 2019-05-09

## 2019-05-08 MED ORDER — AMOXICILLIN-POT CLAVULANATE 875-125 MG PO TAB
1 | ORAL_TABLET | Freq: Two times a day (BID) | ORAL | 0 refills | 7.00000 days | Status: AC
Start: 2019-05-08 — End: ?

## 2019-05-08 MED ORDER — AMOXICILLIN-POT CLAVULANATE 875-125 MG PO TAB
875 mg | Freq: Two times a day (BID) | ORAL | 0 refills | Status: DC
Start: 2019-05-08 — End: 2019-05-09
  Administered 2019-05-08 (×2): 875 mg via ORAL

## 2019-05-08 MED ORDER — FERROUS GLUCONATE 324 MG (37.5 MG IRON) PO TAB
324 mg | Freq: Every day | ORAL | 0 refills | Status: DC
Start: 2019-05-08 — End: 2019-05-09

## 2019-05-08 MED ORDER — METFORMIN 500 MG PO TAB
500 mg | ORAL_TABLET | Freq: Two times a day (BID) | ORAL | 0 refills | Status: DC
Start: 2019-05-08 — End: 2019-05-31

## 2019-05-08 MED ORDER — AMOXICILLIN-POT CLAVULANATE 875-125 MG PO TAB
ORAL_TABLET | ORAL | 0 refills | 7.00000 days | Status: DC
Start: 2019-05-08 — End: 2019-05-31

## 2019-05-08 MED ORDER — IPRATROPIUM-ALBUTEROL 0.5 MG-3 MG(2.5 MG BASE)/3 ML IN NEBU
3 mL | RESPIRATORY_TRACT | 0 refills | Status: DC | PRN
Start: 2019-05-08 — End: 2019-05-09

## 2019-05-08 MED ORDER — FERROUS GLUCONATE 324 MG (37.5 MG IRON) PO TAB
324 mg | Freq: Every day | ORAL | 0 refills | 90.00000 days | Status: DC
Start: 2019-05-08 — End: 2019-05-31

## 2019-05-08 MED ORDER — FERROUS SULFATE 325 MG (65 MG IRON) PO TAB
325 mg | ORAL_TABLET | Freq: Three times a day (TID) | ORAL | 0 refills | Status: CN
Start: 2019-05-08 — End: ?

## 2019-05-14 ENCOUNTER — Encounter: Admit: 2019-05-14 | Discharge: 2019-05-14 | Payer: MEDICARE

## 2019-05-14 NOTE — Telephone Encounter
[facility] [place] nurse Shanda Bumps, wondering when neph tube is coming out. Will need an appt. CB [phone]    Called Jessica back. She doesn't know exactly when neph tube was placed but less than 3 months ago. Urine output is good per Shanda Bumps.     I told her I'll send a message to Dr Luther Redo if okay to remove and to help get schedule.

## 2019-05-15 ENCOUNTER — Encounter: Admit: 2019-05-15 | Discharge: 2019-05-15 | Payer: MEDICARE

## 2019-05-15 NOTE — Telephone Encounter
Shanda Bumps from the [facility] [place] called [phone].  She wanted to know when the pt's neph tube will come out.  Attempted to call and left a message that per Dr Luther Redo the neph tube will stay in until surgery at the end of May and also to make sure that the pt takes the antibiotics that were prescribed at discharge.

## 2019-05-15 NOTE — Telephone Encounter
Pt calling asking when nephrostomy tube is coming out. I reiterated what Becky RN had charted for his nurse. He voices understanding.

## 2019-05-24 ENCOUNTER — Encounter: Admit: 2019-05-24 | Discharge: 2019-05-24 | Payer: MEDICARE

## 2019-05-24 NOTE — Telephone Encounter
Pt calling to see if he is supposed to be on an antibiotic now and up until his surgery. He is supposed to be taking Augmentin 10 days prior to surgery. From what it sounds like he took this Rx already. I told him, per Dr Micah Noel, he can have another Rx that he needs to start on the 17th of May and take up until the surgery. He agrees with this plan.   When I try to put in the ABx it tells me he has an allergy but he has been on this previously. I will send to [facility] for her review.

## 2019-05-25 ENCOUNTER — Encounter: Admit: 2019-05-25 | Discharge: 2019-05-25 | Payer: MEDICARE

## 2019-05-25 MED ORDER — LEVOFLOXACIN 250 MG PO TAB
250 mg | ORAL_TABLET | Freq: Every day | ORAL | 0 refills | 7.00000 days | Status: DC
Start: 2019-05-25 — End: 2019-06-12
  Filled 2019-05-26: qty 15, 15d supply, fill #1

## 2019-05-26 ENCOUNTER — Encounter: Admit: 2019-05-26 | Discharge: 2019-05-26 | Payer: MEDICARE

## 2019-05-27 ENCOUNTER — Encounter: Admit: 2019-05-27 | Discharge: 2019-05-27 | Payer: MEDICARE

## 2019-05-31 ENCOUNTER — Encounter: Admit: 2019-05-31 | Discharge: 2019-05-31 | Payer: MEDICARE

## 2019-05-31 ENCOUNTER — Ambulatory Visit: Admit: 2019-05-31 | Discharge: 2019-05-31 | Payer: MEDICARE

## 2019-05-31 DIAGNOSIS — R51 Headache: Secondary | ICD-10-CM

## 2019-05-31 DIAGNOSIS — T85618A Breakdown (mechanical) of other specified internal prosthetic devices, implants and grafts, initial encounter: Secondary | ICD-10-CM

## 2019-05-31 DIAGNOSIS — N200 Calculus of kidney: Secondary | ICD-10-CM

## 2019-05-31 DIAGNOSIS — R569 Unspecified convulsions: Secondary | ICD-10-CM

## 2019-05-31 DIAGNOSIS — Z01810 Encounter for preprocedural cardiovascular examination: Secondary | ICD-10-CM

## 2019-05-31 DIAGNOSIS — G919 Hydrocephalus, unspecified: Secondary | ICD-10-CM

## 2019-05-31 DIAGNOSIS — R000 Tachycardia, unspecified: Secondary | ICD-10-CM

## 2019-05-31 DIAGNOSIS — F988 Other specified behavioral and emotional disorders with onset usually occurring in childhood and adolescence: Secondary | ICD-10-CM

## 2019-05-31 DIAGNOSIS — N390 Urinary tract infection, site not specified: Secondary | ICD-10-CM

## 2019-05-31 DIAGNOSIS — I429 Cardiomyopathy, unspecified: Secondary | ICD-10-CM

## 2019-05-31 DIAGNOSIS — R339 Retention of urine, unspecified: Secondary | ICD-10-CM

## 2019-05-31 DIAGNOSIS — Q0700 Arnold-Chiari syndrome without spina bifida or hydrocephalus: Secondary | ICD-10-CM

## 2019-05-31 DIAGNOSIS — N319 Neuromuscular dysfunction of bladder, unspecified: Secondary | ICD-10-CM

## 2019-05-31 DIAGNOSIS — G4733 Obstructive sleep apnea (adult) (pediatric): Secondary | ICD-10-CM

## 2019-05-31 DIAGNOSIS — Q059 Spina bifida, unspecified: Secondary | ICD-10-CM

## 2019-05-31 DIAGNOSIS — I10 Essential (primary) hypertension: Secondary | ICD-10-CM

## 2019-05-31 LAB — COMPREHENSIVE METABOLIC PANEL
Lab: 0.3 mg/dL (ref 0.3–1.2)
Lab: 0.6 mg/dL (ref 0.4–1.24)
Lab: 103 MMOL/L — ABNORMAL LOW (ref 98–110)
Lab: 139 MMOL/L (ref 137–147)
Lab: 16 mg/dL (ref 7–25)
Lab: 20 U/L (ref 7–40)
Lab: 27 MMOL/L (ref 21–30)
Lab: 28 U/L (ref 7–56)
Lab: 3.8 MMOL/L — ABNORMAL LOW (ref 3.5–5.1)
Lab: 3.9 g/dL (ref 3.5–5.0)
Lab: 7.3 g/dL (ref 6.0–8.0)
Lab: 89 U/L (ref 25–110)
Lab: 9.2 mg/dL — ABNORMAL HIGH (ref 8.5–10.6)
Lab: 93 mg/dL (ref 70–100)
Lab: >60 mL/min (ref >60)
Lab: >60 mL/min (ref >60)
Lab: 9 (ref 3–12)

## 2019-05-31 LAB — CBC: Lab: 9.6 10*3/uL (ref 4.5–11.0)

## 2019-05-31 NOTE — Pre-Anesthesia Patient Instructions
GENERAL INFORMATION    Before you come to the hospital  ? Make arrangements for a responsible adult to drive you home and stay with you for 24 hours following surgery.  ? Bath/Shower Instructions  ? Take a bath or shower using the special soap given to you in PAC. Use half the bottle the night before, and the other half the morning of your procedure. Use clean towels with each bath or shower.  ? Put on clean clothes after bath or shower.  Avoid using lotion and oils.  ? If you are having surgery above the waist, wear a shirt that fastens up the front.  ? Sleep on clean sheets if bath or shower is done the night before procedure.  ? Leave money, credit cards, jewelry, and any other valuables at home. The [facility] is not responsible for the loss or breakage of personal items.  ? Remove nail polish, makeup and all jewelry (including piercings) before coming to the hospital.  ? The morning of your procedure:  ? brush your teeth and tongue  ? do not smoke  ? do not shave the area where you will have surgery    What to bring to the hospital  ? ID/ Insurance Card  ? Medical Device card  ? Official documents for legal guardianship   ? Copy of your Living Will, Advanced Directives, and/or Durable Power of Attorney   ? Small bag with a few personal belongings  ? CPAP/BiPAP machine (including all supplies)  ? Walker,cane, or motorized scooter  ? Cases for glasses/hearing aids/contact lens (bring solutions for contacts)  ? Dress in clean, loose, comfortable clothing     Eating or drinking before surgery  ? Do not eat or drink anything after 11:00 p.m. the day before your procedure (including gum, mints, candy, or chewing tobacco) OR follow the specific instructions you were given by your Surgeon.  ? You may have WATER ONLY up to 2 hours before arriving at the hospital.     Other instructions  Notify your surgeon if:  ? you become ill with a cough, fever, sore throat, nausea, vomiting or flu-like symptoms  ? you develop a rash or have any open wounds/sores that are red, painful, draining, or are new since you last saw  the doctor  ? you need to cancel your procedure  ? You will receive a call with your surgery arrival time from between 2:30pm and 4:30pm the last business day before your procedure.  If you do not receive a call, please call [phone] before 4:30pm or [phone] after 4:30pm.    Notify us at [facility]: [phone]  ? if you need to cancel your procedure  ? if you are going to be late    Arrival at the hospital  [facility]  [address]  [place], [place] [address]    ? Park in the [employer], located directly across from the main entrance to the hospital.  ? [place]ee [place] parking is available  from 7 AM to 4 PM Monday through Friday.  ? Enter through the ground floor main hospital entrance and check in at the Information Desk in the lobby.  ? They will validate your parking ticket and direct you to the next location.      Coronavirus (COVID19) Information  If you get sick with fever (100.60F/38C or higher), cough, or have trouble breathing:  ? Call your primary care physician for questions or health  needs.  ? Tell your doctor about any recent travel and your symptoms.  ? Check your MyChart for your COVID swab results. (MyChart notifications are immediate and patients are often know their test result before their surgeon is notified).  ? Notify your surgeon if you are COVID+ positive.  ? If you are COVID+ positive DO NOT come to the hospital for your surgery until your surgeon has instructed you on what to do. Wait for instructions to find out if you should stay home or if you should still have surgery.  ? Avoid contact with others.    For up to date information on the Coronavirus, visit the CDC website at DiningCalendar.de.      For the safety of all patients, visitors and staff as we work to contain COVID-19, we must restrict patient visitors.      Current Visitor Policy (03/20/19): Only ONE visitor may accompany a surgical or procedural patient visit.  Two parents/guardians are allowed for surgical or procedural patients younger than 31 years old.  Inpatients may have TWO daily visitors, but only ONE of those visitors is allowed at a time. Both visitors cannot visit at the same time.  End-of-life patients may be allowed additional support persons.  Visitors should check with the patient's nurse.  Only cancer patients at their exam visit may have one visitor with them.  No visitors are allowed for cancer patients receiving treatment/infusion services.  This applies at all cancer center locations.  Restrictions still apply for patients who test positive for COVID-19 (no visitors).    Visitors will continue to be screened at all entrances.  They must be free of fever and symptoms to be in our facilities.  We ask visitors to follow these guidelines:  Wear a mask at all times.  Go directly to the nursing station in the unit you are visiting and do not linger in public areas.  Check in at the nursing station before going to the patient's room.  Maintain a physical distance of six feet from all others.  Follow elevator restrictions to four riding at a time - peak times are 6:30-7:30 a.m., noon and 6:30-7:30 p.m.  Be aware cafeteria peak times are 11 a.m. - 1 p.m.  Wash your hands frequently and cover your coughs and sneezes.

## 2019-06-01 ENCOUNTER — Encounter: Admit: 2019-06-01 | Discharge: 2019-06-01 | Payer: MEDICARE

## 2019-06-01 NOTE — Telephone Encounter
PAC called asking for cardiac clearance for 5/257 OV.     Procedure: PERCUTANEOUS NEPHROSTOLITHOTOMY/ PYELOSTOLITHOTOMY - 2 CM OR LESS

## 2019-06-07 ENCOUNTER — Encounter: Admit: 2019-06-07 | Discharge: 2019-06-07 | Payer: MEDICARE

## 2019-06-07 MED ORDER — SODIUM CHLORIDE 0.9 % IV SOLP
1000 mL | INTRAVENOUS | 0 refills | Status: DC
Start: 2019-06-07 — End: 2019-06-08
  Administered 2019-06-07: 18:00:00 1000 mL via INTRAVENOUS

## 2019-06-07 MED ORDER — LEVETIRACETAM 500 MG PO TAB
500 mg | Freq: Two times a day (BID) | ORAL | 0 refills | Status: DC
Start: 2019-06-07 — End: 2019-06-12
  Administered 2019-06-08 – 2019-06-12 (×10): 500 mg via ORAL

## 2019-06-07 MED ORDER — FENTANYL CITRATE (PF) 50 MCG/ML IJ SOLN
25 ug | INTRAVENOUS | 0 refills | Status: DC | PRN
Start: 2019-06-07 — End: 2019-06-08
  Administered 2019-06-07: 23:00:00 25 ug via INTRAVENOUS

## 2019-06-07 MED ORDER — DIPHENHYDRAMINE HCL 25 MG PO CAP
25 mg | ORAL | 0 refills | Status: DC | PRN
Start: 2019-06-07 — End: 2019-06-12
  Administered 2019-06-08 – 2019-06-10 (×3): 25 mg via ORAL

## 2019-06-07 MED ORDER — SENNOSIDES-DOCUSATE SODIUM 8.6-50 MG PO TAB
1 | Freq: Two times a day (BID) | ORAL | 0 refills | Status: DC
Start: 2019-06-07 — End: 2019-06-12

## 2019-06-07 MED ORDER — OXYCODONE 5 MG PO TAB
5-10 mg | Freq: Once | ORAL | 0 refills | Status: CP | PRN
Start: 2019-06-07 — End: ?
  Administered 2019-06-07: 23:00:00 10 mg via ORAL

## 2019-06-07 MED ORDER — POLYETHYLENE GLYCOL 3350 17 GRAM PO PWPK
1 | Freq: Two times a day (BID) | ORAL | 0 refills | Status: DC
Start: 2019-06-07 — End: 2019-06-12

## 2019-06-07 MED ORDER — OXYCODONE 5 MG PO TAB
5-10 mg | ORAL | 0 refills | Status: DC | PRN
Start: 2019-06-07 — End: 2019-06-12
  Administered 2019-06-08 – 2019-06-09 (×7): 10 mg via ORAL

## 2019-06-07 MED ORDER — ACETAMINOPHEN 325 MG PO TAB
650 mg | ORAL | 0 refills | Status: DC | PRN
Start: 2019-06-07 — End: 2019-06-12
  Administered 2019-06-07 – 2019-06-08 (×4): 650 mg via ORAL

## 2019-06-07 MED ORDER — HALOPERIDOL LACTATE 5 MG/ML IJ SOLN
1 mg | Freq: Once | INTRAVENOUS | 0 refills | Status: DC | PRN
Start: 2019-06-07 — End: 2019-06-08

## 2019-06-07 MED ORDER — PROPOFOL INJ 10 MG/ML IV VIAL
INTRAVENOUS | 0 refills | Status: DC
Start: 2019-06-07 — End: 2019-06-07
  Administered 2019-06-07: 21:00:00 100 mg via INTRAVENOUS

## 2019-06-07 MED ORDER — PROMETHAZINE 25 MG/ML IJ SOLN
6.25 mg | INTRAVENOUS | 0 refills | Status: DC | PRN
Start: 2019-06-07 — End: 2019-06-08

## 2019-06-07 MED ORDER — ONDANSETRON 4 MG PO TBDI
4 mg | ORAL | 0 refills | Status: DC | PRN
Start: 2019-06-07 — End: 2019-06-12

## 2019-06-07 MED ORDER — ROCURONIUM 10 MG/ML IV SOLN
INTRAVENOUS | 0 refills | Status: DC
Start: 2019-06-07 — End: 2019-06-07
  Administered 2019-06-07: 21:00:00 60 mg via INTRAVENOUS
  Administered 2019-06-07: 22:00:00 10 mg via INTRAVENOUS

## 2019-06-07 MED ORDER — LEVOFLOXACIN IN D5W 750 MG/150 ML IV PGBK
750 mg | Freq: Once | INTRAVENOUS | 0 refills | Status: CP
Start: 2019-06-07 — End: ?
  Administered 2019-06-07: 18:00:00 750 mg via INTRAVENOUS

## 2019-06-07 MED ORDER — DEXMEDETOMIDINE# 4MCG/ML IV SOLN
INTRAVENOUS | 0 refills | Status: DC
Start: 2019-06-07 — End: 2019-06-07
  Administered 2019-06-07 (×5): 4 ug via INTRAVENOUS

## 2019-06-07 MED ORDER — ONDANSETRON HCL (PF) 4 MG/2 ML IJ SOLN
INTRAVENOUS | 0 refills | Status: DC
Start: 2019-06-07 — End: 2019-06-07
  Administered 2019-06-07: 22:00:00 4 mg via INTRAVENOUS

## 2019-06-07 MED ORDER — IOHEXOL 300 MG IODINE/ML IV SOLN
0 refills | Status: DC
Start: 2019-06-07 — End: 2019-06-07
  Administered 2019-06-07: 21:00:00 50 mL via INTRAVESICAL

## 2019-06-07 MED ORDER — LEVOFLOXACIN IN D5W 500 MG/100 ML IV PGBK
500 mg | INTRAVENOUS | 0 refills | Status: DC
Start: 2019-06-07 — End: 2019-06-09
  Administered 2019-06-08: 17:00:00 500 mg via INTRAVENOUS

## 2019-06-07 MED ORDER — LACTATED RINGERS IV SOLP
INTRAVENOUS | 0 refills | Status: DC
Start: 2019-06-07 — End: 2019-06-08
  Administered 2019-06-08: 02:00:00 1000.000 mL via INTRAVENOUS

## 2019-06-07 MED ORDER — CARVEDILOL 6.25 MG PO TAB
6.25 mg | Freq: Two times a day (BID) | ORAL | 0 refills | Status: DC
Start: 2019-06-07 — End: 2019-06-12
  Administered 2019-06-08 – 2019-06-12 (×9): 6.25 mg via ORAL

## 2019-06-07 MED ORDER — SPIRONOLACTONE 25 MG PO TAB
25 mg | Freq: Every day | ORAL | 0 refills | Status: DC
Start: 2019-06-07 — End: 2019-06-12
  Administered 2019-06-09 – 2019-06-12 (×4): 25 mg via ORAL

## 2019-06-07 MED ORDER — MIDAZOLAM 1 MG/ML IJ SOLN
INTRAVENOUS | 0 refills | Status: DC
Start: 2019-06-07 — End: 2019-06-07
  Administered 2019-06-07: 20:00:00 2 mg via INTRAVENOUS

## 2019-06-07 MED ORDER — HYDROMORPHONE (PF) 2 MG/ML IJ SYRG
.5 mg | INTRAVENOUS | 0 refills | Status: DC | PRN
Start: 2019-06-07 — End: 2019-06-08
  Administered 2019-06-07 (×3): 0.5 mg via INTRAVENOUS

## 2019-06-07 MED ORDER — LIDOCAINE (PF) 200 MG/10 ML (2 %) IJ SYRG
INTRAVENOUS | 0 refills | Status: DC
Start: 2019-06-07 — End: 2019-06-07
  Administered 2019-06-07: 21:00:00 100 mg via INTRAVENOUS

## 2019-06-07 MED ORDER — SODIUM CHLORIDE 0.9% IRRIGATION BAG
0 refills | Status: DC
Start: 2019-06-07 — End: 2019-06-07
  Administered 2019-06-07: 22:00:00 15000 mL

## 2019-06-07 MED ORDER — LIDOCAINE (PF) 10 MG/ML (1 %) IJ SOLN
.2 mL | INTRAMUSCULAR | 0 refills | Status: DC | PRN
Start: 2019-06-07 — End: 2019-06-08

## 2019-06-07 MED ORDER — HYDROMORPHONE (PF) 2 MG/ML IJ SYRG
1 mg | INTRAVENOUS | 0 refills | Status: DC | PRN
Start: 2019-06-07 — End: 2019-06-08

## 2019-06-07 MED ORDER — ARTIFICIAL TEARS SINGLE DOSE DROPS GROUP
OPHTHALMIC | 0 refills | Status: DC
Start: 2019-06-07 — End: 2019-06-07
  Administered 2019-06-07: 21:00:00 2 [drp] via OPHTHALMIC

## 2019-06-07 MED ORDER — ONDANSETRON HCL (PF) 4 MG/2 ML IJ SOLN
4 mg | Freq: Once | INTRAVENOUS | 0 refills | Status: DC | PRN
Start: 2019-06-07 — End: 2019-06-08

## 2019-06-07 MED ORDER — SUGAMMADEX 100 MG/ML IV SOLN
INTRAVENOUS | 0 refills | Status: DC
Start: 2019-06-07 — End: 2019-06-07
  Administered 2019-06-07: 23:00:00 200 mg via INTRAVENOUS

## 2019-06-07 MED ORDER — FENTANYL CITRATE (PF) 50 MCG/ML IJ SOLN
INTRAVENOUS | 0 refills | Status: DC
Start: 2019-06-07 — End: 2019-06-07
  Administered 2019-06-07: 22:00:00 50 ug via INTRAVENOUS

## 2019-06-07 MED ORDER — FENTANYL CITRATE (PF) 50 MCG/ML IJ SOLN
25-50 ug | INTRAVENOUS | 0 refills | Status: DC | PRN
Start: 2019-06-07 — End: 2019-06-08
  Administered 2019-06-08: 09:00:00 50 ug via INTRAVENOUS

## 2019-06-07 MED ORDER — ONDANSETRON HCL (PF) 4 MG/2 ML IJ SOLN
4 mg | INTRAVENOUS | 0 refills | Status: DC | PRN
Start: 2019-06-07 — End: 2019-06-12

## 2019-06-07 MED ORDER — IOPAMIDOL 61 % 100 ML IV SOLN (OT)(OSM)
0 refills | Status: DC
Start: 2019-06-07 — End: 2019-06-07
  Administered 2019-06-07: 21:00:00 50 mL via INTRAMUSCULAR

## 2019-06-07 NOTE — Telephone Encounter
Would like to know if Dr.Stone will follow and sign orders for home health on this patient.

## 2019-06-07 NOTE — Telephone Encounter
Called [facility] - left a message on voicemail of Maralyn Sago that Dr. Larina Bras will follow and sign orders for home health on this patient.

## 2019-06-08 ENCOUNTER — Encounter: Admit: 2019-06-08 | Discharge: 2019-06-08 | Payer: MEDICARE

## 2019-06-08 MED ORDER — POLYETHYLENE GLYCOL 3350 17 GRAM PO PWPK
17 g | Freq: Two times a day (BID) | ORAL | 0 refills | Status: CN
Start: 2019-06-08 — End: ?

## 2019-06-08 MED ORDER — OXYCODONE 5 MG PO TAB
5-10 mg | ORAL_TABLET | ORAL | 0 refills | Status: CN | PRN
Start: 2019-06-08 — End: ?

## 2019-06-08 MED ORDER — POLYETHYLENE GLYCOL 3350 17 GRAM PO PWPK
17 g | Freq: Two times a day (BID) | ORAL | 3 refills | Status: CN
Start: 2019-06-08 — End: ?

## 2019-06-08 MED ORDER — SENNOSIDES-DOCUSATE SODIUM 8.6-50 MG PO TAB
1 | Freq: Two times a day (BID) | ORAL | 3 refills | Status: CN
Start: 2019-06-08 — End: ?

## 2019-06-08 MED ORDER — OXYCODONE 5 MG PO TAB
5-10 mg | ORAL | 0 refills | Status: CN | PRN
Start: 2019-06-08 — End: ?

## 2019-06-08 MED ORDER — SENNOSIDES-DOCUSATE SODIUM 8.6-50 MG PO TAB
1 | Freq: Two times a day (BID) | ORAL | 0 refills | Status: CN
Start: 2019-06-08 — End: ?

## 2019-06-09 ENCOUNTER — Encounter: Admit: 2019-06-09 | Discharge: 2019-06-09 | Payer: MEDICARE

## 2019-06-09 DIAGNOSIS — R51 Headache: Secondary | ICD-10-CM

## 2019-06-09 DIAGNOSIS — N390 Urinary tract infection, site not specified: Secondary | ICD-10-CM

## 2019-06-09 DIAGNOSIS — I429 Cardiomyopathy, unspecified: Secondary | ICD-10-CM

## 2019-06-09 DIAGNOSIS — N319 Neuromuscular dysfunction of bladder, unspecified: Secondary | ICD-10-CM

## 2019-06-09 DIAGNOSIS — N200 Calculus of kidney: Secondary | ICD-10-CM

## 2019-06-09 DIAGNOSIS — R000 Tachycardia, unspecified: Secondary | ICD-10-CM

## 2019-06-09 DIAGNOSIS — Q0700 Arnold-Chiari syndrome without spina bifida or hydrocephalus: Secondary | ICD-10-CM

## 2019-06-09 DIAGNOSIS — F988 Other specified behavioral and emotional disorders with onset usually occurring in childhood and adolescence: Secondary | ICD-10-CM

## 2019-06-09 DIAGNOSIS — R339 Retention of urine, unspecified: Secondary | ICD-10-CM

## 2019-06-09 DIAGNOSIS — R569 Unspecified convulsions: Secondary | ICD-10-CM

## 2019-06-09 DIAGNOSIS — Q059 Spina bifida, unspecified: Principal | ICD-10-CM

## 2019-06-09 DIAGNOSIS — G919 Hydrocephalus, unspecified: Secondary | ICD-10-CM

## 2019-06-09 DIAGNOSIS — T85618A Breakdown (mechanical) of other specified internal prosthetic devices, implants and grafts, initial encounter: Secondary | ICD-10-CM

## 2019-06-09 DIAGNOSIS — Z01810 Encounter for preprocedural cardiovascular examination: Secondary | ICD-10-CM

## 2019-06-09 DIAGNOSIS — G4733 Obstructive sleep apnea (adult) (pediatric): Secondary | ICD-10-CM

## 2019-06-09 DIAGNOSIS — I10 Essential (primary) hypertension: Secondary | ICD-10-CM

## 2019-06-09 MED ORDER — LEVOFLOXACIN 500 MG PO TAB
500 mg | ORAL | 0 refills | Status: DC
Start: 2019-06-09 — End: 2019-06-11
  Administered 2019-06-09 – 2019-06-10 (×2): 500 mg via ORAL

## 2019-06-11 MED ORDER — LEVOFLOXACIN 500 MG PO TAB
500 mg | Freq: Every day | ORAL | 0 refills | Status: DC
Start: 2019-06-11 — End: 2019-06-12
  Administered 2019-06-11 – 2019-06-12 (×2): 500 mg via ORAL

## 2019-06-11 MED ORDER — ENOXAPARIN 40 MG/0.4 ML SC SYRG
40 mg | Freq: Every day | SUBCUTANEOUS | 0 refills | Status: DC
Start: 2019-06-11 — End: 2019-06-12

## 2019-06-12 ENCOUNTER — Encounter: Admit: 2019-06-12 | Discharge: 2019-06-12 | Payer: MEDICARE

## 2019-06-12 DIAGNOSIS — R569 Unspecified convulsions: Secondary | ICD-10-CM

## 2019-06-12 DIAGNOSIS — I10 Essential (primary) hypertension: Secondary | ICD-10-CM

## 2019-06-12 DIAGNOSIS — Q059 Spina bifida, unspecified: Secondary | ICD-10-CM

## 2019-06-12 DIAGNOSIS — N390 Urinary tract infection, site not specified: Secondary | ICD-10-CM

## 2019-06-12 DIAGNOSIS — Z01810 Encounter for preprocedural cardiovascular examination: Secondary | ICD-10-CM

## 2019-06-12 DIAGNOSIS — R51 Headache: Secondary | ICD-10-CM

## 2019-06-12 DIAGNOSIS — G919 Hydrocephalus, unspecified: Secondary | ICD-10-CM

## 2019-06-12 DIAGNOSIS — F988 Other specified behavioral and emotional disorders with onset usually occurring in childhood and adolescence: Secondary | ICD-10-CM

## 2019-06-12 DIAGNOSIS — N200 Calculus of kidney: Secondary | ICD-10-CM

## 2019-06-12 DIAGNOSIS — G4733 Obstructive sleep apnea (adult) (pediatric): Secondary | ICD-10-CM

## 2019-06-12 DIAGNOSIS — I429 Cardiomyopathy, unspecified: Secondary | ICD-10-CM

## 2019-06-12 DIAGNOSIS — R000 Tachycardia, unspecified: Secondary | ICD-10-CM

## 2019-06-12 DIAGNOSIS — T85618A Breakdown (mechanical) of other specified internal prosthetic devices, implants and grafts, initial encounter: Secondary | ICD-10-CM

## 2019-06-12 DIAGNOSIS — N319 Neuromuscular dysfunction of bladder, unspecified: Secondary | ICD-10-CM

## 2019-06-12 DIAGNOSIS — Q0700 Arnold-Chiari syndrome without spina bifida or hydrocephalus: Secondary | ICD-10-CM

## 2019-06-12 DIAGNOSIS — R339 Retention of urine, unspecified: Secondary | ICD-10-CM

## 2019-06-12 MED ORDER — POLYETHYLENE GLYCOL 3350 17 GRAM PO PWPK
17 g | Freq: Two times a day (BID) | ORAL | 0 refills | 18.00000 days | Status: AC
Start: 2019-06-12 — End: ?

## 2019-06-12 MED ORDER — SENNOSIDES-DOCUSATE SODIUM 8.6-50 MG PO TAB
1 | Freq: Two times a day (BID) | ORAL | 0 refills | Status: AC
Start: 2019-06-12 — End: ?

## 2019-06-27 ENCOUNTER — Ambulatory Visit: Admit: 2019-06-27 | Discharge: 2019-06-28 | Payer: MEDICARE

## 2019-06-27 ENCOUNTER — Encounter: Admit: 2019-06-27 | Discharge: 2019-06-27 | Payer: MEDICARE

## 2019-06-27 DIAGNOSIS — N319 Neuromuscular dysfunction of bladder, unspecified: Secondary | ICD-10-CM

## 2019-06-27 DIAGNOSIS — R569 Unspecified convulsions: Secondary | ICD-10-CM

## 2019-06-27 DIAGNOSIS — R51 Headache: Secondary | ICD-10-CM

## 2019-06-27 DIAGNOSIS — G919 Hydrocephalus, unspecified: Secondary | ICD-10-CM

## 2019-06-27 DIAGNOSIS — R339 Retention of urine, unspecified: Secondary | ICD-10-CM

## 2019-06-27 DIAGNOSIS — G4733 Obstructive sleep apnea (adult) (pediatric): Secondary | ICD-10-CM

## 2019-06-27 DIAGNOSIS — Z01810 Encounter for preprocedural cardiovascular examination: Secondary | ICD-10-CM

## 2019-06-27 DIAGNOSIS — I429 Cardiomyopathy, unspecified: Secondary | ICD-10-CM

## 2019-06-27 DIAGNOSIS — Q0700 Arnold-Chiari syndrome without spina bifida or hydrocephalus: Secondary | ICD-10-CM

## 2019-06-27 DIAGNOSIS — T85618A Breakdown (mechanical) of other specified internal prosthetic devices, implants and grafts, initial encounter: Secondary | ICD-10-CM

## 2019-06-27 DIAGNOSIS — R000 Tachycardia, unspecified: Secondary | ICD-10-CM

## 2019-06-27 DIAGNOSIS — F988 Other specified behavioral and emotional disorders with onset usually occurring in childhood and adolescence: Secondary | ICD-10-CM

## 2019-06-27 DIAGNOSIS — Q059 Spina bifida, unspecified: Secondary | ICD-10-CM

## 2019-06-27 DIAGNOSIS — N200 Calculus of kidney: Secondary | ICD-10-CM

## 2019-06-27 DIAGNOSIS — I10 Essential (primary) hypertension: Secondary | ICD-10-CM

## 2019-06-27 DIAGNOSIS — N390 Urinary tract infection, site not specified: Secondary | ICD-10-CM

## 2019-06-27 MED ORDER — LEVETIRACETAM 500 MG PO TAB
500 mg | ORAL_TABLET | Freq: Two times a day (BID) | ORAL | 3 refills | 90.00000 days | Status: DC
Start: 2019-06-27 — End: 2019-07-20

## 2019-06-27 NOTE — Progress Notes
Patient Name: Allen Gill  Age: 29 y.o.  Sex: male    Encounter Date: 06/27/2019  Date of tele-health visit:06/27/19  MRN: [id number]  PCP: Orson Gear A     Reason for visit/ chief complaint:  Seizure/epilepsy follow up     INTERVAL SUMMARY        Allen Gill's last visit was on 07/05/2018.  Since then he has been seizure free. He is currently on levetiracetam 500mg  bid and denies any side effects. He recently was admitted for kidney stones removal but is doing well since.      Previously documented HPI (copied and reviewed)     Allen Gill returned for followup regarding spells of losing periods of time.? The last spell was in May 2012.? He has not had any event concerning for his seizures since the last visit.? He is on Keppra and tolerates Keppra well without any side effects.?In th interim he had a right ventriculoperitoneal shunt placed in June. Since then his symptoms improved. He denies any new systemic or neurological complaints at this point.     REVIEW OF SYSTEMS     Review of Systems:   A 10-point ROS is negative with the exception of pertinent positives as noted above in the HPI.     PAST MEDICAL HISTORY     Medical History:   Diagnosis Date   ? 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 Arnold-Chiari malformation (HCC)    ? Cardiomyopathy (HCC)    ? Headache(784.0)    ? Hydrocephalus (HCC)    ? Hypertension    ? Infection of VP shunt    ? Kidney stones     frequent   ? Myelomeningocele (HCC)    ? Neurogenic bladder    ? OSA (obstructive sleep apnea)    ? Preop cardiovascular exam 07/30/2016   ? Seizures (HCC)     blank staring spells in childhood - last absentee seizure 2012   ? Shunt malfunction    ? Spina bifida (HCC)    ? Tachycardia 07/30/2016   ? Urinary retention 02/19/2016    Added automatically from request for surgery [id number]   ? Urinary tract infection     frequent       Patient Active Problem List    Diagnosis Date Noted   ? Complex care coordination 09/14/2016   ? Neurogenic bowel 06/12/2019   ? Nephrolithiasis 06/07/2019   ? Septic shock (HCC) 05/02/2019   ? Hypotension 05/02/2019   ? Acute respiratory failure with hypoxia and hypercapnia (HCC) 05/02/2019   ? Aspiration into respiratory tract 05/02/2019   ? GERD (gastroesophageal reflux disease) 05/02/2019   ? Right nephrolithiasis 05/02/2019   ? Hypokalemia 05/02/2019   ? Hypomagnesemia 05/02/2019   ? Hyperglycemia 05/02/2019   ? Leukocytosis 05/02/2019   ? Fever 05/02/2019   ? Small bowel obstruction (HCC) 11/28/2018   ? Chronic combined systolic (congestive) and diastolic (congestive) heart failure (HCC) 11/24/2018   ? UTI (urinary tract infection) 03/10/2018   ? Pyelonephritis 04/13/2017   ? Neurogenic bladder 12/16/2016   ? 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 Non-ischemic cardiomyopathy (HCC) 09/29/2016   ? Essential hypertension 07/30/2016   ? Urethral stone 07/31/2015   ? Morbid obesity (HCC) 05/19/2015   ? Seizure disorder (HCC) 05/19/2015   ? Transaminitis 05/19/2015   ? Hydrocephalus (HCC) 04/28/2015   ? Kidney stone 02/14/2012   ? 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  Arnold-Chiari malformation (HCC) 11/27/2009   ? ADD (attention deficit disorder) 10/28/2006   ? Spina bifida (HCC) 10/28/2006   ? Unspecified constipation 10/28/2006       PAST SURGICAL HISTORY     Surgical History:   Procedure Laterality Date   ? SHUNT CREATION VENTRICULAR PERITONEAL Right 06/14/2014    Performed by Angelia Mould, MD at [facility] OR   ? SHUNT REVISION VENTRICULAR PERITONEAL Right 04/23/2015    Performed by Angelia Mould, MD at [facility] OR   ? REMOVAL HARDWARE HEAD: removal of left ventriculopleural shunt Left 05/20/2015    Performed by Angelia Mould, MD at [facility] OR   ? SHUNT REMOVAL VENTRICULAR PERITONEAL Right 05/23/2015    Performed by Angelia Mould, MD at [facility] OR   ? SHUNT CREATION VENTRICULAR PERITONEAL Left 06/03/2015    Performed by Angelia Mould, MD at [facility] OR   ? CYSTOURETHROSCOPY, CYSTOLITHOLAPAXY N/A 08/05/2015    Performed by Pennie Banter, MD at [facility] OR   ? HOLMIUM LASER ENUCLEATION OF PROSTATE (NO MORCELLATION) N/A 08/19/2015    Performed by Vonna Drafts, MD at [facility] OR   ? CYSTOSCOPY N/A 08/19/2015    Performed by Vonna Drafts, MD at [facility] OR   ? CYSTOSCOPY EVACUATION CLOTS N/A 08/29/2015    Performed by Vonna Drafts, MD at [facility] OR   ? CYSTOSCOPY, URETHERAL DILATION N/A 02/20/2016    Performed by Vonna Drafts, MD at [facility] OR   ? CYSTECTOMY, ILEAL CONDUIT N/A 10/13/2016    Performed by Glennie Isle, MD at [facility] OR   ? REVISION SHUNT - VENTRICULO-PERITONEAL Left 12/16/2016    Performed by Storm Frisk, MD at [facility] OR   ? CREATION SHUNT - VENTRICULO-PERITONEAL: Left side. 2 hours, proximal catheter and valve in place. will need distal catheter placed, Dr. Joan Flores to complete. Supine Left 12/22/2016    Performed by Angelia Mould, MD at [facility] OR   ? PR REPRGRMG PROGRAMMABLE CEREBROSPINAL SHUNT  01/07/2017   ? PERCUTANEOUS NEPHROSTOLITHOTOMY/ PYELOSTOLITHOTOMY - GREATER THAN 2 CM Left 05/25/2017    Performed by Clarita Crane, MD, [facility] at [facility] OR   ? CREATION SHUNT - VENTRICULO-PERITONEAL Right 08/04/2017    Performed by Angelia Mould, MD at [facility] OR   ? PERCUTANEOUS NEPHROSTOLITHOTOMY/ PYELOSTOLITHOTOMY - 2 CM OR LESS Right 06/07/2019    Performed by Beckie Busing, MD at [facility] OR   ? CYSTOURETHROSCOPY WITH URETEROSCOPY AND/ OR PYELOSCOPY - WITH REMOVAL/ MANIPULATION CALCULUS Right 06/07/2019    Performed by Beckie Busing, MD at [facility] OR   ? CATHETER IMPLANT/REVISION  12/27/09    distal end of the catheter was revised   ? CATHETER IMPLANT/REVISION  01/31/10    replacement of ventricular catheter with [employer] framelessstereotaxis catheter   ? HX ABDOMEN SURGERY      fundoplication   ? HX ABDOMEN SURGERY  01/2001    Cecostomy   ? HX BACK SURGERY      repair of spina bifida   ? HX BRAIN SURGERY  5 months old    Chiari decompression   ? HX EAR TUBES     ? HX HERNIA REPAIR      inguinal hernia   ? HX SURGERY  at 2 weeks    VP Shunt   ? HX TONSILLECTOMY     ? HX TRACHEOSTOMY ? SHUNT REVISION  3 months old   ? SHUNT REVISION  28 years old   ? SHUNT REVISION  October 2010    [place]   ?  SHUNT REVISION  12/11/09    replacment of valve to acodman hakem adjustablevalve   ? SHUNT REVISION  02/17/10    left frontal ventriculopleural shunt   ? VENTRICULOSTOMY  02/03/10    removal of all shunt components and placementofright frontal ventriculostomy       FAMILY HISTORY     Family History   Problem Relation Age of Onset   ? Diabetes Father    ? Hypertension Father    ? Other Father         glaucoma   ? High Cholesterol Maternal Grandfather        SOCIAL HISTORY     Social History     Socioeconomic History   ? Marital status: Single     Spouse name: Not on file   ? Number of children: Not on file   ? Years of education: Not on file   ? Highest education level: Not on file   Occupational History   ? 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 Smoking status: Never Smoker   ? Smokeless tobacco: Never Used   Substance and Sexual Activity   ? Alcohol use: No   ? Drug use: No   ? Sexual activity: Not Currently     Partners: Female   Other Topics Concern   ? Not on file   Social History Narrative   ? Not on file       Social History     Substance and Sexual Activity   Drug Use No         Social History     Tobacco Use   Smoking Status Never Smoker   Smokeless Tobacco Never Used       MEDICATION     Current Outpatient Medications   Medication Sig Dispense Refill   ? acetaminophen (TYLENOL) 325 mg tablet Take two tablets by mouth every 6 hours as needed.  0   ? carvediloL (COREG) 6.25 mg tablet Take one tablet by mouth twice daily with meals. 180 tablet 3   ? cholecalciferol(+) (VITAMIN D3) 2,000 unit tablet Take one tablet by mouth daily. 90 tablet 0   ? cyanocobalamin (VITAMIN B-12) 1,000 mcg tablet Take one tablet by mouth daily. 30 tablet 0   ? fish oil /omega-3 fatty acids (SEA-OMEGA) 340/1000 mg capsule Take 1 capsule by mouth daily.     ? Leg Brace misc Bilateral Fitted leg brace and right shoe for foot drop.  Dx: spina bifida. 1 each 0   ? levETIRAcetam (KEPPRA) 500 mg tablet Take one tablet by mouth twice daily. 120 tablet 3   ? losartan (COZAAR) 25 mg tablet Take one tablet by mouth daily. 90 tablet 3   ? multivitamin with iron (MULTIPLE VITAMINS WITH IRON PO) Take 1 tablet by mouth daily.     ? Doctor, general practice 1 piece Urostomy bag.  Use 1 bag as needed. 10 each 10   ? polyethylene glycol 3350 (MIRALAX) 17 g packet Take one packet by mouth twice daily. 12 each    ? senna/docusate (SENOKOT-S) 8.6/50 mg tablet Take one tablet by mouth twice daily.     ? spironolactone (ALDACTONE) 25 mg tablet Take one tablet by mouth daily. Take with food. 90 tablet 3     No current facility-administered medications for this visit.       ALLERGIES     Allergies   Allergen Reactions   ?  Latex RASH and SHORTNESS OF BREATH   ? Amoxicillin RASH and STOMACH UPSET     08/03/17 discussed this w/ patient. Amox/clav in 2014. He certainly took it.  Notes from that time indicate diarrhea. Discussed this with him on 03/13/18 and he stated that he'd taken amox 3 times in past and each time had HA, debilitating fatigue and diarrhea. And maybe rash. At this time he was tolerating IV ampicillin. Manya Silvas, MD 03/13/18  Update Luchi MD, 11/28/18: He got IV ampicillin at [facility] 2/29 to 03/15/18 w/o rash or SE. He was started on Augmentin 05/08/19 without significant SE   ? Ceclor [Cefaclor] HIVES     Pt has tolerated ancef, keflex, ceftriaxone and cefepime (many prescriptions for these in med review in [place].   ? Clindamycin HIVES   ? Zosyn [Piperacillin-Tazobactam] HIVES       PHYSICAL EXAMINATION         PHYSICAL EXAM:    General:  ? Markeal Delconte is a well-appearing individual in no acute distress.   ? Head:  Head normocephalic, atraumatic.   ? Heart: normal rate  ? Lungs: no increased work of breathing  ? Abd: soft, nt, nd    Neuro:   ? Mental status: Awake and alert. Attentive  ? Mild sarthria and dysphonia   ? Language has intact comprehension; language content appears appropriate based on our conversation.   ? Pupils round and equal  ? Extraocular eye movements are full without nystagmus.   ? Face appears symmetric at rest and with muscle activation.   ? Hearing grossly intact.     ? Muscle strength intact other than 2/5 bilateral DF, 1/5 bilateral PF    Summary:  Evander Ryce is a 29 y.o. 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    Impression:  ? Localization-related epilepsy   ? S/p VP shunt      Plan:    -Continue AED regimen as follows:  --Keppra 500 mg BID   -Seizure precautions: No driving for 6 months since last seizure this is [place] state law. No climbing ladders, operating heavy machinery, cooking over a grill or stove, swimming or tub baths alone, or any activity during which acute and unexpected loss of consciousness could lead to injury to self or others.    RTC: 1 year     Fortunato Curling, MD  Epilepsy Fellow    ATTESTATION    I personally performed the key portions of the E/M visit, discussed case with resident and concur with resident documentation of history, physical exam, assessment, and treatment plan unless otherwise noted.    Staff name:  Maia Petties, MD Date:  06/27/2019

## 2019-06-28 DIAGNOSIS — G40209 Localization-related (focal) (partial) symptomatic epilepsy and epileptic syndromes with complex partial seizures, not intractable, without status epilepticus: Secondary | ICD-10-CM

## 2019-07-11 ENCOUNTER — Ambulatory Visit: Admit: 2019-07-11 | Discharge: 2019-07-12 | Payer: MEDICARE

## 2019-07-11 ENCOUNTER — Encounter: Admit: 2019-07-11 | Discharge: 2019-07-11 | Payer: MEDICARE

## 2019-07-11 DIAGNOSIS — F988 Other specified behavioral and emotional disorders with onset usually occurring in childhood and adolescence: Secondary | ICD-10-CM

## 2019-07-11 DIAGNOSIS — R51 Headache: Secondary | ICD-10-CM

## 2019-07-11 DIAGNOSIS — Q0700 Arnold-Chiari syndrome without spina bifida or hydrocephalus: Secondary | ICD-10-CM

## 2019-07-11 DIAGNOSIS — R339 Retention of urine, unspecified: Secondary | ICD-10-CM

## 2019-07-11 DIAGNOSIS — Q059 Spina bifida, unspecified: Secondary | ICD-10-CM

## 2019-07-11 DIAGNOSIS — T85618A Breakdown (mechanical) of other specified internal prosthetic devices, implants and grafts, initial encounter: Secondary | ICD-10-CM

## 2019-07-11 DIAGNOSIS — N200 Calculus of kidney: Secondary | ICD-10-CM

## 2019-07-11 DIAGNOSIS — I429 Cardiomyopathy, unspecified: Secondary | ICD-10-CM

## 2019-07-11 DIAGNOSIS — R569 Unspecified convulsions: Secondary | ICD-10-CM

## 2019-07-11 DIAGNOSIS — R000 Tachycardia, unspecified: Secondary | ICD-10-CM

## 2019-07-11 DIAGNOSIS — I10 Essential (primary) hypertension: Secondary | ICD-10-CM

## 2019-07-11 DIAGNOSIS — G919 Hydrocephalus, unspecified: Secondary | ICD-10-CM

## 2019-07-11 DIAGNOSIS — N390 Urinary tract infection, site not specified: Secondary | ICD-10-CM

## 2019-07-11 DIAGNOSIS — G4733 Obstructive sleep apnea (adult) (pediatric): Secondary | ICD-10-CM

## 2019-07-11 DIAGNOSIS — Z01810 Encounter for preprocedural cardiovascular examination: Secondary | ICD-10-CM

## 2019-07-11 DIAGNOSIS — N319 Neuromuscular dysfunction of bladder, unspecified: Secondary | ICD-10-CM

## 2019-07-11 MED ORDER — OSTOMY SUPPLIES 1 " MISC MISC
10 refills | Status: AC | PRN
Start: 2019-07-11 — End: ?

## 2019-07-11 NOTE — Patient Instructions
Dietary Modifications & Recommendations for Kidney Stone Prevention    Maintain High Fluid Intake (> 2500 - 3000 mL/ day; 2.5 - 3 liters/ day)  At least ten 10 oz glasses/ day  Sodium Restriction  Avoid high salt content foods & using the salt shaker  Limit sodium (Na) consumption to 2000 - 3000 mg/ day (Target = 2300 mg/ day)  Moderate Calcium Intake  Calcium (Ca) 1000 - 1200 mg/ day  Dietary sources of calcium preferred.  Dietary calcium supplements (pills) may be associated with increased stone formation risk.  Oxalate Restriction  Avoid nuts, spinach, chocolate, tea, potatoes, rhubarb, vitamin C supplements  Helpful resources indicating "low" & "high" oxalate foods:  https://regepi.[employer]wh.[employer].edu/health/Oxalate/files/Oxalate%20Content%20of%20Foods.xls  www.ohf.org/docs/Oxalate2008.pdf  Avoid Excessive Animal Protein Intake  Includes all animal protein (meat, poultry, fish)  3 - 7 ounces/ day  Increase Potassium-Rich Citrus Intake  Citric fruits & fruit juices (ex: oranges, lemons, limes, grapefruit)  Caution: grapefruit & grapefruit juice may interfere with the metabolism of many prescription medications, so discuss your medication list with you primary care provider or pharmacist prior to starting a grapefruit regimen.)  Increase Fruits & Vegetables consumption.  Minimize or Eliminate Carbonated Beverages (including Cola & Non-Cola beverages, especially those high in fructose) & Sugar Sweetened Punch.

## 2019-07-11 NOTE — Progress Notes
Date of Service: 07/11/2019     Subjective:             Allen Gill is a 29 y.o. male who presents for follow up.    History of Present Illness  Allen Gill is a 29 y.o. male with history of cystectomy and ileal conduit?for neurogenic bladder 2/2 spina bifida?with RIGHT kidney stones s/p RIGHT mini-PCNL and retrograde ureteroscopy on 06/07/19 presenting for follow up. Patient denies any recurrent episodes of flank pain or hematuria. Patient had increased his fluid intake but continues to drink diet sodas daily. CT abd/pelv from 06/07/19 without evidence of residual stone. Nephrostomy tube was removed prior to discharge.       Medical History:   Diagnosis Date   ? 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 Arnold-Chiari malformation (HCC)    ? Cardiomyopathy (HCC)    ? Headache(784.0)    ? Hydrocephalus (HCC)    ? Hypertension    ? Infection of VP shunt    ? Kidney stones     frequent   ? Myelomeningocele (HCC)    ? Neurogenic bladder    ? OSA (obstructive sleep apnea)    ? Preop cardiovascular exam 07/30/2016   ? Seizures (HCC)     blank staring spells in childhood - last absentee seizure 2012   ? Shunt malfunction    ? Spina bifida (HCC)    ? Tachycardia 07/30/2016   ? Urinary retention 02/19/2016    Added automatically from request for surgery [id number]   ? Urinary tract infection     frequent       Surgical History:   Procedure Laterality Date   ? SHUNT CREATION VENTRICULAR PERITONEAL Right 06/14/2014    Performed by Angelia Mould, MD at [facility] OR   ? SHUNT REVISION VENTRICULAR PERITONEAL Right 04/23/2015    Performed by Angelia Mould, MD at [facility] OR   ? REMOVAL HARDWARE HEAD: removal of left ventriculopleural shunt Left 05/20/2015    Performed by Angelia Mould, MD at [facility] OR   ? SHUNT REMOVAL VENTRICULAR PERITONEAL Right 05/23/2015    Performed by Angelia Mould, MD at [facility] OR   ? SHUNT CREATION VENTRICULAR PERITONEAL Left 06/03/2015    Performed by Angelia Mould, MD at [facility] OR   ? CYSTOURETHROSCOPY, CYSTOLITHOLAPAXY N/A 08/05/2015    Performed by Pennie Banter, MD at [facility] OR   ? HOLMIUM LASER ENUCLEATION OF PROSTATE (NO MORCELLATION) N/A 08/19/2015    Performed by Vonna Drafts, MD at [facility] OR   ? CYSTOSCOPY N/A 08/19/2015    Performed by Vonna Drafts, MD at [facility] OR   ? CYSTOSCOPY EVACUATION CLOTS N/A 08/29/2015    Performed by Vonna Drafts, MD at [facility] OR   ? CYSTOSCOPY, URETHERAL DILATION N/A 02/20/2016    Performed by Vonna Drafts, MD at [facility] OR   ? CYSTECTOMY, ILEAL CONDUIT N/A 10/13/2016    Performed by Glennie Isle, MD at [facility] OR   ? REVISION SHUNT - VENTRICULO-PERITONEAL Left 12/16/2016    Performed by Storm Frisk, MD at [facility] OR   ? CREATION SHUNT - VENTRICULO-PERITONEAL: Left side. 2 hours, proximal catheter and valve in place. will need distal catheter placed, Dr. Joan Flores to complete. Supine Left 12/22/2016    Performed by Angelia Mould, MD at [facility] OR   ? PR REPRGRMG PROGRAMMABLE CEREBROSPINAL SHUNT  01/07/2017   ? PERCUTANEOUS NEPHROSTOLITHOTOMY/ PYELOSTOLITHOTOMY - GREATER THAN 2 CM Left 05/25/2017  Performed by Clarita Crane, MD, [facility] OR   ? CREATION SHUNT - VENTRICULO-PERITONEAL Right 08/04/2017    Performed by Angelia Mould, MD at [facility] OR   ? PERCUTANEOUS NEPHROSTOLITHOTOMY/ PYELOSTOLITHOTOMY - 2 CM OR LESS Right 06/07/2019    Performed by Beckie Busing, MD at [facility] OR   ? CYSTOURETHROSCOPY WITH URETEROSCOPY AND/ OR PYELOSCOPY - WITH REMOVAL/ MANIPULATION CALCULUS Right 06/07/2019    Performed by Beckie Busing, MD at [facility] OR   ? CATHETER IMPLANT/REVISION  12/27/09    distal end of the catheter was revised   ? CATHETER IMPLANT/REVISION  01/31/10    replacement of ventricular catheter with [employer] framelessstereotaxis catheter   ? HX ABDOMEN SURGERY      fundoplication   ? HX ABDOMEN SURGERY  01/2001    Cecostomy   ? HX BACK SURGERY      repair of spina bifida   ? HX BRAIN SURGERY  5 months old    Chiari decompression   ? HX EAR TUBES     ? HX HERNIA REPAIR      inguinal hernia   ? HX SURGERY at 2 weeks    VP Shunt   ? HX TONSILLECTOMY     ? HX TRACHEOSTOMY     ? SHUNT REVISION  3 months old   ? SHUNT REVISION  29 years old   ? SHUNT REVISION  October 2010    [place]   ? SHUNT REVISION  12/11/09    replacment of valve to acodman hakem adjustablevalve   ? SHUNT REVISION  02/17/10    left frontal ventriculopleural shunt   ? VENTRICULOSTOMY  02/03/10    removal of all shunt components and placementofright frontal ventriculostomy       Family History   Problem Relation Age of Onset   ? Diabetes Father    ? Hypertension Father    ? Other Father         glaucoma   ? High Cholesterol Maternal Grandfather        Current Outpatient Medications   Medication Sig Dispense Refill   ? acetaminophen (TYLENOL) 325 mg tablet Take two tablets by mouth every 6 hours as needed.  0   ? carvediloL (COREG) 6.25 mg tablet Take one tablet by mouth twice daily with meals. 180 tablet 3   ? cholecalciferol(+) (VITAMIN D3) 2,000 unit tablet Take one tablet by mouth daily. 90 tablet 0   ? cyanocobalamin (VITAMIN B-12) 1,000 mcg tablet Take one tablet by mouth daily. 30 tablet 0   ? fish oil /omega-3 fatty acids (SEA-OMEGA) 340/1000 mg capsule Take 1 capsule by mouth daily.     ? Leg Brace misc Bilateral Fitted leg brace and right shoe for foot drop.  Dx: spina bifida. 1 each 0   ? levETIRAcetam (KEPPRA) 500 mg tablet Take one tablet by mouth twice daily. 120 tablet 3   ? losartan (COZAAR) 25 mg tablet Take one tablet by mouth daily. 90 tablet 3   ? multivitamin with iron (MULTIPLE VITAMINS WITH IRON PO) Take 1 tablet by mouth daily.     ? Doctor, general practice 1 piece Urostomy bag.  Use 1 bag as needed. 10 each 10   ? polyethylene glycol 3350 (MIRALAX) 17 g packet Take one packet by mouth twice daily. 12 each    ? senna/docusate (SENOKOT-S) 8.6/50 mg tablet Take one tablet by mouth twice daily.     ? spironolactone (ALDACTONE) 25  mg tablet Take one tablet by mouth daily. Take with food. 90 tablet 3     No current facility-administered medications for this visit.       Allergies   Allergen Reactions   ? Latex RASH and SHORTNESS OF BREATH   ? Amoxicillin RASH and STOMACH UPSET     08/03/17 discussed this w/ patient. Amox/clav in 2014. He certainly took it.  Notes from that time indicate diarrhea. Discussed this with him on 03/13/18 and he stated that he'd taken amox 3 times in past and each time had HA, debilitating fatigue and diarrhea. And maybe rash. At this time he was tolerating IV ampicillin. Manya Silvas, MD 03/13/18  Update Luchi MD, 11/28/18: He got IV ampicillin at [facility] 2/29 to 03/15/18 w/o rash or SE. He was started on Augmentin 05/08/19 without significant SE   ? Ceclor [Cefaclor] HIVES     Pt has tolerated ancef, keflex, ceftriaxone and cefepime (many prescriptions for these in med review in [place].   ? Clindamycin HIVES   ? Zosyn [Piperacillin-Tazobactam] HIVES       Social History     Socioeconomic History   ? Marital status: Single     Spouse name: Not on file   ? Number of children: Not on file   ? Years of education: Not on file   ? Highest education level: Not on file   Occupational History   ? 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 Smoking status: Never Smoker   ? Smokeless tobacco: Never Used   Substance and Sexual Activity   ? Alcohol use: No   ? Drug use: No   ? Sexual activity: Not Currently     Partners: Female   Other Topics Concern   ? Not on file   Social History Narrative   ? Not on file       Review of Systems   Constitutional: Negative for activity change, appetite change, chills, diaphoresis, fatigue, fever and unexpected weight change.   HENT: Negative for congestion, hearing loss, mouth sores and sinus pressure.    Eyes: Negative for visual disturbance.   Respiratory: Negative for apnea, cough, chest tightness and shortness of breath.    Cardiovascular: Negative for chest pain, palpitations and leg swelling.   Gastrointestinal: Negative for abdominal pain, blood in stool, constipation, diarrhea, nausea, rectal pain and vomiting.   Genitourinary: Negative for decreased urine volume, difficulty urinating, discharge, dysuria, enuresis, flank pain, frequency, genital sores, hematuria, penile pain, penile swelling, scrotal swelling, testicular pain and urgency.   Musculoskeletal: Negative for arthralgias, back pain, gait problem and myalgias.   Skin: Negative for rash and wound.   Neurological: Negative for dizziness, tremors, seizures, syncope, weakness, light-headedness, numbness and headaches.   Hematological: Negative for adenopathy. Does not bruise/bleed easily.   Psychiatric/Behavioral: Negative for decreased concentration and dysphoric mood. The patient is not nervous/anxious.        Objective:         ? acetaminophen (TYLENOL) 325 mg tablet Take two tablets by mouth every 6 hours as needed.   ? carvediloL (COREG) 6.25 mg tablet Take one tablet by mouth twice daily with meals.   ? cholecalciferol(+) (VITAMIN D3) 2,000 unit tablet Take one tablet by mouth daily.   ? cyanocobalamin (VITAMIN B-12) 1,000 mcg tablet Take one tablet by mouth daily.   ? fish oil /omega-3 fatty acids (SEA-OMEGA) 340/1000 mg capsule Take 1 capsule by mouth daily.   ?  Leg Brace misc Bilateral Fitted leg brace and right shoe for foot drop.  Dx: spina bifida.   ? levETIRAcetam (KEPPRA) 500 mg tablet Take one tablet by mouth twice daily.   ? losartan (COZAAR) 25 mg tablet Take one tablet by mouth daily.   ? multivitamin with iron (MULTIPLE VITAMINS WITH IRON PO) Take 1 tablet by mouth daily.   ? Doctor, general practice 1 piece Urostomy bag.  Use 1 bag as needed.   ? polyethylene glycol 3350 (MIRALAX) 17 g packet Take one packet by mouth twice daily.   ? senna/docusate (SENOKOT-S) 8.6/50 mg tablet Take one tablet by mouth twice daily.   ? spironolactone (ALDACTONE) 25 mg tablet Take one tablet by mouth daily. Take with food.     Vitals:    07/11/19 1524   BP: 122/79   Pulse: 97   PainSc: Zero     There is no height or weight on file to calculate BMI.     Physical Exam   Constitutional: He is oriented to person, place, and time. He appears well-developed and well-nourished. No distress.   HENT:   Head: Normocephalic and atraumatic.   Eyes: Pupils are equal, round, and reactive to light. Conjunctivae and EOM are normal. Right eye exhibits no discharge. Left eye exhibits no discharge.   Pulmonary/Chest: Effort normal.   Abdominal: Soft.   RLQ urostomy, pink patent draining clear yellow urine    Musculoskeletal:         General: Normal range of motion.      Cervical back: Normal range of motion and neck supple.   Neurological: He is alert and oriented to person, place, and time.   Skin: Skin is warm and dry. He is not diaphoretic.   Psychiatric: He has a normal mood and affect. His behavior is normal. Thought content normal.          Assessment and Plan:  Allen Gill is a 29 y.o. male with history of nephrolithiasis presenting for follow up. Doing well, asymptomatic. Will need repeat US in 3 months. Discussed dietary modification, increasing fluid intake, and cutting down of dark sodas. Patient understands and agrees with plan.     Plan  - Follow up in 3 months with [facility]  - Discussed diet to prevent stone formation        Patient seen and discussed with Dr. Luther Redo, who directed plan of care.    Saunders Revel, MD  Urology Resident  Pager [phone]    Orders Placed This Encounter   ? US RENAL BLADDER LTD   ? Ostomy Supplies 1  misc      ATTESTATION    I personally performed the key portions of the E/M visit, discussed case with resident and concur with resident documentation of history, physical exam, assessment, and treatment plan unless otherwise noted.    Staff name:  Beckie Busing, MD Date:  07/11/2019

## 2019-07-20 ENCOUNTER — Encounter: Admit: 2019-07-20 | Discharge: 2019-07-20 | Payer: MEDICARE

## 2019-07-20 DIAGNOSIS — G40209 Localization-related (focal) (partial) symptomatic epilepsy and epileptic syndromes with complex partial seizures, not intractable, without status epilepticus: Secondary | ICD-10-CM

## 2019-07-20 MED ORDER — LEVETIRACETAM 500 MG PO TAB
500 mg | ORAL_TABLET | Freq: Two times a day (BID) | ORAL | 5 refills | 90.00000 days | Status: AC
Start: 2019-07-20 — End: ?

## 2019-07-23 ENCOUNTER — Encounter: Admit: 2019-07-23 | Discharge: 2019-07-23 | Payer: MEDICARE

## 2019-07-23 MED ORDER — LEG BRACE MISC MISC
0 refills | Status: AC
Start: 2019-07-23 — End: ?

## 2019-07-23 NOTE — Telephone Encounter
Pt called asking for an order for new braces and send to [facility]  lov 12/13/18 hosp f/u

## 2019-07-26 ENCOUNTER — Encounter: Admit: 2019-07-26 | Discharge: 2019-07-26 | Payer: MEDICARE

## 2019-07-26 DIAGNOSIS — R05 Cough: Secondary | ICD-10-CM

## 2019-07-26 NOTE — Telephone Encounter
Pt reports possible need for chest x-ray. Wakes up with cough that produces yellow/white phlegm. Message forwarded to PCP for advise.

## 2019-07-26 NOTE — Telephone Encounter
Order and pt demographics faxed to [facility] @[phone]

## 2019-07-26 NOTE — Telephone Encounter
In view of his many medical problems I think that would be appropriate.  I will put in the order.  Also would like him to get a cbc, I will order that also. Windy Carina, MD

## 2019-07-26 NOTE — Progress Notes
CARE MANAGEMENT FOLLOWUP      Discussed with patient:   Feels like things are going pretty well.  Wakes up with hacky cough that produces yellow/white phlegm. Wonders if he should have a chest xray. Message forwarded to PCP regarding such.  Encouraged pt to drink more clear liquids. Has not tried Mucinex.  Would like update on orders to [facility] for braces. This RN will reach out to PCP's nurse regarding this.       PATIENT GOALS:  1.?Increase water intake.  ?  BARRIERS:   No barriers identified at this time.         Education:  Encouraged increased fluids to help thin secretions.  Message PCP for medical direction.        FUTURE APPOINTMENTS:    Upcoming appointment date and time and with whom scheduled:   Future Appointments   Date Time Provider Department Center   08/16/2019  2:00 PM Hiram Gash, MD [facility]   10/11/2019  2:45 PM [facility]   07/02/2020  2:00 PM Maia Petties, MD [id number] Neurology        Will continue to follow.

## 2019-07-27 ENCOUNTER — Ambulatory Visit: Admit: 2019-07-27 | Discharge: 2019-07-27 | Payer: MEDICARE

## 2019-07-27 ENCOUNTER — Encounter: Admit: 2019-07-27 | Discharge: 2019-07-27 | Payer: MEDICARE

## 2019-07-27 DIAGNOSIS — R05 Cough: Secondary | ICD-10-CM

## 2019-07-27 LAB — CBC AND DIFF
Lab: 1 % (ref 0–2)
Lab: 14 % (ref 11–15)
Lab: 14 g/dL (ref 13.5–16.5)
Lab: 2 % (ref 0–5)
Lab: 22 % — ABNORMAL LOW (ref 24–44)
Lab: 27 pg (ref 26–34)
Lab: 32 g/dL (ref 32.0–36.0)
Lab: 396 10*3/uL (ref 150–400)
Lab: 43 % (ref 40–50)
Lab: 5.2 M/UL (ref 4.4–5.5)
Lab: 5.4 10*3/uL (ref 1.8–7.0)
Lab: 67 % (ref 41–77)
Lab: 7.8 FL (ref 7–11)
Lab: 8 % (ref 4–12)
Lab: 8.1 10*3/uL (ref 4.5–11.0)
Lab: 82 FL (ref 80–100)

## 2019-07-31 ENCOUNTER — Encounter: Admit: 2019-07-31 | Discharge: 2019-07-31 | Payer: MEDICARE

## 2019-07-31 NOTE — Telephone Encounter
Patient notified of results--he states he is still not feeling better--per Dr Larina Bras, of patient not feeling better she can see him sooner that 08/16/19. Transferred to IM scheduling for an appt

## 2019-07-31 NOTE — Telephone Encounter
Patient calls requesting results from last week, xray and cbc ordered by Dr Arvilla Market

## 2019-08-06 ENCOUNTER — Encounter: Admit: 2019-08-06 | Discharge: 2019-08-06 | Payer: MEDICARE

## 2019-08-06 ENCOUNTER — Emergency Department: Admit: 2019-08-06 | Discharge: 2019-08-06 | Payer: MEDICARE

## 2019-08-06 MED ORDER — METHYLPREDNISOLONE SOD SUC(PF) 125 MG/2 ML IJ SOLR
125 mg | Freq: Once | INTRAVENOUS | 0 refills | Status: CP
Start: 2019-08-06 — End: ?
  Administered 2019-08-07: 06:00:00 125 mg via INTRAVENOUS

## 2019-08-07 ENCOUNTER — Emergency Department: Admit: 2019-08-07 | Discharge: 2019-08-07 | Payer: MEDICARE

## 2019-08-07 ENCOUNTER — Encounter: Admit: 2019-08-07 | Discharge: 2019-08-07 | Payer: MEDICARE

## 2019-08-07 MED ORDER — IOHEXOL 350 MG IODINE/ML IV SOLN
70 mL | Freq: Once | INTRAVENOUS | 0 refills | Status: CP
Start: 2019-08-07 — End: ?
  Administered 2019-08-07: 09:00:00 70 mL via INTRAVENOUS

## 2019-08-07 MED ORDER — SODIUM CHLORIDE 0.9 % IJ SOLN
50 mL | Freq: Once | INTRAVENOUS | 0 refills | Status: CP
Start: 2019-08-07 — End: ?
  Administered 2019-08-07: 09:00:00 50 mL via INTRAVENOUS

## 2019-08-07 NOTE — ED Notes
Pt amb to room - placed on monitor call bell in reach. Pt reports here to have wound around rt abd ileostomy checked. Pt states first noted wound on Friday. Pt states 'also wants to have more tests done on his lungs  Pt states for past 2 weeks has been having productive cough. PCP ordered xray - 2 weeks ago . Pt states has not had any improvement would like further evaluation.     Belongings: shirt, shorts, leg braces,phone, shoes, black backpack (contents not examined)     Medical History:   Diagnosis Date   ? 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 Arnold-Chiari malformation (HCC)    ? Cardiomyopathy (HCC)    ? Headache(784.0)    ? Hydrocephalus (HCC)    ? Hypertension    ? Infection of VP shunt    ? Kidney stones     frequent   ? Myelomeningocele (HCC)    ? Neurogenic bladder    ? OSA (obstructive sleep apnea)    ? Preop cardiovascular exam 07/30/2016   ? Seizures (HCC)     blank staring spells in childhood - last absentee seizure 2012   ? Shunt malfunction    ? Spina bifida (HCC)    ? Tachycardia 07/30/2016   ? Urinary retention 02/19/2016    Added automatically from request for surgery [id number]   ? Urinary tract infection     frequent

## 2019-08-08 ENCOUNTER — Encounter: Admit: 2019-08-08 | Discharge: 2019-08-08 | Payer: MEDICARE

## 2019-08-08 NOTE — Telephone Encounter
ED Discharge Follow Up  Reached patient: Yes  Admission Information  Hospital Name : [facility]  ED Admission Date: 08/06/2019 ED Discharge Date: 08/06/2019 Admission Diagnosis: Shortness of breath / cough  Discharge Diagnosis Wound check/Cough  Hospital Services: Unplanned  Today's call is 1 (calendar) days post discharge    Discharge Instruction Review  Did patient receive and understand discharge instructions? Yes  Are there concerns regarding the patient?s ADL?s ? No    Medication Reconciliation  Changes to pre-ED visit medications? No  Were new prescriptions filled?N/A  Meds reviewed and reconciled? Yes  ? acetaminophen (TYLENOL) 325 mg tablet Take two tablets by mouth every 6 hours as needed.   ? carvediloL (COREG) 6.25 mg tablet Take one tablet by mouth twice daily with meals.   ? cholecalciferol(+) (VITAMIN D3) 2,000 unit tablet Take one tablet by mouth daily.   ? cyanocobalamin (VITAMIN B-12) 1,000 mcg tablet Take one tablet by mouth daily.   ? fish oil /omega-3 fatty acids (SEA-OMEGA) 340/1000 mg capsule Take 1 capsule by mouth daily.   ? Leg Brace misc Bilateral Fitted leg brace and right shoe for foot drop.  Dx: spina bifida.   ? levETIRAcetam (KEPPRA) 500 mg tablet Take one tablet by mouth twice daily.   ? losartan (COZAAR) 25 mg tablet Take one tablet by mouth daily.   ? multivitamin with iron (MULTIPLE VITAMINS WITH IRON PO) Take 1 tablet by mouth daily.   ? Doctor, general practice 1 piece Urostomy bag.  Use 1 bag as needed.   ? polyethylene glycol 3350 (MIRALAX) 17 g packet Take one packet by mouth twice daily.   ? senna/docusate (SENOKOT-S) 8.6/50 mg tablet Take one tablet by mouth twice daily.   ? spironolactone (ALDACTONE) 25 mg tablet Take one tablet by mouth daily. Take with food.         Understanding Condition  Having any current symptoms? Yes, Sob and coughing phlegm are slightly better  Patient understands when to seek additional medical care? Yes  Is patient aware of [facility] locations?Yes  Urgent Care appropriate for this diagnosis? No  Other instructions provided: Pt instructed to call should any of his s/s increase in nature, or with any concerns this office an address.    Scheduling Follow-up Appointment  Upcoming appointment date and time and with whom scheduled:   Future Appointments   Date Time Provider Department Center   08/14/2019 11:45 AM [facility] OSTOMY RN [facility]   08/16/2019  2:00 PM Hiram Gash, MD [facility]   10/11/2019  2:45 PM [facility] Radiolog   07/02/2020  2:00 PM Maia Petties, MD [id number] Neurology     When was patient?s last PCP visit: 05/25/2018  PCP primary location:  [facility]  PCP appointment scheduled? No, patient declined appointment  Specialist appointment scheduled? No  Both PCP and Specialist appointment scheduled: No  Is assistance with transportation needed? No  MyChart message sent? Active in MyChart. No message sent.     ED Communication   Did Pt call Clinic prior to going to ED? Yes Referred to ED by clinic/provider and No available appointments at clinic  Reason patient went to ED: Clinic not open/after hours    Rush Farmer, RN

## 2019-08-14 ENCOUNTER — Encounter: Admit: 2019-08-14 | Discharge: 2019-08-14 | Payer: MEDICARE

## 2019-08-14 ENCOUNTER — Ambulatory Visit: Admit: 2019-08-14 | Discharge: 2019-08-15 | Payer: MEDICARE

## 2019-08-14 DIAGNOSIS — G919 Hydrocephalus, unspecified: Secondary | ICD-10-CM

## 2019-08-14 DIAGNOSIS — G4733 Obstructive sleep apnea (adult) (pediatric): Secondary | ICD-10-CM

## 2019-08-14 DIAGNOSIS — I10 Essential (primary) hypertension: Secondary | ICD-10-CM

## 2019-08-14 DIAGNOSIS — N390 Urinary tract infection, site not specified: Secondary | ICD-10-CM

## 2019-08-14 DIAGNOSIS — Z7189 Other specified counseling: Secondary | ICD-10-CM

## 2019-08-14 DIAGNOSIS — N200 Calculus of kidney: Secondary | ICD-10-CM

## 2019-08-14 DIAGNOSIS — R339 Retention of urine, unspecified: Secondary | ICD-10-CM

## 2019-08-14 DIAGNOSIS — R000 Tachycardia, unspecified: Secondary | ICD-10-CM

## 2019-08-14 DIAGNOSIS — R569 Unspecified convulsions: Secondary | ICD-10-CM

## 2019-08-14 DIAGNOSIS — F988 Other specified behavioral and emotional disorders with onset usually occurring in childhood and adolescence: Secondary | ICD-10-CM

## 2019-08-14 DIAGNOSIS — Q0700 Arnold-Chiari syndrome without spina bifida or hydrocephalus: Secondary | ICD-10-CM

## 2019-08-14 DIAGNOSIS — I429 Cardiomyopathy, unspecified: Secondary | ICD-10-CM

## 2019-08-14 DIAGNOSIS — Q059 Spina bifida, unspecified: Principal | ICD-10-CM

## 2019-08-14 DIAGNOSIS — N319 Neuromuscular dysfunction of bladder, unspecified: Secondary | ICD-10-CM

## 2019-08-14 DIAGNOSIS — T85618A Breakdown (mechanical) of other specified internal prosthetic devices, implants and grafts, initial encounter: Secondary | ICD-10-CM

## 2019-08-14 DIAGNOSIS — Z01810 Encounter for preprocedural cardiovascular examination: Secondary | ICD-10-CM

## 2019-08-14 DIAGNOSIS — R51 Headache: Secondary | ICD-10-CM

## 2019-08-14 NOTE — Progress Notes
Wound Ostomy Note    NAME:Allen Gill                                                                   MRN: [id number]                 DOB:1990-07-22          AGE: 29 y.o.    Reason for Consult/Visit: ostomy clinic    Ostomy Type: urostomy  Location: right lower abdomen  Stoma Description: red, moist, oval, protrudes  # of Stents: NA  Output: yellow urine  Peristomal Skin: peristomal wound located at 9 o'clock  Pouching System Used: 84138    Pt came to clinic today because of a wound near his stoma.  He changes his pouch every 4 days and noticed this wound last week.  He has been wearing an ostomy belt and this wound appears to be related to the pressure from the belt.  He has a wider belt coming on Friday that he will start using instead.  This should eliminate the pressure source.    The wound measures 1.0cm x 1.5cm x 0.2cm, red tissue in the base and scant serosang drainage.  He has a red area at 2 o'clock, the skin is still intact.  I demonstrated how to use hydrofera blue ready over the wound and covering with tegaderm.    We are also going to try Coloplast's belt instead of Hollister's until he gets his new belt on Friday.  The [employer]oplast belt is more rounded than the Hollister one.  I am also going to have him offload the clasp of the belt with an ABD pad.  He verbalized understanding.    Ostomy Care:    Suggested Pouching Supplies: [phone], hydrofera blue ready, tegaderm, coloplast belt    1.   Change pouch with any sign of leaking  2.   Clean skin with water only - no soap or wipes  3.   Apply a small piece of hydrofera blue ready (words facing up) to wound base and cover with a piece of tegaderm  4.  Cut new pouch leaving 1/8-1/4 of skin showing between the stoma and pouch  5.  Warm new pouch in palm of hands  6.  Ensure skin is dry and apply new pouch  7.  Lay warm hand over pouch once it's applied  8.  Use an abd pad, washcloth or gauze to help offload the clasp from the ostomy belt    Pt is going to send an email to [email or web address] in a couple weeks with an update on his wound to determine if he needs to come in for follow-up.  Once pt's wound appears to be improving, he can use stoma powder and skin prep over the site until it is completely healed.    Lora Havens, RN, BSN, [employer], [employer]  Wound Ostomy Nursing Consult Service  Office number [phone]  [email or web address]

## 2019-08-15 DIAGNOSIS — Z5189 Encounter for other specified aftercare: Secondary | ICD-10-CM

## 2019-08-16 ENCOUNTER — Ambulatory Visit: Admit: 2019-08-16 | Discharge: 2019-08-16 | Payer: MEDICARE

## 2019-08-16 ENCOUNTER — Encounter: Admit: 2019-08-16 | Discharge: 2019-08-16 | Payer: MEDICARE

## 2019-08-16 DIAGNOSIS — T85618A Breakdown (mechanical) of other specified internal prosthetic devices, implants and grafts, initial encounter: Secondary | ICD-10-CM

## 2019-08-16 DIAGNOSIS — G4733 Obstructive sleep apnea (adult) (pediatric): Secondary | ICD-10-CM

## 2019-08-16 DIAGNOSIS — R739 Hyperglycemia, unspecified: Secondary | ICD-10-CM

## 2019-08-16 DIAGNOSIS — Z0000 Encounter for general adult medical examination without abnormal findings: Principal | ICD-10-CM

## 2019-08-16 DIAGNOSIS — G919 Hydrocephalus, unspecified: Secondary | ICD-10-CM

## 2019-08-16 DIAGNOSIS — I429 Cardiomyopathy, unspecified: Secondary | ICD-10-CM

## 2019-08-16 DIAGNOSIS — R339 Retention of urine, unspecified: Secondary | ICD-10-CM

## 2019-08-16 DIAGNOSIS — Q0700 Arnold-Chiari syndrome without spina bifida or hydrocephalus: Secondary | ICD-10-CM

## 2019-08-16 DIAGNOSIS — R51 Headache: Secondary | ICD-10-CM

## 2019-08-16 DIAGNOSIS — Q059 Spina bifida, unspecified: Principal | ICD-10-CM

## 2019-08-16 DIAGNOSIS — F988 Other specified behavioral and emotional disorders with onset usually occurring in childhood and adolescence: Secondary | ICD-10-CM

## 2019-08-16 DIAGNOSIS — R000 Tachycardia, unspecified: Secondary | ICD-10-CM

## 2019-08-16 DIAGNOSIS — N390 Urinary tract infection, site not specified: Secondary | ICD-10-CM

## 2019-08-16 DIAGNOSIS — N200 Calculus of kidney: Secondary | ICD-10-CM

## 2019-08-16 DIAGNOSIS — N319 Neuromuscular dysfunction of bladder, unspecified: Secondary | ICD-10-CM

## 2019-08-16 DIAGNOSIS — Z01810 Encounter for preprocedural cardiovascular examination: Secondary | ICD-10-CM

## 2019-08-16 DIAGNOSIS — R569 Unspecified convulsions: Secondary | ICD-10-CM

## 2019-08-16 DIAGNOSIS — I10 Essential (primary) hypertension: Secondary | ICD-10-CM

## 2019-08-16 NOTE — Patient Instructions
Health Maintenance   Topic Date Due   ? HIV SCREENING  Never done   ? INFLUENZA VACCINE  10/12/2019   ? PHYSICAL (COMPREHENSIVE) EXAM  08/15/2020   ? MEDICARE ANNUAL WELLNESS VISIT  08/15/2020   ? DTAP/TDAP VACCINES (8 - Td) 05/24/2028   ? COVID-19 VACCINE  Completed   ? HEPATITIS C SCREENING  Completed

## 2019-08-17 ENCOUNTER — Encounter: Admit: 2019-08-17 | Discharge: 2019-08-17 | Payer: MEDICARE

## 2019-08-29 ENCOUNTER — Encounter: Admit: 2019-08-29 | Discharge: 2019-08-29 | Payer: MEDICARE

## 2019-08-29 NOTE — Telephone Encounter
Pt's caregiver emailed an updated picture of pt's peristomal wound.  Unfortunately the EMR won't upload the image.  The wound has significantly improved when comparing it to his visit in the clinic a couple weeks ago.  Will continue current plan until it is completely healed.    Lora Havens, RN, BSN, CMSRN, [employer]  Wound Ostomy Nursing Consult Service  Available via [employer] or [employer] M-F [phone]  Office number [phone]  For questions after hours/weekend/holidays page [phone] or Voalte text the [facility] on-call nurse.

## 2019-09-03 ENCOUNTER — Encounter: Admit: 2019-09-03 | Discharge: 2019-09-03 | Payer: MEDICARE

## 2019-09-03 ENCOUNTER — Ambulatory Visit: Admit: 2019-09-03 | Discharge: 2019-09-03 | Payer: MEDICARE

## 2019-09-03 DIAGNOSIS — N390 Urinary tract infection, site not specified: Secondary | ICD-10-CM

## 2019-09-03 DIAGNOSIS — R339 Retention of urine, unspecified: Secondary | ICD-10-CM

## 2019-09-03 DIAGNOSIS — R51 Headache: Secondary | ICD-10-CM

## 2019-09-03 DIAGNOSIS — I429 Cardiomyopathy, unspecified: Secondary | ICD-10-CM

## 2019-09-03 DIAGNOSIS — Q0700 Arnold-Chiari syndrome without spina bifida or hydrocephalus: Secondary | ICD-10-CM

## 2019-09-03 DIAGNOSIS — I428 Other cardiomyopathies: Secondary | ICD-10-CM

## 2019-09-03 DIAGNOSIS — G4733 Obstructive sleep apnea (adult) (pediatric): Secondary | ICD-10-CM

## 2019-09-03 DIAGNOSIS — I5042 Chronic combined systolic (congestive) and diastolic (congestive) heart failure: Secondary | ICD-10-CM

## 2019-09-03 DIAGNOSIS — R569 Unspecified convulsions: Secondary | ICD-10-CM

## 2019-09-03 DIAGNOSIS — G919 Hydrocephalus, unspecified: Secondary | ICD-10-CM

## 2019-09-03 DIAGNOSIS — I10 Essential (primary) hypertension: Secondary | ICD-10-CM

## 2019-09-03 DIAGNOSIS — T85618A Breakdown (mechanical) of other specified internal prosthetic devices, implants and grafts, initial encounter: Secondary | ICD-10-CM

## 2019-09-03 DIAGNOSIS — Z01810 Encounter for preprocedural cardiovascular examination: Secondary | ICD-10-CM

## 2019-09-03 DIAGNOSIS — F988 Other specified behavioral and emotional disorders with onset usually occurring in childhood and adolescence: Secondary | ICD-10-CM

## 2019-09-03 DIAGNOSIS — Q059 Spina bifida, unspecified: Secondary | ICD-10-CM

## 2019-09-03 DIAGNOSIS — R000 Tachycardia, unspecified: Secondary | ICD-10-CM

## 2019-09-03 DIAGNOSIS — N319 Neuromuscular dysfunction of bladder, unspecified: Secondary | ICD-10-CM

## 2019-09-03 DIAGNOSIS — N200 Calculus of kidney: Secondary | ICD-10-CM

## 2019-09-03 NOTE — Progress Notes
Date of Service: 09/03/2019    Allen Gill is a 29 y.o. male.       HPI     Allen Gill was seen today in the heart failure clinic for 3-month follow-up.  He was last seen by Dr. [name] on 02/27/2019 where his weight was noted to be 255 pounds.  No medication changes were made.  Since that visit, he was hospitalized 5/27 to 06/12/2019 for nephrolithiasis, and he also presented to the emergency department on 08/06/2019 with a cough and wound check.  He had developed a wound near his ostomy site where his dressing was.    His past medical history includes spina bifida, hydrocephalus and neurogenic bladder.  He was diagnosed with cardiomyopathy when he was septic in the ICU in 2017, at which time his ejection fraction was 25%.  His last echocardiogram from 02/27/2019 revealed an LVEF of 65% with trace MR and TR.    Today, he presents in the clinic, and acknowledges that he has gained some caloric weight over the last several months.  He acknowledges that he has not been very active, going from his house to his car to his parents house.  He is actively trying to get a job that will help with his activity level, and he is contemplating rejoining the gym.  He denies any lower extremity edema or abdominal bloating, chest pain or palpitations, dizziness or lightheadedness, PND orthopnea.  He reports his appetite is great.  He also reports he is sleeping well.         Vitals:    09/03/19 1545   BP: 128/80   BP Source: Arm, Right Upper   Patient Position: Sitting   Pulse: 95   SpO2: 97%   Weight: 121.4 kg (267 lb 9.6 oz)   Height: 1.626 m (5' 4)   PainSc: Zero     Body mass index is 45.93 kg/m?Marland Kitchen     Past Medical History  Patient Active Problem List    Diagnosis Date Noted   ? Complex care coordination 09/14/2016     Priority: High   ? Visit for wound check 08/07/2019   ? Neurogenic bowel 06/12/2019   ? Nephrolithiasis 06/07/2019   ? Septic shock (HCC) 05/02/2019   ? Hypotension 05/02/2019   ? Acute respiratory failure with hypoxia and hypercapnia (HCC) 05/02/2019   ? Aspiration into respiratory tract 05/02/2019   ? GERD (gastroesophageal reflux disease) 05/02/2019   ? Right nephrolithiasis 05/02/2019   ? Hypokalemia 05/02/2019   ? Hypomagnesemia 05/02/2019   ? Hyperglycemia 05/02/2019   ? Leukocytosis 05/02/2019   ? Fever 05/02/2019   ? Small bowel obstruction (HCC) 11/28/2018   ? Chronic combined systolic (congestive) and diastolic (congestive) heart failure (HCC) 11/24/2018   ? UTI (urinary tract infection) 03/10/2018   ? Pyelonephritis 04/13/2017   ? Neurogenic bladder 12/16/2016   ? 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 Non-ischemic cardiomyopathy (HCC) 09/29/2016   ? Essential hypertension 07/30/2016   ? Urethral stone 07/31/2015     Added automatically from request for surgery [id number]     ? Morbid obesity (HCC) 05/19/2015   ? Seizure disorder (HCC) 05/19/2015   ? Transaminitis 05/19/2015   ? Hydrocephalus (HCC) 04/28/2015   ? Kidney stone 02/14/2012     [place] 12/27/11: The kidneys are normal in size. The right kidney measures 11.0 x 5.5 cm and contains a large calculus in the mid to inferior pelvis, measuring 4.8 cm x  0.6 cm  x 1.5 cm. There is mild pelviectasis which fails to resolve on post void imaging. The left kidney measures 11.7 x 5.4 cm. No renal masses are identified.  02/28/12 CT showed right staghorn calculus and a small left upper pole stone.  04/07/12 right PCNL - stable post op course for 2 days and discharged. Readmitted with hypotension and respiratory distress and acute drop in H/H. No PE and pleural effusions. No signs of bleeding/no perinephric bleeding. Sepsis w/u neg but placed on erapenum. Second look dealyed until 04/21/12 - successful and [facility]caged to [facility]  06/09/12 Recovered fully and now for metabolic w/u               ? 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 Arnold-Chiari malformation (HCC) 11/27/2009   ? ADD (attention deficit disorder) 10/28/2006   ? Spina bifida (HCC) 10/28/2006   ? Unspecified constipation 10/28/2006         Review of Systems   Constitutional: Negative.   HENT: Negative.    Eyes: Negative.    Endocrine: Negative.    Hematologic/Lymphatic: Negative.    Skin: Negative.    Musculoskeletal: Negative.    Gastrointestinal: Negative.    Genitourinary: Negative.    Neurological: Negative.    Psychiatric/Behavioral: Negative.    Allergic/Immunologic: Negative.    All other systems reviewed and are negative.      Physical Exam  General Appearance: no distress  Skin: warm and dry  Digits and Nails: normal color, smooth symmetric nails and digits  Eyes: conjunctivae and lids normal  Neck Veins: JVP difficult to assess due to body habitus  Auscultation/Percussion: breathing comfortably, lungs clear to auscultation, no rales or rhonchi, no wheezing  Cardiac Auscultation: Regular rhythm, S1, S2, no S3 or S4, no murmur  Radial Arteries: normal symmetric radial pulses  Pedal Pulses: pulses 2+, symmetric  Lower Extremity Edema: 1+BLE edema to knees   Abdominal Exam: soft, non-tender,  bowel sounds normal  Gait & Station: bilateral lower extremity braces  Muscle Strength: muscle atrophy in lower extremities  Orientation: clear historian, good insight         Problems Addressed Today  Encounter Diagnoses   Name Primary?   ? Chronic combined systolic (congestive) and diastolic (congestive) heart failure (HCC)  - NYHA class III, stage C symptoms  - GDMT:   BB: Carvedilol 6.25 mg twice daily  ACEI/ARB/ARNI: Losartan 25 mg daily  AA: Spironolactone 25 mg daily  Hydralazine/isordil: NA  - SGLT2: Discussed, but wishes to not start at this time  - Last echo: 02/27/2019: LVEF 65% with no wall motion abnormalities, trace MR and TR  - Device: NA with recovery  - Labs: Labs from 08/07/2019 reviewed  - Follow-up: First available to establish with heart failure physician   Yes   ? Non-ischemic cardiomyopathy (HCC)  -Appears euvolemic on exam, requiring no loop diuretics    ? Essential hypertension  - BP 128/80 on above medications    ? Morbid obesity (HCC)  - encouraged increased physical activity, monitoring caloric intake             Plan:  The following is a copy of the written instructions and plan I gave to the patient during his office visit.    Patient Instructions       Thank you for coming to [facility]. Your instructions today:     1.  Recommendations: no changes in medications today.  It was good to see you.  2.  Next follow up appointment: first available with any of the HF physicians to establish care.    For up to date information on the COVID-19 virus, visit the [facility] website.  ? General supportive care during cold and flu season and infection prevention reminders:   o Wash hands often with soap and water for at least 20 seconds  o Cover your mouth and nose  o Stay home if sick and symptoms mild or manageable  - If you must be around people wear a mask    ? If you are having symptoms of a lower respiratory infection (cough, shortness of breath) and/or fever AND either traveled in last 30 days (internationally or to region of exposure) OR known exposure to patient with COVID19:    o Call your primary care provider for questions or health needs.   - Tell your doctor about your recent travel and your symptoms    o In a medical emergency, call 911 or go to the nearest emergency room.      Renita Papa, MD, MSc  Ivory Broad, APRN  Elayne Snare, RN  [facility]  Phone: [phone]   Fax: [phone]  Scheduling: [phone]    In order to provide you the best care possible we ask that you follow up as below:     For NON-URGENT questions please contact us through your MyChart account.   For all medication refills please contact your pharmacy or send a request through MyChart.   For all questions that may need to be addressed urgently please call the nursing triage line at [phone] Monday - Friday 8 AM-5 PM only. Please leave a detailed message with your name, date of birth, and reason for your call.       To schedule an appointment call [phone].      Please allow 10 business days for the results of any testing to be reviewed. Please call our office if you have not heard from a nurse within this time frame.      Your Heart Failure Symptom Awareness and Action Plan  Every Day Action Plan  ? Weigh yourself in the morning before breakfast. Write it down and compare it to yesterday's weight.  ? Take your medicine, as prescribed. Please call if you have concerns about the side effects, cost or refills.  ? Check for worsened swelling in your feet, ankles and stomach  ? Follow a 2000mg  salt diet.  ? Keep all healthcare appointments    Green Zone   Good! Symptoms are under control ? No shortness of breath  ? No increase in ankle swelling  ? No weight gain  ? No chest pain  ? No change in your usual activity  ? Continue to follow your everyday action plan.    Yellow Zone  If you have any of these symptoms, please call the heart failure nurses: [phone] ? Increased shortness of breath with activity  ? Weight gain of 3 pounds in one day or 5 pounds in a week  ? Increased swelling in your ankles or legs  ? Increased swelling in your stomach  ? Increasing fatigue  ? You may need an adjustment of your medications.    Red Zone  These are urgent symptoms. Please call the heart failure nurses: [phone] ? Shortness of breath at rest or waking up at night feeling short of breath or coughing  ? Increased number of pillows used or  needing to sit upright to sleep  ? Chest tightness at rest  ? Dizziness, lightheadedness or feeling faint You need to schedule an appointment   Emergency Zone ? call 911 ? Worsening chest tightness or pain that is not relieved by medication  ? Severe shortness of breath and a cough with pink, frothy sputum           Future Appointments   Date Time Provider Department Center   10/11/2019  2:45 PM [facility] Radi[facility]   07/02/2020  2:00 PM Maia Petties, MD [id number] Neurology   08/19/2020  1:00 PM Hiram Gash, MD [id number]           Education/counseling time spent: 25 minutes of 35 minute visit  Topics:  HF disease process, Treatment options, Treatment risks and benefits, Medication instructions, Medication interactions, Daily weight monitoring, Sodium restriction, Personalized exercise guidelines,  Knows HF symptoms, Knows when to call, Knows who to call, Knows next appt.           Current Medications (including today's revisions)  ? acetaminophen (TYLENOL) 325 mg tablet Take two tablets by mouth every 6 hours as needed.   ? carvediloL (COREG) 6.25 mg tablet Take one tablet by mouth twice daily with meals.   ? cholecalciferol(+) (VITAMIN D3) 2,000 unit tablet Take one tablet by mouth daily.   ? cyanocobalamin (VITAMIN B-12) 1,000 mcg tablet Take one tablet by mouth daily.   ? fish oil /omega-3 fatty acids (SEA-OMEGA) 340/1000 mg capsule Take 1 capsule by mouth daily.   ? Leg Brace misc Bilateral Fitted leg brace and right shoe for foot drop.  Dx: spina bifida.   ? levETIRAcetam (KEPPRA) 500 mg tablet Take one tablet by mouth twice daily.   ? losartan (COZAAR) 25 mg tablet Take one tablet by mouth daily.   ? multivitamin with iron (MULTIPLE VITAMINS WITH IRON PO) Take 1 tablet by mouth daily.   ? Doctor, general practice 1 piece Urostomy bag.  Use 1 bag as needed.   ? polyethylene glycol 3350 (MIRALAX) 17 g packet Take one packet by mouth twice daily.   ? senna/docusate (SENOKOT-S) 8.6/50 mg tablet Take one tablet by mouth twice daily.   ? spironolactone (ALDACTONE) 25 mg tablet Take one tablet by mouth daily. Take with food.

## 2019-09-13 ENCOUNTER — Other Ambulatory Visit: Payer: Self-pay

## 2019-09-13 ENCOUNTER — Encounter (HOSPITAL_BASED_OUTPATIENT_CLINIC_OR_DEPARTMENT_OTHER): Payer: Self-pay | Admitting: *Deleted

## 2019-09-13 ENCOUNTER — Emergency Department (HOSPITAL_BASED_OUTPATIENT_CLINIC_OR_DEPARTMENT_OTHER)
Admission: EM | Admit: 2019-09-13 | Discharge: 2019-09-13 | Discharge status: Against Medical Advice | Payer: No Typology Code available for payment source | Attending: Emergency Medicine | Admitting: Emergency Medicine

## 2019-09-13 DIAGNOSIS — R109 Unspecified abdominal pain: Secondary | ICD-10-CM | POA: Diagnosis present

## 2019-09-13 DIAGNOSIS — Z5321 Procedure and treatment not carried out due to patient leaving prior to being seen by health care provider: Secondary | ICD-10-CM | POA: Diagnosis not present

## 2019-09-13 NOTE — ED Triage Notes (Signed)
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 He works at [employer].

## 2019-09-13 NOTE — ED Notes (Signed)
Informed registration that he is leaving. LWBS after triage.

## 2019-10-08 ENCOUNTER — Encounter: Admit: 2019-10-08 | Discharge: 2019-10-08 | Payer: MEDICARE

## 2019-10-08 NOTE — Progress Notes
Discussed pt current condition.   Pt reports having no difficulties at this time.   Medications working as expected.  Will call should he need assistance.  Will continue to follow.

## 2019-10-12 ENCOUNTER — Encounter: Admit: 2019-10-12 | Discharge: 2019-10-12 | Payer: MEDICARE

## 2019-10-12 ENCOUNTER — Ambulatory Visit: Admit: 2019-10-12 | Discharge: 2019-10-12 | Payer: MEDICARE

## 2019-10-12 DIAGNOSIS — N200 Calculus of kidney: Secondary | ICD-10-CM

## 2019-10-15 ENCOUNTER — Encounter: Admit: 2019-10-15 | Discharge: 2019-10-15 | Payer: MEDICARE

## 2019-10-18 ENCOUNTER — Encounter: Admit: 2019-10-18 | Discharge: 2019-10-18 | Payer: MEDICARE

## 2019-10-18 DIAGNOSIS — N200 Calculus of kidney: Secondary | ICD-10-CM

## 2019-10-18 NOTE — Telephone Encounter
Called patient to let him know that per Dr. Luther Redo,     . Similar to last ct scan from may. Maybe a small stone in lowe pole right side    Plan is to follow up with a new ct scan in 9 months. Patient verbalized understanding.

## 2019-10-24 ENCOUNTER — Encounter: Admit: 2019-10-24 | Discharge: 2019-10-24 | Payer: MEDICARE

## 2019-10-24 NOTE — Telephone Encounter
Called and spoke with patient. Current FMLA paperwork was filled out along with a current copy of Medication list.Patient advised paperwork is ready for P/U.

## 2019-11-28 ENCOUNTER — Encounter: Admit: 2019-11-28 | Discharge: 2019-11-28 | Payer: MEDICARE

## 2019-11-28 DIAGNOSIS — I428 Other cardiomyopathies: Secondary | ICD-10-CM

## 2019-11-28 DIAGNOSIS — G40209 Localization-related (focal) (partial) symptomatic epilepsy and epileptic syndromes with complex partial seizures, not intractable, without status epilepticus: Secondary | ICD-10-CM

## 2019-11-28 DIAGNOSIS — I10 Essential (primary) hypertension: Secondary | ICD-10-CM

## 2019-11-28 MED ORDER — LEVETIRACETAM 500 MG PO TAB
500 mg | ORAL_TABLET | Freq: Two times a day (BID) | ORAL | 1 refills | 90.00000 days | Status: AC
Start: 2019-11-28 — End: ?

## 2019-11-28 MED ORDER — LOSARTAN 25 MG PO TAB
25 mg | ORAL_TABLET | Freq: Every day | ORAL | 3 refills | 30.00000 days | Status: AC
Start: 2019-11-28 — End: ?

## 2019-11-28 MED ORDER — SPIRONOLACTONE 25 MG PO TAB
25 mg | ORAL_TABLET | Freq: Every day | ORAL | 3 refills | 90.00000 days | Status: AC
Start: 2019-11-28 — End: ?

## 2019-11-28 MED ORDER — CARVEDILOL 6.25 MG PO TAB
6.25 mg | ORAL_TABLET | Freq: Two times a day (BID) | ORAL | 3 refills | 90.00000 days | Status: AC
Start: 2019-11-28 — End: ?

## 2020-01-24 ENCOUNTER — Encounter: Admit: 2020-01-24 | Discharge: 2020-01-24 | Payer: MEDICARE

## 2020-03-06 ENCOUNTER — Encounter: Admit: 2020-03-06 | Discharge: 2020-03-06 | Payer: MEDICARE

## 2020-03-06 ENCOUNTER — Ambulatory Visit: Admit: 2020-03-06 | Discharge: 2020-03-07 | Payer: MEDICARE

## 2020-03-06 DIAGNOSIS — R000 Tachycardia, unspecified: Secondary | ICD-10-CM

## 2020-03-06 DIAGNOSIS — R569 Unspecified convulsions: Secondary | ICD-10-CM

## 2020-03-06 DIAGNOSIS — R51 Headache: Secondary | ICD-10-CM

## 2020-03-06 DIAGNOSIS — Z01810 Encounter for preprocedural cardiovascular examination: Secondary | ICD-10-CM

## 2020-03-06 DIAGNOSIS — N200 Calculus of kidney: Secondary | ICD-10-CM

## 2020-03-06 DIAGNOSIS — G919 Hydrocephalus, unspecified: Secondary | ICD-10-CM

## 2020-03-06 DIAGNOSIS — R339 Retention of urine, unspecified: Secondary | ICD-10-CM

## 2020-03-06 DIAGNOSIS — I5042 Chronic combined systolic (congestive) and diastolic (congestive) heart failure: Secondary | ICD-10-CM

## 2020-03-06 DIAGNOSIS — F988 Other specified behavioral and emotional disorders with onset usually occurring in childhood and adolescence: Secondary | ICD-10-CM

## 2020-03-06 DIAGNOSIS — I428 Other cardiomyopathies: Secondary | ICD-10-CM

## 2020-03-06 DIAGNOSIS — Q059 Spina bifida, unspecified: Secondary | ICD-10-CM

## 2020-03-06 DIAGNOSIS — I429 Cardiomyopathy, unspecified: Secondary | ICD-10-CM

## 2020-03-06 DIAGNOSIS — Q0700 Arnold-Chiari syndrome without spina bifida or hydrocephalus: Secondary | ICD-10-CM

## 2020-03-06 DIAGNOSIS — N390 Urinary tract infection, site not specified: Secondary | ICD-10-CM

## 2020-03-06 DIAGNOSIS — I10 Essential (primary) hypertension: Secondary | ICD-10-CM

## 2020-03-06 DIAGNOSIS — N319 Neuromuscular dysfunction of bladder, unspecified: Secondary | ICD-10-CM

## 2020-03-06 DIAGNOSIS — G4733 Obstructive sleep apnea (adult) (pediatric): Secondary | ICD-10-CM

## 2020-03-06 DIAGNOSIS — T85618A Breakdown (mechanical) of other specified internal prosthetic devices, implants and grafts, initial encounter: Secondary | ICD-10-CM

## 2020-03-06 LAB — BASIC METABOLIC PANEL
Lab: 0.7 mg/dL (ref 0.4–1.24)
Lab: 104 MMOL/L (ref 98–110)
Lab: 11 (ref 3–12)
Lab: 120 mg/dL — ABNORMAL HIGH (ref 70–100)
Lab: 146 MMOL/L (ref 137–147)
Lab: 18 mg/dL (ref 7–25)
Lab: 3.6 MMOL/L (ref 3.5–5.1)
Lab: 31 MMOL/L — ABNORMAL HIGH (ref 21–30)
Lab: 9.9 mg/dL (ref 8.5–10.6)
Lab: >60 mL/min (ref >60)

## 2020-03-06 MED ORDER — FUROSEMIDE 20 MG PO TAB
20 mg | ORAL_TABLET | Freq: Every day | ORAL | 0 refills | 90.00000 days | Status: AC | PRN
Start: 2020-03-06 — End: ?

## 2020-03-07 ENCOUNTER — Encounter: Admit: 2020-03-07 | Discharge: 2020-03-07 | Payer: MEDICARE

## 2020-03-07 DIAGNOSIS — I428 Other cardiomyopathies: Secondary | ICD-10-CM

## 2020-03-07 DIAGNOSIS — I5042 Chronic combined systolic (congestive) and diastolic (congestive) heart failure: Secondary | ICD-10-CM

## 2020-03-07 DIAGNOSIS — I10 Essential (primary) hypertension: Secondary | ICD-10-CM

## 2020-03-07 MED ORDER — SPIRONOLACTONE 25 MG PO TAB
25 mg | ORAL_TABLET | Freq: Two times a day (BID) | ORAL | 3 refills | 46.00000 days | Status: AC
Start: 2020-03-07 — End: ?

## 2020-03-07 NOTE — Telephone Encounter
Connected with pt. Informed of Ivory Broad, APRN's recommendations.  Patient verbalized understanding and does not have any further questions or concerns.  Pt states he will come in to the lab on either 3/4 or 3/7.

## 2020-03-07 NOTE — Telephone Encounter
-----   Message from Leonides Sake, APRN-NP sent at 03/07/2020 12:27 PM CST -----  Please have him increase his spironolactone to 25mg  po BID and recheck a BMP in 7-10 days.  KB

## 2020-03-14 ENCOUNTER — Encounter: Admit: 2020-03-14 | Discharge: 2020-03-14 | Payer: MEDICARE

## 2020-03-14 DIAGNOSIS — I428 Other cardiomyopathies: Secondary | ICD-10-CM

## 2020-03-14 DIAGNOSIS — I10 Essential (primary) hypertension: Secondary | ICD-10-CM

## 2020-03-14 LAB — BASIC METABOLIC PANEL
Lab: 142 MMOL/L (ref 137–147)
Lab: 3.4 MMOL/L — ABNORMAL LOW (ref 3.5–5.1)

## 2020-03-17 ENCOUNTER — Encounter: Admit: 2020-03-17 | Discharge: 2020-03-17 | Payer: MEDICARE

## 2020-03-17 DIAGNOSIS — I428 Other cardiomyopathies: Secondary | ICD-10-CM

## 2020-03-17 DIAGNOSIS — I10 Essential (primary) hypertension: Secondary | ICD-10-CM

## 2020-03-17 MED ORDER — SPIRONOLACTONE 25 MG PO TAB
50 mg | ORAL_TABLET | Freq: Every day | ORAL | 3 refills | 46.00000 days | Status: AC
Start: 2020-03-17 — End: ?

## 2020-04-01 ENCOUNTER — Encounter: Admit: 2020-04-01 | Discharge: 2020-04-01 | Payer: MEDICARE

## 2020-04-01 NOTE — Progress Notes
Attempted to call patient for CCM. Left vm message with contact info for callback. Will follow up at at a later time.

## 2020-04-14 ENCOUNTER — Encounter: Admit: 2020-04-14 | Discharge: 2020-04-14 | Payer: MEDICARE

## 2020-04-14 NOTE — Telephone Encounter
Patient requesting a new order sent to [facility] for foot. LM for pt to return call for additional details.

## 2020-05-22 ENCOUNTER — Encounter: Admit: 2020-05-22 | Discharge: 2020-05-22 | Payer: MEDICARE

## 2020-05-22 NOTE — Progress Notes
CARE MANAGEMENT OUTREACH    Spoke with patient. Introduced self as CCM. Provided contact information. Patient states currently "doing fine." No needs noted at this time. Pt states that he may need A1c ordered for his insurance [facility]) but will find out for sure and call back if needed. Encouraged patient to call anytime with questions or for assistance. Verbalized understanding.

## 2020-05-27 ENCOUNTER — Encounter: Admit: 2020-05-27 | Discharge: 2020-05-27 | Payer: MEDICARE

## 2020-05-27 DIAGNOSIS — I428 Other cardiomyopathies: Secondary | ICD-10-CM

## 2020-05-27 DIAGNOSIS — I10 Essential (primary) hypertension: Secondary | ICD-10-CM

## 2020-05-27 MED ORDER — LOSARTAN 25 MG PO TAB
25 mg | ORAL_TABLET | Freq: Every day | ORAL | 3 refills | Status: CN
Start: 2020-05-27 — End: ?

## 2020-05-27 MED ORDER — SPIRONOLACTONE 25 MG PO TAB
50 mg | ORAL_TABLET | Freq: Every day | ORAL | 0 refills | 90.00000 days | Status: AC
Start: 2020-05-27 — End: ?

## 2020-05-27 MED ORDER — LOSARTAN 25 MG PO TAB
25 mg | ORAL_TABLET | Freq: Every day | ORAL | 0 refills | 30.00000 days | Status: AC
Start: 2020-05-27 — End: ?

## 2020-05-27 NOTE — Telephone Encounter
Refill request received for Losartan 25 mg. Last OV with Ivory Broad, APRN 03/06/20. Last chemistry 03/14/20 K+ 3.4. APP increased Spironolactone to 50 mg daily. Pt was to repeat BMP on 03/27/20. Next OV with Dr. Pierre Bali 06/12/20. Call placed to pt and confirmed he did increase Spironolactone to 50 mg daily. Requested he have repeat labs as soon as possible. Pt agreeable to having labs drawn at [facility]. Provided pt the address and hours of operations. Pt states he will talk to his case worker and will try to get lab drawn by 05/29/20. Informed pt we will fax lab orders to [facility] and send in courtesy refills on Spironolactone and Losartan. Pt also confirmed he is currently using [facility]. Lab order faxed to [facility] in [place]. Updated med profile and preferred pharmacies. IB scheduled to f/u on lab results.

## 2020-06-06 ENCOUNTER — Encounter: Admit: 2020-06-06 | Discharge: 2020-06-06 | Payer: MEDICARE

## 2020-06-06 MED ORDER — FUROSEMIDE 20 MG PO TAB
ORAL_TABLET | Freq: Every day | ORAL | 0 refills | 90.00000 days | Status: AC | PRN
Start: 2020-06-06 — End: ?

## 2020-06-11 ENCOUNTER — Encounter: Admit: 2020-06-11 | Discharge: 2020-06-11 | Payer: MEDICARE

## 2020-06-11 DIAGNOSIS — I10 Essential (primary) hypertension: Secondary | ICD-10-CM

## 2020-06-11 DIAGNOSIS — I428 Other cardiomyopathies: Secondary | ICD-10-CM

## 2020-06-11 MED ORDER — LOSARTAN 25 MG PO TAB
ORAL_TABLET | Freq: Every day | ORAL | 3 refills | 90.00000 days | Status: AC
Start: 2020-06-11 — End: ?

## 2020-06-12 ENCOUNTER — Encounter: Admit: 2020-06-12 | Discharge: 2020-06-12 | Payer: MEDICARE

## 2020-06-12 ENCOUNTER — Ambulatory Visit: Admit: 2020-06-12 | Discharge: 2020-06-12 | Payer: MEDICARE

## 2020-06-12 DIAGNOSIS — F988 Other specified behavioral and emotional disorders with onset usually occurring in childhood and adolescence: Secondary | ICD-10-CM

## 2020-06-12 DIAGNOSIS — N319 Neuromuscular dysfunction of bladder, unspecified: Secondary | ICD-10-CM

## 2020-06-12 DIAGNOSIS — N390 Urinary tract infection, site not specified: Secondary | ICD-10-CM

## 2020-06-12 DIAGNOSIS — Z01810 Encounter for preprocedural cardiovascular examination: Secondary | ICD-10-CM

## 2020-06-12 DIAGNOSIS — G4733 Obstructive sleep apnea (adult) (pediatric): Secondary | ICD-10-CM

## 2020-06-12 DIAGNOSIS — I5042 Chronic combined systolic (congestive) and diastolic (congestive) heart failure: Secondary | ICD-10-CM

## 2020-06-12 DIAGNOSIS — R51 Headache: Secondary | ICD-10-CM

## 2020-06-12 DIAGNOSIS — R569 Unspecified convulsions: Secondary | ICD-10-CM

## 2020-06-12 DIAGNOSIS — I10 Essential (primary) hypertension: Secondary | ICD-10-CM

## 2020-06-12 DIAGNOSIS — R339 Retention of urine, unspecified: Secondary | ICD-10-CM

## 2020-06-12 DIAGNOSIS — I429 Cardiomyopathy, unspecified: Secondary | ICD-10-CM

## 2020-06-12 DIAGNOSIS — R739 Hyperglycemia, unspecified: Secondary | ICD-10-CM

## 2020-06-12 DIAGNOSIS — R000 Tachycardia, unspecified: Secondary | ICD-10-CM

## 2020-06-12 DIAGNOSIS — G919 Hydrocephalus, unspecified: Secondary | ICD-10-CM

## 2020-06-12 DIAGNOSIS — N200 Calculus of kidney: Secondary | ICD-10-CM

## 2020-06-12 DIAGNOSIS — Q059 Spina bifida, unspecified: Secondary | ICD-10-CM

## 2020-06-12 DIAGNOSIS — Q0700 Arnold-Chiari syndrome without spina bifida or hydrocephalus: Secondary | ICD-10-CM

## 2020-06-12 DIAGNOSIS — T85618A Breakdown (mechanical) of other specified internal prosthetic devices, implants and grafts, initial encounter: Secondary | ICD-10-CM

## 2020-06-12 MED ORDER — CARVEDILOL 12.5 MG PO TAB
12.5 mg | ORAL_TABLET | Freq: Two times a day (BID) | ORAL | 1 refills | 90.00000 days | Status: AC
Start: 2020-06-12 — End: ?

## 2020-07-04 ENCOUNTER — Encounter: Admit: 2020-07-04 | Discharge: 2020-07-04 | Payer: MEDICARE

## 2020-07-04 MED ORDER — CARVEDILOL 6.25 MG PO TAB
6.25 mg | ORAL_TABLET | Freq: Two times a day (BID) | ORAL | 1 refills | 90.00000 days | Status: AC
Start: 2020-07-04 — End: ?

## 2020-07-06 ENCOUNTER — Encounter: Admit: 2020-07-06 | Discharge: 2020-07-06 | Payer: MEDICARE

## 2020-07-06 ENCOUNTER — Emergency Department: Admit: 2020-07-06 | Discharge: 2020-07-06 | Payer: MEDICARE

## 2020-07-06 DIAGNOSIS — R519 Acute nonintractable headache, unspecified headache type: Secondary | ICD-10-CM

## 2020-07-06 LAB — CBC AND DIFF
ABSOLUTE BASO COUNT: 0 K/UL (ref 0–0.20)
ABSOLUTE MONO COUNT: 0.4 K/UL (ref 0–0.80)
ABSOLUTE NEUTROPHIL: 6.1 K/UL (ref 1.8–7.0)
BASOPHILS %: 0 % — ABNORMAL HIGH (ref 0–2)
MCH: 28 pg (ref 26–34)
MDW (MONOCYTE DISTRIBUTION WIDTH): 18 (ref <20.7)
WBC COUNT: 8.3 K/UL (ref 4.5–11.0)

## 2020-07-06 LAB — COMPREHENSIVE METABOLIC PANEL
ALBUMIN: 4.2 g/dL (ref 3.5–5.0)
ALK PHOSPHATASE: 83 U/L — ABNORMAL LOW (ref 25–110)
AST: 49 U/L — ABNORMAL HIGH (ref 7–40)
CALCIUM: 9.4 mg/dL (ref 8.5–10.6)
CHLORIDE: 97 MMOL/L — ABNORMAL LOW (ref 98–110)
CO2: 26 MMOL/L (ref 21–30)
CREATININE: 0.7 mg/dL (ref 0.4–1.24)
EGFR: >60 mL/min (ref >60)
GLUCOSE,PANEL: 182 mg/dL — ABNORMAL HIGH (ref 70–100)
POTASSIUM: 4 MMOL/L (ref 3.5–5.1)
SODIUM: 134 MMOL/L — ABNORMAL LOW (ref 137–147)
TOTAL BILIRUBIN: 0.5 mg/dL (ref 0.3–1.2)
TOTAL PROTEIN: 7.4 g/dL (ref 6.0–8.0)

## 2020-07-06 LAB — POC TROPONIN: TROPONIN I, POC: 0 ng/mL (ref 0.00–0.05)

## 2020-07-06 LAB — MAGNESIUM: MAGNESIUM: 1.8 mg/dL (ref 1.6–2.6)

## 2020-07-06 MED ORDER — PROCHLORPERAZINE EDISYLATE 5 MG/ML IJ SOLN
10 mg | Freq: Once | INTRAVENOUS | 0 refills | Status: CP
Start: 2020-07-06 — End: ?
  Administered 2020-07-06: 21:00:00 10 mg via INTRAVENOUS

## 2020-07-06 MED ORDER — LACTATED RINGERS IV SOLP
1000 mL | INTRAVENOUS | 0 refills | Status: CP
Start: 2020-07-06 — End: ?
  Administered 2020-07-06: 20:00:00 1000 mL via INTRAVENOUS

## 2020-07-06 MED ORDER — ACETAMINOPHEN 500 MG PO TAB
1000 mg | Freq: Once | ORAL | 0 refills | Status: CP
Start: 2020-07-06 — End: ?
  Administered 2020-07-06: 21:00:00 1000 mg via ORAL

## 2020-07-06 NOTE — ED Provider Notes
Allen Gill is a 30 y.o. male.    Chief Complaint:  Chief Complaint   Patient presents with   ? Headache       History of Present Illness:  Patient is a 30 y.o. male w/ PMHx pertinent for Arnold-chiari malformation s/p VP shunt x2, DM, seizures, myelomeningocele, HTN, fundoplication. Patient presents for CC of headache and light-headedness. Patient reports symptoms have been present for 6 days, and is associated with nausea. Symptoms are improved by zofran. Denies chest pain, SOB, loss of consciousness, fever, vomiting, abdominal pain and dysuria. Reports last Tuesday, tried a higher dose of his carvedilol, and he thought it made him feel ill, so he backed down to 6.25 mg the next day. Reports symptoms had been improving, but this morning, he awoke with acutely worsened symptoms, and was worried it was due to his shunts. Patient has no other acute complaints at this time.             Review of Systems:  Review of Systems   Constitutional: Negative for diaphoresis and fever.   HENT: Negative for sore throat and trouble swallowing.    Eyes: Negative for pain and visual disturbance.   Respiratory: Negative for chest tightness and shortness of breath.    Cardiovascular: Negative for chest pain and palpitations.   Gastrointestinal: Positive for nausea. Negative for abdominal pain, diarrhea and vomiting.   Genitourinary: Negative for difficulty urinating, dysuria and hematuria.   Musculoskeletal: Negative for back pain and neck pain.   Skin: Negative for pallor and rash.   Neurological: Positive for dizziness, light-headedness and headaches. Negative for syncope.       Allergies:  Latex, Amoxicillin, Ceclor [cefaclor], Clindamycin, and Zosyn [piperacillin-tazobactam]    Past Medical History:  Medical History:   Diagnosis Date   ? 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 Arnold-Chiari malformation (HCC)    ? Cardiomyopathy (HCC)    ? Headache(784.0)    ? Hydrocephalus (HCC)    ? Hypertension ? Infection of VP shunt    ? Kidney stones     frequent   ? Myelomeningocele (HCC)    ? Neurogenic bladder    ? OSA (obstructive sleep apnea)    ? Preop cardiovascular exam 07/30/2016   ? Seizures (HCC)     blank staring spells in childhood - last absentee seizure 2012   ? Shunt malfunction    ? Spina bifida (HCC)    ? Tachycardia 07/30/2016   ? Urinary retention 02/19/2016    Added automatically from request for surgery [id number]   ? Urinary tract infection     frequent       Past Surgical History:  Surgical History:   Procedure Laterality Date   ? SHUNT CREATION VENTRICULAR PERITONEAL Right 06/14/2014    Performed by Angelia Mould, MD at [facility] OR   ? SHUNT REVISION VENTRICULAR PERITONEAL Right 04/23/2015    Performed by Angelia Mould, MD at [facility] OR   ? REMOVAL HARDWARE HEAD: removal of left ventriculopleural shunt Left 05/20/2015    Performed by Angelia Mould, MD at [facility] OR   ? SHUNT REMOVAL VENTRICULAR PERITONEAL Right 05/23/2015    Performed by Angelia Mould, MD at [facility] OR   ? SHUNT CREATION VENTRICULAR PERITONEAL Left 06/03/2015    Performed by Angelia Mould, MD at [facility] OR   ? CYSTOURETHROSCOPY, CYSTOLITHOLAPAXY N/A 08/05/2015    Performed by Pennie Banter, MD at [facility] OR   ? HOLMIUM LASER ENUCLEATION  OF PROSTATE (NO MORCELLATION) N/A 08/19/2015    Performed by Vonna Drafts, MD at [facility] OR   ? CYSTOSCOPY N/A 08/19/2015    Performed by Vonna Drafts, MD at [facility] OR   ? CYSTOSCOPY EVACUATION CLOTS N/A 08/29/2015    Performed by Vonna Drafts, MD at [facility] OR   ? CYSTOSCOPY, URETHERAL DILATION N/A 02/20/2016    Performed by Vonna Drafts, MD at [facility] OR   ? CYSTECTOMY, ILEAL CONDUIT N/A 10/13/2016    Performed by Glennie Isle, MD at [facility] OR   ? REVISION SHUNT - VENTRICULO-PERITONEAL Left 12/16/2016    Performed by Storm Frisk, MD at [facility] OR   ? CREATION SHUNT - VENTRICULO-PERITONEAL: Left side. 2 hours, proximal catheter and valve in place. will need distal catheter placed, Dr. Joan Flores to complete. Supine Left 12/22/2016    Performed by Angelia Mould, MD at [facility] OR   ? PR REPRGRMG PROGRAMMABLE CEREBROSPINAL SHUNT  01/07/2017   ? PERCUTANEOUS NEPHROSTOLITHOTOMY/ PYELOSTOLITHOTOMY - GREATER THAN 2 CM Left 05/25/2017    Performed by Clarita Crane, MD, [facility] OR   ? CREATION SHUNT - VENTRICULO-PERITONEAL Right 08/04/2017    Performed by Angelia Mould, MD at [facility] OR   ? PERCUTANEOUS NEPHROSTOLITHOTOMY/ PYELOSTOLITHOTOMY - 2 CM OR LESS Right 06/07/2019    Performed by Beckie Busing, MD at [facility] OR   ? CYSTOURETHROSCOPY WITH URETEROSCOPY AND/ OR PYELOSCOPY - WITH REMOVAL/ MANIPULATION CALCULUS Right 06/07/2019    Performed by Beckie Busing, MD at [facility] OR   ? CATHETER IMPLANT/REVISION  12/27/09    distal end of the catheter was revised   ? CATHETER IMPLANT/REVISION  01/31/10    replacement of ventricular catheter with [employer] framelessstereotaxis catheter   ? HX ABDOMEN SURGERY      fundoplication   ? HX ABDOMEN SURGERY  01/2001    Cecostomy   ? HX BACK SURGERY      repair of spina bifida   ? HX BRAIN SURGERY  5 months old    Chiari decompression   ? HX EAR TUBES     ? HX HERNIA REPAIR      inguinal hernia   ? HX SURGERY  at 2 weeks    VP Shunt   ? HX TONSILLECTOMY     ? HX TRACHEOSTOMY     ? SHUNT REVISION  3 months old   ? SHUNT REVISION  30 years old   ? SHUNT REVISION  October 2010    [place]   ? SHUNT REVISION  12/11/09    replacment of valve to acodman hakem adjustablevalve   ? SHUNT REVISION  02/17/10    left frontal ventriculopleural shunt   ? VENTRICULOSTOMY  02/03/10    removal of all shunt components and placementofright frontal ventriculostomy       Pertinent medical/surgical history reviewed    Social History:  Social History     Tobacco Use   ? Smoking status: Never Smoker   ? Smokeless tobacco: Never Used   Vaping Use   ? Vaping Use: Never used   Substance Use Topics   ? Alcohol use: No   ? Drug use: No     Social History     Substance and Sexual Activity   Drug Use No             Family History:  Family History   Problem Relation Age of Onset   ? Diabetes Father    ? Hypertension Father    ?  Other Father         glaucoma   ? High Cholesterol Maternal Grandfather        Vitals:  ED Vitals    Date and Time T BP P RR SPO2P SPO2 User   07/06/20 1805 -- 131/81 -- -- 103 86 % NW   07/06/20 1630 -- 138/81 112 18 PER MINUTE 112 94 % EH   07/06/20 1600 -- 148/93 101 19 PER MINUTE 101 91 % [facility]   07/06/20 1530 -- 154/79 101 16 PER MINUTE 102 93 % EH   07/06/20 1330 -- 158/103 104 16 PER MINUTE 105 92 % NW          Physical Exam:  Physical Exam  Vitals and nursing note reviewed.   Constitutional:       General: He is not in acute distress.     Appearance: He is not ill-appearing.   HENT:      Head: Normocephalic and atraumatic.      Mouth/Throat:      Mouth: Mucous membranes are moist.      Pharynx: Oropharynx is clear.   Eyes:      Extraocular Movements: Extraocular movements intact.      Pupils: Pupils are equal, round, and reactive to light.   Cardiovascular:      Rate and Rhythm: Normal rate and regular rhythm.      Pulses: Normal pulses.      Heart sounds: Normal heart sounds.   Pulmonary:      Effort: Pulmonary effort is normal.      Breath sounds: Normal breath sounds.   Abdominal:      General: There is no distension.      Palpations: Abdomen is soft.      Tenderness: There is no abdominal tenderness.   Musculoskeletal:         General: No deformity. Normal range of motion.   Skin:     General: Skin is warm and dry.      Capillary Refill: Capillary refill takes less than 2 seconds.   Neurological:      General: No focal deficit present.      Mental Status: He is alert and oriented to person, place, and time.      Sensory: No sensory deficit.      Motor: No weakness.      Comments: Proprioception normal             Laboratory Results:  Labs Reviewed   CBC AND DIFF - Abnormal       Result Value Ref Range Status    White Blood Cells 8.3  4.5 - 11.0 K/UL Final    RBC 5.39  4.4 - 5.5 M/UL Final    Hemoglobin 15.1  13.5 - 16.5 GM/DL Final    Hematocrit 16.1 40 - 50 % Final    MCV 82.2  80 - 100 FL Final    MCH 28.1  26 - 34 PG Final    MCHC 34.1  32.0 - 36.0 G/DL Final    RDW 09.6  11 - 15 % Final    Platelet Count 357  150 - 400 K/UL Final    MPV 8.1  7 - 11 FL Final    Neutrophils 74  41 - 77 % Final    Lymphocytes 19 (*) 24 - 44 % Final    Monocytes 6  4 - 12 % Final    Eosinophils 1  0 - 5 % Final  Basophils 0  0 - 2 % Final    Absolute Neutrophil Count 6.12  1.8 - 7.0 K/UL Final    Absolute Lymph Count 1.53  1.0 - 4.8 K/UL Final    Absolute Monocyte Count 0.47  0 - 0.80 K/UL Final    Absolute Eosinophil Count 0.10  0 - 0.45 K/UL Final    Absolute Basophil Count 0.04  0 - 0.20 K/UL Final    MDW (Monocyte Distribution Width) 18.8  <20.7 Final   COMPREHENSIVE METABOLIC PANEL - Abnormal    Sodium 134 (*) 137 - 147 MMOL/L Final    Potassium 4.0  3.5 - 5.1 MMOL/L Final    Chloride 97 (*) 98 - 110 MMOL/L Final    Glucose 182 (*) 70 - 100 MG/DL Final    Blood Urea Nitrogen 13  7 - 25 MG/DL Final    Creatinine 1.61  0.4 - 1.24 MG/DL Final    Calcium 9.4  8.5 - 10.6 MG/DL Final    Total Protein 7.4  6.0 - 8.0 G/DL Final    Total Bilirubin 0.5  0.3 - 1.2 MG/DL Final    Albumin 4.2  3.5 - 5.0 G/DL Final    Alk Phosphatase 83  25 - 110 U/L Final    AST (SGOT) 49 (*) 7 - 40 U/L Final    CO2 26  21 - 30 MMOL/L Final    ALT (SGPT) 63 (*) 7 - 56 U/L Final    Anion Gap 11  3 - 12 Final    eGFR >60  >60 mL/min Final   POC TROPONIN    Troponin-I-POC 0.00  0.00 - 0.05 NG/ML Final   MAGNESIUM    Magnesium 1.8  1.6 - 2.6 mg/dL Final          Radiology Interpretation:    ABDOMEN AP & LAT   Final Result   Findings/Impression:      1.  Bilateral VP shunt catheters are in place. Visualized catheter components are intact without evidence of kink or fracture. Additional abandoned calcified shunt catheter overlies the right central lower chest wall and terminates over the epigastrium.   2.  Nonobstructive bowel gas pattern.   3.  Lumbosacral spinal dysraphism. Mild leftward lumbar curvature. Finalized by Jason Coop, M.D. on 07/06/2020 3:06 PM. Dictated by Jason Coop, M.D. on 07/06/2020 3:02 PM.         CHEST 2 VIEWS   Final Result         1.  Low lung volumes with mild bibasilar atelectasis.   2.  Ventriculoperitoneal shunt catheters as described.          Finalized by Laqueta Due, M.D. on 07/06/2020 3:49 PM. Dictated by Laqueta Due, M.D. on 07/06/2020 3:46 PM.         SKULL LIMITED < 4 VIEWS   Final Result   Findings/Impression:      1.  Bilateral frontal approach VP shunt catheters remain in place. The shunt valve settings appear unchanged with one at position 5 and one at position 6. Visualized components are intact without evidence of catheter kink or fracture.   2.  Partially visualized abandoned calcified right chest wall VP shunt catheter.   3.  Limited evaluation of the C-spine due to obliquity. Continued straightening of cervical lordosis which may be positional. No acute osseous abnormality identified.   4.  Visualized lung apices are clear.          Finalized by Jason Coop, M.D. on  07/06/2020 3:13 PM. Dictated by Jason Coop, M.D. on 07/06/2020 3:06 PM.         C SPINE 3 VIEWS OR LESS   Final Result   Findings/Impression:      1.  Bilateral frontal approach VP shunt catheters remain in place. The shunt valve settings appear unchanged with one at position 5 and one at position 6. Visualized components are intact without evidence of catheter kink or fracture.   2.  Partially visualized abandoned calcified right chest wall VP shunt catheter.   3.  Limited evaluation of the C-spine due to obliquity. Continued straightening of cervical lordosis which may be positional. No acute osseous abnormality identified.   4.  Visualized lung apices are clear.          Finalized by Jason Coop, M.D. on 07/06/2020 3:13 PM. Dictated by Jason Coop, M.D. on 07/06/2020 3:06 PM.         CT HEAD WO CONTRAST   Final Result            EKG:    Rate: 102  Rhythm: Sinus  QTc: 425  Interpretation:  Sinus tachycardia, Regular Rhythm, Left Axis Deviation, LAFB and No evidence of acute ST elevations or depressions      ED Course:  Patient presents for headache and dizziness. Vital signs notable for mld htn, tachycarida. Administered IVF.    Labs pertinent for No leukocytosis, negative troponin and creatinine at patient's baseline.    Imaging showed Appropriately sized ventricles, intact shunt system.      On re-evaluation, patient reports continued headache administered compazine. On re-evaluation after compazine, patient reports resolution of headache. Patient road tested and ambulated well without assistance. Had mild desats in accordance with his history of obesity hypoventilation syndrome, which quickly corrected themselves.    Based on patient condition and laboratory/imaging findings as listed above, patient appropriate for discharge. Patient was updated on this plan of care and is in agreement. Discussed the need for close f/u for re-evaluation and further care, and patient expresses understanding. Patient was discharged in stable condition.            ED Scoring:                                Coding    Facility Administered Meds:  Medications   lactated ringers infusion (0 mL Intravenous Infusion Stopped 07/06/20 1545)   prochlorperazine (COMPAZINE) injection 10 mg (10 mg Intravenous Given 07/06/20 1610)   acetaminophen (TYLENOL EXTRA STRENGTH) tablet 1,000 mg (1,000 mg Oral Given 07/06/20 1610)         Clinical Impression:  Clinical Impression   Acute nonintractable headache, unspecified headache type       Disposition/Follow up  ED Disposition     ED Disposition    Discharge        Hiram Gash, MD  [address]  [place] [address]  [phone]    Schedule an appointment as soon as possible for a visit   As needed    Emergency Department: [facility]  [address]  [address]  [place] [address]  [phone]  Go to   If symptoms worsen      Medications:  Discharge Medication List as of 07/06/2020  5:59 PM          Procedure Notes:  Procedures      Attestation / Supervision:  Madelin Headings MD   Department of Emergency Medicine   [place] preferred - Callback [phone]      Lujean Amel, MD

## 2020-07-06 NOTE — ED Notes
30yo male to ED31 CC headache, hx of hydrocephalus/ shunt placement. Headache started a week ago, became severe today as well as nausea. Pt sates it is similar symptoms to when he has had shunt problems in the past. Pt A&Ox4, breathing even, non labored on RA. Skin race appropriate, warm an dry. Pt on cardiac monitor, VSS. Bed in lowest position, locked, call light within reach.     Medical History:   Diagnosis Date    ADD (attention deficit disorder)     without hyperactivity    Arnold-Chiari malformation (HCC)     Cardiomyopathy (HCC)     Headache(784.0)     Hydrocephalus (HCC)     Hypertension     Infection of VP shunt     Kidney stones     frequent    Myelomeningocele (HCC)     Neurogenic bladder     OSA (obstructive sleep apnea)     Preop cardiovascular exam 07/30/2016    Seizures (HCC)     blank staring spells in childhood - last absentee seizure 2012    Shunt malfunction     Spina bifida (HCC)     Tachycardia 07/30/2016    Urinary retention 02/19/2016    Added automatically from request for surgery [id number]    Urinary tract infection     frequent

## 2020-07-07 ENCOUNTER — Encounter: Admit: 2020-07-07 | Discharge: 2020-07-07 | Payer: MEDICARE

## 2020-07-07 DIAGNOSIS — G919 Hydrocephalus, unspecified: Secondary | ICD-10-CM

## 2020-07-07 DIAGNOSIS — I10 Essential (primary) hypertension: Secondary | ICD-10-CM

## 2020-07-07 DIAGNOSIS — Z01810 Encounter for preprocedural cardiovascular examination: Secondary | ICD-10-CM

## 2020-07-07 DIAGNOSIS — R51 Headache: Secondary | ICD-10-CM

## 2020-07-07 DIAGNOSIS — R569 Unspecified convulsions: Secondary | ICD-10-CM

## 2020-07-07 DIAGNOSIS — N390 Urinary tract infection, site not specified: Secondary | ICD-10-CM

## 2020-07-07 DIAGNOSIS — N319 Neuromuscular dysfunction of bladder, unspecified: Secondary | ICD-10-CM

## 2020-07-07 DIAGNOSIS — I429 Cardiomyopathy, unspecified: Secondary | ICD-10-CM

## 2020-07-07 DIAGNOSIS — Q059 Spina bifida, unspecified: Principal | ICD-10-CM

## 2020-07-07 DIAGNOSIS — R000 Tachycardia, unspecified: Secondary | ICD-10-CM

## 2020-07-07 DIAGNOSIS — R339 Retention of urine, unspecified: Secondary | ICD-10-CM

## 2020-07-07 DIAGNOSIS — T85618A Breakdown (mechanical) of other specified internal prosthetic devices, implants and grafts, initial encounter: Secondary | ICD-10-CM

## 2020-07-07 DIAGNOSIS — N200 Calculus of kidney: Secondary | ICD-10-CM

## 2020-07-07 DIAGNOSIS — F988 Other specified behavioral and emotional disorders with onset usually occurring in childhood and adolescence: Secondary | ICD-10-CM

## 2020-07-07 DIAGNOSIS — G4733 Obstructive sleep apnea (adult) (pediatric): Secondary | ICD-10-CM

## 2020-07-07 DIAGNOSIS — Q0700 Arnold-Chiari syndrome without spina bifida or hydrocephalus: Secondary | ICD-10-CM

## 2020-07-07 NOTE — Telephone Encounter
Message received from [facility] informing pt is at [facility] and they have not received any lab orders. Faxed BMP,BNP, Mag, FLP, and A1C to [phone]. Call placed to pt's phone and he gave the phone to the [facility] staff. Informed her we have just faxed the orders again. Requested they call us back if the orders are not received.

## 2020-07-08 ENCOUNTER — Encounter: Admit: 2020-07-08 | Discharge: 2020-07-08 | Payer: MEDICARE

## 2020-07-08 NOTE — Telephone Encounter
ED Discharge Follow Up  Reached patient: Yes  Admission Information  Hospital Name : [facility]  ED Admission Date: 07/06/20 ED Discharge Date: 07/06/20 Admission Diagnosis: Headache  Discharge Diagnosis Acute nonintractable headache, unspecified headache type  Hospital Services: Unplanned  Today's call is 2 (calendar) days post discharge    Discharge Instruction Review  Did patient receive and understand discharge instructions? Yes  Are there concerns regarding the patient?s ADL?s ? No    Medication Reconciliation  Changes to pre-ED visit medications? No  Were new prescriptions filled?N/A  Meds reviewed and reconciled? Yes  ? carvediloL (COREG) 6.25 mg tablet Take one tablet by mouth twice daily with meals. Take with food.   ? cholecalciferol(+) (VITAMIN D3) 2,000 unit tablet Take one tablet by mouth daily.   ? cyanocobalamin (VITAMIN B-12) 1,000 mcg tablet Take one tablet by mouth daily.   ? fish oil /omega-3 fatty acids (SEA-OMEGA) 340/1000 mg capsule Take 1 capsule by mouth daily.   ? furosemide (LASIX) 20 mg tablet TAKE 1 TABLET BY MOUTH EVERY DAY AS NEEDED   ? Leg Brace misc Bilateral Fitted leg brace and right shoe for foot drop.  Dx: spina bifida.   ? levETIRAcetam (KEPPRA) 500 mg tablet Take one tablet by mouth twice daily.   ? losartan (COZAAR) 25 mg tablet TAKE 1 TABLET BY MOUTH EVERY DAY   ? Miscellaneous Medical Supply misc Shoe lift for right foot.  Dx: spina bifida   ? multivitamin with iron (MULTIPLE VITAMINS WITH IRON PO) Take 1 tablet by mouth daily.   ? Doctor, general practice 1 piece Urostomy bag.  Use 1 bag as needed.   ? spironolactone (ALDACTONE) 25 mg tablet Take two tablets by mouth daily. Take with food.         Understanding Condition  Having any current symptoms? No; he states he is doing fine today and denies any HA. He has not yet checked his BP, and states he needs to purchase a new BP monitor.  He is scheduled with PCP office 07/15/20. He denies any immediate needs.  Patient understands when to seek additional medical care? Yes  Is patient aware of [facility] locations?Yes  Urgent Care appropriate for this diagnosis? No  Other instructions provided:     Scheduling Follow-up Appointment  Upcoming appointment date and time and with whom scheduled:   Future Appointments   Date Time Provider Department Center   07/23/2020  2:00 PM [facility]   07/23/2020  3:15 PM Beckie Busing, MD [facility] Urology   08/29/2020 11:00 AM Hiram Gash, MD [id number]   09/08/2020  1:45 PM [facility], APRN-NP [id number] Neurology     When was patient?s last PCP visit: 08/16/2019  PCP primary location: [facility]  PCP appointment scheduled? Yes, Date: 07/15/20   Specialist appointment scheduled? No  Both PCP and Specialist appointment scheduled: No  Is assistance with transportation needed? No  MyChart message sent? Active in MyChart. No message sent.     ED Communication   Did Pt call Clinic prior to going to ED? No  Reason patient went to ED: Fear of having a critical medical condition    Allen Gill

## 2020-07-10 ENCOUNTER — Encounter: Admit: 2020-07-10 | Discharge: 2020-07-10 | Payer: MEDICARE

## 2020-07-10 MED ORDER — ROSUVASTATIN 20 MG PO TAB
20 mg | ORAL_TABLET | Freq: Every day | ORAL | 3 refills | 90.00000 days | Status: AC
Start: 2020-07-10 — End: ?

## 2020-07-10 NOTE — Telephone Encounter
Called and spoke with Allen Gill about his recent lab work and elevated lipids. Provided Dr. Darcus Austin recommendation to start crestor. Prescription sent to confirmed pharmacy. He had no questions at this time. He has appt to see PCP on Tuesday. Staff message sent to Dr. Larina Bras as well with Dr. Darcus Austin recommendations.

## 2020-07-10 NOTE — Telephone Encounter
-----   Message from Corlis Leak, BSN sent at 07/10/2020 10:02 AM CDT -----    ----- Message -----  From: Michiel Cowboy, MD  Sent: 07/09/2020   2:34 PM CDT  To: Corlis Leak, BSN    Labs OK except lipids and A1C.  He has PCP visit I think next month.   I would recommend crestor 20 mg nightly to be started and needs full evaluation for diabetes with PCP.     JST    ----- Message -----  From: Corlis Leak, BSN  Sent: 07/09/2020   2:29 PM CDT  To: Michiel Cowboy, MD    Hi, last OV with you 6/2: Increase Coreg to 12.5 mg twice daily for better blood pressure. We asked for labs in 1-2 months. F/u with Diannia Ruder in 4 months, gen cards in 1 year.  Please advise recommendations.    Thanks,, Thomasenia Sales

## 2020-07-15 ENCOUNTER — Encounter: Admit: 2020-07-15 | Discharge: 2020-07-15 | Payer: MEDICARE

## 2020-07-23 ENCOUNTER — Encounter: Admit: 2020-07-23 | Discharge: 2020-07-23 | Payer: MEDICARE

## 2020-07-23 ENCOUNTER — Ambulatory Visit: Admit: 2020-07-23 | Discharge: 2020-07-23 | Payer: MEDICARE

## 2020-07-23 DIAGNOSIS — Q0700 Arnold-Chiari syndrome without spina bifida or hydrocephalus: Secondary | ICD-10-CM

## 2020-07-23 DIAGNOSIS — N200 Calculus of kidney: Secondary | ICD-10-CM

## 2020-07-23 DIAGNOSIS — R000 Tachycardia, unspecified: Secondary | ICD-10-CM

## 2020-07-23 DIAGNOSIS — T85618A Breakdown (mechanical) of other specified internal prosthetic devices, implants and grafts, initial encounter: Secondary | ICD-10-CM

## 2020-07-23 DIAGNOSIS — G919 Hydrocephalus, unspecified: Secondary | ICD-10-CM

## 2020-07-23 DIAGNOSIS — G4733 Obstructive sleep apnea (adult) (pediatric): Secondary | ICD-10-CM

## 2020-07-23 DIAGNOSIS — R51 Headache: Secondary | ICD-10-CM

## 2020-07-23 DIAGNOSIS — N319 Neuromuscular dysfunction of bladder, unspecified: Secondary | ICD-10-CM

## 2020-07-23 DIAGNOSIS — R339 Retention of urine, unspecified: Secondary | ICD-10-CM

## 2020-07-23 DIAGNOSIS — Z01810 Encounter for preprocedural cardiovascular examination: Secondary | ICD-10-CM

## 2020-07-23 DIAGNOSIS — Q059 Spina bifida, unspecified: Principal | ICD-10-CM

## 2020-07-23 DIAGNOSIS — R569 Unspecified convulsions: Secondary | ICD-10-CM

## 2020-07-23 DIAGNOSIS — I10 Essential (primary) hypertension: Secondary | ICD-10-CM

## 2020-07-23 DIAGNOSIS — F988 Other specified behavioral and emotional disorders with onset usually occurring in childhood and adolescence: Secondary | ICD-10-CM

## 2020-07-23 DIAGNOSIS — I429 Cardiomyopathy, unspecified: Secondary | ICD-10-CM

## 2020-07-23 DIAGNOSIS — N390 Urinary tract infection, site not specified: Secondary | ICD-10-CM

## 2020-07-29 ENCOUNTER — Encounter: Admit: 2020-07-29 | Discharge: 2020-07-29 | Payer: MEDICARE

## 2020-07-29 ENCOUNTER — Ambulatory Visit: Admit: 2020-07-29 | Discharge: 2020-07-30 | Payer: MEDICARE

## 2020-07-29 DIAGNOSIS — G4733 Obstructive sleep apnea (adult) (pediatric): Secondary | ICD-10-CM

## 2020-07-29 DIAGNOSIS — R339 Retention of urine, unspecified: Secondary | ICD-10-CM

## 2020-07-29 DIAGNOSIS — Q059 Spina bifida, unspecified: Principal | ICD-10-CM

## 2020-07-29 DIAGNOSIS — R51 Headache: Secondary | ICD-10-CM

## 2020-07-29 DIAGNOSIS — N390 Urinary tract infection, site not specified: Secondary | ICD-10-CM

## 2020-07-29 DIAGNOSIS — Z01810 Encounter for preprocedural cardiovascular examination: Secondary | ICD-10-CM

## 2020-07-29 DIAGNOSIS — R000 Tachycardia, unspecified: Secondary | ICD-10-CM

## 2020-07-29 DIAGNOSIS — G919 Hydrocephalus, unspecified: Secondary | ICD-10-CM

## 2020-07-29 DIAGNOSIS — N319 Neuromuscular dysfunction of bladder, unspecified: Secondary | ICD-10-CM

## 2020-07-29 DIAGNOSIS — I429 Cardiomyopathy, unspecified: Secondary | ICD-10-CM

## 2020-07-29 DIAGNOSIS — I428 Other cardiomyopathies: Secondary | ICD-10-CM

## 2020-07-29 DIAGNOSIS — F988 Other specified behavioral and emotional disorders with onset usually occurring in childhood and adolescence: Secondary | ICD-10-CM

## 2020-07-29 DIAGNOSIS — Q0700 Arnold-Chiari syndrome without spina bifida or hydrocephalus: Secondary | ICD-10-CM

## 2020-07-29 DIAGNOSIS — T85618A Breakdown (mechanical) of other specified internal prosthetic devices, implants and grafts, initial encounter: Secondary | ICD-10-CM

## 2020-07-29 DIAGNOSIS — N200 Calculus of kidney: Secondary | ICD-10-CM

## 2020-07-29 DIAGNOSIS — I10 Essential (primary) hypertension: Secondary | ICD-10-CM

## 2020-07-29 DIAGNOSIS — R569 Unspecified convulsions: Secondary | ICD-10-CM

## 2020-07-29 MED ORDER — METFORMIN 500 MG PO TAB
500 mg | ORAL_TABLET | Freq: Two times a day (BID) | ORAL | 1 refills | Status: AC
Start: 2020-07-29 — End: ?

## 2020-07-29 MED ORDER — LOSARTAN 50 MG PO TAB
50 mg | ORAL_TABLET | Freq: Every day | ORAL | 3 refills | 90.00000 days | Status: AC
Start: 2020-07-29 — End: ?

## 2020-07-29 NOTE — Progress Notes
Chief Complaint   Patient presents with   ? Follow Up     Was in the hospital weeks ago.          Subjective:       History of Present Illness  Allen Gill is a 30 y.o. male.  Patient presents to the clinic for follow-up.  He states he was admitted to the hospital after an adjustment in his carvedilol.  Once this returned to normal, he states he felt much better.  He is hesitant to start any medications.  He states he was given a prescription for Crestor but he has held off on taking it.  Patient states he knows that he has been diagnosed with diabetes.  He had been trying to work with the dietitian but having received a phone call yet.  He has had increased stress with the loss of his father.  Patient is not able to exercise regularly due to his limitations with spina bifida.  He also has paperwork today to be filled out for his annual physical assessment.  He has started therapy which he thinks is helpful.          Review of Systems   Constitutional: Positive for malaise/fatigue. Negative for fever.   Cardiovascular: Negative for chest pain and dyspnea on exertion.   Respiratory: Negative for cough and shortness of breath.    Endocrine: Negative for polydipsia, polyphagia and polyuria.   Musculoskeletal: Positive for joint pain and myalgias.   Gastrointestinal: Negative for abdominal pain.   Genitourinary: Negative for frequency.          Objective:         ? carvediloL (COREG) 6.25 mg tablet Take one tablet by mouth twice daily with meals. Take with food.   ? cholecalciferol(+) (VITAMIN D3) 2,000 unit tablet Take one tablet by mouth daily.   ? cyanocobalamin (VITAMIN B-12) 1,000 mcg tablet Take one tablet by mouth daily.   ? fish oil /omega-3 fatty acids (SEA-OMEGA) 340/1000 mg capsule Take 1 capsule by mouth daily.   ? furosemide (LASIX) 20 mg tablet TAKE 1 TABLET BY MOUTH EVERY DAY AS NEEDED   ? Leg Brace misc Bilateral Fitted leg brace and right shoe for foot drop.  Dx: spina bifida.   ? levETIRAcetam (KEPPRA) 500 mg tablet Take one tablet by mouth twice daily.   ? losartan (COZAAR) 50 mg tablet Take one tablet by mouth daily.   ? metFORMIN (GLUCOPHAGE) 500 mg tablet Take one tablet by mouth twice daily after meals. Start with one tab daily for 2 weeks, then increase to twice daily   ? Miscellaneous Medical Supply misc Shoe lift for right foot.  Dx: spina bifida   ? multivitamin with iron (MULTIPLE VITAMINS WITH IRON PO) Take 1 tablet by mouth daily.   ? Doctor, general practice 1 piece Urostomy bag.  Use 1 bag as needed.   ? rosuvastatin (CRESTOR) 20 mg tablet Take one tablet by mouth daily.   ? spironolactone (ALDACTONE) 25 mg tablet Take two tablets by mouth daily. Take with food.     Vitals:    07/29/20 1126   BP: (!) 144/72   Pulse: 66   Temp: 36.4 ?C (97.6 ?F)   Resp: 20   PainSc: Zero   Weight: 130.9 kg (288 lb 9.6 oz)     Body mass index is 49.54 kg/m?Marland Kitchen       Physical Exam  General: NAD, A& O x3  CV: RRR  no murmur  Pulm: CTA BIL  Psych: Normal mood and affect    Reviewed most recent labs with an A1c greater than 9.  Also reviewed notes from cardiology       Assessment and Plan:  1. Hydrocephalus, unspecified type [facility])  Patient follows with neurosurgery for this.  He has a shunt in place    2. Type 2 diabetes mellitus without complication, without long-term current use of insulin (HCC)  Newly diagnosed and uncontrolled.  Will refer to dietitian.  Start metformin 500 mg and titrate up to twice daily.  If patient tolerates this, we will plan to increase to 1000 mg twice daily.  Recommend weight loss and dietary changes.  Plan to repeat labs in 3 months.  Patient also would benefit from a statin, however he is hesitant to start multiple medications at 1 time.  - AMB REFERRAL TO DIETICIAN  - HEMOGLOBIN A1C; Future  - LIPID PROFILE; Future  - COMPREHENSIVE METABOLIC PANEL; Future  - MICROALB/CR RATIO-URINE RANDOM; Future  - TSH WITH FREE T4 REFLEX; Future    3. Non-ischemic cardiomyopathy [facility])  Patient follows with cardiology.  - losartan (COZAAR) 50 mg tablet; Take one tablet by mouth daily.  Dispense: 90 tablet; Refill: 3    4. Essential hypertension  Uncontrolled.  Increase losartan to 50 mg daily.  - losartan (COZAAR) 50 mg tablet; Take one tablet by mouth daily.  Dispense: 90 tablet; Refill: 3    5. Mixed hyperlipidemia  Patient with elevated LDL and diabetes and hypertension.  Would benefit from a statin.  He is going to hold off for right now because he had a reaction to the last medication.  However, discussed with patient that maybe at our next visit we could consider adding a statin.  - LIPID PROFILE; Future  - COMPREHENSIVE METABOLIC PANEL; Future       Problem   Type 2 Diabetes Mellitus Without Complication, Without Long-Term Current Use of Insulin (Hcc)   Acute Respiratory Failure With Hypoxia and Hypercapnia (Hcc) (Resolved)              Monitored today and my assessment is that the current treatment is appropriate.       At least 50% of visit was spent in counseling and coordination of care.    I reviewed with the patient their current medications and specifically any new medications prescribed at the time of this visit and we reviewed the expected benefits and potential side effects. All questions are answered to the patient's satisfaction.

## 2020-07-30 DIAGNOSIS — E782 Mixed hyperlipidemia: Secondary | ICD-10-CM

## 2020-07-30 DIAGNOSIS — I10 Essential (primary) hypertension: Secondary | ICD-10-CM

## 2020-07-30 DIAGNOSIS — E119 Type 2 diabetes mellitus without complications: Secondary | ICD-10-CM

## 2020-08-01 ENCOUNTER — Encounter: Admit: 2020-08-01 | Discharge: 2020-08-01 | Payer: MEDICARE

## 2020-08-01 DIAGNOSIS — G40209 Localization-related (focal) (partial) symptomatic epilepsy and epileptic syndromes with complex partial seizures, not intractable, without status epilepticus: Secondary | ICD-10-CM

## 2020-08-01 MED ORDER — LEVETIRACETAM 500 MG PO TAB
500 mg | ORAL_TABLET | Freq: Two times a day (BID) | ORAL | 0 refills | 90.00000 days | Status: AC
Start: 2020-08-01 — End: ?

## 2020-08-01 NOTE — Telephone Encounter
Pt last saw provider on 06/27/19. Pt is scheduled for follow up appointment on 09/08/20. Per clin note continue keppra 500 twice daily.

## 2020-08-04 ENCOUNTER — Encounter: Admit: 2020-08-04 | Discharge: 2020-08-04 | Payer: MEDICARE

## 2020-08-04 NOTE — Progress Notes
General Risk Score 7  HCC Score 2.514  Program criteria met -     Patient Reports:    1. Blood pressure this morning was 140/90. Took first dose of losartan this morning after taking blood pressure. Plans to monitor blood pressure with this new medication.   2. Took first dose of Metformin this morning. Understands that diarrhea can be a side effect for the first couple of weeks so will be prepared for that.   3. Has a gym membership and would like to start going again. Caregiver, Conan Bowens, will be able to drive him to the gym. Also going to look into going for short walks as tolerated.   4. Still interested in weight loss. Referral to dietician ordered and awaiting call.   5. Hoping that he will not have too many side effects from medications.     Patient Goals:    1. Blood pressure under control  2. Diabetes under control  3. Increase activity     Barriers to Care:    1. Knowledge deficit   2. Chronic conditions    Care Plan:    1. Continue with new medications. Report symptoms such as lightheadedness, low blood pressure readings. Continue to monitor blood pressure.   2. Encouraged Allen Gill with gym membership and increasing activity with short walks. Will help in overall health and strengthening as well as weight loss, blood pressure and diabetes control  3. Referral to dietician in place.  4. Instructed patient to call care manager any time with questions or concerns.   5. Routing note to PCP    LOV:     07/29/2020    Upcoming appointments include:    Future Appointments   Date Time Provider [place]   09/08/2020  1:45 PM Lupita Dawn, [facility]   10/29/2020  1:30 PM Joneen Caraway, MD [place]   07/29/2021  2:00 PM [facility]   07/29/2021  3:45 PM Harriette Ohara, MD [facility]       Next planned CM outreach (approximate):     4-6 weeks

## 2020-08-26 ENCOUNTER — Encounter: Admit: 2020-08-26 | Discharge: 2020-08-26 | Payer: MEDICARE

## 2020-08-26 NOTE — Telephone Encounter
Called and advise patient his FMLA paperwork was filled out and ready for pick up. Will wait for the Fax from the apartment facility.

## 2020-09-02 ENCOUNTER — Encounter: Admit: 2020-09-02 | Discharge: 2020-09-02 | Payer: MEDICARE

## 2020-09-02 DIAGNOSIS — I10 Essential (primary) hypertension: Secondary | ICD-10-CM

## 2020-09-02 DIAGNOSIS — I428 Other cardiomyopathies: Secondary | ICD-10-CM

## 2020-09-02 MED ORDER — SPIRONOLACTONE 25 MG PO TAB
50 mg | ORAL_TABLET | Freq: Every day | ORAL | 1 refills | 90.00000 days | Status: AC
Start: 2020-09-02 — End: ?

## 2020-09-02 MED ORDER — SPIRONOLACTONE 25 MG PO TAB
ORAL_TABLET | Freq: Every day | 0 refills
Start: 2020-09-02 — End: ?

## 2020-09-03 ENCOUNTER — Encounter: Admit: 2020-09-03 | Discharge: 2020-09-03 | Payer: MEDICARE
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
## 2020-09-06 ENCOUNTER — Encounter: Admit: 2020-09-06 | Discharge: 2020-09-06 | Payer: MEDICARE

## 2020-09-06 MED ORDER — FUROSEMIDE 20 MG PO TAB
ORAL_TABLET | Freq: Every day | 0 refills | PRN
Start: 2020-09-06 — End: ?

## 2020-09-08 ENCOUNTER — Encounter: Admit: 2020-09-08 | Discharge: 2020-09-08 | Payer: MEDICARE

## 2020-09-08 ENCOUNTER — Ambulatory Visit: Admit: 2020-09-08 | Discharge: 2020-09-08 | Payer: MEDICARE

## 2020-09-08 DIAGNOSIS — Q059 Spina bifida, unspecified: Principal | ICD-10-CM

## 2020-09-08 DIAGNOSIS — G919 Hydrocephalus, unspecified: Secondary | ICD-10-CM

## 2020-09-08 DIAGNOSIS — I10 Essential (primary) hypertension: Secondary | ICD-10-CM

## 2020-09-08 DIAGNOSIS — G4733 Obstructive sleep apnea (adult) (pediatric): Secondary | ICD-10-CM

## 2020-09-08 DIAGNOSIS — G40209 Localization-related (focal) (partial) symptomatic epilepsy and epileptic syndromes with complex partial seizures, not intractable, without status epilepticus: Principal | ICD-10-CM

## 2020-09-08 DIAGNOSIS — I429 Cardiomyopathy, unspecified: Secondary | ICD-10-CM

## 2020-09-08 DIAGNOSIS — N200 Calculus of kidney: Secondary | ICD-10-CM

## 2020-09-08 DIAGNOSIS — R339 Retention of urine, unspecified: Secondary | ICD-10-CM

## 2020-09-08 DIAGNOSIS — R51 Headache: Secondary | ICD-10-CM

## 2020-09-08 DIAGNOSIS — R000 Tachycardia, unspecified: Secondary | ICD-10-CM

## 2020-09-08 DIAGNOSIS — N319 Neuromuscular dysfunction of bladder, unspecified: Secondary | ICD-10-CM

## 2020-09-08 DIAGNOSIS — Q0700 Arnold-Chiari syndrome without spina bifida or hydrocephalus: Secondary | ICD-10-CM

## 2020-09-08 DIAGNOSIS — Z01810 Encounter for preprocedural cardiovascular examination: Secondary | ICD-10-CM

## 2020-09-08 DIAGNOSIS — N390 Urinary tract infection, site not specified: Secondary | ICD-10-CM

## 2020-09-08 DIAGNOSIS — T85618A Breakdown (mechanical) of other specified internal prosthetic devices, implants and grafts, initial encounter: Secondary | ICD-10-CM

## 2020-09-08 DIAGNOSIS — R569 Unspecified convulsions: Secondary | ICD-10-CM

## 2020-09-08 DIAGNOSIS — F988 Other specified behavioral and emotional disorders with onset usually occurring in childhood and adolescence: Secondary | ICD-10-CM

## 2020-09-08 MED ORDER — LEVETIRACETAM 500 MG PO TAB
500 mg | ORAL_TABLET | Freq: Two times a day (BID) | ORAL | 3 refills | 90.00000 days | Status: AC
Start: 2020-09-08 — End: ?

## 2020-09-08 NOTE — Patient Instructions
Continue antiepileptic medication: Keppra 500mg  twice a day. Prescription sent.  F/U with Dr. in one year or sooner prn.   If seizure occurs contact Dr. Kurtis Bushman office.    Call Dr. Alene Mires office for seizures.  [phone] option 2 to speak to your Provider's nurse.  May also send a message on mychart.

## 2020-09-10 ENCOUNTER — Encounter: Admit: 2020-09-10 | Discharge: 2020-09-10 | Payer: MEDICARE

## 2020-09-22 ENCOUNTER — Encounter: Admit: 2020-09-22 | Discharge: 2020-09-22 | Payer: MEDICARE

## 2020-09-22 NOTE — Progress Notes
General Risk Score 6  HCC Score 2.514  Program criteria met -     Patient Reports:    1. "Doing okay." Did receive paperwork for apartment complex to be on first floor.   2. Unsure of blood pressures. Has not been checking regularly but plans to check more often.   3. Has upcoming appointment with Dr. Joaquim Lai on 9/22 to review diabetes medications.   4. Recently bought pedal exerciser for home. Started using it yesterday and likes it so far.   5. Referred to dietician. Awaiting call.     Patient Goals:    1. Monitor blood pressure  2. Diabetes under control  3. Increase activity     Barriers to Care:    1. Knowledge deficit   2. Chronic conditions    Care Plan:    1. Continue with new medications. Report symptoms such as lightheadedness, low blood pressure readings. Continue to monitor blood pressure.   2. Increase activity with new pedal exercise machine as tolerated   3. Referral to dietician in place.  4. Instructed patient to call care manager any time with questions or concerns.   5. Routing note to PCP    LOV:     07/29/2020      Upcoming appointments include:    Future Appointments   Date Time Provider [place]   10/02/2020  1:30 PM Joneen Caraway, MD [place]   10/29/2020  1:30 PM Joneen Caraway, MD [place]   10/30/2020  2:30 PM Rayburn Go, APRN-NP [facility] Exam   07/29/2021  2:00 PM [facility]   07/29/2021  3:45 PM Harriette Ohara, MD [facility]       Next planned CM outreach (approximate):     4-6 weeks

## 2020-09-25 ENCOUNTER — Encounter: Admit: 2020-09-25 | Discharge: 2020-09-25 | Payer: MEDICARE

## 2020-09-25 NOTE — Telephone Encounter
Fax received from [facility] [place], [place]. Requesting Clearance for General Anesthesia from a Cardiac standpoint, as well as "Patient planned for extraction of impacted 3rd Molars in OR setting. Inquiring about CV Risk Factors/Risk Stratification. Are there any pre/intraop/postop modifications you would recommend?"  Scheduling of the above is contingent upon obtaining the above from Dr. Renita Papa.   Will route this message to Dr. Renita Papa and [facility] team.  Patient last seen by Dr. Shela Commons. [place] in 06/2020. Patient has not been seen by Dr. Renita Papa as of yet.  Form can be found in [facility] team's Right Fax.

## 2020-10-02 ENCOUNTER — Encounter: Admit: 2020-10-02 | Discharge: 2020-10-02 | Payer: MEDICARE

## 2020-10-02 ENCOUNTER — Ambulatory Visit: Admit: 2020-10-02 | Discharge: 2020-10-02 | Payer: MEDICARE

## 2020-10-02 DIAGNOSIS — R51 Headache: Secondary | ICD-10-CM

## 2020-10-02 DIAGNOSIS — T85618A Breakdown (mechanical) of other specified internal prosthetic devices, implants and grafts, initial encounter: Secondary | ICD-10-CM

## 2020-10-02 DIAGNOSIS — E1165 Type 2 diabetes mellitus with hyperglycemia: Secondary | ICD-10-CM

## 2020-10-02 DIAGNOSIS — F988 Other specified behavioral and emotional disorders with onset usually occurring in childhood and adolescence: Secondary | ICD-10-CM

## 2020-10-02 DIAGNOSIS — I429 Cardiomyopathy, unspecified: Secondary | ICD-10-CM

## 2020-10-02 DIAGNOSIS — I428 Other cardiomyopathies: Principal | ICD-10-CM

## 2020-10-02 DIAGNOSIS — R569 Unspecified convulsions: Secondary | ICD-10-CM

## 2020-10-02 DIAGNOSIS — Z01810 Encounter for preprocedural cardiovascular examination: Secondary | ICD-10-CM

## 2020-10-02 DIAGNOSIS — N390 Urinary tract infection, site not specified: Secondary | ICD-10-CM

## 2020-10-02 DIAGNOSIS — R000 Tachycardia, unspecified: Secondary | ICD-10-CM

## 2020-10-02 DIAGNOSIS — N319 Neuromuscular dysfunction of bladder, unspecified: Secondary | ICD-10-CM

## 2020-10-02 DIAGNOSIS — E119 Type 2 diabetes mellitus without complications: Secondary | ICD-10-CM

## 2020-10-02 DIAGNOSIS — G919 Hydrocephalus, unspecified: Secondary | ICD-10-CM

## 2020-10-02 DIAGNOSIS — Q0700 Arnold-Chiari syndrome without spina bifida or hydrocephalus: Secondary | ICD-10-CM

## 2020-10-02 DIAGNOSIS — G4733 Obstructive sleep apnea (adult) (pediatric): Secondary | ICD-10-CM

## 2020-10-02 DIAGNOSIS — I10 Essential (primary) hypertension: Secondary | ICD-10-CM

## 2020-10-02 DIAGNOSIS — Q059 Spina bifida, unspecified: Principal | ICD-10-CM

## 2020-10-02 DIAGNOSIS — N200 Calculus of kidney: Secondary | ICD-10-CM

## 2020-10-02 DIAGNOSIS — R339 Retention of urine, unspecified: Secondary | ICD-10-CM

## 2020-10-02 MED ORDER — BLOOD-GLUCOSE METER MISC MISC
1 | Freq: Every day | 0 refills | 50.00000 days | Status: AC
Start: 2020-10-02 — End: ?

## 2020-10-02 MED ORDER — LANCETS MISC MISC
1 | Freq: Every day | 3 refills | Status: AC
Start: 2020-10-02 — End: ?

## 2020-10-02 MED ORDER — ONETOUCH ULTRA TEST MISC STRP
1 | ORAL_STRIP | Freq: Every day | 3 refills | 90.00000 days | Status: AC
Start: 2020-10-02 — End: ?

## 2020-10-02 MED ORDER — LOSARTAN 100 MG PO TAB
100 mg | ORAL_TABLET | Freq: Every day | ORAL | 3 refills | 30.00000 days | Status: AC
Start: 2020-10-02 — End: ?

## 2020-10-09 ENCOUNTER — Encounter: Admit: 2020-10-09 | Discharge: 2020-10-09 | Payer: MEDICARE

## 2020-10-09 NOTE — Progress Notes
Received fax from [facility] requesting medical clearance and recommendations for patients upcoming molar extraction. Discussed with Dr. Lawson Fiscal via voalte., Dr. Lawson Fiscal states:     "We have no neurosurgical considerations for Jahlani"      Letter written and faxed to [facility] [place] at [phone] with Dr. Aleatha Borer recommendation.

## 2020-10-14 ENCOUNTER — Encounter: Admit: 2020-10-14 | Discharge: 2020-10-14 | Payer: MEDICARE

## 2020-10-14 ENCOUNTER — Ambulatory Visit: Admit: 2020-10-14 | Discharge: 2020-10-14 | Payer: MEDICARE

## 2020-10-14 DIAGNOSIS — E119 Type 2 diabetes mellitus without complications: Secondary | ICD-10-CM

## 2020-10-14 NOTE — Progress Notes
An initial comprehensive medication management visit was completed today in person.    Referral reason: Diabetes Management  Referring provider: Orson Gear, MD    Assessment & Plan     Diabetes  Patient's diabetes is uncontrolled as reflected by A1C. Their goal glucose is pre-prandial 80-130 and post-prandial <180. Their most recent A1C was 9.4% on 07/07/2020 with a goal of < 7% based on ADA guidelines.    Patient recently started Januvia x 2 weeks. Patient brought diabetic testing supplies and these were reviewed during his appointment. PharmD reviewed how to use glucometer, test strips, lancets, and lancing device. Patient agreeable to start checking morning FBG readings daily. Goal is for FBG 130mg /dl.     Plan  - start checking FBG daily  - continue Januvia 100mg  daily  - KEEP dietician appt on 10/13  - KEEP PCP appt on 10/19    Dyslipidemia  The patient is on statin therapy. They are on high-intensity statin therapy.  Other lipid lowering agents: yes  The patient is on fish oil.    Therapy is indicated for primary prevention of ASCVD in adults with severe hypercholesterolemia.    The current regimen is appropriate. The last LDL was 241 which is not meeting the patient's goal of > 50% reduction from baseline based on 2018 ACC/AHA guidelines. This is because it is too soon to assess.    Plan  - continue rosuvastatin 20mg  daily  - continue OTC fish oil      Follow-up  The patient will continue to follow up with the pharmacist. Return to pharmacist in 1 month via telephone. The return visit was scheduled during today's visit.    Subjective & Objective    Patient reports losing about 15 lbs recently.    Diabetes    HPI  Current diabetes medication regimen:   - Januvia 100mg  daily (started x 2 weeks)  Previous medications for diabetes:   - metformin IR (diarrhea)  CGM utilization: n/a    Blood Glucose  Monitoring times: fasting  The baseline HbA1C was 9.4 on 07/07/2020.    Monitoring  Patient instructed to test blood glucose 1 time(s) per day.    Dyslipidemia    HPI  Current dyslipidemia medication regimen:   - rosuvastatin 20mg  daily  - OTC fish oil  Criteria for statin use: severe hypercholesterolemia    Comorbidities  ASCVD condition(s): non-ischemic cardiomyopathy  CKD: no  Diabetes: yes (type 2 and uncontrolled)  Heart failure: yes  Obesity: yes    Health Maintenance  Aspirin utilization: Patient is not taking aspirin due to the following reason(s): not indicated.  Statin utilization: Patient is taking a statin.  Hypertension: Patient has hypertension, which is not controlled.    Patient Reported Readings   - patient just received glucometer, test strips, and lancets  - reviewed use during appointment    Laboratory Values  Lab Draw:  Lab Results       Component                Value               Date/Time                  HGBA1C                   9.4 (H)             07/07/2020 02:20 PM        HGBA1C  6.9 (H)             08/16/2019 02:47 PM   Comprehensive Metabolic Profile   Lab Results       Component                Value               Date/Time                  NA                       138                 07/07/2020 02:20 PM        K                        4.4                 07/07/2020 02:20 PM        CL                       98                  07/07/2020 02:20 PM        CO2                      31                  07/07/2020 02:20 PM        GAP                      11                  07/06/2020 02:10 PM        BUN                      13                  07/07/2020 02:20 PM        CR                       0.62                07/07/2020 02:20 PM        GLU                      143 (H)             07/07/2020 02:20 PM  Lab Results       Component                Value               Date/Time                  CA                       9.3                 07/07/2020 02:20 PM        PO4  3.9                 05/07/2019 03:43 AM        ALBUMIN                  4.2 07/06/2020 02:10 PM        TOTPROT                  7.4                 07/06/2020 02:10 PM        ALKPHOS                  83                  07/06/2020 02:10 PM        AST                      49 (H)              07/06/2020 02:10 PM        ALT                      63 (H)              07/06/2020 02:10 PM        TOTBILI                  0.5                 07/06/2020 02:10 PM        GFR                      132                 07/07/2020 02:20 PM        GFRAA                    153                 07/07/2020 02:20 PM   No results found for: URMALBCRRAT  Lab Results       Component                Value               Date                       CHOL                     328 (H)             07/07/2020                 TRIG                     270 (H)             07/07/2020                 HDL                      39 (L)              07/07/2020  LDL                      241 (H)             07/07/2020                 VLDL                     45                  04/13/2012                 NONHDLCHOL               289 (H)             07/07/2020                 CHOLHDLC                 8.4 (H)             07/07/2020               Medication History  Medication history was completed and the patient's medication list was updated to reflect what they are currently taking.    Drug-drug interactions were evaluated. There were not clinically significant drug-drug interactions.    Drug-food interactions were evaluated. There are not clinically significant drug-food interactions.    Adherence  Affordability concern: no    Adverse Drug Reactions  Adverse drug reactions were reviewed with the patient.    Significant adverse drug reaction(s) were not identified.    Side effect(s) were not reported.    The patient is filling their medications through one pharmacy. Patient is currently using [facility] based on patient preference. The patient is not interested in transferring to [facility].    Refills needed: no    Home Medications    Medication Sig   blood sugar diagnostic (ONETOUCH ULTRA TEST) test strip Use one strip as directed daily before breakfast. Diagnosis Code: DM-2   blood-glucose meter (ACCU-CHEK GUIDE GLUCOSE METER) kit Use one strip as directed daily. Diagnosis Code: DM-2   carvediloL (COREG) 6.25 mg tablet Take one tablet by mouth twice daily with meals. Take with food.   cholecalciferol(+) (VITAMIN D3) 2,000 unit tablet Take one tablet by mouth daily.   cyanocobalamin (VITAMIN B-12) 1,000 mcg tablet Take one tablet by mouth daily.   fish oil /omega-3 fatty acids (SEA-OMEGA) 340/1000 mg capsule Take 1 capsule by mouth daily.   furosemide (LASIX) 20 mg tablet TAKE 1 TABLET BY MOUTH EVERY DAY AS NEEDED   lancets MISC Use one each as directed daily. Diag Code: DM-2   Leg Brace misc Bilateral Fitted leg brace and right shoe for foot drop.  Dx: spina bifida.   levETIRAcetam (KEPPRA) 500 mg tablet Take one tablet by mouth twice daily.   losartan (COZAAR) 100 mg tablet Take one tablet by mouth daily.   Miscellaneous Medical Supply misc Shoe lift for right foot.  Dx: spina bifida   multivitamin with iron (MULTIPLE VITAMINS WITH IRON PO) Take 1 tablet by mouth daily.   Ostomy Supplies 1  misc [employer] 1 piece Urostomy bag.  Use 1 bag as needed.   rosuvastatin (CRESTOR) 20 mg tablet Take one tablet by mouth daily.   SITagliptin (JANUVIA) 100 mg tablet Take one tablet by mouth daily.   spironolactone (  ALDACTONE) 25 mg tablet Take two tablets by mouth daily. Take with food.      Education  Education provided: yes    Diabetes  Education components: goals of therapy, lifestyle management and medication adherence strategies    Time spent with patient: 40 minutes    Gardiner Ramus, PharmD  Comprehensive Medication Management Pharmacist  [phone]

## 2020-10-14 NOTE — Patient Instructions
FOLLOW-UP:  We will have a follow-up visit on November 1st at 11am via telephone.  Please call [phone] with any questions.      Thank you for visiting with [place] today! We are happy to have taken part in your care!

## 2020-10-17 ENCOUNTER — Encounter: Admit: 2020-10-17 | Discharge: 2020-10-17 | Payer: MEDICARE

## 2020-10-21 ENCOUNTER — Encounter: Admit: 2020-10-21 | Discharge: 2020-10-21 | Payer: MEDICARE

## 2020-10-21 NOTE — Telephone Encounter
Patient has OV with Diannia Ruder on 10/20 and Dr. Ames Dura on 10/25. Reviewed with Diannia Ruder, she advises OV with her be canceled.     Left message with patient stating his OV on 10/20 with Diannia Ruder was cancelled and we will see him at his OV with Dr. Ames Dura on 10/25. Left HF triage call back number.

## 2020-10-23 ENCOUNTER — Encounter: Admit: 2020-10-23 | Discharge: 2020-10-23 | Payer: MEDICARE

## 2020-10-29 ENCOUNTER — Ambulatory Visit: Admit: 2020-10-29 | Discharge: 2020-10-29 | Payer: MEDICARE

## 2020-10-29 ENCOUNTER — Encounter: Admit: 2020-10-29 | Discharge: 2020-10-29 | Payer: MEDICARE

## 2020-10-29 DIAGNOSIS — I10 Essential (primary) hypertension: Secondary | ICD-10-CM

## 2020-10-29 DIAGNOSIS — I429 Cardiomyopathy, unspecified: Secondary | ICD-10-CM

## 2020-10-29 DIAGNOSIS — T85618A Breakdown (mechanical) of other specified internal prosthetic devices, implants and grafts, initial encounter: Secondary | ICD-10-CM

## 2020-10-29 DIAGNOSIS — N200 Calculus of kidney: Secondary | ICD-10-CM

## 2020-10-29 DIAGNOSIS — N390 Urinary tract infection, site not specified: Secondary | ICD-10-CM

## 2020-10-29 DIAGNOSIS — Z0000 Encounter for general adult medical examination without abnormal findings: Secondary | ICD-10-CM

## 2020-10-29 DIAGNOSIS — F988 Other specified behavioral and emotional disorders with onset usually occurring in childhood and adolescence: Secondary | ICD-10-CM

## 2020-10-29 DIAGNOSIS — G919 Hydrocephalus, unspecified: Secondary | ICD-10-CM

## 2020-10-29 DIAGNOSIS — R000 Tachycardia, unspecified: Secondary | ICD-10-CM

## 2020-10-29 DIAGNOSIS — N319 Neuromuscular dysfunction of bladder, unspecified: Secondary | ICD-10-CM

## 2020-10-29 DIAGNOSIS — G4733 Obstructive sleep apnea (adult) (pediatric): Secondary | ICD-10-CM

## 2020-10-29 DIAGNOSIS — Q0700 Arnold-Chiari syndrome without spina bifida or hydrocephalus: Secondary | ICD-10-CM

## 2020-10-29 DIAGNOSIS — E1165 Type 2 diabetes mellitus with hyperglycemia: Secondary | ICD-10-CM

## 2020-10-29 DIAGNOSIS — Q059 Spina bifida, unspecified: Secondary | ICD-10-CM

## 2020-10-29 DIAGNOSIS — R569 Unspecified convulsions: Secondary | ICD-10-CM

## 2020-10-29 DIAGNOSIS — R51 Headache: Secondary | ICD-10-CM

## 2020-10-29 DIAGNOSIS — R339 Retention of urine, unspecified: Secondary | ICD-10-CM

## 2020-10-29 DIAGNOSIS — Z01810 Encounter for preprocedural cardiovascular examination: Secondary | ICD-10-CM

## 2020-10-29 MED ORDER — ONETOUCH ULTRA TEST MISC STRP
1 | ORAL_STRIP | Freq: Every day | 3 refills | 33.00000 days | Status: AC
Start: 2020-10-29 — End: ?

## 2020-10-29 MED ORDER — TRIAMCINOLONE ACETONIDE 0.1 % TP CREA
Freq: Two times a day (BID) | TOPICAL | 0 refills | Status: AC
Start: 2020-10-29 — End: ?

## 2020-10-29 NOTE — Patient Instructions
Health Maintenance   Topic Date Due    PNEUMOCOCCAL VACCINE (DM)  Never done    HIV SCREENING  Never done    FOOT EXAM  Never done    COVID-19 VACCINE (3 - Booster for Janssen series) 05/08/2020    INFLUENZA VACCINE  08/11/2020    HBA1C  01/06/2021    DILATED EYE EXAM  08/28/2021    PHYSICAL (COMPREHENSIVE) EXAM  10/29/2021    MEDICARE ANNUAL WELLNESS VISIT  10/29/2021    DTAP/TDAP VACCINES (8 - Td or Tdap) 05/24/2028    HEPATITIS C SCREENING  Completed    DEPRESSION SCREENING  Completed

## 2020-10-29 NOTE — Progress Notes
Date of Service: 10/29/2020    Allen Gill is a 30 y.o. male.  DOB: [birth year]  MRN: [id number]     Subjective:            He presents today for an Annual Medicare Wellness visit.    he was last seen by me 10/02/2020.  Patient states overall he is feeling well.  He has been checking his blood sugars every day and it ranges from 10 6-200.  His averages 170s.  Patient states he feels better.  He is eating better.  He has had weight loss.  Patient states he is tolerating the Januvia.  He does request a refill on his test strips.  Patient is planning to get his fasting labs drawn this week.  He has a follow-up appointment with cardiology soon.              Chief Complaint   Patient presents with   ? Annual Wellness Visit     Medicare [id number]/Fasting labs/Depression=0   ? Cholesterol     Crestor 20mg    ? Hypertension       Medical History:   Diagnosis Date   ? 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 Arnold-Chiari malformation (HCC)    ? Cardiomyopathy (HCC)    ? Headache(784.0)    ? Hydrocephalus (HCC)    ? Hypertension    ? Infection of VP shunt    ? Kidney stones     frequent   ? Myelomeningocele (HCC)    ? Neurogenic bladder    ? OSA (obstructive sleep apnea)    ? Preop cardiovascular exam 07/30/2016   ? Seizures (HCC)     blank staring spells in childhood - last absentee seizure 2012   ? Shunt malfunction    ? Spina bifida (HCC)    ? Tachycardia 07/30/2016   ? Urinary retention 02/19/2016    Added automatically from request for surgery [id number]   ? Urinary tract infection     frequent     Surgical History:   Procedure Laterality Date   ? SHUNT CREATION VENTRICULAR PERITONEAL Right 06/14/2014    Performed by Angelia Mould, MD at [facility] OR   ? SHUNT REVISION VENTRICULAR PERITONEAL Right 04/23/2015    Performed by Angelia Mould, MD at [facility] OR   ? REMOVAL HARDWARE HEAD: removal of left ventriculopleural shunt Left 05/20/2015    Performed by Angelia Mould, MD at [facility] OR   ? SHUNT REMOVAL VENTRICULAR PERITONEAL Right 05/23/2015    Performed by Angelia Mould, MD at [facility] OR   ? SHUNT CREATION VENTRICULAR PERITONEAL Left 06/03/2015    Performed by Angelia Mould, MD at [facility] OR   ? CYSTOURETHROSCOPY, CYSTOLITHOLAPAXY N/A 08/05/2015    Performed by Pennie Banter, MD at [facility] OR   ? HOLMIUM LASER ENUCLEATION OF PROSTATE (NO MORCELLATION) N/A 08/19/2015    Performed by Vonna Drafts, MD at [facility] OR   ? CYSTOSCOPY N/A 08/19/2015    Performed by Vonna Drafts, MD at [facility] OR   ? CYSTOSCOPY EVACUATION CLOTS N/A 08/29/2015    Performed by Vonna Drafts, MD at [facility] OR   ? CYSTOSCOPY, URETHERAL DILATION N/A 02/20/2016    Performed by Vonna Drafts, MD at [facility] OR   ? CYSTECTOMY, ILEAL CONDUIT N/A 10/13/2016    Performed by Glennie Isle, MD at [facility] OR   ? REVISION SHUNT - VENTRICULO-PERITONEAL Left 12/16/2016    Performed by Storm Frisk,  MD at [facility] OR   ? CREATION SHUNT - VENTRICULO-PERITONEAL: Left side. 2 hours, proximal catheter and valve in place. will need distal catheter placed, Dr. Joan Flores to complete. Supine Left 12/22/2016    Performed by Angelia Mould, MD at [facility] OR   ? PR REPRGRMG PROGRAMMABLE CEREBROSPINAL SHUNT  01/07/2017   ? PERCUTANEOUS NEPHROSTOLITHOTOMY/ PYELOSTOLITHOTOMY - GREATER THAN 2 CM Left 05/25/2017    Performed by Clarita Crane, MD, [facility] OR   ? CREATION SHUNT - VENTRICULO-PERITONEAL Right 08/04/2017    Performed by Angelia Mould, MD at [facility] OR   ? PERCUTANEOUS NEPHROSTOLITHOTOMY/ PYELOSTOLITHOTOMY - 2 CM OR LESS Right 06/07/2019    Performed by Beckie Busing, MD at [facility] OR   ? CYSTOURETHROSCOPY WITH URETEROSCOPY AND/ OR PYELOSCOPY - WITH REMOVAL/ MANIPULATION CALCULUS Right 06/07/2019    Performed by Beckie Busing, MD at [facility] OR   ? CATHETER IMPLANT/REVISION  12/27/09    distal end of the catheter was revised   ? CATHETER IMPLANT/REVISION  01/31/10    replacement of ventricular catheter with [employer] framelessstereotaxis catheter   ? HX ABDOMEN SURGERY      fundoplication   ? HX ABDOMEN SURGERY  01/2001 Cecostomy   ? HX BACK SURGERY      repair of spina bifida   ? HX BRAIN SURGERY  5 months old    Chiari decompression   ? HX EAR TUBES     ? HX HERNIA REPAIR      inguinal hernia   ? HX SURGERY  at 2 weeks    VP Shunt   ? HX TONSILLECTOMY     ? HX TRACHEOSTOMY     ? SHUNT REVISION  3 months old   ? SHUNT REVISION  30 years old   ? SHUNT REVISION  October 2010    [place]   ? SHUNT REVISION  12/11/09    replacment of valve to acodman hakem adjustablevalve   ? SHUNT REVISION  02/17/10    left frontal ventriculopleural shunt   ? VENTRICULOSTOMY  02/03/10    removal of all shunt components and placementofright frontal ventriculostomy     Family History   Problem Relation Age of Onset   ? Diabetes Father    ? Hypertension Father    ? Other Father         glaucoma   ? High Cholesterol Maternal Grandfather      Social History     Socioeconomic History   ? Marital status: Single   Occupational History   ? 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 Smoking status: Never Smoker   ? Smokeless tobacco: Never Used   Vaping Use   ? Vaping Use: Never used   Substance and Sexual Activity   ? Alcohol use: No   ? Drug use: No   ? Sexual activity: Not Currently     Partners: Female      I reviewed medications, allergies, problem list and tobacco history at this visit.    A Health Risk assessment was performed by the patient today, reviewed with the patient.      Health Risk Assessment Questionnaire  Current Care  List of Providers you have seen in the last two years: Dr.Usyle/Neurologist;DR.Wyre.Urologist  Are you receiving home health?: No  During the past 4 weeks, how would you rate your health in general?: Good    Outside Care  Since your last PCP visit, have you  received care outside of [facility]?: No        Physical Activity  Do you exercise or are you physically active?: Yes  How many days a week do you usually exercise or are physically active?: 7  On days when you exercised or were physically active, how many minutes was the activity?: 30  During the past four weeks, what was the hardest physical activity you could do for at least two minutes?: Moderate    Diet  In the past month, were you worried whether your food would run out before you or your family had money to buy more?: No  In the past 7 days, how many times did you eat fast food or junk food or pizza?: 1  In the past 7 days, how many servings of fruits or vegetables did you eat each day?: (!) 2-3  In the past 7 days, how many sodas and sugar sweetened drinks (regular, not diet) did you drink each day?: (!) 2    Smoke/Tobacco Use  Are you currently a smoker?: No      Alcohol Use  Do you drink alcohol?: No          Depression Screen  Little interest or pleasure in doing things: Not at All  Feeling down, depressed or hopeless: Not at All    Patient Scores:  PHQ-2: PHQ-2 Score: 0 (10/29/2020  1:06 PM)    PHQ-9: PHQ-9 Score: 0 (10/29/2020  1:06 PM)    Interventions:  PHQ-2: PHQ-2 Score less than 3: No follow-up or recommendations are necessary at this time (09/08/2020  1:37 PM)    Depression Interventions PHQ-2/9: No data recorded      Pain  How would you rate your pain today?: No pain    Ambulation  Do you use any assistive devices for ambulation?: No      Fall Risk  Does it take you longer than 30 seconds to get up and out of a chair?: No  Have you fallen in the past year?: No  Fall History (last 34mo): No Falls    Motor Vehicle Safety  Do you fasten your seat belt when you are in the car?: Yes    Sun Exposure  Do you protect yourself from the sun? For example, wear sunscreen when outside.: (!) No    Hearing Loss  Do you have trouble hearing the television or radio when others do not?: No  Do you have to strain or struggle to hear/understand conversation?: No  Do you use hearing aids?: No    Cognitive Impairment  During the past 12 months, have you experienced confusion or memory loss that is happening more often or is getting worse?: No    Functional Screen  Do you live alone?: Yes  Do you live at: Home  Gill you drive your own car or travel alone by bus or taxi?: Yes  Gill you shop for groceries or clothes without help?: (!) No  Gill you prepare your own meals?: (!) No  Gill you do your own housework without help?: (!) No  Gill you handle your own money without help?: Yes  Do you need help eating, bathing, dressing, or getting around your home?: No  Do you feel safe?: Yes  Does anyone at home hurt you, hit you, or threaten you?: No  Have you ever been the victim of abuse?: No    Home Safety  Does your home have grab bars in the bathroom?: Marland Kitchen)  No  Does your home have hand rails on stairs and steps?: Yes  Does your home have functioning smoke alarms?: Yes    Advance Directive  Do you have a living will or Advance Directive?: Yes      Dental Screen  Have you had an exam by your dentist in the last year?: Yes    Vision Screen  Do you have diabetes?: (!) Yes  When was your last eye exam?: 08/28/20  Eye Doctor Name?: DR.[facility]  Eye Doctor Facility?: [facility]    In the last 12 months, has your utility company shut off your service for not paying your bills?: No (10/29/2020  1:01 PM)  Are you worried that in the next 2 months, you may not have stable housing?: No (10/29/2020  1:01 PM)  Are you afraid that you may be hurt in your home by someone you know?: No (10/29/2020  1:01 PM)  Are you afraid you might be hurt in your apartment building or neighborhood?: No (10/29/2020  1:01 PM)  Do problems getting childcare make it difficult to work or study?: No (10/29/2020  1:01 PM)  In the last 12 months, have you needed to see a doctor, but could not because of cost?: No (10/29/2020  1:01 PM)  In the last 12 months, did you skip medications to save money?: No (10/29/2020  1:01 PM)  In the past 12 months, have you ever gone without  health care because you didn't have a way to get there?: No (10/29/2020  1:01 PM)  Do you have problems understanding what is told to you about your medical conditions?: No (10/29/2020  1:01 PM)  Do you often feel that you lack companionship?: No (10/29/2020  1:01 PM)  If you answered yes to any questions above, would you like to discuss help with your social work team?: No (10/29/2020  1:01 PM)      Personal prevention Plan reviewed with patient.    While the patient was here today, due to his/her multiple chronic conditions it would be in the best interest of the patient for me to monitor, assess and evaluate those as well. They are as follows.  Patient Active Problem List    Diagnosis Date Noted   ? Complex care coordination 09/14/2016     Priority: High   ? Type 2 diabetes mellitus with hyperglycemia (HCC) 10/02/2020   ? Type 2 diabetes mellitus without complication, without long-term current use of insulin (HCC) 07/29/2020   ? Visit for wound check 08/07/2019   ? Neurogenic bowel 06/12/2019   ? Nephrolithiasis 06/07/2019   ? Hypotension 05/02/2019   ? Aspiration into respiratory tract 05/02/2019   ? GERD (gastroesophageal reflux disease) 05/02/2019   ? Right nephrolithiasis 05/02/2019   ? Hypokalemia 05/02/2019   ? Hypomagnesemia 05/02/2019   ? Hyperglycemia 05/02/2019   ? Leukocytosis 05/02/2019   ? Fever 05/02/2019   ? Small bowel obstruction (HCC) 11/28/2018   ? Chronic combined systolic (congestive) and diastolic (congestive) heart failure (HCC) 11/24/2018   ? UTI (urinary tract infection) 03/10/2018   ? Pyelonephritis 04/13/2017   ? Neurogenic bladder 12/16/2016   ? 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 Non-ischemic cardiomyopathy (HCC) 09/29/2016   ? Essential hypertension 07/30/2016   ? Urethral Parilee Hally 07/31/2015     Overview Note:     Added automatically from request for surgery [phone]     ? Morbid obesity (HCC) 05/19/2015   ? Seizure disorder (HCC) 05/19/2015   ?  Transaminitis 05/19/2015   ? Hydrocephalus (HCC) 04/28/2015   ? Kidney Robinn Overholt 02/14/2012     Overview Note:     [place] 12/27/11: The kidneys are normal in size. The right kidney measures 11.0 x 5.5 cm and contains a large calculus in the mid to inferior pelvis, measuring 4.8 cm x  0.6 cm x 1.5 cm. There is mild pelviectasis which fails to resolve on post void imaging. The left kidney measures 11.7 x 5.4 cm. No renal masses are identified.  02/28/12 CT showed right staghorn calculus and a small left upper pole Korianna Washer.  04/07/12 right PCNL - stable post op course for 2 days and discharged. Readmitted with hypotension and respiratory distress and acute drop in H/H. No PE and pleural effusions. No signs of bleeding/no perinephric bleeding. Sepsis w/u neg but placed on erapenum. Second look dealyed until 04/21/12 - successful and dishcaged to [facility]  06/09/12 Recovered fully and now for metabolic w/u               ? 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 Arnold-Chiari malformation (HCC) 11/27/2009   ? ADD (attention deficit disorder) 10/28/2006   ? Spina bifida (HCC) 10/28/2006   ? Unspecified constipation 10/28/2006       Other concerns addressed at this visit -  Diabetes, hypertension    Records requested at the time of this visit:No    Prior consultations, labs, radiology reports reviewed at the time of this visit.No            Depression:  Patient Scores:  PHQ-2: PHQ-2 Score: 0 (10/29/2020  1:06 PM)    PHQ-9: PHQ-9 Score: 0 (10/29/2020  1:06 PM)    Interventions:  PHQ-2: PHQ-2 Score less than 3: No follow-up or recommendations are necessary at this time (09/08/2020  1:37 PM)    Depression Interventions PHQ-2/9: No data recorded  BMI:  Body mass index is 46.72 kg/m?Marland Kitchen  High BMI: Counseled regarding weight loss (10/29/2020  1:06 PM)    Wt Readings from Last 10 Encounters:   10/29/20 123.5 kg (272 lb 3.2 oz)   10/02/20 127 kg (280 lb)   09/08/20 130.6 kg (288 lb)   07/29/20 130.9 kg (288 lb 9.6 oz)   07/23/20 122.5 kg (270 lb)   07/06/20 117.9 kg (260 lb)   06/12/20 127 kg (280 lb)   03/06/20 124.7 kg (275 lb)   09/03/19 121.4 kg (267 lb 9.6 oz)   08/16/19 121.2 kg (267 lb 1.6 oz)       Falls:  Fall History (last 19mo): No Falls (10/29/2020  1:06 PM)  Fall Risk: None identified (10/29/2020  1:06 PM)                     Review of Systems   Constitutional: Negative for fever and malaise/fatigue.   HENT: Negative for congestion.    Eyes: Negative for blurred vision.   Cardiovascular: Negative for chest pain and dyspnea on exertion.   Respiratory: Negative for cough and shortness of breath.    Skin: Negative for rash.   Musculoskeletal: Positive for myalgias. Negative for back pain.   Gastrointestinal: Negative for abdominal pain, constipation, diarrhea, heartburn and nausea.   Neurological: Negative for headaches.   Psychiatric/Behavioral: Negative for depression. The patient is not nervous/anxious.              Objective:         ? blood sugar diagnostic (ONETOUCH ULTRA TEST) test strip Use one strip  as directed daily before breakfast. Diagnosis Code: Uncontrolled DM-2   ? blood-glucose meter (ACCU-CHEK GUIDE GLUCOSE METER) kit Use one strip as directed daily. Diagnosis Code: DM-2   ? carvediloL (COREG) 6.25 mg tablet Take one tablet by mouth twice daily with meals. Take with food.   ? cholecalciferol(+) (VITAMIN D3) 2,000 unit tablet Take one tablet by mouth daily.   ? cyanocobalamin (VITAMIN B-12) 1,000 mcg tablet Take one tablet by mouth daily.   ? fish oil /omega-3 fatty acids (SEA-OMEGA) 340/1000 mg capsule Take 1 capsule by mouth daily.   ? furosemide (LASIX) 20 mg tablet TAKE 1 TABLET BY MOUTH EVERY DAY AS NEEDED   ? lancets MISC Use one each as directed daily. Diag Code: DM-2   ? Leg Brace misc Bilateral Fitted leg brace and right shoe for foot drop.  Dx: spina bifida.   ? levETIRAcetam (KEPPRA) 500 mg tablet Take one tablet by mouth twice daily.   ? losartan (COZAAR) 100 mg tablet Take one tablet by mouth daily.   ? Miscellaneous Medical Supply misc Shoe lift for right foot.  Dx: spina bifida   ? multivitamin with iron (MULTIPLE VITAMINS WITH IRON PO) Take 1 tablet by mouth daily.   ? Doctor, general practice 1 piece Urostomy bag.  Use 1 bag as needed.   ? rosuvastatin (CRESTOR) 20 mg tablet Take one tablet by mouth daily.   ? SITagliptin (JANUVIA) 100 mg tablet Take one tablet by mouth daily.   ? spironolactone (ALDACTONE) 25 mg tablet Take two tablets by mouth daily. Take with food.   ? triamcinolone acetonide (TRIDERM) 0.1 % topical cream Apply  topically to affected area twice daily.     Vitals:    10/29/20 1300   BP: (!) 152/84   Pulse: 76   Temp: 36.8 ?C (98.3 ?F)   Resp: 18   PainSc: Zero   Weight: 123.5 kg (272 lb 3.2 oz)     Body mass index is 46.72 kg/m?Marland Kitchen   Vitals:    10/29/20 1300   BP: (!) 152/84   Patient Position: Sitting   Pulse: 76       Physical Exam  Vitals and nursing note reviewed.   Constitutional:       Appearance: Normal appearance.   HENT:      Head: Normocephalic and atraumatic.      Right Ear: Tympanic membrane, ear canal and external ear normal.      Left Ear: Tympanic membrane, ear canal and external ear normal.      Nose: Nose normal.      Mouth/Throat:      Mouth: Mucous membranes are moist.      Pharynx: Oropharynx is clear.   Eyes:      Extraocular Movements: Extraocular movements intact.      Pupils: Pupils are equal, round, and reactive to light.   Neck:      Comments: No thyromegaly  Cardiovascular:      Rate and Rhythm: Normal rate and regular rhythm.      Pulses: Normal pulses.      Heart sounds: Normal heart sounds.   Pulmonary:      Effort: Pulmonary effort is normal.      Breath sounds: Normal breath sounds.   Abdominal:      General: Bowel sounds are normal.      Palpations: Abdomen is soft.   Musculoskeletal:      Cervical back: Neck supple.   Lymphadenopathy:  Cervical: No cervical adenopathy.   Skin:     General: Skin is warm and dry.      Findings: No rash.   Neurological:      Mental Status: He is alert and oriented to person, place, and time.   Psychiatric:         Mood and Affect: Mood normal.         Behavior: Behavior normal.         Health Maintenance Topic Date Due   ? PNEUMOCOCCAL VACCINE (DM)  Never done   ? HIV SCREENING  Never done   ? FOOT EXAM  Never done   ? HBA1C  01/06/2021   ? DILATED EYE EXAM  08/28/2021   ? PHYSICAL (COMPREHENSIVE) EXAM  10/29/2021   ? MEDICARE ANNUAL WELLNESS VISIT  10/29/2021   ? DTAP/TDAP VACCINES (8 - Td or Tdap) 05/24/2028   ? COVID-19 VACCINE  Completed   ? INFLUENZA VACCINE  Completed   ? HEPATITIS C SCREENING  Completed   ? DEPRESSION SCREENING  Completed        The ASCVD Risk score Denman George DC Jr., et al., 2013) failed to calculate for the following reasons:    The 2013 ASCVD risk score is only valid for ages 10 to 55        Assessment and Plan:    Allen Gill was seen today for annual wellness visit, cholesterol and hypertension.    Diagnoses and all orders for this visit:    Encounter for Medicare annual wellness exam    Essential hypertension    Type 2 diabetes mellitus with hyperglycemia, without long-term current use of insulin (HCC)    Other orders  -     FLU VACCINE =>6 MONTHS QUADRIVALENT PF  -     COVID-19 BIVALENT BOOSTER (72YR+)(PFIZER) VAC 30MCG/0.3ML  -     blood sugar diagnostic (ONETOUCH ULTRA TEST) test strip; Use one strip as directed daily before breakfast. Diagnosis Code: Uncontrolled DM-2  -     triamcinolone acetonide (TRIDERM) 0.1 % topical cream; Apply  topically to affected area twice daily.    Normal physical exam.  Flu and COVID-vaccine today.  Patient to have his fasting labs drawn.  Patient is otherwise up-to-date on age-appropriate screening    Blood pressure is uncontrolled.  Recently increase losartan to 100 mg.  Will continue to monitor.  Patient has a follow-up appointment with cardiology later.  May need to increase carvedilol to 12 and half milligrams twice daily.    Diabetes has been uncontrolled but is improving with dietary changes, weight loss and Januvia.  Will order A1c.  May consider adding Glucophage.        Encounter Medications   Medications   ? blood sugar diagnostic (ONETOUCH ULTRA TEST) test strip     Sig: Use one strip as directed daily before breakfast. Diagnosis Code: Uncontrolled DM-2     Dispense:  100 strip     Refill:  3   ? triamcinolone acetonide (TRIDERM) 0.1 % topical cream     Sig: Apply  topically to affected area twice daily.     Dispense:  45 g     Refill:  0     Patient Instructions     Health Maintenance   Topic Date Due   ? PNEUMOCOCCAL VACCINE (DM)  Never done   ? HIV SCREENING  Never done   ? FOOT EXAM  Never done   ? COVID-19 VACCINE (3 - Booster  for Janssen series) 05/08/2020   ? INFLUENZA VACCINE  08/11/2020   ? HBA1C  01/06/2021   ? DILATED EYE EXAM  08/28/2021   ? PHYSICAL (COMPREHENSIVE) EXAM  10/29/2021   ? MEDICARE ANNUAL WELLNESS VISIT  10/29/2021   ? DTAP/TDAP VACCINES (8 - Td or Tdap) 05/24/2028   ? HEPATITIS C SCREENING  Completed   ? DEPRESSION SCREENING  Completed        Visit Disposition     Dispositions    ? Return in about 6 months (around 04/29/2021) for Diabetes Follow Up, BP Follow up.          Future Appointments   Date Time Provider Department Center   11/04/2020  2:00 PM Kathleen Argue, [place] [facility] Exam   11/11/2020 11:00 AM Gardiner Ramus, [facility]   11/20/2020  2:15 PM Haynes Bast, RD [facility]   07/29/2021  2:00 PM [facility] Radiolog   07/29/2021  3:45 PM Beckie Busing, MD [facility]     Return in about 6 months (around 04/29/2021) for Diabetes Follow Up, BP Follow up.    I reviewed with the patient their current medications and specifically any new medications prescribed at the time of this visit and we reviewed the expected benefits and potential side effects. All questions are answered to the patient's satisfaction.    ? Health maintenance gaps were reviewed with the patient at the time of this visit.    ? I emphasized the importance of medication adherence.   ? The medical problems/diagnoses listed under assessment and plan were addressed at this visit and unless otherwise stated are adequately controlled.                Problem   Septic Shock (Hcc) (Resolved)

## 2020-10-30 ENCOUNTER — Encounter: Admit: 2020-10-30 | Discharge: 2020-10-30 | Payer: MEDICARE

## 2020-11-02 ENCOUNTER — Encounter: Admit: 2020-11-02 | Discharge: 2020-11-02 | Payer: MEDICARE

## 2020-11-03 ENCOUNTER — Encounter: Admit: 2020-11-03 | Discharge: 2020-11-03 | Payer: MEDICARE

## 2020-11-03 MED ORDER — ONETOUCH ULTRA TEST MISC STRP
1 | ORAL_STRIP | Freq: Every day | 3 refills | 33.00000 days | Status: AC
Start: 2020-11-03 — End: ?
  Filled 2021-01-21: qty 50, 30d supply, fill #1

## 2020-11-03 MED ORDER — LANCETS MISC MISC
1 | Freq: Every day | 3 refills | Status: AC
Start: 2020-11-03 — End: ?

## 2020-11-04 ENCOUNTER — Ambulatory Visit: Admit: 2020-11-04 | Discharge: 2020-11-04 | Payer: MEDICARE

## 2020-11-04 ENCOUNTER — Encounter: Admit: 2020-11-04 | Discharge: 2020-11-04 | Payer: MEDICARE

## 2020-11-04 DIAGNOSIS — Z01810 Encounter for preprocedural cardiovascular examination: Secondary | ICD-10-CM

## 2020-11-04 DIAGNOSIS — I10 Essential (primary) hypertension: Secondary | ICD-10-CM

## 2020-11-04 DIAGNOSIS — Q0700 Arnold-Chiari syndrome without spina bifida or hydrocephalus: Secondary | ICD-10-CM

## 2020-11-04 DIAGNOSIS — I429 Cardiomyopathy, unspecified: Secondary | ICD-10-CM

## 2020-11-04 DIAGNOSIS — N200 Calculus of kidney: Secondary | ICD-10-CM

## 2020-11-04 DIAGNOSIS — G919 Hydrocephalus, unspecified: Secondary | ICD-10-CM

## 2020-11-04 DIAGNOSIS — F988 Other specified behavioral and emotional disorders with onset usually occurring in childhood and adolescence: Secondary | ICD-10-CM

## 2020-11-04 DIAGNOSIS — R569 Unspecified convulsions: Secondary | ICD-10-CM

## 2020-11-04 DIAGNOSIS — G4733 Obstructive sleep apnea (adult) (pediatric): Secondary | ICD-10-CM

## 2020-11-04 DIAGNOSIS — I519 Heart disease, unspecified: Secondary | ICD-10-CM

## 2020-11-04 DIAGNOSIS — N319 Neuromuscular dysfunction of bladder, unspecified: Secondary | ICD-10-CM

## 2020-11-04 DIAGNOSIS — T85618A Breakdown (mechanical) of other specified internal prosthetic devices, implants and grafts, initial encounter: Secondary | ICD-10-CM

## 2020-11-04 DIAGNOSIS — Q059 Spina bifida, unspecified: Principal | ICD-10-CM

## 2020-11-04 DIAGNOSIS — R000 Tachycardia, unspecified: Secondary | ICD-10-CM

## 2020-11-04 DIAGNOSIS — N390 Urinary tract infection, site not specified: Secondary | ICD-10-CM

## 2020-11-04 DIAGNOSIS — R51 Headache: Secondary | ICD-10-CM

## 2020-11-04 DIAGNOSIS — I5042 Chronic combined systolic (congestive) and diastolic (congestive) heart failure: Secondary | ICD-10-CM

## 2020-11-04 DIAGNOSIS — R339 Retention of urine, unspecified: Secondary | ICD-10-CM

## 2020-11-04 MED ORDER — CARVEDILOL 6.25 MG PO TAB
12.5 mg | ORAL_TABLET | Freq: Two times a day (BID) | ORAL | 3 refills | 90.00000 days | Status: AC
Start: 2020-11-04 — End: ?

## 2020-11-04 NOTE — Progress Notes
Date of Service: 11/04/2020    Allen Gill is a 30 y.o. male.       HPI              Vitals:    11/04/20 1339   BP: 131/69   BP Source: Arm, Left Upper   Pulse: 92   SpO2: 97%   O2 Device: None (Room air)   PainSc: Zero   Weight: 124.6 kg (274 lb 9.6 oz)   Height: 162.6 cm (5' 4)     Body mass index is 47.13 kg/m?Marland Kitchen     Past Medical History  Patient Active Problem List    Diagnosis Date Noted   ? Complex care coordination 09/14/2016     Priority: High   ? Type 2 diabetes mellitus with hyperglycemia (HCC) 10/02/2020   ? Type 2 diabetes mellitus without complication, without long-term current use of insulin (HCC) 07/29/2020   ? Visit for wound check 08/07/2019   ? Neurogenic bowel 06/12/2019   ? Nephrolithiasis 06/07/2019   ? Hypotension 05/02/2019   ? Aspiration into respiratory tract 05/02/2019   ? GERD (gastroesophageal reflux disease) 05/02/2019   ? Right nephrolithiasis 05/02/2019   ? Hypokalemia 05/02/2019   ? Hypomagnesemia 05/02/2019   ? Hyperglycemia 05/02/2019   ? Leukocytosis 05/02/2019   ? Fever 05/02/2019   ? Small bowel obstruction (HCC) 11/28/2018   ? Chronic combined systolic (congestive) and diastolic (congestive) heart failure (HCC) 11/24/2018   ? UTI (urinary tract infection) 03/10/2018   ? Pyelonephritis 04/13/2017   ? Neurogenic bladder 12/16/2016   ? 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 Non-ischemic cardiomyopathy (HCC) 09/29/2016   ? Essential hypertension 07/30/2016   ? Urethral stone 07/31/2015     Added automatically from request for surgery [phone]     ? Morbid obesity (HCC) 05/19/2015   ? Seizure disorder (HCC) 05/19/2015   ? Transaminitis 05/19/2015   ? Hydrocephalus (HCC) 04/28/2015   ? Kidney stone 02/14/2012     [place] 12/27/11: The kidneys are normal in size. The right kidney measures 11.0 x 5.5 cm and contains a large calculus in the mid to inferior pelvis, measuring 4.8 cm x  0.6 cm x 1.5 cm. There is mild pelviectasis which fails to resolve on post void imaging. The left kidney measures 11.7 x 5.4 cm. No renal masses are identified.  02/28/12 CT showed right staghorn calculus and a small left upper pole stone.  04/07/12 right PCNL - stable post op course for 2 days and discharged. Readmitted with hypotension and respiratory distress and acute drop in H/H. No PE and pleural effusions. No signs of bleeding/no perinephric bleeding. Sepsis w/u neg but placed on erapenum. Second look dealyed until 04/21/12 - successful and dishcaged to [facility]  06/09/12 Recovered fully and now for metabolic w/u               ? 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 Arnold-Chiari malformation (HCC) 11/27/2009   ? ADD (attention deficit disorder) 10/28/2006   ? Spina bifida (HCC) 10/28/2006   ? Unspecified constipation 10/28/2006         Review of Systems   All other systems reviewed and are negative.      Physical Exam      Cardiovascular Studies      Cardiovascular Health Factors  Vitals BP Readings from Last 3 Encounters:   11/04/20 131/69   10/29/20 (!) 152/84   10/02/20 (!) 160/96     Wt Readings from  Last 3 Encounters:   11/04/20 124.6 kg (274 lb 9.6 oz)   10/29/20 123.5 kg (272 lb 3.2 oz)   10/02/20 127 kg (280 lb)     BMI Readings from Last 3 Encounters:   11/04/20 47.13 kg/m?   10/29/20 46.72 kg/m?   10/02/20 48.06 kg/m?      Smoking Social History     Tobacco Use   Smoking Status Never Smoker   Smokeless Tobacco Never Used      Lipid Profile Cholesterol   Date Value Ref Range Status   07/07/2020 328 (H) <200 mg/dL Final     HDL   Date Value Ref Range Status   07/07/2020 39 (L) > OR = 40 mg/dL Final     LDL   Date Value Ref Range Status   07/07/2020 241 (H) mg/dL (calc) Final     Comment:     LDL-C levels > or = 190 mg/dL may indicate familial   hypercholesterolemia (FH). Clinical assessment and   measurement of blood lipid levels should be   considered for all first degree relatives of   patients with an FH diagnosis.   For questions about testing for familial  hypercholesterolemia, please call Catering manager at [email or web address].  Wardell Honour, et al. J National Lipid Association   Recommendations for Patient-Centered Management of   Dyslipidemia: Part 1 Journal of Clinical Lipidology   2015;9(2), 129-169.  Reference range: <100     Desirable range <100 mg/dL for primary prevention;    <70 mg/dL for patients with CHD or diabetic patients   with > or = 2 CHD risk factors.     LDL-C is now calculated using the Martin-Hopkins   calculation, which is a validated novel method providing   better accuracy than the Friedewald equation in the   estimation of LDL-C.   Horald Pollen et al. Lenox Ahr. 1478;295(62): 2061-2068   (http://education.[facility].com/faq/FAQ164)       Triglycerides   Date Value Ref Range Status   07/07/2020 270 (H) <150 mg/dL Final     Comment:        If a non-fasting specimen was collected, consider  repeat triglyceride testing on a fasting specimen  if clinically indicated.   Perry Mount et al. J. of Clin. Lipidol. 2015;9:129-169.           Blood Sugar Hemoglobin A1C   Date Value Ref Range Status   07/07/2020 9.4 (H) <5.7 % of total Hgb Final     Comment:     For someone without known diabetes, a hemoglobin A1c  value of 6.5% or greater indicates that they may have   diabetes and this should be confirmed with a follow-up   test.     For someone with known diabetes, a value <7% indicates   that their diabetes is well controlled and a value   greater than or equal to 7% indicates suboptimal   control. A1c targets should be individualized based on   duration of diabetes, age, comorbid conditions, and   other considerations.     Currently, no consensus exists regarding use of  hemoglobin A1c for diagnosis of diabetes for children.      Test Performed at:  [facility]  [address]  [place]  [id number]  [place]       Glucose   Date Value Ref Range Status   07/07/2020 143 (H) 65 - 99 mg/dL Final     Comment:  Fasting reference interval     For someone without known diabetes, a glucose  value >125 mg/dL indicates that they may have  diabetes and this should be confirmed with a  follow-up test.        07/06/2020 182 (H) 70 - 100 MG/DL Final   09/81/1914 782 (H) 65 - 99 mg/dL Final     Comment:                   Fasting reference interval     For someone without known diabetes, a glucose  value >125 mg/dL indicates that they may have  diabetes and this should be confirmed with a  follow-up test.        03/14/2020 117 (H) 70 - 100 MG/DL Final   95/62/1308 657 (H) 70 - 100 MG/DL Final   84/69/6295 73 65 - 139 mg/dL Final     Comment:               Non-fasting reference interval          Glucose, POC   Date Value Ref Range Status   05/08/2019 101 (H) 70 - 100 MG/DL Final   28/41/3244 010 (H) 70 - 100 MG/DL Final   27/25/3664 403 (H) 70 - 100 MG/DL Final          Problems Addressed Today  Encounter Diagnoses   Name Primary?   ? Preop cardiovascular exam Yes       Assessment and Plan              Current Medications (including today's revisions)  ? blood sugar diagnostic (ONETOUCH ULTRA TEST) test strip Use one strip as directed daily before breakfast. Diagnosis Code: Uncontrolled DM-2   ? blood-glucose meter (ACCU-CHEK GUIDE GLUCOSE METER) kit Use one strip as directed daily. Diagnosis Code: DM-2   ? carvediloL (COREG) 6.25 mg tablet Take one tablet by mouth twice daily with meals. Take with food.   ? cholecalciferol(+) (VITAMIN D3) 2,000 unit tablet Take one tablet by mouth daily.   ? cyanocobalamin (VITAMIN B-12) 1,000 mcg tablet Take one tablet by mouth daily.   ? fish oil /omega-3 fatty acids (SEA-OMEGA) 340/1000 mg capsule Take 1 capsule by mouth daily.   ? furosemide (LASIX) 20 mg tablet TAKE 1 TABLET BY MOUTH EVERY DAY AS NEEDED   ? lancets MISC Use one each as directed daily. Diag Code: DM-2   ? Leg Brace misc Bilateral Fitted leg brace and right shoe for foot drop.  Dx: spina bifida.   ? levETIRAcetam (KEPPRA) 500 mg tablet Take one tablet by mouth twice daily.   ? losartan (COZAAR) 100 mg tablet Take one tablet by mouth daily.   ? Miscellaneous Medical Supply misc Shoe lift for right foot.  Dx: spina bifida   ? multivitamin with iron (MULTIPLE VITAMINS WITH IRON PO) Take 1 tablet by mouth daily.   ? Doctor, general practice 1 piece Urostomy bag.  Use 1 bag as needed.   ? rosuvastatin (CRESTOR) 20 mg tablet Take one tablet by mouth daily.   ? SITagliptin (JANUVIA) 100 mg tablet Take one tablet by mouth daily.   ? spironolactone (ALDACTONE) 25 mg tablet Take two tablets by mouth daily. Take with food.   ? triamcinolone acetonide (TRIDERM) 0.1 % topical cream Apply  topically to affected area twice daily.

## 2020-11-04 NOTE — Progress Notes
Heart Failure/Transplant Clinic Note  Date of Service: 11/04/20    Chief Complaint:  Follow Up           History of Present Illness:  30 year old gentleman with history of heart failure with improved ejection fraction from 25-65 original diagnosed as systolic dysfunction with sepsis in ICU in 2017.  He has significant past medical history which includes spina bifida with hydrocephalus, seizure disorder, neurogenic bladder, obesity and hypertension.  He used to follow-up with Dr. Ames Dura, Ivory Broad and Dr. Pierre Bali.  His most recent echocardiogram February 27, 2019 showed normal LV size and function.    Patient reports feeling well.  He is hesitant to start taking any GLP-1 agonist as he thinks exercise and reduction in calorie intake will help him lose weight.  His appetite is excellent.  He denies any lower extremity edema.  His primary care physician canceled his wisdom tooth removal due to poorly controlled diabetes and obesity.    Review of Systems   Constitutional: Positive for fatigue.   HENT: Negative.    Eyes: Negative.    Respiratory: Positive for shortness of breath. Negative for apnea, choking and chest tightness.    Cardiovascular: Negative.  Negative for chest pain, palpitations and leg swelling.   Gastrointestinal: Negative.    Endocrine: Negative.    Genitourinary: Negative.    Musculoskeletal: Negative.    Allergic/Immunologic: Negative.    Neurological: Negative.    Hematological: Negative.    Psychiatric/Behavioral: Negative.        Past Medical History:   Patient Active Problem List    Diagnosis Date Noted   ? Complex care coordination 09/14/2016     Priority: High   ? Type 2 diabetes mellitus with hyperglycemia (HCC) 10/02/2020   ? Type 2 diabetes mellitus without complication, without long-term current use of insulin (HCC) 07/29/2020   ? Visit for wound check 08/07/2019   ? Neurogenic bowel 06/12/2019   ? Nephrolithiasis 06/07/2019   ? Hypotension 05/02/2019   ? Aspiration into respiratory tract 05/02/2019   ? GERD (gastroesophageal reflux disease) 05/02/2019   ? Right nephrolithiasis 05/02/2019   ? Hypokalemia 05/02/2019   ? Hypomagnesemia 05/02/2019   ? Hyperglycemia 05/02/2019   ? Leukocytosis 05/02/2019   ? Fever 05/02/2019   ? Small bowel obstruction (HCC) 11/28/2018   ? Chronic combined systolic (congestive) and diastolic (congestive) heart failure (HCC) 11/24/2018   ? UTI (urinary tract infection) 03/10/2018   ? Pyelonephritis 04/13/2017   ? Neurogenic bladder 12/16/2016   ? 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 Non-ischemic cardiomyopathy (HCC) 09/29/2016   ? Essential hypertension 07/30/2016   ? Urethral stone 07/31/2015     Added automatically from request for surgery [phone]     ? Morbid obesity (HCC) 05/19/2015   ? Seizure disorder (HCC) 05/19/2015   ? Transaminitis 05/19/2015   ? Hydrocephalus (HCC) 04/28/2015   ? Kidney stone 02/14/2012     [place] 12/27/11: The kidneys are normal in size. The right kidney measures 11.0 x 5.5 cm and contains a large calculus in the mid to inferior pelvis, measuring 4.8 cm x  0.6 cm x 1.5 cm. There is mild pelviectasis which fails to resolve on post void imaging. The left kidney measures 11.7 x 5.4 cm. No renal masses are identified.  02/28/12 CT showed right staghorn calculus and a small left upper pole stone.  04/07/12 right PCNL - stable post op course for 2 days and discharged. Readmitted with hypotension and respiratory distress and  acute drop in H/H. No PE and pleural effusions. No signs of bleeding/no perinephric bleeding. Sepsis w/u neg but placed on erapenum. Second look dealyed until 04/21/12 - successful and dishcaged to [facility]  06/09/12 Recovered fully and now for metabolic w/u               ? 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 Arnold-Chiari malformation (HCC) 11/27/2009   ? ADD (attention deficit disorder) 10/28/2006   ? Spina bifida (HCC) 10/28/2006   ? Unspecified constipation 10/28/2006        Surgical History   Surgical History:   Procedure Laterality Date   ? SHUNT CREATION VENTRICULAR PERITONEAL Right 06/14/2014    Performed by Angelia Mould, MD at [facility] OR   ? SHUNT REVISION VENTRICULAR PERITONEAL Right 04/23/2015    Performed by Angelia Mould, MD at [facility] OR   ? REMOVAL HARDWARE HEAD: removal of left ventriculopleural shunt Left 05/20/2015    Performed by Angelia Mould, MD at [facility] OR   ? SHUNT REMOVAL VENTRICULAR PERITONEAL Right 05/23/2015    Performed by Angelia Mould, MD at [facility] OR   ? SHUNT CREATION VENTRICULAR PERITONEAL Left 06/03/2015    Performed by Angelia Mould, MD at [facility] OR   ? CYSTOURETHROSCOPY, CYSTOLITHOLAPAXY N/A 08/05/2015    Performed by Pennie Banter, MD at [facility] OR   ? HOLMIUM LASER ENUCLEATION OF PROSTATE (NO MORCELLATION) N/A 08/19/2015    Performed by Vonna Drafts, MD at [facility] OR   ? CYSTOSCOPY N/A 08/19/2015    Performed by Vonna Drafts, MD at [facility] OR   ? CYSTOSCOPY EVACUATION CLOTS N/A 08/29/2015    Performed by Vonna Drafts, MD at [facility] OR   ? CYSTOSCOPY, URETHERAL DILATION N/A 02/20/2016    Performed by Vonna Drafts, MD at [facility] OR   ? CYSTECTOMY, ILEAL CONDUIT N/A 10/13/2016    Performed by Glennie Isle, MD at [facility] OR   ? REVISION SHUNT - VENTRICULO-PERITONEAL Left 12/16/2016    Performed by Storm Frisk, MD at [facility] OR   ? CREATION SHUNT - VENTRICULO-PERITONEAL: Left side. 2 hours, proximal catheter and valve in place. will need distal catheter placed, Dr. Joan Flores to complete. Supine Left 12/22/2016    Performed by Angelia Mould, MD at [facility] OR   ? PR REPRGRMG PROGRAMMABLE CEREBROSPINAL SHUNT  01/07/2017   ? PERCUTANEOUS NEPHROSTOLITHOTOMY/ PYELOSTOLITHOTOMY - GREATER THAN 2 CM Left 05/25/2017    Performed by Clarita Crane, MD, [facility] OR   ? CREATION SHUNT - VENTRICULO-PERITONEAL Right 08/04/2017    Performed by Angelia Mould, MD at [facility] OR   ? PERCUTANEOUS NEPHROSTOLITHOTOMY/ PYELOSTOLITHOTOMY - 2 CM OR LESS Right 06/07/2019    Performed by Beckie Busing, MD at [facility] OR   ? CYSTOURETHROSCOPY WITH URETEROSCOPY AND/ OR PYELOSCOPY - WITH REMOVAL/ MANIPULATION CALCULUS Right 06/07/2019    Performed by Beckie Busing, MD at [facility] OR   ? CATHETER IMPLANT/REVISION  12/27/09    distal end of the catheter was revised   ? CATHETER IMPLANT/REVISION  01/31/10    replacement of ventricular catheter with [employer] framelessstereotaxis catheter   ? HX ABDOMEN SURGERY      fundoplication   ? HX ABDOMEN SURGERY  01/2001    Cecostomy   ? HX BACK SURGERY      repair of spina bifida   ? HX BRAIN SURGERY  5 months old    Chiari decompression   ? HX EAR TUBES     ? HX HERNIA  REPAIR      inguinal hernia   ? HX SURGERY  at 2 weeks    VP Shunt   ? HX TONSILLECTOMY     ? HX TRACHEOSTOMY     ? SHUNT REVISION  3 months old   ? SHUNT REVISION  30 years old   ? SHUNT REVISION  October 2010    [place]   ? SHUNT REVISION  12/11/09    replacment of valve to acodman hakem adjustablevalve   ? SHUNT REVISION  02/17/10    left frontal ventriculopleural shunt   ? VENTRICULOSTOMY  02/03/10    removal of all shunt components and placementofright frontal ventriculostomy        Family History:   family history includes Diabetes in his father; High Cholesterol in his maternal grandfather; Hypertension in his father; Other in his father.    Social History:   reports that he has never smoked. He has never used smokeless tobacco. He reports that he does not drink alcohol and does not use drugs.    Allergy:  Allergies   Allergen Reactions   ? Latex RASH and SHORTNESS OF BREATH   ? Amoxicillin RASH and STOMACH UPSET     08/03/17 discussed this w/ patient. Amox/clav in 2014. He certainly took it.  Notes from that time indicate diarrhea. Discussed this with him on 03/13/18 and he stated that he'd taken amox 3 times in past and each time had HA, debilitating fatigue and diarrhea. And maybe rash. At this time he was tolerating IV ampicillin. Manya Silvas, MD 03/13/18  Update Luchi MD, 11/28/18: He got IV ampicillin at [facility] 2/29 to 03/15/18 w/o rash or SE. He was started on Augmentin 05/08/19 without significant SE   ? Ceclor [Cefaclor] HIVES     Pt has tolerated ancef, keflex, ceftriaxone and cefepime (many prescriptions for these in med review in [place].   ? Clindamycin HIVES   ? Zosyn [Piperacillin-Tazobactam] HIVES        Medication:  ? blood sugar diagnostic (ONETOUCH ULTRA TEST) test strip Use one strip as directed daily before breakfast. Diagnosis Code: Uncontrolled DM-2   ? blood-glucose meter (ACCU-CHEK GUIDE GLUCOSE METER) kit Use one strip as directed daily. Diagnosis Code: DM-2   ? carvediloL (COREG) 6.25 mg tablet Take one tablet by mouth twice daily with meals. Take with food.   ? cholecalciferol(+) (VITAMIN D3) 2,000 unit tablet Take one tablet by mouth daily.   ? cyanocobalamin (VITAMIN B-12) 1,000 mcg tablet Take one tablet by mouth daily.   ? fish oil /omega-3 fatty acids (SEA-OMEGA) 340/1000 mg capsule Take 1 capsule by mouth daily.   ? furosemide (LASIX) 20 mg tablet TAKE 1 TABLET BY MOUTH EVERY DAY AS NEEDED   ? lancets MISC Use one each as directed daily. Diag Code: DM-2   ? Leg Brace misc Bilateral Fitted leg brace and right shoe for foot drop.  Dx: spina bifida.   ? levETIRAcetam (KEPPRA) 500 mg tablet Take one tablet by mouth twice daily.   ? losartan (COZAAR) 100 mg tablet Take one tablet by mouth daily.   ? Miscellaneous Medical Supply misc Shoe lift for right foot.  Dx: spina bifida   ? multivitamin with iron (MULTIPLE VITAMINS WITH IRON PO) Take 1 tablet by mouth daily.   ? Doctor, general practice 1 piece Urostomy bag.  Use 1 bag as needed.   ? rosuvastatin (CRESTOR) 20 mg tablet Take one tablet by mouth daily.   ?  SITagliptin (JANUVIA) 100 mg tablet Take one tablet by mouth daily.   ? spironolactone (ALDACTONE) 25 mg tablet Take two tablets by mouth daily. Take with food.   ? triamcinolone acetonide (TRIDERM) 0.1 % topical cream Apply  topically to affected area twice daily.       Physical Exam  Vitals: BP 131/69 (BP Source: Arm, Left Upper, Patient Position: Sitting)  - Pulse 92  - Ht 162.6 cm (5' 4)  - Wt 124.6 kg (274 lb 9.6 oz)  - SpO2 97%  - BMI 47.13 kg/m?    General:  Oriented x 3, no acute distress, pleasant  Head/EYES/ENT: atramatic, normal, sclera are not icteric   Neck: JVP is flat, no bruits  Cardiovascular:  Normal rate, regular, no murmurs  Lungs: clear to auscultation bilaterally, no wheezes, no crackles, good breath sounds to bases  Abdomen: soft, non distended, positive bowel sounds, no hepatospleenomegaly  Extremities: warm, perfused, no clubbing or cyanosis, no edema  Neurological: grossly intact, no focal deficits, moves all extremities equally   Psych:  Appropriate, mood good, oriented  Skin: some bruises, dry, no sings of infection, no tatoos    Labs and Other Findings:  CMP  Sodium (mmol/L)   Date Value   07/07/2020 138     Potassium (mmol/L)   Date Value   07/07/2020 4.4     Chloride (mmol/L)   Date Value   07/07/2020 98     CO2 (mmol/L)   Date Value   07/07/2020 31     Anion Gap (no units)   Date Value   07/06/2020 11     Blood Urea Nitrogen (mg/dL)   Date Value   16/10/9602 13     Creatinine (mg/dL)   Date Value   54/09/8117 0.62     Glucose (mg/dL)   Date Value   14/78/2956 143 (H)     Calcium (mg/dL)   Date Value   21/30/8657 9.3     Phosphorus (MG/DL)   Date Value   84/69/6295 3.9     Albumin (G/DL)   Date Value   28/41/3244 4.2     Total Protein (G/DL)   Date Value   01/13/7251 7.4     Alk Phosphatase (U/L)   Date Value   07/06/2020 83     AST (SGOT) (U/L)   Date Value   07/06/2020 49 (H)     ALT (SGPT) (U/L)   Date Value   07/06/2020 63 (H)     Total Bilirubin (MG/DL)   Date Value   66/44/0347 0.5     eGFR Non African American (mL/min/1.44m2)   Date Value   07/07/2020 132     eGFR African American (mL/min/1.83m2)   Date Value   07/07/2020 153     BMP:  Sodium (mmol/L)   Date Value   07/07/2020 138     Potassium (mmol/L)   Date Value   07/07/2020 4.4     Calcium (mg/dL)   Date Value   42/59/5638 9.3     Chloride (mmol/L)   Date Value   07/07/2020 98     CO2 (mmol/L) Date Value   07/07/2020 31     Anion Gap (no units)   Date Value   07/06/2020 11     Blood Urea Nitrogen (mg/dL)   Date Value   75/64/3329 13     Creatinine (mg/dL)   Date Value   51/88/4166 0.62     Glucose (mg/dL)   Date Value   07/11/1599  143 (H)     CBC:    Sodium (mmol/L)   Date Value   07/07/2020 138     Potassium (mmol/L)   Date Value   07/07/2020 4.4     Chloride (mmol/L)   Date Value   07/07/2020 98     Glucose (mg/dL)   Date Value   16/10/9602 143 (H)     Blood Urea Nitrogen (mg/dL)   Date Value   54/09/8117 13     Creatinine (mg/dL)   Date Value   14/78/2956 0.62     Calcium (mg/dL)   Date Value   21/30/8657 9.3     Albumin (G/DL)   Date Value   84/69/6295 4.2     Alk Phosphatase (U/L)   Date Value   07/06/2020 83     AST (SGOT) (U/L)   Date Value   07/06/2020 49 (H)     CO2 (mmol/L)   Date Value   07/07/2020 31     ALT (SGPT) (U/L)   Date Value   07/06/2020 63 (H)     Anion Gap (no units)   Date Value   07/06/2020 11     eGFR Non African American (mL/min/1.20m2)   Date Value   07/07/2020 132     eGFR African American (mL/min/1.20m2)   Date Value   07/07/2020 153     Thyroid: TSH  TSH (MCU/ML)   Date Value   12/16/2016 1.970     Hgb A1c:   Hemoglobin A1C (% of total Hgb)   Date Value   07/07/2020 9.4 (H)     ANC Panel  Absolute Neutrophil Count (K/UL)   Date Value   07/06/2020 6.12     BNP:  B Type Natriuretic Peptide (pg/mL)   Date Value   07/07/2020 8     Uric Acid:  Uric Acid (MG/DL)   Date Value   28/41/3244 6.0        These labs were in your AV Clinic Note already but you did not mention keeping them.  Do you want them deleted?  Creatinine (mg/dL)   Date Value   01/13/7251 0.62     Color,UA (no units)   Date Value   05/01/2019 YELLOW     Turbidity,UA (no units)   Date Value   05/01/2019 2+ (A)     Specific Gravity-Urine (no units)   Date Value   05/01/2019 >1.050 (H)     pH,UA (no units)   Date Value   05/01/2019 6.0     Protein,UA (no units)   Date Value   05/01/2019 1+ (A)     Glucose,UA (no units)   Date Value   05/01/2019 NEG     Ketones,UA (no units)   Date Value   05/01/2019 NEG     Bilirubin,UA (no units)   Date Value   05/01/2019 NEG     Blood,UA (no units)   Date Value   05/01/2019 3+ (A)     Urobilinogen,UA (no units)   Date Value   05/01/2019 NORMAL     Nitrite,UA (no units)   Date Value   05/01/2019 POS (A)     Leukocytes,UA (no units)   Date Value   05/01/2019 3+ (A)     Urine Ascorbic Acid, UA (no units)   Date Value   05/01/2019 NEG     Prostate:   No results found for: PSA  ABG:  pH-Arterial (no units)   Date Value   05/03/2019 7.51 (H)  pCO2-Arterial (MMHG)   Date Value   05/03/2019 29 (L)     pO2-Arterial (MMHG)   Date Value   05/03/2019 108 (H)     Base Deficit-Arterial (MMOL/L)   Date Value   05/02/2019 2.9     O2 Sat-Arterial (%)   Date Value   05/03/2019 98.7     Bicarbonate-ART-Cal (MMOL/L)   Date Value   05/03/2019 25.3     LFTS:  APTT (SEC)   Date Value   08/29/2015 26.6     INR (no units)   Date Value   12/16/2016 1.1     Cholesterol (mg/dL)   Date Value   16/10/9602 328 (H)     Triglycerides (mg/dL)   Date Value   54/09/8117 270 (H)     HDL (mg/dL)   Date Value   14/78/2956 39 (L)     LDL (mg/dL (calc))   Date Value   07/07/2020 241 (H)     VLDL (MG/DL)   Date Value   21/30/8657 45     Non HDL Cholesterol (mg/dL (calc))   Date Value   07/07/2020 289 (H)         Echo Results:  Results for orders placed during the hospital encounter of 02/27/19    2-D + DOPPLER ECHOCARDIOGRAM    Narrative  ? Technically difficult study; i.v. transpulmonary contrast was used to define the endocardial borders.  ? Normal left ventricular systolic function, estimated ejection fraction is 65%.  ? There are no segmental wall motion abnormalities.  ? Trace mitral and tricuspid valve regurgitation.  ? Pulmonary artery pressure could not be calculated by this study due to incomplete TR signal.    Nuclear Stress Test   This study is abnormal.  Left ventricular systolic function is markedly reduced. There is diffuse left ventricular hypokinesis with near akinesis of the inferolateral segment.  There does not appear to be evidence of significant active inducible ischemia.  Other than the reduced ejection fraction no other negative prognostic indicators are present.  Viability appears to be preserved throughout the myocardium      Assessment/Plan:  Heart failure with improved ejection fraction  Spina bifida with hydrocephalus  Morbid obesity  Hypertension    Plan:  At this time patient is on excellent goal-directed medical therapy which consists of carvedilol 6.25 mg p.o. twice daily, losartan 100 mg p.o. daily, spironolactone 25 mg twice daily. We will increase his carvedilol 12.5 mg p.o. twice daily.  We will repeat his labs today.  He will follow-up in clinic with [place] in 12 months with echocardiogram.  At this time patient refuses GLP-1 agonist.  He would like to try exercise and change his diet to lose weight  Total time 32 minutes.  Estimated counseling time 17 minutes.  Counseled patient regarding need to lose weight.    Disposition:  Patient Instructions     Thank you for coming to [facility].                                                  Treatment Plan     1. Medication Changes:      2.   Lab Orders:      3.  Tests/ Procedures:      4.   Other:      5.  Follow Up  Appointments:          In order to provide you the best care possible we ask that you follow up as outlined below:     For NON-URGENT questions please contact us through your MyChart account.   For all medication refills please contact your pharmacy or send a request through MyChart.   For all questions that may need to be addressed urgently please call the nursing triage line at [phone] Monday - Friday 8 AM-5 PM only. Please leave a detailed message with your name, date of birth, and reason for your call.  If calling outside business hours please call our on-call provider at [phone]  To schedule an appointment or to make changes to your appointments please call [phone].   Please allow 10 business days for the results of any testing to be reviewed. Please call our office if you have not heard from a nurse within this time frame.       Lab and test results:  As a part of the CARES act, starting 04/12/2019, some results will be released to you via mychart immediately and automatically.  You may see results before your provider sees them; however, your provider will review all these results and then they, or one of their team, will notify you of result information and recommendations.   Critical results will be addressed immediately, but otherwise, please allow [place] time to get back with you prior to you reaching out to [place] for questions.  This will usually take about 72 hours for labs and 5-7 days for procedure test results.      Kathleen Argue, MD  APRN: Satira Anis APRN,  Fara Boros APRN, Eyvonne Left APRN  Nursing Team Silver: Leafy Ro RN, Mick Sell RN, [facility]  Phone: [phone] Fax: [phone]    Your Heart Failure Symptom Awareness and Action Plan  Every Day Action Plan  ? Weigh yourself in the morning before breakfast. Write it down and compare it to yesterday's weight.  ? Take your medicine, as prescribed. Please call if you have concerns about the side effects, cost or refills.  ? Check for worsened swelling in your feet, ankles and stomach  ? Follow a 2000mg  salt diet.  ? Keep all healthcare appointments    Green Zone   Good! Symptoms are under control ? No shortness of breath  ? No increase in ankle swelling  ? No weight gain  ? No chest pain  ? No change in your usual activity  ? Continue to follow your everyday action plan.    Yellow Zone  If you have any of these symptoms, please call the heart failure nurses: [phone] ? Increased shortness of breath with activity  ? Weight gain of 3 pounds in one day or 5 pounds in a week  ? Increased swelling in your ankles or legs  ? Increased swelling in your stomach  ? Increasing fatigue  ? You may need an adjustment of your medications.    Red Zone  These are urgent symptoms. Please call the heart failure nurses: [phone] ? Shortness of breath at rest or waking up at night feeling short of breath or coughing  ? Increased number of pillows used or needing to sit upright to sleep  ? Chest tightness at rest  ? Dizziness, lightheadedness or feeling faint You need to schedule an appointment   Emergency Zone - call 911 ?  Worsening chest tightness or pain that is not relieved by medication  ? Severe shortness of breath and a cough with pink, frothy sputum       For up to date information on the COVID-19 virus, visit the [facility] website.  ? General supportive care during cold and flu season and infection prevention reminders:   o Wash hands often with soap and water for at least 20 seconds  o Cover your mouth and nose  o Stay home if sick and symptoms mild or manageable  - If you must be around people wear a mask    ? If you are having symptoms of a lower respiratory infection (cough, shortness of breath) and/or fever AND either traveled in last 30 days (internationally or to region of exposure) OR known exposure to patient with COVID19:    o Call your primary care provider for questions or health needs.   - Tell your doctor about your recent travel and your symptoms    o In a medical emergency, call 911 or go to the nearest emergency room.           Kathleen Argue, DO   Advanced Heart Failure and Transplant Cardiology  [facility]

## 2020-11-11 ENCOUNTER — Encounter: Admit: 2020-11-11 | Discharge: 2020-11-11 | Payer: MEDICARE

## 2020-11-11 NOTE — Telephone Encounter
Received a call from [facility] with oral surgery [place].  Revonda Standard states that she faxed over a medical clearance form to our office in September, because patient needs wisdom teeth removal. Patient has several conditions that may increase the risks of this procedure.    Revonda Standard states she did not receive a fax back from our office. She is requesting a signed letter from Dr. Larina Bras stating that it would be safe for patient to have his wisdom teeth removed. Revonda Standard did not provide a fax number.      Dr. Larina Bras, are you willing to write this letter?    Avelina Laine, RN

## 2020-11-20 ENCOUNTER — Encounter: Admit: 2020-11-20 | Discharge: 2020-11-20 | Payer: MEDICARE

## 2020-11-20 ENCOUNTER — Ambulatory Visit: Admit: 2020-11-20 | Discharge: 2020-11-20 | Payer: MEDICARE

## 2020-11-24 ENCOUNTER — Encounter: Admit: 2020-11-24 | Discharge: 2020-11-24 | Payer: MEDICARE

## 2020-12-02 ENCOUNTER — Encounter: Admit: 2020-12-02 | Discharge: 2020-12-02 | Payer: MEDICARE

## 2020-12-02 NOTE — Progress Notes
CARE MANAGEMENT OUTREACH    Attempted to contact patient for Care Management outreach. Left voicemail message with callback information. Will try again at a later time.

## 2020-12-05 ENCOUNTER — Encounter: Admit: 2020-12-05 | Discharge: 2020-12-05 | Payer: MEDICARE

## 2020-12-05 MED ORDER — FUROSEMIDE 20 MG PO TAB
ORAL_TABLET | Freq: Every day | 0 refills | PRN
Start: 2020-12-05 — End: ?

## 2020-12-08 ENCOUNTER — Encounter: Admit: 2020-12-08 | Discharge: 2020-12-08 | Payer: MEDICARE

## 2020-12-08 MED ORDER — LANCETS MISC MISC
1 | Freq: Every day | 3 refills | Status: AC
Start: 2020-12-08 — End: ?

## 2021-01-19 ENCOUNTER — Encounter: Admit: 2021-01-19 | Discharge: 2021-01-19 | Payer: MEDICARE

## 2021-01-20 ENCOUNTER — Encounter: Admit: 2021-01-20 | Discharge: 2021-01-20 | Payer: MEDICARE

## 2021-01-21 ENCOUNTER — Encounter: Admit: 2021-01-21 | Discharge: 2021-01-21 | Payer: MEDICARE

## 2021-01-21 MED FILL — LANCETS 33 GAUGE MISC MISC: 33 gauge | 30 days supply | Qty: 100 | Fill #1 | Status: AC

## 2021-02-13 ENCOUNTER — Encounter: Admit: 2021-02-13 | Discharge: 2021-02-13 | Payer: MEDICARE

## 2021-02-13 NOTE — Progress Notes
General Risk Score 6  HCC Score 0.126  Program criteria met -     Reviewed chart. Spoke with patient who is getting ready to go to work.    Patient Reports:    1. Doing well on increased dose of Metformin. No side effects to report  2. Currently has a cold. No fever, drinking fluids. Declines appointment at this time. Will call if symptoms worsen or do not resolve.  3. Not currently monitoring blood pressure but states that heart rate has been good.     Patient Goals:    1.Diabetes under control  2.Increase activity    Barriers to Care:    1.Knowledge deficit  2.Chronic conditions    Care Plan:    1.Continue with new medication and report side effects  2.Increase activity as tolerated   3.Healthy diet - protein, vegetables and fruits  4. Instructed patient to call care manager any time with questions or concerns.   5. Routing note to PCP    LOV:    10/29/2020    Upcoming appointments include:    Future Appointments   Date Time Provider [employer]   02/25/2021 11:00 AM Tyron Russell, [facility]   04/28/2021  1:30 PM Joneen Caraway, MD [place]   07/29/2021  2:00 PM [facility]   07/29/2021  3:45 PM Harriette Ohara, MD [facility]   08/11/2021  2:00 PM Trilby Leaver, MD [place] [facility]       Next planned CM outreach (approximate):     4-6 weeks

## 2021-02-23 ENCOUNTER — Encounter: Admit: 2021-02-23 | Discharge: 2021-02-23 | Payer: MEDICARE

## 2021-02-25 ENCOUNTER — Encounter: Admit: 2021-02-25 | Discharge: 2021-02-25 | Payer: MEDICARE

## 2021-02-25 NOTE — Progress Notes
Attempted to contact Allen Gill for a scheduled comprehensive medication management appointment. No answer x 2. Left voicemail x 2 asking patient to return call to [phone] to reschedule. Will reschedule patient in the future.     Gardiner Ramus, PHARMD

## 2021-03-09 ENCOUNTER — Encounter: Admit: 2021-03-09 | Discharge: 2021-03-09 | Payer: MEDICARE

## 2021-03-10 ENCOUNTER — Encounter: Admit: 2021-03-10 | Discharge: 2021-03-10 | Payer: MEDICARE

## 2021-03-10 MED FILL — ONETOUCH ULTRA TEST MISC STRP: 50 days supply | Qty: 50 | Fill #2 | Status: CP

## 2021-03-10 MED FILL — LANCETS 33 GAUGE MISC MISC: 33 gauge | 90 days supply | Qty: 100 | Fill #2 | Status: CP

## 2021-03-12 ENCOUNTER — Encounter: Admit: 2021-03-12 | Discharge: 2021-03-12 | Payer: MEDICARE

## 2021-03-12 MED ORDER — METFORMIN 500 MG PO TB24
500 mg | ORAL_TABLET | Freq: Two times a day (BID) | ORAL | 1 refills | Status: AC
Start: 2021-03-12 — End: ?

## 2021-03-24 ENCOUNTER — Encounter: Admit: 2021-03-24 | Discharge: 2021-03-24 | Payer: MEDICARE

## 2021-03-24 DIAGNOSIS — I428 Other cardiomyopathies: Secondary | ICD-10-CM

## 2021-03-24 DIAGNOSIS — I10 Essential (primary) hypertension: Secondary | ICD-10-CM

## 2021-03-24 MED ORDER — SPIRONOLACTONE 25 MG PO TAB
50 mg | ORAL_TABLET | Freq: Every day | ORAL | 2 refills | 90.00000 days | Status: AC
Start: 2021-03-24 — End: ?

## 2021-03-25 ENCOUNTER — Encounter: Admit: 2021-03-25 | Discharge: 2021-03-25 | Payer: MEDICARE

## 2021-04-28 ENCOUNTER — Encounter: Admit: 2021-04-28 | Discharge: 2021-04-28 | Payer: MEDICARE

## 2021-04-28 DIAGNOSIS — E1165 Type 2 diabetes mellitus with hyperglycemia: Secondary | ICD-10-CM

## 2021-04-29 ENCOUNTER — Ambulatory Visit: Admit: 2021-04-29 | Discharge: 2021-04-29 | Payer: MEDICARE

## 2021-04-29 ENCOUNTER — Encounter: Admit: 2021-04-29 | Discharge: 2021-04-29 | Payer: MEDICARE

## 2021-04-29 DIAGNOSIS — E1165 Type 2 diabetes mellitus with hyperglycemia: Secondary | ICD-10-CM

## 2021-05-01 ENCOUNTER — Encounter: Admit: 2021-05-01 | Discharge: 2021-05-01 | Payer: MEDICARE

## 2021-05-01 NOTE — Progress Notes
CARE MANAGEMENT OUTREACH    Attempted to contact patient for Care Management outreach. Left voicemail message with callback information. Will try again at a later time.     Upcoming appointments include:    Future Appointments   Date Time Provider Department Center   05/20/2021  2:30 PM Hiram Gash, MD [facility]   07/29/2021  2:00 PM [facility]   07/29/2021  3:45 PM Beckie Busing, MD [facility]rology   08/11/2021  2:00 PM Maia Petties, MD [id number] Neurology       Next planned CM outreach (approximate):     4-6 weeks

## 2021-05-04 ENCOUNTER — Encounter: Admit: 2021-05-04 | Discharge: 2021-05-04 | Payer: MEDICARE

## 2021-05-04 NOTE — Telephone Encounter
Attempted to contact Allen Gill to reschedule an appointment with the primary care pharmacist per their primary care provider's request. Left voicemail asking patient to contact us at [phone] to set up an appointment.    Arman Filter  Primary Care Pharmacy Technician Coordinator   [phone]

## 2021-05-17 ENCOUNTER — Encounter: Admit: 2021-05-17 | Discharge: 2021-05-17 | Payer: MEDICARE

## 2021-05-18 ENCOUNTER — Encounter: Admit: 2021-05-18 | Discharge: 2021-05-18 | Payer: MEDICARE

## 2021-05-18 MED FILL — ONETOUCH ULTRA TEST MISC STRP: 50 days supply | Qty: 50 | Fill #3 | Status: CP

## 2021-05-20 ENCOUNTER — Encounter: Admit: 2021-05-20 | Discharge: 2021-05-20 | Payer: MEDICARE

## 2021-05-20 ENCOUNTER — Ambulatory Visit: Admit: 2021-05-20 | Discharge: 2021-05-20 | Payer: MEDICARE

## 2021-05-20 DIAGNOSIS — I10 Essential (primary) hypertension: Secondary | ICD-10-CM

## 2021-05-20 DIAGNOSIS — N319 Neuromuscular dysfunction of bladder, unspecified: Secondary | ICD-10-CM

## 2021-05-20 DIAGNOSIS — Q054 Unspecified spina bifida with hydrocephalus: Secondary | ICD-10-CM

## 2021-05-20 DIAGNOSIS — R339 Retention of urine, unspecified: Secondary | ICD-10-CM

## 2021-05-20 DIAGNOSIS — F988 Other specified behavioral and emotional disorders with onset usually occurring in childhood and adolescence: Secondary | ICD-10-CM

## 2021-05-20 DIAGNOSIS — Z01810 Encounter for preprocedural cardiovascular examination: Secondary | ICD-10-CM

## 2021-05-20 DIAGNOSIS — T85618A Breakdown (mechanical) of other specified internal prosthetic devices, implants and grafts, initial encounter: Secondary | ICD-10-CM

## 2021-05-20 DIAGNOSIS — Z936 Other artificial openings of urinary tract status: Secondary | ICD-10-CM

## 2021-05-20 DIAGNOSIS — G919 Hydrocephalus, unspecified: Secondary | ICD-10-CM

## 2021-05-20 DIAGNOSIS — E1165 Type 2 diabetes mellitus with hyperglycemia: Secondary | ICD-10-CM

## 2021-05-20 DIAGNOSIS — R000 Tachycardia, unspecified: Secondary | ICD-10-CM

## 2021-05-20 DIAGNOSIS — R51 Headache: Secondary | ICD-10-CM

## 2021-05-20 DIAGNOSIS — I5042 Chronic combined systolic (congestive) and diastolic (congestive) heart failure: Secondary | ICD-10-CM

## 2021-05-20 DIAGNOSIS — R569 Unspecified convulsions: Secondary | ICD-10-CM

## 2021-05-20 DIAGNOSIS — N200 Calculus of kidney: Secondary | ICD-10-CM

## 2021-05-20 DIAGNOSIS — N390 Urinary tract infection, site not specified: Secondary | ICD-10-CM

## 2021-05-20 DIAGNOSIS — I429 Cardiomyopathy, unspecified: Secondary | ICD-10-CM

## 2021-05-20 DIAGNOSIS — G40209 Localization-related (focal) (partial) symptomatic epilepsy and epileptic syndromes with complex partial seizures, not intractable, without status epilepticus: Secondary | ICD-10-CM

## 2021-05-20 DIAGNOSIS — Q0700 Arnold-Chiari syndrome without spina bifida or hydrocephalus: Secondary | ICD-10-CM

## 2021-05-20 DIAGNOSIS — Q059 Spina bifida, unspecified: Secondary | ICD-10-CM

## 2021-05-20 DIAGNOSIS — G4733 Obstructive sleep apnea (adult) (pediatric): Secondary | ICD-10-CM

## 2021-05-20 NOTE — Progress Notes
Chief Complaint   Patient presents with   ? Physical          Subjective:       History of Present Illness  Allen Gill is a 31 y.o. male.  Patient presents to clinic for follow-up.  He also has paperwork that needs to be filled out for his assistance.  Patient states he is feeling well but he is frustrated because once he started his job he started eating out every day.  He is not exercising at home with his bike.  Patient states now that he is not working, he plans to eat more healthy at home.  He also states he can use his bike while he is watching TV.  Patient would like to lose weight.  He does not want to take Ozempic if at all possible.  He is following up with cardiology in October and states that it was recommended at that appointment to consider starting Ozempic.  He is otherwise doing well and has no concerns.          Review of Systems   Constitutional: Positive for weight gain. Negative for fever and malaise/fatigue.   HENT: Negative for congestion.    Eyes: Negative for blurred vision.   Cardiovascular: Negative for chest pain and dyspnea on exertion.   Respiratory: Negative for cough and shortness of breath.    Skin: Negative for rash.   Musculoskeletal: Positive for myalgias. Negative for back pain.   Gastrointestinal: Negative for abdominal pain, constipation, diarrhea, heartburn and nausea.   Neurological: Negative for headaches.   Psychiatric/Behavioral: Negative for depression. The patient is not nervous/anxious.           Objective:         ? blood sugar diagnostic (ONETOUCH ULTRA TEST) test strip Use one strip as directed daily before breakfast. Diagnosis Code: Uncontrolled DM-2   ? blood-glucose meter (ACCU-CHEK GUIDE GLUCOSE METER) kit Use one strip as directed daily. Diagnosis Code: DM-2   ? carvediloL (COREG) 6.25 mg tablet Take two tablets by mouth twice daily with meals. Take with food.   ? cephalexin (KEFLEX) 500 mg capsule TAKE 1 CAPSULE BY MOUTH EVERY 8 HOURS FOR 7 DAYS   ? cholecalciferol(+) (VITAMIN D3) 2,000 unit tablet Take one tablet by mouth daily.   ? cyanocobalamin (VITAMIN B-12) 1,000 mcg tablet Take one tablet by mouth daily.   ? fish oil /omega-3 fatty acids (SEA-OMEGA) 340/1000 mg capsule Take one capsule by mouth daily.   ? furosemide (LASIX) 20 mg tablet Take one tablet by mouth daily as needed.   ? lancets 33 gauge 33 gauge Use one each as directed daily.   ? Leg Brace misc Bilateral Fitted leg brace and right shoe for foot drop.  Dx: spina bifida.   ? levETIRAcetam (KEPPRA) 500 mg tablet Take one tablet by mouth twice daily.   ? losartan (COZAAR) 100 mg tablet Take one tablet by mouth daily.   ? metFORMIN-XR (GLUCOPHAGE XR) 500 mg extended release tablet Take one tablet by mouth twice daily with meals.   ? Miscellaneous Medical Supply misc Shoe lift for right foot.  Dx: spina bifida   ? multivitamin with iron (MULTIPLE VITAMINS WITH IRON PO) Take 1 tablet by mouth daily.   ? Doctor, general practice 1 piece Urostomy bag.  Use 1 bag as needed.   ? rosuvastatin (CRESTOR) 20 mg tablet Take one tablet by mouth daily.   ? SITagliptin (JANUVIA) 100 mg tablet  Take one tablet by mouth daily.   ? spironolactone (ALDACTONE) 25 mg tablet Take two tablets by mouth daily. Take with food   ? triamcinolone acetonide (TRIDERM) 0.1 % topical cream Apply  topically to affected area twice daily.     Vitals:    05/20/21 1424 05/20/21 1440 05/20/21 1442   BP: 100/60 102/68 102/68   BP Source:  Arm, Left Upper    Pulse: 62     Temp: 36.6 ?C (97.8 ?F)     SpO2: 95%     PainSc: Zero     Weight: 122.9 kg (271 lb)     Height: 160 cm (5' 2.99)       Body mass index is 48.02 kg/m?Marland Kitchen       Physical Exam  General: NAD, A& O x3  CV: RRR no murmur  Pulm: CTA BIL  Psych: Normal mood and affect       Assessment and Plan:  1. Essential hypertension  Controlled.  Continue current meds    2. Type 2 diabetes mellitus with hyperglycemia, without long-term current use of insulin (HCC)  Uncontrolled. Recommend regular exercise and dietary changes.  Patient to continue current meds.  He would benefit most likely from Ozempic but is hesitant to do that at this time.    3. 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  Stable    4. Spina bifida with hydrocephalus, unspecified spinal region (HCC)  Stable.  Filled out paperwork.  Continue meds and follow with neurosurgery    5. Hydrocephalus, unspecified type (HCC)  Stable.  Continue to follow with neurosurgery    6. Chronic combined systolic (congestive) and diastolic (congestive) heart failure (HCC)  Stable.  Continue current meds.  Would benefit from significant weight loss.  Patient follows with cardiology    7. Morbid obesity (HCC)  BMI is 48.  Recommend weight loss.  Patient is going to try to exercise daily and eat less fast food    8. 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  Continue current meds                   Monitored today and my assessment is that the current treatment is appropriate.       At least 50% of visit was spent in counseling and coordination of care.

## 2021-05-25 ENCOUNTER — Encounter: Admit: 2021-05-25 | Discharge: 2021-05-25 | Payer: MEDICARE

## 2021-06-15 ENCOUNTER — Encounter: Admit: 2021-06-15 | Discharge: 2021-06-15 | Payer: MEDICARE

## 2021-06-24 ENCOUNTER — Encounter: Admit: 2021-06-24 | Discharge: 2021-06-24 | Payer: MEDICARE

## 2021-06-25 ENCOUNTER — Encounter: Admit: 2021-06-25 | Discharge: 2021-06-25 | Payer: MEDICARE

## 2021-06-27 ENCOUNTER — Encounter: Admit: 2021-06-27 | Discharge: 2021-06-27 | Payer: MEDICARE

## 2021-06-29 ENCOUNTER — Encounter: Admit: 2021-06-29 | Discharge: 2021-06-29 | Payer: MEDICARE

## 2021-06-29 MED FILL — ONETOUCH ULTRA TEST MISC STRP: 50 days supply | Qty: 50 | Fill #4 | Status: CP

## 2021-07-13 ENCOUNTER — Encounter: Admit: 2021-07-13 | Discharge: 2021-07-13 | Payer: MEDICARE

## 2021-07-13 DIAGNOSIS — E119 Type 2 diabetes mellitus without complications: Secondary | ICD-10-CM

## 2021-07-13 MED ORDER — TRULICITY 0.75 MG/0.5 ML SC PNIJ
1 refills | Status: AC
Start: 2021-07-13 — End: ?

## 2021-07-17 ENCOUNTER — Encounter: Admit: 2021-07-17 | Discharge: 2021-07-17 | Payer: MEDICARE

## 2021-07-21 ENCOUNTER — Ambulatory Visit: Admit: 2021-07-21 | Discharge: 2021-07-21 | Payer: MEDICARE

## 2021-07-21 ENCOUNTER — Encounter: Admit: 2021-07-21 | Discharge: 2021-07-21 | Payer: MEDICARE

## 2021-07-21 DIAGNOSIS — E119 Type 2 diabetes mellitus without complications: Secondary | ICD-10-CM

## 2021-07-21 MED ORDER — TRULICITY 1.5 MG/0.5 ML SC PNIJ
1.5 mg | SUBCUTANEOUS | 1 refills | Status: AC
Start: 2021-07-21 — End: ?

## 2021-07-21 NOTE — Progress Notes
A follow-up comprehensive medication management visit was completed today via telephone.    Referral reason: Diabetes Management  Referring provider: Orson Gear, MD    Assessment & Plan     Diabetes  Patient's diabetes is uncontrolled as reflected by home blood sugars and A1C. Their goal glucose is pre-prandial 80-130 and post-prandial <180. with a goal of < 7%    Reported CBGs not at goal. Discussed with patient will increase dose of Trulicity at this time, which he is agreeable to changing. Will continue working on cutting down soda intake.     Plan  - INCREASE Trulicity 1.5 mg once a week on Tuesday. Can use up current 0.75 mg pen supply   - Continue metformin XR 500 mg twice daily      Follow-up  The patient will continue to follow up with the pharmacist. Return to pharmacist in 4 weeks via telephone. The return visit was scheduled during today's visit.    Subjective & Objective   At the last visit, the patient was instructed to continue current regimen.   Confirmed he is tolerating his Trulicity and metformin well.   Has only missed one dose of metformin since he started.   Has never missed a dose of Trulicity. Still has 3 pens left.   Checks CBGs every morning. Noticed that his fasting BGs would be in the 140s at 7-8 AM, but if he waits until 10-11 to check then it will be lower at 120s.   Usually goes to bed at around 10-11 PM.   Working on cutting down soda intake. Working on limiting to just one a day.     Diabetes    HPI  Current diabetes medication regimen:   - metformin XR 500 mg twice daily  - Trulicity 0.75 mg once a week on Tuesdays   Previous medications for diabetes:   - metformin IR - diarrhea   - Januvia - switched to Trulicity     Blood Glucose  Monitoring times: fasting  The baseline HbA1C was 9.4 on 07/07/2020.    Patient reported readings:  Fasting BGs range 101-145 mg/dL.   168 mg/dL at 0:45 AM - this morning, first time it's been this high in 2 months   Other fasting readings - 117, 149, 118    Diet and Exercise  Number of meals per day: 2  Breakfast: doesn't usually eat breakfast  Lunch: chicken salad  Dinner: mac and cheese, chicken salad, veggies, usually fairly carb heavy   Snacks: sometimes will snack throughout the day - popcorn, cookies, rice crispy, potato chips   Drinks: if not going out and wants something with his meal then will drink soda, but when he goes out will have water  Last meal of the day is usually 5 pm     Comorbidities  ASCVD condition(s): none  CKD: no  Heart failure: no    Obesity: yes    Health Maintenance  Aspirin utilization: Patient is not taking aspirin due to the following reason(s): not indicated.  Statin utilization: Patient is taking a statin.  Hypertension: Patient has hypertension, which is not controlled.        Primary Care - Labs  Primary Care - Medication History  Home Medications    Medication Sig   blood sugar diagnostic (ONETOUCH ULTRA TEST) test strip Use one strip as directed daily before breakfast. Diagnosis Code: Uncontrolled DM-2   blood-glucose meter (ACCU-CHEK GUIDE GLUCOSE METER) kit Use one strip as directed daily. Diagnosis Code:  DM-2   carvediloL (COREG) 6.25 mg tablet Take two tablets by mouth twice daily with meals. Take with food.   cephalexin (KEFLEX) 500 mg capsule TAKE 1 CAPSULE BY MOUTH EVERY 8 HOURS FOR 7 DAYS   cholecalciferol(+) (VITAMIN D3) 2,000 unit tablet Take one tablet by mouth daily.   cyanocobalamin (VITAMIN B-12) 1,000 mcg tablet Take one tablet by mouth daily.   fish oil /omega-3 fatty acids (SEA-OMEGA) 340/1000 mg capsule Take one capsule by mouth daily.   furosemide (LASIX) 20 mg tablet Take one tablet by mouth daily as needed.   lancets 33 gauge 33 gauge Use one each as directed daily.   Leg Brace misc Bilateral Fitted leg brace and right shoe for foot drop.  Dx: spina bifida.   levETIRAcetam (KEPPRA) 500 mg tablet Take one tablet by mouth twice daily.   losartan (COZAAR) 100 mg tablet Take one tablet by mouth daily. metFORMIN-XR (GLUCOPHAGE XR) 500 mg extended release tablet Take one tablet by mouth twice daily with meals.   Miscellaneous Medical Supply misc Shoe lift for right foot.  Dx: spina bifida   multivitamin with iron (MULTIPLE VITAMINS WITH IRON PO) Take 1 tablet by mouth daily.   Ostomy Supplies 1  misc [employer] 1 piece Urostomy bag.  Use 1 bag as needed.   rosuvastatin (CRESTOR) 20 mg tablet Take one tablet by mouth daily.   spironolactone (ALDACTONE) 25 mg tablet Take two tablets by mouth daily. Take with food   triamcinolone acetonide (TRIDERM) 0.1 % topical cream Apply  topically to affected area twice daily.   TRULICITY 0.75 mg/0.5 mL injection pen inject 0.20ml UNDER THE SKIN EVERY 7 DAYS      Education  Education provided: not necessary    Time spent with patient: 30 minutes    Reece Leader, [facility]

## 2021-07-29 ENCOUNTER — Ambulatory Visit: Admit: 2021-07-29 | Discharge: 2021-07-29 | Payer: MEDICARE

## 2021-07-29 ENCOUNTER — Encounter: Admit: 2021-07-29 | Discharge: 2021-07-29 | Payer: MEDICARE

## 2021-07-29 DIAGNOSIS — Q059 Spina bifida, unspecified: Secondary | ICD-10-CM

## 2021-07-29 DIAGNOSIS — N200 Calculus of kidney: Secondary | ICD-10-CM

## 2021-07-29 DIAGNOSIS — R000 Tachycardia, unspecified: Secondary | ICD-10-CM

## 2021-07-29 DIAGNOSIS — R569 Unspecified convulsions: Secondary | ICD-10-CM

## 2021-07-29 DIAGNOSIS — N390 Urinary tract infection, site not specified: Secondary | ICD-10-CM

## 2021-07-29 DIAGNOSIS — R51 Headache: Secondary | ICD-10-CM

## 2021-07-29 DIAGNOSIS — Z01810 Encounter for preprocedural cardiovascular examination: Secondary | ICD-10-CM

## 2021-07-29 DIAGNOSIS — I10 Essential (primary) hypertension: Secondary | ICD-10-CM

## 2021-07-29 DIAGNOSIS — Q0700 Arnold-Chiari syndrome without spina bifida or hydrocephalus: Secondary | ICD-10-CM

## 2021-07-29 DIAGNOSIS — T85618A Breakdown (mechanical) of other specified internal prosthetic devices, implants and grafts, initial encounter: Secondary | ICD-10-CM

## 2021-07-29 DIAGNOSIS — R339 Retention of urine, unspecified: Secondary | ICD-10-CM

## 2021-07-29 DIAGNOSIS — G4733 Obstructive sleep apnea (adult) (pediatric): Secondary | ICD-10-CM

## 2021-07-29 DIAGNOSIS — F988 Other specified behavioral and emotional disorders with onset usually occurring in childhood and adolescence: Secondary | ICD-10-CM

## 2021-07-29 DIAGNOSIS — G919 Hydrocephalus, unspecified: Secondary | ICD-10-CM

## 2021-07-29 DIAGNOSIS — N319 Neuromuscular dysfunction of bladder, unspecified: Secondary | ICD-10-CM

## 2021-07-29 DIAGNOSIS — I429 Cardiomyopathy, unspecified: Secondary | ICD-10-CM

## 2021-07-29 LAB — BASIC METABOLIC PANEL
ANION GAP: 11 (ref 3–12)
BLD UREA NITROGEN: 21 mg/dL (ref 7–25)
CALCIUM: 9.8 mg/dL (ref 8.5–10.6)
CHLORIDE: 101 MMOL/L (ref 98–110)
CO2: 27 MMOL/L (ref 21–30)
CREATININE: 0.8 mg/dL (ref 0.4–1.24)
EGFR: >60 mL/min (ref >60)
GLUCOSE,PANEL: 130 mg/dL — ABNORMAL HIGH (ref 70–100)
POTASSIUM: 4.4 MMOL/L (ref 3.5–5.1)
SODIUM: 139 MMOL/L (ref 137–147)

## 2021-08-06 ENCOUNTER — Ambulatory Visit: Admit: 2021-08-06 | Discharge: 2021-08-07 | Payer: MEDICARE

## 2021-08-06 DIAGNOSIS — G40209 Localization-related (focal) (partial) symptomatic epilepsy and epileptic syndromes with complex partial seizures, not intractable, without status epilepticus: Secondary | ICD-10-CM

## 2021-08-06 MED ORDER — LEVETIRACETAM 500 MG PO TAB
500 mg | ORAL_TABLET | Freq: Two times a day (BID) | ORAL | 3 refills | 90.00000 days | Status: AC
Start: 2021-08-06 — End: ?

## 2021-08-06 NOTE — Patient Instructions
You should not drive unless 6 months free of alteration of consciousness, not work in close proximity of machines with moving parts, swim unsupervised or to work at high places.You should  shower (without accumulation of water) instead of taking a bath if unsupervised.    Contact us if there is an exacerbation of the seizures or any side effects from the antiepileptic medications.

## 2021-08-13 ENCOUNTER — Encounter: Admit: 2021-08-13 | Discharge: 2021-08-13 | Payer: MEDICARE

## 2021-08-13 MED ORDER — CARVEDILOL 6.25 MG PO TAB
12.5 mg | ORAL_TABLET | Freq: Two times a day (BID) | ORAL | 3 refills | 90.00000 days | Status: AC
Start: 2021-08-13 — End: ?

## 2021-08-18 ENCOUNTER — Encounter: Admit: 2021-08-18 | Discharge: 2021-08-18 | Payer: MEDICARE

## 2021-08-18 ENCOUNTER — Ambulatory Visit: Admit: 2021-08-18 | Discharge: 2021-08-18 | Payer: MEDICARE

## 2021-08-18 NOTE — Progress Notes
A follow-up comprehensive medication management visit was completed today via telephone.    Referral reason: Diabetes Management  Referring provider: Orson Gear, MD    Assessment & Plan     Diabetes  Patient's diabetes is uncontrolled as reflected by home blood sugars and A1C. Their goal glucose is pre-prandial 80-130 and post-prandial <180. with a goal of < 7%    Limited blood sugar reports, but appears to be at goal. Discussed with patient will increase dose of Trulicity at this time, as intended from previous visit. Will continue working on cutting down soda intake.  Can consider optimizing metformin dose in the future.     Plan  - INCREASE Trulicity 1.5 mg once a week on Tuesday.  - Continue metformin XR 500 mg twice daily      Follow-up  The patient will continue to follow up with the pharmacist. Return to pharmacist in 4 weeks via telephone. The return visit was scheduled during today's visit.    Subjective & Objective   At the last visit, the patient was instructed to Increase Trulicity to 1.5mg  weekly. The patient states they made none of the recommended changes.   Confirmed he is tolerating his Trulicity and metformin well. Denies any nausea, vomiting, or diarrhea.  He reports finishing up his Trulicity 0.75mg  and did not increase to 1.5mg . Today will be his first dose of 1.5mg       Working on cutting down soda intake. Working on limiting to just one a day.  He drinks 1 Coke zero daily     He was on metformin IR and had diarrhea and switched XR. Patient does not recall ever taking a higher dose than he does currently.       Diabetes    HPI  Current diabetes medication regimen:   - metformin XR 500 mg twice daily  - Trulicity 0.75 mg once a week on Tuesdays   Previous medications for diabetes:   - metformin IR - diarrhea   - Januvia - switched to Trulicity     Blood Glucose  Monitoring times: fasting  The baseline HbA1C was 9.4 on 07/07/2020.    Patient reported readings:  Fasting BGs: 120s  During appointment: 128 (fasting)    Diet and Exercise  Number of meals per day: 2  Breakfast: doesn't usually eat breakfast  Lunch: chicken salad  Dinner: mac and cheese, chicken salad, veggies, usually fairly carb heavy   Snacks: sometimes will snack throughout the day - popcorn, cookies, rice crispy, potato chips   Drinks: if not going out and wants something with his meal then will drink soda, but when he goes out will have water  Last meal of the day is usually 5 pm     Comorbidities  ASCVD condition(s): none  CKD: no  Heart failure: no    Obesity: yes    Health Maintenance  Aspirin utilization: Patient is not taking aspirin due to the following reason(s): not indicated.  Statin utilization: Patient is taking a statin.  Hypertension: Patient has hypertension, which is not controlled.        Primary Care - Labs   Basic Metabolic Profile    Lab Results   Component Value Date/Time    NA 139 07/29/2021 02:45 PM    K 4.4 07/29/2021 02:45 PM    CA 9.8 07/29/2021 02:45 PM    CL 101 07/29/2021 02:45 PM    CO2 27 07/29/2021 02:45 PM    GAP 11 07/29/2021 02:45 PM  EGFR1 >60 07/29/2021 02:45 PM    Lab Results   Component Value Date/Time    BUN 21 07/29/2021 02:45 PM    CR 0.85 07/29/2021 02:45 PM    GLU 130 (H) 07/29/2021 02:45 PM    GLU 113 (H) 10/29/2020 02:53 PM         Lab Draw:  Lab Results   Component Value Date/Time    HGBA1C 8.6 (H) 04/29/2021 12:32 PM    HGBA1C 9.5 (H) 10/29/2020 02:53 PM    HGBA1C 9.4 (H) 07/07/2020 02:20 PM     POC:  No results found for: A1C     No results found for: MCALB24   Microalbumin/CR ratio Urine   Date Value Ref Range Status   10/29/2020 14 <30 mcg/mg creat Final     Comment:        The ADA defines abnormalities in albumin  excretion as follows:     Albuminuria Category        Result (mcg/mg creatinine)     Normal to Mildly increased   <30  Moderately increased         30-299   Severely increased           > OR = 300     The ADA recommends that at least two of three  specimens collected within a 3-6 month period be  abnormal before considering a patient to be  within a diagnostic category.  Test Performed at:  [facility]  [address]  [place]  [id number]  Wenda Low, [place]   Date Value Ref Range Status   10/29/2020 1.4 mg/dL Final     Comment:     Reference Range  Not established          Lab Results   Component Value Date    CHOL 234 (H) 11/04/2020    TRIG 225 (H) 11/04/2020    HDL 39 (L) 11/04/2020    LDL 197 (H) 11/04/2020    VLDL 45 11/04/2020    NONHDLCHOL 195 11/04/2020    CHOLHDLC 6.9 (H) 10/29/2020         BP Readings from Last 1 Encounters:   07/29/21 108/66      Medication History  Medication history was not completed because previously completed (follow-up visit).    Adverse Drug Reactions  Adverse drug reactions were reviewed with the patient.    Significant adverse drug reaction(s) were not identified.    Side effect(s) were not reported.    Home Medications    Medication Sig   blood sugar diagnostic (ONETOUCH ULTRA TEST) test strip Use one strip as directed daily before breakfast. Diagnosis Code: Uncontrolled DM-2   blood-glucose meter (ACCU-CHEK GUIDE GLUCOSE METER) kit Use one strip as directed daily. Diagnosis Code: DM-2   carvediloL (COREG) 6.25 mg tablet Take two tablets by mouth twice daily with meals. Take with food.   cholecalciferol(+) (VITAMIN D3) 2,000 unit tablet Take one tablet by mouth daily.   cyanocobalamin (vitamin B-12) 3,000 mcg cap Take 1 capsule by mouth daily.   dulaglutide (TRULICITY) 1.5 mg/0.5 mL injection pen Inject 0.5 mL under the skin every 7 days.   fish oil /omega-3 fatty acids (SEA-OMEGA) 340/1000 mg capsule Take one capsule by mouth daily.   furosemide (LASIX) 20 mg tablet Take one tablet by mouth daily as needed.   lancets 33 gauge 33 gauge Use one each as directed daily.   Leg Brace misc  Bilateral Fitted leg brace and right shoe for foot drop.  Dx: spina bifida.   levETIRAcetam (KEPPRA) 500 mg tablet Take one tablet by mouth twice daily.   losartan (COZAAR) 100 mg tablet Take one tablet by mouth daily.   metFORMIN-XR (GLUCOPHAGE XR) 500 mg extended release tablet Take one tablet by mouth twice daily with meals.   Miscellaneous Medical Supply misc Shoe lift for right foot.  Dx: spina bifida   multivit-min/folic/vit K/lycop (ONE-A-DAY MEN'S MULTIVITAMIN PO) Take 1 tablet by mouth daily.   Ostomy Supplies 1  misc [employer] 1 piece Urostomy bag.  Use 1 bag as needed.   psyllium seed (with sugar) (FIBER PO) Take 2 tablets by mouth daily.   rosuvastatin (CRESTOR) 20 mg tablet Take one tablet by mouth daily.   spironolactone (ALDACTONE) 25 mg tablet Take two tablets by mouth daily. Take with food   triamcinolone acetonide (TRIDERM) 0.1 % topical cream Apply  topically to affected area twice daily.  Patient taking differently: Apply  topically to affected area as Needed.      Education  Education provided: not necessary    Time spent with patient: 6 minutes

## 2021-08-18 NOTE — Progress Notes
ATTESTATION    A pharmacy student participated in the care of this patient. I have reviewed documentation by the student and concur with student documentation unless otherwise noted. Please see my note for documentation of complete visit.    Staff name:  Reece Leader, [facility]   Date: 08/18/2021 12:21 PM

## 2021-08-19 ENCOUNTER — Encounter: Admit: 2021-08-19 | Discharge: 2021-08-19 | Payer: MEDICARE

## 2021-08-19 MED FILL — LANCETS 33 GAUGE MISC MISC: 33 gauge | 90 days supply | Qty: 100 | Fill #3 | Status: CP

## 2021-08-19 MED FILL — ONETOUCH ULTRA TEST MISC STRP: 90 days supply | Qty: 100 | Fill #5 | Status: CP

## 2021-08-19 NOTE — Telephone Encounter
Called to let patient know that Dr. Leonette Monarch office would be contacting him for a follow-up appointment. Verbalized understanding.

## 2021-08-28 ENCOUNTER — Encounter: Admit: 2021-08-28 | Discharge: 2021-08-28 | Payer: MEDICARE

## 2021-08-28 NOTE — Progress Notes
General Risk Score 5  HCC Score 2.711  Program criteria met -   Problem List (Diagnosis)    Patient was identified using dual identification of name and date of birth with repeat back    Patient Reports:    1. Staying compliant with medications. Blood sugar 109 average.   2. Has cut soda down to 1 per day.   3. Has been getting more activity by helping take care of niece and nephew and new family dog  4. Feeling okay at this time    Treatment Goals and Expected Outcomes (patient centered)    1. Good blood sugar control   2. Increase activity as tolerated for strength, balance and mobility  3. Stay as healthy as possible     Barriers to Care:    1. Knowledge deficit    Care Plan:    1. Continue to follow plan of care and specialty provider appointments  2. Continue work on [employer], good job on cutting down soda. Lean protein, vegetables and fruit - low carb  3. Continue to increase activity as tolerated for strength, balance and mobility  4. Instructed patient to call care manager any time with questions or concerns.   5. Routing note to PCP    LOV:    05/20/2021    Upcoming appointments include:    Future Appointments   Date Time Provider Department Center   09/15/2021 12:00 PM Reece Leader, [id number]   09/29/2021 11:00 AM Wyre, Ranelle Oyster, MD [id number] [place] Exam   10/28/2021  1:30 PM Hiram Gash, MD [facility]   11/05/2021  2:45 PM  ECHO 3 [id number] Procedur   11/05/2021  4:00 PM Vidic, Vernell Morgans, DO [facility] Exam     Next review /revise of plan (approximate):    4-6 weeks    Note to patient: The 21st Century Cures Act makes medical notes like these available to patients in the interest of transparency. However, be advised this is a medical document. It is intended as peer to peer communication. It is written in medical language and may contain abbreviations or words that are unfamiliar. It may appear direct. Medical documents are intended to carry relevant information, facts as evident, and the clinical opinion of the clinician.

## 2021-09-09 ENCOUNTER — Encounter: Admit: 2021-09-09 | Discharge: 2021-09-09 | Payer: MEDICARE

## 2021-09-10 ENCOUNTER — Encounter: Admit: 2021-09-10 | Discharge: 2021-09-10 | Payer: MEDICARE

## 2021-09-11 ENCOUNTER — Encounter: Admit: 2021-09-11 | Discharge: 2021-09-11 | Payer: MEDICARE

## 2021-09-11 NOTE — Progress Notes
Caregiver from [employer] stopped by clinic with forms that were completed by Provider.  He is asking for a page that was not included when the papers were returned to them.  Copy make for provider and placed on Providers desk for review.Marland Kitchen

## 2021-09-15 ENCOUNTER — Encounter: Admit: 2021-09-15 | Discharge: 2021-09-15 | Payer: MEDICARE

## 2021-09-18 ENCOUNTER — Ambulatory Visit: Admit: 2021-09-18 | Discharge: 2021-09-18 | Payer: MEDICARE

## 2021-09-18 ENCOUNTER — Encounter: Admit: 2021-09-18 | Discharge: 2021-09-18 | Payer: MEDICARE

## 2021-09-18 DIAGNOSIS — E119 Type 2 diabetes mellitus without complications: Secondary | ICD-10-CM

## 2021-09-18 MED ORDER — TRULICITY 3 MG/0.5 ML SC PNIJ
3 mg | SUBCUTANEOUS | 1 refills | Status: AC
Start: 2021-09-18 — End: ?

## 2021-09-18 MED ORDER — METFORMIN 500 MG PO TB24
500 mg | ORAL_TABLET | Freq: Two times a day (BID) | ORAL | 3 refills | Status: AC
Start: 2021-09-18 — End: ?

## 2021-09-18 NOTE — Progress Notes
A follow-up comprehensive medication management visit was completed today via telephone.    Referral reason: Diabetes Management  Referring provider: Orson Gear, MD    Assessment & Plan     Diabetes  Patient's diabetes is controlled as reflected by home blood sugars. Their goal glucose is pre-prandial 80-130 and post-prandial <180. with a goal of < 7%    Reported blood sugars appear to be at goal. Tolerating dose increase well. Discussed with patient can increase dose further for additional BG lowering. He is due for A1C recheck next month at PCP's visit. Will plan on seeing him in clinic on the same day.    Plan  - INCREASE Trulicity 3 mg once a week on Tuesday.   - Continue metformin XR 500 mg twice daily    Hypertension  Patient's blood pressure is controlled per clinic readings. They have not had a microalbumin completed. Their goal blood pressure is < 130/80 mmHg    In clinic blood pressure readings are at goal. No change to regimen warranted.    Dyslipidemia  The patient is on statin therapy. They are on high-intensity statin therapy.  Other lipid lowering agents: no    Therapy is indicated for primary prevention of ASCVD in adults with diabetes.    The current regimen is appropriate. The last LDL was 197 which is not meeting the patient's goal of > 50% reduction from baseline based on 2018 ACC/AHA guidelines. Patient due for LDL recheck after starting on statin therapy. Will recheck labs at PCP visit next month.     Plan  - Continue rosuvastatin 20 mg daily      Follow-up  The patient will continue to follow up with the pharmacist. Return to pharmacist in 6 weeks via telephone. The return visit was scheduled during today's visit.    Subjective & Objective   At the last visit, the patient was instructed to Increase Trulicity to 1.5mg  weekly. The patient states they made all of the recommended changes.     Confirmed he is tolerating his Trulicity and metformin well. Denies any nausea, vomiting, or diarrhea. Patient has taken 4 doses of Trulicity 1.5 mg weekly. Patient is tolerating well. Did pick up another box of Trulicity and took a dose from his new box this past Tuesday.      Working on cutting down soda intake. Working on limiting to just one a day.  He drinks 1 Coke zero daily     He was on metformin IR and had diarrhea and switched XR. Patient does not recall ever taking a higher dose than he does currently.     Diabetes    HPI  Current diabetes medication regimen:   - metformin XR 500 mg twice daily  - Trulicity 1.5 mg once a week on Tuesdays   Previous medications for diabetes:   - metformin IR - diarrhea   - Januvia - switched to Trulicity     Blood Glucose  Monitoring times: fasting  The baseline HbA1C was 9.4 on 07/07/2020.    Patient reported readings:  Fasting - 98, 110s. Highest was 150.       Diet and Exercise  Number of meals per day: 2  Breakfast: doesn't usually eat breakfast  Lunch: chicken salad  Dinner: mac and cheese, chicken salad, veggies, usually fairly carb heavy   Snacks: sometimes will snack throughout the day - popcorn, cookies, rice crispy, potato chips   Drinks: if not going out and wants something with his meal  then will drink soda, but when he goes out will have water  Last meal of the day is usually 5 pm     Hypertension    HPI  Current hypertension medication regimen:   - carvedilol 6.25 mg twice daily  - furosemide 20 mg daily as needed  - losartan 100 mg daily  - spironolactone 50 mg daily    Dyslipidemia    HPI  Current dyslipidemia medication regimen:   - rosuvastatin 20 mg daily  Criteria for statin use: diabetes and LDL > 70mg /dL (age 09-81)    Comorbidities  ASCVD condition(s): none  CKD: no  Diabetes: yes (type 2 and uncontrolled)  Dyslipidemia: yes  Heart failure: no    Obesity: yes    Health Maintenance  Aspirin utilization: Patient is not taking aspirin due to the following reason(s): not indicated.  Statin utilization: Patient is taking a statin.  Hypertension: Patient has hypertension, which is not controlled.    Primary Care - Labs   Basic Metabolic Profile    Lab Results   Component Value Date/Time    NA 139 07/29/2021 02:45 PM    K 4.4 07/29/2021 02:45 PM    CA 9.8 07/29/2021 02:45 PM    CL 101 07/29/2021 02:45 PM    CO2 27 07/29/2021 02:45 PM    GAP 11 07/29/2021 02:45 PM    EGFR1 >60 07/29/2021 02:45 PM    Lab Results   Component Value Date/Time    BUN 21 07/29/2021 02:45 PM    CR 0.85 07/29/2021 02:45 PM    GLU 130 (H) 07/29/2021 02:45 PM    GLU 113 (H) 10/29/2020 02:53 PM         Lab Draw:  Lab Results   Component Value Date/Time    HGBA1C 8.6 (H) 04/29/2021 12:32 PM    HGBA1C 9.5 (H) 10/29/2020 02:53 PM    HGBA1C 9.4 (H) 07/07/2020 02:20 PM     POC:  No results found for: A1C     No results found for: MCALB24   Microalbumin/CR ratio Urine   Date Value Ref Range Status   10/29/2020 14 <30 mcg/mg creat Final     Comment:        The ADA defines abnormalities in albumin  excretion as follows:     Albuminuria Category        Result (mcg/mg creatinine)     Normal to Mildly increased   <30  Moderately increased         30-299   Severely increased           > OR = 300     The ADA recommends that at least two of three  specimens collected within a 3-6 month period be  abnormal before considering a patient to be  within a diagnostic category.  Test Performed at:  [facility]  [address]  [place]  [id number]  Wenda Low, [place]   Date Value Ref Range Status   10/29/2020 1.4 mg/dL Final     Comment:     Reference Range  Not established          Lab Results   Component Value Date    CHOL 234 (H) 11/04/2020    TRIG 225 (H) 11/04/2020    HDL 39 (L) 11/04/2020    LDL 197 (H) 11/04/2020    VLDL 45 11/04/2020    NONHDLCHOL 195 11/04/2020    CHOLHDLC 6.9 (  H) 10/29/2020         BP Readings from Last 1 Encounters:   07/29/21 108/66      Medication History  Medication history was not completed because previously completed (follow-up visit).    Adverse Drug Reactions  Adverse drug reactions were reviewed with the patient.    Significant adverse drug reaction(s) were not identified.    Side effect(s) were not reported.    Home Medications    Medication Sig   blood sugar diagnostic (ONETOUCH ULTRA TEST) test strip Use one strip as directed daily before breakfast. Diagnosis Code: Uncontrolled DM-2   blood-glucose meter (ACCU-CHEK GUIDE GLUCOSE METER) kit Use one strip as directed daily. Diagnosis Code: DM-2   carvediloL (COREG) 6.25 mg tablet Take two tablets by mouth twice daily with meals. Take with food.   cholecalciferol(+) (VITAMIN D3) 2,000 unit tablet Take one tablet by mouth daily.   cyanocobalamin (vitamin B-12) 3,000 mcg cap Take 1 capsule by mouth daily.   dulaglutide (TRULICITY) 1.5 mg/0.5 mL injection pen Inject 0.5 mL under the skin every 7 days.   fish oil /omega-3 fatty acids (SEA-OMEGA) 340/1000 mg capsule Take one capsule by mouth daily.   furosemide (LASIX) 20 mg tablet Take one tablet by mouth daily as needed.   lancets 33 gauge 33 gauge Use one each as directed daily.   Leg Brace misc Bilateral Fitted leg brace and right shoe for foot drop.  Dx: spina bifida.   levETIRAcetam (KEPPRA) 500 mg tablet Take one tablet by mouth twice daily.   losartan (COZAAR) 100 mg tablet Take one tablet by mouth daily.   metFORMIN-XR (GLUCOPHAGE XR) 500 mg extended release tablet Take one tablet by mouth twice daily with meals.   Miscellaneous Medical Supply misc Shoe lift for right foot.  Dx: spina bifida   multivit-min/folic/vit K/lycop (ONE-A-DAY MEN'S MULTIVITAMIN PO) Take 1 tablet by mouth daily.   Ostomy Supplies 1  misc [employer] 1 piece Urostomy bag.  Use 1 bag as needed.   psyllium seed (with sugar) (FIBER PO) Take 2 tablets by mouth daily.   rosuvastatin (CRESTOR) 20 mg tablet Take one tablet by mouth daily.   spironolactone (ALDACTONE) 25 mg tablet Take two tablets by mouth daily. Take with food   triamcinolone acetonide (TRIDERM) 0.1 % topical cream Apply  topically to affected area twice daily.  Patient taking differently: Apply  topically to affected area as Needed.      Education  Education provided: not necessary    Time spent with patient: 15 minutes    Reece Leader, [facility]

## 2021-09-29 ENCOUNTER — Encounter: Admit: 2021-09-29 | Discharge: 2021-09-29 | Payer: MEDICARE

## 2021-09-29 DIAGNOSIS — Q059 Spina bifida, unspecified: Secondary | ICD-10-CM

## 2021-09-29 DIAGNOSIS — N1330 Unspecified hydronephrosis: Secondary | ICD-10-CM

## 2021-09-29 DIAGNOSIS — F988 Other specified behavioral and emotional disorders with onset usually occurring in childhood and adolescence: Secondary | ICD-10-CM

## 2021-09-29 DIAGNOSIS — I10 Essential (primary) hypertension: Secondary | ICD-10-CM

## 2021-09-29 DIAGNOSIS — Z936 Other artificial openings of urinary tract status: Secondary | ICD-10-CM

## 2021-09-29 DIAGNOSIS — I429 Cardiomyopathy, unspecified: Secondary | ICD-10-CM

## 2021-09-29 DIAGNOSIS — R339 Retention of urine, unspecified: Secondary | ICD-10-CM

## 2021-09-29 DIAGNOSIS — G4733 Obstructive sleep apnea (adult) (pediatric): Secondary | ICD-10-CM

## 2021-09-29 DIAGNOSIS — R51 Headache: Secondary | ICD-10-CM

## 2021-09-29 DIAGNOSIS — G919 Hydrocephalus, unspecified: Secondary | ICD-10-CM

## 2021-09-29 DIAGNOSIS — N319 Neuromuscular dysfunction of bladder, unspecified: Secondary | ICD-10-CM

## 2021-09-29 DIAGNOSIS — T85618A Breakdown (mechanical) of other specified internal prosthetic devices, implants and grafts, initial encounter: Secondary | ICD-10-CM

## 2021-09-29 DIAGNOSIS — R000 Tachycardia, unspecified: Secondary | ICD-10-CM

## 2021-09-29 DIAGNOSIS — R569 Unspecified convulsions: Secondary | ICD-10-CM

## 2021-09-29 DIAGNOSIS — Q0700 Arnold-Chiari syndrome without spina bifida or hydrocephalus: Secondary | ICD-10-CM

## 2021-09-29 DIAGNOSIS — N200 Calculus of kidney: Secondary | ICD-10-CM

## 2021-09-29 DIAGNOSIS — Z01810 Encounter for preprocedural cardiovascular examination: Secondary | ICD-10-CM

## 2021-09-29 DIAGNOSIS — N390 Urinary tract infection, site not specified: Secondary | ICD-10-CM

## 2021-10-02 ENCOUNTER — Encounter: Admit: 2021-10-02 | Discharge: 2021-10-02 | Payer: MEDICARE

## 2021-10-07 ENCOUNTER — Encounter: Admit: 2021-10-07 | Discharge: 2021-10-07 | Payer: MEDICARE

## 2021-10-07 DIAGNOSIS — I428 Other cardiomyopathies: Secondary | ICD-10-CM

## 2021-10-07 DIAGNOSIS — I10 Essential (primary) hypertension: Secondary | ICD-10-CM

## 2021-10-07 MED ORDER — LOSARTAN 100 MG PO TAB
100 mg | ORAL_TABLET | Freq: Every day | ORAL | 3 refills | 90.00000 days | Status: AC
Start: 2021-10-07 — End: ?

## 2021-10-20 ENCOUNTER — Encounter: Admit: 2021-10-20 | Discharge: 2021-10-20 | Payer: MEDICARE

## 2021-10-28 ENCOUNTER — Encounter: Admit: 2021-10-28 | Discharge: 2021-10-28 | Payer: MEDICARE

## 2021-10-28 ENCOUNTER — Ambulatory Visit: Admit: 2021-10-28 | Discharge: 2021-10-28 | Payer: MEDICARE

## 2021-10-28 DIAGNOSIS — E1165 Type 2 diabetes mellitus with hyperglycemia: Secondary | ICD-10-CM

## 2021-10-28 DIAGNOSIS — Q0700 Arnold-Chiari syndrome without spina bifida or hydrocephalus: Secondary | ICD-10-CM

## 2021-10-28 DIAGNOSIS — I429 Cardiomyopathy, unspecified: Secondary | ICD-10-CM

## 2021-10-28 DIAGNOSIS — G4733 Obstructive sleep apnea (adult) (pediatric): Secondary | ICD-10-CM

## 2021-10-28 DIAGNOSIS — G919 Hydrocephalus, unspecified: Secondary | ICD-10-CM

## 2021-10-28 DIAGNOSIS — Z01810 Encounter for preprocedural cardiovascular examination: Secondary | ICD-10-CM

## 2021-10-28 DIAGNOSIS — Z0000 Encounter for general adult medical examination without abnormal findings: Secondary | ICD-10-CM

## 2021-10-28 DIAGNOSIS — R000 Tachycardia, unspecified: Secondary | ICD-10-CM

## 2021-10-28 DIAGNOSIS — N200 Calculus of kidney: Secondary | ICD-10-CM

## 2021-10-28 DIAGNOSIS — T85618A Breakdown (mechanical) of other specified internal prosthetic devices, implants and grafts, initial encounter: Secondary | ICD-10-CM

## 2021-10-28 DIAGNOSIS — R51 Headache: Secondary | ICD-10-CM

## 2021-10-28 DIAGNOSIS — Q059 Spina bifida, unspecified: Secondary | ICD-10-CM

## 2021-10-28 DIAGNOSIS — R569 Unspecified convulsions: Secondary | ICD-10-CM

## 2021-10-28 DIAGNOSIS — R339 Retention of urine, unspecified: Secondary | ICD-10-CM

## 2021-10-28 DIAGNOSIS — N319 Neuromuscular dysfunction of bladder, unspecified: Secondary | ICD-10-CM

## 2021-10-28 DIAGNOSIS — I10 Essential (primary) hypertension: Secondary | ICD-10-CM

## 2021-10-28 DIAGNOSIS — N390 Urinary tract infection, site not specified: Secondary | ICD-10-CM

## 2021-10-28 DIAGNOSIS — F988 Other specified behavioral and emotional disorders with onset usually occurring in childhood and adolescence: Secondary | ICD-10-CM

## 2021-10-28 LAB — COMPREHENSIVE METABOLIC PANEL
ALBUMIN: 4.5 g/dL (ref 3.5–5.0)
ALT: 34 U/L (ref 7–56)
ANION GAP: 11 FL (ref 3–12)
BLD UREA NITROGEN: 18 mg/dL (ref 7–25)
CHLORIDE: 102 MMOL/L — ABNORMAL HIGH (ref 98–110)
CREATININE: 0.9 mg/dL (ref 0.4–1.24)
EGFR: >60 mL/min (ref >60)
GLUCOSE,PANEL: 96 mg/dL (ref 70–100)
POTASSIUM: 4.5 MMOL/L — ABNORMAL HIGH (ref <100)

## 2021-10-28 LAB — MICROALB/CR RATIO-URINE RANDOM
MICROALBUMIN, RAN: 50 ug/mL
MICROALBUMIN/CR RATIO URINE: 54 ug/mg — ABNORMAL HIGH (ref <30)
UR CREATININE, RAN: 93 mg/dL

## 2021-10-28 LAB — CBC
HEMATOCRIT: 42 % (ref 40–50)
MCH: 29 pg (ref 26–34)
MCHC: 33 g/dL (ref 32.0–36.0)
RDW: 13 % (ref 11–15)

## 2021-10-28 LAB — LIPID PROFILE
CHOLESTEROL: 161 mg/dL (ref <200)
HDL: 39 mg/dL — ABNORMAL LOW (ref >40)
TRIGLYCERIDES: 189 mg/dL — ABNORMAL HIGH (ref <150)

## 2021-10-28 NOTE — Patient Instructions
Health Maintenance   Topic Date Due    PNEUMOCOCCAL VACCINE 0-64 YRS (1 - PCV) Never done    HIV SCREENING  Never done    DIABETES HBA1C  10/29/2021    DIABETES MICROALBUMIN TO CREATININE RATIO  10/29/2021    PHYSICAL (COMPREHENSIVE) EXAM  10/29/2021    DIABETES FOOT EXAM (.dmfoot)  05/21/2022    DIABETES EGFR SCREEN  07/30/2022    DIABETES DILATED EYE EXAM  08/29/2022    MEDICARE ANNUAL WELLNESS VISIT  10/29/2022    DTAP/TDAP VACCINES (10 - Td or Tdap) 03/01/2031    COVID-19 VACCINE  Completed    HEPATITIS C SCREENING  Completed    DEPRESSION SCREENING  Completed    INFLUENZA VACCINE  Completed

## 2021-10-28 NOTE — Progress Notes
A follow-up comprehensive medication management visit was completed today via telephone.    Referral reason: Diabetes Management  Referring provider: Orson Gear, MD    Assessment & Plan     Diabetes  Patient's diabetes is controlled as reflected by home blood sugars. Their goal glucose is pre-prandial 80-130 and post-prandial <180. with a goal of < 7%    Reported blood sugars are at goal. Will wait until his A1C comes back before deciding whether or not to increase dose further.     Plan  - Continue Trulicity 3 mg once a week   - Continue metformin XR 500 mg twice daily    Hypertension  Patient's blood pressure is controlled per clinic readings. They have not had a microalbumin completed. Their goal blood pressure is < 130/80 mmHg    In clinic blood pressure readings are at goal. No change to regimen warranted.    Dyslipidemia  The patient is on statin therapy. They are on high-intensity statin therapy.  Other lipid lowering agents: no    Therapy is indicated for primary prevention of ASCVD in adults with diabetes.    The current regimen is appropriate. The last LDL was 197 which is not meeting the patient's goal of > 50% reduction from baseline based on 2018 ACC/AHA guidelines. Patient due for LDL recheck after starting on statin therapy. Will recheck labs at PCP visit next month.     Plan  - Continue rosuvastatin 20 mg daily      Follow-up  The patient will continue to follow up with the pharmacist. Return to pharmacist in 4 weeks via telephone. The return visit was scheduled during today's visit.    Subjective & Objective   At the last visit, the patient was instructed to Increase Trulicity to 3 mg weekly. The patient states they made all of the recommended changes.     Saw PCP prior to today's appt. Will be going to the lab afterwards.   Reported his fasting blood sugars have been 100s-110s. Reported highest reading he has seen is 163 this morning    Confirmed he is tolerating his Trulicity and metformin well. Denies any nausea, vomiting, or diarrhea.       Working on cutting down soda intake. Working on limiting to just one a day.  He drinks 1 Coke zero daily   Has decreased appetite. Sometimes forgets to eat.    Diabetes    HPI  Current diabetes medication regimen:   - metformin XR 500 mg twice daily  - Trulicity 3 mg once a week on Tuesdays   Previous medications for diabetes:   - metformin IR - diarrhea   - Januvia - switched to Trulicity     Blood Glucose  Monitoring times: fasting  The baseline HbA1C was 9.4 on 07/07/2020.    Diet and Exercise  Number of meals per day: 2  Breakfast: doesn't usually eat breakfast  Lunch: chicken salad  Dinner: mac and cheese, chicken salad, veggies, usually fairly carb heavy   Snacks: sometimes will snack throughout the day - popcorn, cookies, rice crispy, potato chips   Drinks: if not going out and wants something with his meal then will drink soda, but when he goes out will have water  Last meal of the day is usually 5 pm     Hypertension    HPI  Current hypertension medication regimen:   - carvedilol 6.25 mg twice daily  - furosemide 20 mg daily as needed  - losartan  100 mg daily  - spironolactone 50 mg daily    Dyslipidemia    HPI  Current dyslipidemia medication regimen:   - rosuvastatin 20 mg daily  Criteria for statin use: diabetes and LDL > 70mg /dL (age 16-10)    Comorbidities  ASCVD condition(s): none  CKD: no  Diabetes: yes (type 2 and uncontrolled)  Dyslipidemia: yes  Heart failure: no    Obesity: yes    Health Maintenance  Aspirin utilization: Patient is not taking aspirin due to the following reason(s): not indicated.  Statin utilization: Patient is taking a statin.  Hypertension: Patient has hypertension, which is not controlled.    Primary Care - Labs   Basic Metabolic Profile    Lab Results   Component Value Date/Time    NA 139 07/29/2021 02:45 PM    K 4.4 07/29/2021 02:45 PM    CA 9.8 07/29/2021 02:45 PM    CL 101 07/29/2021 02:45 PM    CO2 27 07/29/2021 02:45 PM    GAP 11 07/29/2021 02:45 PM    EGFR1 >60 07/29/2021 02:45 PM    Lab Results   Component Value Date/Time    BUN 21 07/29/2021 02:45 PM    CR 0.85 07/29/2021 02:45 PM    GLU 130 (H) 07/29/2021 02:45 PM    GLU 113 (H) 10/29/2020 02:53 PM         Lab Draw:  Lab Results   Component Value Date/Time    HGBA1C 8.6 (H) 04/29/2021 12:32 PM    HGBA1C 9.5 (H) 10/29/2020 02:53 PM    HGBA1C 9.4 (H) 07/07/2020 02:20 PM     POC:  No results found for: A1C     No results found for: MCALB24   Microalbumin/CR ratio Urine   Date Value Ref Range Status   10/29/2020 14 <30 mcg/mg creat Final     Comment:        The ADA defines abnormalities in albumin  excretion as follows:     Albuminuria Category        Result (mcg/mg creatinine)     Normal to Mildly increased   <30  Moderately increased         30-299   Severely increased           > OR = 300     The ADA recommends that at least two of three  specimens collected within a 3-6 month period be  abnormal before considering a patient to be  within a diagnostic category.  Test Performed at:  [facility]  [address]  [place]  [address]  Wenda Low, [place]   Date Value Ref Range Status   10/29/2020 1.4 mg/dL Final     Comment:     Reference Range  Not established          Lab Results   Component Value Date    CHOL 234 (H) 11/04/2020    TRIG 225 (H) 11/04/2020    HDL 39 (L) 11/04/2020    LDL 197 (H) 11/04/2020    VLDL 45 11/04/2020    NONHDLCHOL 195 11/04/2020    CHOLHDLC 6.9 (H) 10/29/2020         BP Readings from Last 1 Encounters:   10/28/21 118/76      Medication History  Medication history was not completed because previously completed (follow-up visit).    Adverse Drug Reactions  Adverse drug reactions were reviewed with the patient.  Significant adverse drug reaction(s) were not identified.    Side effect(s) were not reported.    Home Medications    Medication Sig   blood sugar diagnostic (ONETOUCH ULTRA TEST) test strip Use one strip as directed daily before breakfast. Diagnosis Code: Uncontrolled DM-2   blood-glucose meter (ACCU-CHEK GUIDE GLUCOSE METER) kit Use one strip as directed daily. Diagnosis Code: DM-2   carvediloL (COREG) 6.25 mg tablet Take two tablets by mouth twice daily with meals. Take with food.   cholecalciferol(+) (VITAMIN D3) 2,000 unit tablet Take one tablet by mouth daily.   cyanocobalamin (vitamin B-12) 3,000 mcg cap Take 1 capsule by mouth daily.   dulaglutide (TRULICITY) 3 mg/0.5 mL injection pen Inject 0.5 mL under the skin every 7 days.   fish oil /omega-3 fatty acids (SEA-OMEGA) 340/1000 mg capsule Take one capsule by mouth daily.   furosemide (LASIX) 20 mg tablet Take one tablet by mouth daily as needed.   lancets 33 gauge 33 gauge Use one each as directed daily.   Leg Brace misc Bilateral Fitted leg brace and right shoe for foot drop.  Dx: spina bifida.   levETIRAcetam (KEPPRA) 500 mg tablet Take one tablet by mouth twice daily.   losartan (COZAAR) 100 mg tablet TAKE 1 TABLET BY MOUTH ONCE DAILY   metFORMIN-XR (GLUCOPHAGE XR) 500 mg extended release tablet Take one tablet by mouth twice daily with meals.   Miscellaneous Medical Supply misc Shoe lift for right foot.  Dx: spina bifida   multivit-min/folic/vit K/lycop (ONE-A-DAY MEN'S MULTIVITAMIN PO) Take 1 tablet by mouth daily.   Ostomy Supplies 1  misc [employer] 1 piece Urostomy bag.  Use 1 bag as needed.   psyllium seed (with sugar) (FIBER PO) Take 2 tablets by mouth daily.   rosuvastatin (CRESTOR) 20 mg tablet Take one tablet by mouth daily.   spironolactone (ALDACTONE) 25 mg tablet Take two tablets by mouth daily. Take with food   triamcinolone acetonide (TRIDERM) 0.1 % topical cream Apply  topically to affected area twice daily.  Patient taking differently: Apply  topically to affected area as Needed.      Education  Education provided: not necessary    Time spent with patient: 10 minutes    Reece Leader, [facility]

## 2021-11-05 ENCOUNTER — Ambulatory Visit: Admit: 2021-11-05 | Discharge: 2021-11-05 | Payer: MEDICARE

## 2021-11-05 ENCOUNTER — Encounter: Admit: 2021-11-05 | Discharge: 2021-11-05 | Payer: MEDICARE

## 2021-11-05 DIAGNOSIS — G4733 Obstructive sleep apnea (adult) (pediatric): Secondary | ICD-10-CM

## 2021-11-05 DIAGNOSIS — I5042 Chronic combined systolic (congestive) and diastolic (congestive) heart failure: Secondary | ICD-10-CM

## 2021-11-05 DIAGNOSIS — Q0700 Arnold-Chiari syndrome without spina bifida or hydrocephalus: Secondary | ICD-10-CM

## 2021-11-05 DIAGNOSIS — E119 Type 2 diabetes mellitus without complications: Secondary | ICD-10-CM

## 2021-11-05 DIAGNOSIS — R339 Retention of urine, unspecified: Secondary | ICD-10-CM

## 2021-11-05 DIAGNOSIS — Q059 Spina bifida, unspecified: Secondary | ICD-10-CM

## 2021-11-05 DIAGNOSIS — I10 Essential (primary) hypertension: Secondary | ICD-10-CM

## 2021-11-05 DIAGNOSIS — R51 Headache: Secondary | ICD-10-CM

## 2021-11-05 DIAGNOSIS — I428 Other cardiomyopathies: Secondary | ICD-10-CM

## 2021-11-05 DIAGNOSIS — G919 Hydrocephalus, unspecified: Secondary | ICD-10-CM

## 2021-11-05 DIAGNOSIS — R000 Tachycardia, unspecified: Secondary | ICD-10-CM

## 2021-11-05 DIAGNOSIS — N390 Urinary tract infection, site not specified: Secondary | ICD-10-CM

## 2021-11-05 DIAGNOSIS — F988 Other specified behavioral and emotional disorders with onset usually occurring in childhood and adolescence: Secondary | ICD-10-CM

## 2021-11-05 DIAGNOSIS — N319 Neuromuscular dysfunction of bladder, unspecified: Secondary | ICD-10-CM

## 2021-11-05 DIAGNOSIS — T85618A Breakdown (mechanical) of other specified internal prosthetic devices, implants and grafts, initial encounter: Secondary | ICD-10-CM

## 2021-11-05 DIAGNOSIS — R569 Unspecified convulsions: Secondary | ICD-10-CM

## 2021-11-05 DIAGNOSIS — N200 Calculus of kidney: Secondary | ICD-10-CM

## 2021-11-05 DIAGNOSIS — Z01810 Encounter for preprocedural cardiovascular examination: Secondary | ICD-10-CM

## 2021-11-05 DIAGNOSIS — I429 Cardiomyopathy, unspecified: Secondary | ICD-10-CM

## 2021-11-05 MED ORDER — PERFLUTREN LIPID MICROSPHERES 1.1 MG/ML IV SUSP
1-10 mL | Freq: Once | INTRAVENOUS | 0 refills | Status: CP | PRN
Start: 2021-11-05 — End: ?
  Administered 2021-11-05: 20:00:00 2 mL via INTRAVENOUS

## 2021-11-05 MED ORDER — ROSUVASTATIN 20 MG PO TAB
20 mg | ORAL_TABLET | Freq: Every day | ORAL | 3 refills | 90.00000 days | Status: AC
Start: 2021-11-05 — End: ?

## 2021-11-05 NOTE — Progress Notes
Heart Failure/Transplant Clinic Note  Date of Service: 11/05/21    Chief Complaint:  Heart Failure           History of Present Illness:  31 year old gentleman with history of heart failure with improved ejection fraction from 25-65 original diagnosed as systolic dysfunction with sepsis in ICU in 2017.  He has significant past medical history which includes spina bifida with hydrocephalus, seizure disorder, neurogenic bladder, obesity and hypertension.  He used to follow-up with Dr. Ames Dura, Ivory Broad and Dr. Pierre Bali.  His most recent echocardiogram February 27, 2019 showed normal LV size and function.    Patient reports feeling well.  He is able to walk 100 yards before he has stop for shortness of breath.  He denies any orthopnea or PND.  He denies any lower extremity edema.  He was started on Trulicity for his diabetes.  Patient tells me his blood sugars are between 101 15 fasting.  He is comfortable with current pharmacy is managing his GLP-1 agonist and he is not interested in switching to a different medication.  He has not been checking his blood pressure at home at all.    Review of Systems   Constitutional: Positive for fatigue.   HENT: Negative.    Eyes: Negative.    Respiratory: Positive for shortness of breath. Negative for apnea, choking and chest tightness.    Cardiovascular: Negative.  Negative for chest pain, palpitations and leg swelling.   Gastrointestinal: Negative.    Endocrine: Negative.    Genitourinary: Negative.    Musculoskeletal: Negative.    Allergic/Immunologic: Negative.    Neurological: Negative.    Hematological: Negative.    Psychiatric/Behavioral: Negative.        Past Medical History:   Patient Active Problem List    Diagnosis Date Noted   ? Complex care coordination 09/14/2016     Priority: High   ? Hydronephrosis of right kidney 10/04/2021   ? Functional urinary incontinence 09/15/2021   ? Type 2 diabetes mellitus with hyperglycemia (HCC) 10/02/2020   ? Type 2 diabetes mellitus without complication, without long-term current use of insulin (HCC) 07/29/2020   ? Visit for wound check 08/07/2019   ? Neurogenic bowel 06/12/2019   ? Nephrolithiasis 06/07/2019   ? Hypotension 05/02/2019   ? Aspiration into respiratory tract 05/02/2019   ? GERD (gastroesophageal reflux disease) 05/02/2019   ? Right nephrolithiasis 05/02/2019   ? Hypokalemia 05/02/2019   ? Hypomagnesemia 05/02/2019   ? Hyperglycemia 05/02/2019   ? Leukocytosis 05/02/2019   ? Fever 05/02/2019   ? Small bowel obstruction (HCC) 11/28/2018   ? Chronic combined systolic (congestive) and diastolic (congestive) heart failure (HCC) 11/24/2018   ? UTI (urinary tract infection) 03/10/2018   ? Pyelonephritis 04/13/2017   ? Neurogenic bladder 12/16/2016   ? 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 Non-ischemic cardiomyopathy (HCC) 09/29/2016   ? Essential hypertension 07/30/2016   ? Urethral stone 07/31/2015     Added automatically from request for surgery [id number]     ? Morbid obesity (HCC) 05/19/2015   ? Seizure disorder (HCC) 05/19/2015   ? Transaminitis 05/19/2015   ? Hydrocephalus (HCC) 04/28/2015   ? Kidney stone 02/14/2012     [place] 12/27/11: The kidneys are normal in size. The right kidney measures 11.0 x 5.5 cm and contains a large calculus in the mid to inferior pelvis, measuring 4.8 cm x  0.6 cm x 1.5 cm. There is mild pelviectasis which fails to resolve on post void  imaging. The left kidney measures 11.7 x 5.4 cm. No renal masses are identified.  02/28/12 CT showed right staghorn calculus and a small left upper pole stone.  04/07/12 right PCNL - stable post op course for 2 days and discharged. Readmitted with hypotension and respiratory distress and acute drop in H/H. No PE and pleural effusions. No signs of bleeding/no perinephric bleeding. Sepsis w/u neg but placed on erapenum. Second look dealyed until 04/21/12 - successful and dishcaged to [facility]  06/09/12 Recovered fully and now for metabolic w/u               ? 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 Arnold-Chiari malformation (HCC) 11/27/2009   ? ADD (attention deficit disorder) 10/28/2006   ? Spina bifida (HCC) 10/28/2006   ? Unspecified constipation 10/28/2006        Surgical History   Surgical History:   Procedure Laterality Date   ? SHUNT CREATION VENTRICULAR PERITONEAL Right 06/14/2014    Performed by Angelia Mould, MD at [facility] OR   ? SHUNT REVISION VENTRICULAR PERITONEAL Right 04/23/2015    Performed by Angelia Mould, MD at [facility] OR   ? REMOVAL HARDWARE HEAD: removal of left ventriculopleural shunt Left 05/20/2015    Performed by Angelia Mould, MD at [facility] OR   ? SHUNT REMOVAL VENTRICULAR PERITONEAL Right 05/23/2015    Performed by Angelia Mould, MD at [facility] OR   ? SHUNT CREATION VENTRICULAR PERITONEAL Left 06/03/2015    Performed by Angelia Mould, MD at [facility] OR   ? CYSTOURETHROSCOPY, CYSTOLITHOLAPAXY N/A 08/05/2015    Performed by Pennie Banter, MD at [facility] OR   ? HOLMIUM LASER ENUCLEATION OF PROSTATE (NO MORCELLATION) N/A 08/19/2015    Performed by Vonna Drafts, MD at [facility] OR   ? CYSTOSCOPY N/A 08/19/2015    Performed by Vonna Drafts, MD at [facility] OR   ? CYSTOSCOPY EVACUATION CLOTS N/A 08/29/2015    Performed by Vonna Drafts, MD at [facility] OR   ? CYSTOSCOPY, URETHERAL DILATION N/A 02/20/2016    Performed by Vonna Drafts, MD at [facility] OR   ? CYSTECTOMY, ILEAL CONDUIT N/A 10/13/2016    Performed by Glennie Isle, MD at [facility] OR   ? REVISION SHUNT - VENTRICULO-PERITONEAL Left 12/16/2016    Performed by Storm Frisk, MD at [facility] OR   ? CREATION SHUNT - VENTRICULO-PERITONEAL: Left side. 2 hours, proximal catheter and valve in place. will need distal catheter placed, Dr. Joan Flores to complete. Supine Left 12/22/2016    Performed by Angelia Mould, MD at [facility] OR   ? PR REPRGRMG PROGRAMMABLE CEREBROSPINAL SHUNT  01/07/2017   ? PERCUTANEOUS NEPHROSTOLITHOTOMY/ PYELOSTOLITHOTOMY - GREATER THAN 2 CM Left 05/25/2017    Performed by Clarita Crane, MD, [facility] OR   ? CREATION SHUNT - VENTRICULO-PERITONEAL Right 08/04/2017 Performed by Angelia Mould, MD at [facility] OR   ? PERCUTANEOUS NEPHROSTOLITHOTOMY/ PYELOSTOLITHOTOMY - 2 CM OR LESS Right 06/07/2019    Performed by Beckie Busing, MD at [facility] OR   ? CYSTOURETHROSCOPY WITH URETEROSCOPY AND/ OR PYELOSCOPY - WITH REMOVAL/ MANIPULATION CALCULUS Right 06/07/2019    Performed by Beckie Busing, MD at [facility] OR   ? CATHETER IMPLANT/REVISION  12/27/09    distal end of the catheter was revised   ? CATHETER IMPLANT/REVISION  01/31/10    replacement of ventricular catheter with [employer] framelessstereotaxis catheter   ? HX ABDOMEN SURGERY      fundoplication   ? HX ABDOMEN SURGERY  01/2001  Cecostomy   ? HX BACK SURGERY      repair of spina bifida   ? HX BRAIN SURGERY  5 months old    Chiari decompression   ? HX EAR TUBES     ? HX HERNIA REPAIR      inguinal hernia   ? HX SURGERY  at 2 weeks    VP Shunt   ? HX TONSILLECTOMY     ? HX TRACHEOSTOMY     ? SHUNT REVISION  3 months old   ? SHUNT REVISION  31 years old   ? SHUNT REVISION  October 2010    [place]   ? SHUNT REVISION  12/11/09    replacment of valve to acodman hakem adjustablevalve   ? SHUNT REVISION  02/17/10    left frontal ventriculopleural shunt   ? VENTRICULOSTOMY  02/03/10    removal of all shunt components and placementofright frontal ventriculostomy        Family History:   family history includes Diabetes in his father; High Cholesterol in his maternal grandfather; Hypertension in his father; Other in his father.    Social History:   reports that he has never smoked. He has never used smokeless tobacco. He reports that he does not drink alcohol and does not use drugs.    Allergy:  Allergies   Allergen Reactions   ? Latex RASH and SHORTNESS OF BREATH   ? Amoxicillin RASH and STOMACH UPSET     08/03/17 discussed this w/ patient. Amox/clav in 2014. He certainly took it.  Notes from that time indicate diarrhea. Discussed this with him on 03/13/18 and he stated that he'd taken amox 3 times in past and each time had HA, debilitating fatigue and diarrhea. And maybe rash. At this time he was tolerating IV ampicillin. Manya Silvas, MD 03/13/18  Update Luchi MD, 11/28/18: He got IV ampicillin at [facility] 2/29 to 03/15/18 w/o rash or SE. He was started on Augmentin 05/08/19 without significant SE   ? Ceclor [Cefaclor] HIVES     Pt has tolerated ancef, keflex, ceftriaxone and cefepime (many prescriptions for these in med review in [place].   ? Clindamycin HIVES   ? Zosyn [Piperacillin-Tazobactam] HIVES        Medication:  ? blood sugar diagnostic (ONETOUCH ULTRA TEST) test strip Use one strip as directed daily before breakfast. Diagnosis Code: Uncontrolled DM-2   ? blood-glucose meter (ACCU-CHEK GUIDE GLUCOSE METER) kit Use one strip as directed daily. Diagnosis Code: DM-2   ? carvediloL (COREG) 6.25 mg tablet Take two tablets by mouth twice daily with meals. Take with food.   ? cholecalciferol(+) (VITAMIN D3) 2,000 unit tablet Take one tablet by mouth daily.   ? cyanocobalamin (vitamin B-12) 3,000 mcg cap Take 1 capsule by mouth daily.   ? dulaglutide (TRULICITY) 4.5 mg/0.5 mL injection pen Inject 0.5 mL under the skin every 7 days.   ? fish oil /omega-3 fatty acids (SEA-OMEGA) 340/1000 mg capsule Take one capsule by mouth daily.   ? furosemide (LASIX) 20 mg tablet Take one tablet by mouth daily as needed.   ? lancets 33 gauge 33 gauge Use one each as directed daily.   ? Leg Brace misc Bilateral Fitted leg brace and right shoe for foot drop.  Dx: spina bifida.   ? levETIRAcetam (KEPPRA) 500 mg tablet Take one tablet by mouth twice daily.   ? losartan (COZAAR) 100 mg tablet TAKE 1 TABLET BY MOUTH ONCE DAILY   ? metFORMIN-XR (GLUCOPHAGE XR)  500 mg extended release tablet Take one tablet by mouth twice daily with meals.   ? Miscellaneous Medical Supply misc Shoe lift for right foot.  Dx: spina bifida   ? multivit-min/folic/vit K/lycop (ONE-A-DAY MEN'S MULTIVITAMIN PO) Take 1 tablet by mouth daily.   ? Doctor, general practice 1 piece Urostomy bag.  Use 1 bag as needed.   ? psyllium seed (with sugar) (FIBER PO) Take 2 tablets by mouth daily.   ? rosuvastatin (CRESTOR) 20 mg tablet Take one tablet by mouth daily.   ? spironolactone (ALDACTONE) 25 mg tablet Take two tablets by mouth daily. Take with food   ? triamcinolone acetonide (TRIDERM) 0.1 % topical cream Apply  topically to affected area twice daily. (Patient taking differently: Apply  topically to affected area as Needed.)       Physical Exam  Vitals: There were no vitals taken for this visit.   General:  Oriented x 3, no acute distress, pleasant  Head/EYES/ENT: atramatic, normal, sclera are not icteric   Neck: JVP is flat, no bruits  Cardiovascular:  Normal rate, regular, no murmurs  Lungs: clear to auscultation bilaterally, no wheezes, no crackles, good breath sounds to bases  Abdomen: soft, non distended, positive bowel sounds, no hepatospleenomegaly  Extremities: warm, perfused, no clubbing or cyanosis, no edema  Neurological: grossly intact, no focal deficits, moves all extremities equally   Psych:  Appropriate, mood good, oriented  Skin: some bruises, dry, no sings of infection, no tatoos    Labs and Other Findings:  CMP  Sodium (MMOL/L)   Date Value   10/28/2021 142     Potassium (MMOL/L)   Date Value   10/28/2021 4.5     Chloride (MMOL/L)   Date Value   10/28/2021 102     CO2 (MMOL/L)   Date Value   10/28/2021 29     Anion Gap (no units)   Date Value   10/28/2021 11     Blood Urea Nitrogen (MG/DL)   Date Value   16/10/9602 18     Creatinine (MG/DL)   Date Value   54/09/8117 0.90     Glucose   Date Value   10/28/2021 96 MG/DL   14/78/2956 213 mg/dL (H)     Calcium (MG/DL)   Date Value   08/65/7846 9.9     Phosphorus (MG/DL)   Date Value   96/29/5284 3.9     Albumin (G/DL)   Date Value   13/24/4010 4.5     Total Protein (G/DL)   Date Value   27/25/3664 8.0     Alk Phosphatase (U/L)   Date Value   10/28/2021 90     AST (SGOT) (U/L)   Date Value   10/28/2021 20     ALT (SGPT) (U/L)   Date Value   10/28/2021 34     Total Bilirubin (MG/DL)   Date Value   40/34/7425 0.3     eGFR Non African American (mL/min/1.51m2)   Date Value   10/29/2020 126     eGFR African American (mL/min/1.94m2)   Date Value   07/07/2020 153     BMP:  Sodium (MMOL/L)   Date Value   10/28/2021 142     Potassium (MMOL/L)   Date Value   10/28/2021 4.5     Calcium (MG/DL)   Date Value   95/63/8756 9.9     Chloride (MMOL/L)   Date Value   10/28/2021 102     CO2 (MMOL/L)  Date Value   10/28/2021 29     Anion Gap (no units)   Date Value   10/28/2021 11     Blood Urea Nitrogen (MG/DL)   Date Value   16/10/9602 18     Creatinine (MG/DL)   Date Value   54/09/8117 0.90     Glucose   Date Value   10/28/2021 96 MG/DL   14/78/2956 213 mg/dL (H)     CBC:    Sodium (MMOL/L)   Date Value   10/28/2021 142     Potassium (MMOL/L)   Date Value   10/28/2021 4.5     Chloride (MMOL/L)   Date Value   10/28/2021 102     Glucose   Date Value   10/28/2021 96 MG/DL   08/65/7846 962 mg/dL (H)     Blood Urea Nitrogen (MG/DL)   Date Value   95/28/4132 18     Creatinine (MG/DL)   Date Value   44/01/270 0.90     Calcium (MG/DL)   Date Value   53/66/4403 9.9     Albumin (G/DL)   Date Value   47/42/5956 4.5     Alk Phosphatase (U/L)   Date Value   10/28/2021 90     AST (SGOT) (U/L)   Date Value   10/28/2021 20     CO2 (MMOL/L)   Date Value   10/28/2021 29     ALT (SGPT) (U/L)   Date Value   10/28/2021 34     Anion Gap (no units)   Date Value   10/28/2021 11     eGFR Non African American (mL/min/1.77m2)   Date Value   10/29/2020 126     eGFR African American (mL/min/1.67m2)   Date Value   07/07/2020 153     Thyroid: TSH  TSH (MCU/ML)   Date Value   10/28/2021 2.11     Hgb A1c:   Hemoglobin A1C (%)   Date Value   10/28/2021 8.0 (H)     ANC Panel  Absolute Neutrophil Count (K/UL)   Date Value   07/06/2020 6.12     BNP:  B Type Natriuretic Peptide (PG/ML)   Date Value   11/04/2020 5.0     Uric Acid:  Uric Acid (MG/DL)   Date Value   38/75/6433 6.0        These labs were in your AV Clinic Note already but you did not mention keeping them.  Do you want them deleted?  Creatinine (MG/DL)   Date Value   29/51/8841 0.90     Color,UA (no units)   Date Value   05/01/2019 YELLOW     Turbidity,UA (no units)   Date Value   05/01/2019 2+ (A)     Specific Gravity-Urine (no units)   Date Value   05/01/2019 >1.050 (H)     pH,UA (no units)   Date Value   05/01/2019 6.0     Protein,UA (no units)   Date Value   05/01/2019 1+ (A)     Glucose,UA (no units)   Date Value   05/01/2019 NEG     Ketones,UA (no units)   Date Value   05/01/2019 NEG     Bilirubin,UA (no units)   Date Value   05/01/2019 NEG     Blood,UA (no units)   Date Value   05/01/2019 3+ (A)     Urobilinogen,UA (no units)   Date Value   05/01/2019 NORMAL     Nitrite,UA (no units)  Date Value   05/01/2019 POS (A)     Leukocytes,UA (no units)   Date Value   05/01/2019 3+ (A)     Urine Ascorbic Acid, UA (no units)   Date Value   05/01/2019 NEG     Prostate:   No results found for: PSA  ABG:  pH-Arterial (no units)   Date Value   05/03/2019 7.51 (H)     pCO2-Arterial (MMHG)   Date Value   05/03/2019 29 (L)     pO2-Arterial (MMHG)   Date Value   05/03/2019 108 (H)     Base Deficit-Arterial (MMOL/L)   Date Value   05/02/2019 2.9     O2 Sat-Arterial (%)   Date Value   05/03/2019 98.7     Bicarbonate-ART-Cal (MMOL/L)   Date Value   05/03/2019 25.3     LFTS:  APTT (SEC)   Date Value   08/29/2015 26.6     INR (no units)   Date Value   12/16/2016 1.1     Cholesterol (MG/DL)   Date Value   16/10/9602 161     Triglycerides (MG/DL)   Date Value   54/09/8117 189 (H)     HDL (MG/DL)   Date Value   14/78/2956 39 (L)     LDL (mg/dL)   Date Value   21/30/8657 107 (H)     VLDL (MG/DL)   Date Value   84/69/6295 38     Non HDL Cholesterol (MG/DL)   Date Value   28/41/3244 122         Echo Results:  Results for orders placed during the hospital encounter of 02/27/19    2-D + DOPPLER ECHOCARDIOGRAM    Narrative  ? Technically difficult study; i.v. transpulmonary contrast was used to define the endocardial borders.  ? Normal left ventricular systolic function, estimated ejection fraction is 65%.  ? There are no segmental wall motion abnormalities.  ? Trace mitral and tricuspid valve regurgitation.  ? Pulmonary artery pressure could not be calculated by this study due to incomplete TR signal.    Nuclear Stress Test   This study is abnormal.  Left ventricular systolic function is markedly reduced.  There is diffuse left ventricular hypokinesis with near akinesis of the inferolateral segment.  There does not appear to be evidence of significant active inducible ischemia.  Other than the reduced ejection fraction no other negative prognostic indicators are present.  Viability appears to be preserved throughout the myocardium      Assessment/Plan:  Heart failure with improved ejection fraction  Spina bifida with hydrocephalus  Morbid obesity  Hypertension    Plan:  Patient will be continued on carvedilol 12.5 mg p.o. twice daily, losartan 100 mg p.o. daily, spironolactone 25 mg twice daily.  He does not have much of heart failure symptoms.  We will increase his rosuvastatin to 40 mg p.o. daily but otherwise we will not make any adjustments to his medications.  Given improvement in his overall heart function and his ejection fraction persistently being above 50% including 1 from today we will refer him to general cardiology clinic.  Total time 32 minutes.  Estimated counseling time 17 minutes.  Counseled patient regarding need to lose weight.    Disposition:  Patient Instructions   Thank you for coming to [facility]. Your instructions today:    1. Medication changes:      2. Labs:     3. Test/Procedures planned:     4.  Please log your weight, blood pressure, and heart rate and bring to all visits.    5. Continue with 2 liter/day or 64 ounces/day fluid restriction and 2 gm (2000 mg) sodium restriction/day    6. Return to clinic:      Please call the office with any questions or concerns [phone] (nurse triage). Voicemail is available during regular business hours, calls before 4:00 pm should receive a call back the same day.   After 4 pm and on weekends for emergencies you can reach the ON CALL provider at [phone]      To schedule or change an appointment call [phone].  Fax: [phone]      At the [facility], you and your family are our top priority.  You may receive a survey via email or text message that we are asking you to complete.  We value your feedback to ensure you are satisfied with every visit and we are continually providing the highest quality of patient care.  We know your time is valuable and thank you in advance for completing the survey.    Lab and test results:  As a part of the CARES act, starting 04/12/2019, some results will be released to you via mychart immediately and automatically.  You may see results before your provider sees them; however, your provider will review all these results and then they, or one of their team, will notify you of result information and recommendations.   Critical results will be addressed immediately, but otherwise, please allow [place] time to get back with you prior to you reaching out to [place] for questions.  This will usually take about 72 hours for labs and 5-7 days for procedure test results.      [facility]  Advanced Heart Failure - Silver Team  Kathleen Argue, MD   Bing Matter, MD  Nurse Practitioners: Satira Anis, Fara Boros, Eyvonne Left, and Gates Rigg  RNs: Mick Sell, [place], Leafy Ro, and Vivien Rota               Rosuvastatin 40 mg p.o. daily  We will contact our pharmacist to reach out to your pharmacist at [phone] to consider switching from Trulicity to different GLP-1 agonist in effort to lose weight.  Her blood pressure readings twice a day and send [place] your readings  For you will be following up with Dr. Glean Hess in cardiology clinic            Kathleen Argue, DO   Advanced Heart Failure and Transplant Cardiology  [facility]

## 2021-11-09 ENCOUNTER — Encounter: Admit: 2021-11-09 | Discharge: 2021-11-09 | Payer: MEDICARE

## 2021-11-09 MED ORDER — ROSUVASTATIN 40 MG PO TAB
20 mg | ORAL_TABLET | Freq: Every day | ORAL | 3 refills | 90.00000 days | Status: AC
Start: 2021-11-09 — End: ?

## 2021-11-12 ENCOUNTER — Encounter: Admit: 2021-11-12 | Discharge: 2021-11-12 | Payer: MEDICARE

## 2021-11-25 ENCOUNTER — Ambulatory Visit: Admit: 2021-11-25 | Discharge: 2021-11-25 | Payer: MEDICARE

## 2021-11-25 ENCOUNTER — Encounter: Admit: 2021-11-25 | Discharge: 2021-11-25 | Payer: MEDICARE

## 2021-11-25 DIAGNOSIS — E1165 Type 2 diabetes mellitus with hyperglycemia: Secondary | ICD-10-CM

## 2021-11-25 MED ORDER — LANCETS 33 GAUGE MISC MISC
3 refills | 30.00000 days | Status: AC
Start: 2021-11-25 — End: ?

## 2021-11-25 MED ORDER — ONETOUCH ULTRA TEST MISC STRP
ORAL_STRIP | ORAL | 3 refills | 90.00000 days | Status: AC
Start: 2021-11-25 — End: ?

## 2021-11-25 NOTE — Progress Notes
A follow-up comprehensive medication management visit was completed today via telephone.    Referral reason: Diabetes Management  Referring provider: Orson Gear, MD    Assessment & Plan     Diabetes  Patient's diabetes is controlled as reflected by home blood sugars. Their goal glucose is pre-prandial 80-130 and post-prandial <180. Their most recent A1C was 8% on 10/28/2021 with a goal of < 7%    His reported fasting blood sugars are at goal, however, his most recent A1C is still elevated. Asked him to start checking his blood sugars later in the day to monitor post-prandial blood sugars. Discussed with patient that it would beneficial to switch to a stronger GLP-1 agonist, such as Mounjaro, to help further control his blood sugars and further help with weight loss. He would like to defer this until next visit.     Plan  - Continue Trulicity 4.5 mg once a week   - Continue metformin XR 500 mg twice daily    Hypertension  Patient's blood pressure is controlled per clinic readings. They have not had a microalbumin completed. Their goal blood pressure is < 130/80 mmHg    In clinic blood pressure readings are at goal. No change to regimen warranted.    Dyslipidemia  The patient is on statin therapy. They are on high-intensity statin therapy.  Other lipid lowering agents: no    Therapy is indicated for primary prevention of ASCVD in adults with diabetes.    The current regimen is appropriate. The last LDL was 107 which is not meeting the patient's goal of > 50% reduction from baseline based on 2018 ACC/AHA guidelines. LDL has improved with dose increase to rosuvastatin, can consider further dose increase.     Plan  - Continue rosuvastatin 20 mg daily      Follow-up  The patient will continue to follow up with the pharmacist. Return to pharmacist in 4 weeks via telephone. The return visit was scheduled during today's visit.    Subjective & Objective   At the last visit, the patient was instructed to increase Trulicity to 4.5 mg weekly. The patient states they made all of the recommended changes.     A1C result came back 8.0%. Trulicity dose was increased to 4.5 mg by PCP, which patient confirmed he did. Has taken 3 doses so far.   Saw his cardiologist last month and was recommended that he be switched to a different GLP-1 agonist to help with weight loss.   He reported he has been checking his blood sugars daily in the morning, fasting - 113 this morning. Usually 100s-110s.    Confirmed he is tolerating his Trulicity and metformin well. Denies any nausea, vomiting, or diarrhea.       Working on cutting down soda intake. Working on limiting to just one a day.  He drinks 1 Coke zero daily.  At last visit he was concerned about further increasing his Trulicity dose as he was forgetting to eat. Today reported he has not been having any issues with this.     Diabetes    HPI  Current diabetes medication regimen:   - metformin XR 500 mg twice daily  - Trulicity 4.5 mg once a week on Tuesdays - has taken 3 doses  Previous medications for diabetes:   - metformin IR - diarrhea   - Januvia - switched to Trulicity     Blood Glucose  Monitoring times: fasting  The baseline HbA1C was 9.4 on 07/07/2020.  Diet and Exercise  Number of meals per day: 2  Breakfast: doesn't usually eat breakfast  Lunch: chicken salad  Dinner: mac and cheese, chicken salad, veggies, usually fairly carb heavy   Snacks: sometimes will snack throughout the day - popcorn, cookies, rice crispy, potato chips   Drinks: if not going out and wants something with his meal then will drink soda, but when he goes out will have water  Last meal of the day is usually 5 pm     Hypertension    HPI  Current hypertension medication regimen:   - carvedilol 6.25 mg twice daily  - furosemide 20 mg daily as needed  - losartan 100 mg daily  - spironolactone 50 mg daily    Dyslipidemia    HPI  Current dyslipidemia medication regimen:   - rosuvastatin 20 mg daily  Criteria for statin use: diabetes and LDL > 70mg /dL (age 16-10)    Comorbidities  ASCVD condition(s): none  CKD: no  Diabetes: yes (type 2 and uncontrolled)  Dyslipidemia: yes  Heart failure: no    Obesity: yes    Health Maintenance  Aspirin utilization: Patient is not taking aspirin due to the following reason(s): not indicated.  Statin utilization: Patient is taking a statin.  Hypertension: Patient has hypertension, which is not controlled.    Primary Care - Labs   Basic Metabolic Profile    Lab Results   Component Value Date/Time    NA 142 10/28/2021 02:42 PM    K 4.5 10/28/2021 02:42 PM    CA 9.9 10/28/2021 02:42 PM    CL 102 10/28/2021 02:42 PM    CO2 29 10/28/2021 02:42 PM    GAP 11 10/28/2021 02:42 PM    EGFR1 >60 10/28/2021 02:42 PM    Lab Results   Component Value Date/Time    BUN 18 10/28/2021 02:42 PM    CR 0.90 10/28/2021 02:42 PM    GLU 96 10/28/2021 02:42 PM    GLU 113 (H) 10/29/2020 02:53 PM         Lab Draw:  Lab Results   Component Value Date/Time    HGBA1C 8.0 (H) 10/28/2021 02:42 PM    HGBA1C 8.6 (H) 04/29/2021 12:32 PM    HGBA1C 9.5 (H) 10/29/2020 02:53 PM     POC:  No results found for: A1C     No results found for: MCALB24   Microalbumin/CR ratio Urine   Date Value Ref Range Status   10/28/2021 54.41 (H) <30 ug/mg Final     Comment:     NOTE NEW REFERENCE RANGES     Microalbumin, Random   Date Value Ref Range Status   10/28/2021 50.6 MCG/ML Final        Lab Results   Component Value Date    CHOL 161 10/28/2021    TRIG 189 (H) 10/28/2021    HDL 39 (L) 10/28/2021    LDL 107 (H) 10/28/2021    VLDL 38 10/28/2021    NONHDLCHOL 122 10/28/2021    CHOLHDLC 6.9 (H) 10/29/2020         BP Readings from Last 1 Encounters:   11/05/21 135/85      Medication History  Medication history was not completed because previously completed (follow-up visit).    Adverse Drug Reactions  Adverse drug reactions were reviewed with the patient.    Significant adverse drug reaction(s) were not identified.    Side effect(s) were not reported.    Home Medications    Medication  Sig   blood sugar diagnostic (ONETOUCH ULTRA TEST) test strip Use one strip as directed daily before breakfast. Diagnosis Code: Uncontrolled DM-2   blood-glucose meter (ACCU-CHEK GUIDE GLUCOSE METER) kit Use one strip as directed daily. Diagnosis Code: DM-2   carvediloL (COREG) 6.25 mg tablet Take two tablets by mouth twice daily with meals. Take with food.   cholecalciferol(+) (VITAMIN D3) 2,000 unit tablet Take one tablet by mouth daily.   cyanocobalamin (vitamin B-12) 3,000 mcg cap Take 1 capsule by mouth daily.   dulaglutide (TRULICITY) 4.5 mg/0.5 mL injection pen Inject 0.5 mL under the skin every 7 days.   fish oil /omega-3 fatty acids (SEA-OMEGA) 340/1000 mg capsule Take one capsule by mouth daily.   furosemide (LASIX) 20 mg tablet Take one tablet by mouth daily as needed.   lancets 33 gauge 33 gauge Use one each as directed daily.   Leg Brace misc Bilateral Fitted leg brace and right shoe for foot drop.  Dx: spina bifida.   levETIRAcetam (KEPPRA) 500 mg tablet Take one tablet by mouth twice daily.   losartan (COZAAR) 100 mg tablet TAKE 1 TABLET BY MOUTH ONCE DAILY   metFORMIN-XR (GLUCOPHAGE XR) 500 mg extended release tablet Take one tablet by mouth twice daily with meals.   Miscellaneous Medical Supply misc Shoe lift for right foot.  Dx: spina bifida   multivit-min/folic/vit K/lycop (ONE-A-DAY MEN'S MULTIVITAMIN PO) Take 1 tablet by mouth daily.   Ostomy Supplies 1  misc [employer] 1 piece Urostomy bag.  Use 1 bag as needed.   psyllium seed (with sugar) (FIBER PO) Take 2 tablets by mouth daily.   rosuvastatin (CRESTOR) 40 mg tablet Take one-half tablet by mouth daily.   spironolactone (ALDACTONE) 25 mg tablet Take two tablets by mouth daily. Take with food   triamcinolone acetonide (TRIDERM) 0.1 % topical cream Apply  topically to affected area twice daily.  Patient taking differently: Apply  topically to affected area as Needed.      Education  Education provided: not necessary    Time spent with patient: 16 minutes    Reece Leader, [facility]

## 2021-11-26 ENCOUNTER — Encounter: Admit: 2021-11-26 | Discharge: 2021-11-26 | Payer: MEDICARE

## 2021-11-26 DIAGNOSIS — E1165 Type 2 diabetes mellitus with hyperglycemia: Secondary | ICD-10-CM

## 2021-11-26 MED ORDER — LANCETS 33 GAUGE MISC MISC
3 refills | 30.00000 days | Status: AC
Start: 2021-11-26 — End: ?
  Filled 2021-11-27: qty 100, 90d supply, fill #1

## 2021-11-26 MED ORDER — ONETOUCH ULTRA TEST MISC STRP
ORAL_STRIP | ORAL | 3 refills | 90.00000 days | Status: AC
Start: 2021-11-26 — End: ?
  Filled 2021-11-27: qty 50, 50d supply, fill #1

## 2021-11-26 NOTE — Progress Notes
Reordered for patient. Patient notified via Mychart.

## 2021-11-27 ENCOUNTER — Encounter: Admit: 2021-11-27 | Discharge: 2021-11-27 | Payer: MEDICARE

## 2021-12-23 ENCOUNTER — Ambulatory Visit: Admit: 2021-12-23 | Discharge: 2021-12-23 | Payer: MEDICARE

## 2021-12-23 ENCOUNTER — Encounter: Admit: 2021-12-23 | Discharge: 2021-12-23 | Payer: MEDICARE

## 2021-12-23 DIAGNOSIS — E1165 Type 2 diabetes mellitus with hyperglycemia: Secondary | ICD-10-CM

## 2021-12-23 MED ORDER — MOUNJARO 5 MG/0.5 ML SC PNIJ
5 mg | SUBCUTANEOUS | 0 refills | Status: AC
Start: 2021-12-23 — End: ?
  Filled 2021-12-24: qty 2, 28d supply, fill #1

## 2021-12-23 NOTE — Progress Notes
A follow-up comprehensive medication management visit was completed today via telephone.    Referral reason: Diabetes Management  Referring provider: Orson Gear, MD    Assessment & Plan     Diabetes  Patient's diabetes is controlled as reflected by home blood sugars. Their goal glucose is pre-prandial 80-130 and post-prandial <180. Their most recent A1C was 8% on 10/28/2021 with a goal of < 7%    Having elevated post-prandial, at max dose of Trulicity. Discussed switching to a different once a week GLP-1 agonist for additional BG lowering and weight loss benefit and patient is agreeable with changing to [facility] if insurance covers.     Plan  - SWITCH to [facility] 5 mg once a week - okay to finish up last dose of Trulicity next Tuesday  - Continue metformin XR 500 mg twice daily    Hypertension  Patient's blood pressure is controlled per clinic readings. They have not had a microalbumin completed. Their goal blood pressure is < 130/80 mmHg    In clinic blood pressure readings are at goal. No change to regimen warranted.    Dyslipidemia  The patient is on statin therapy. They are on high-intensity statin therapy.  Other lipid lowering agents: no    Therapy is indicated for primary prevention of ASCVD in adults with diabetes.    The current regimen is appropriate. was 107 mg/dL which is not meeting the patient's goal of > 50% reduction from baseline based on 2018 ACC/AHA guidelines. LDL has improved with dose increase to rosuvastatin, can consider further dose increase.     Plan  - Continue rosuvastatin 20 mg daily      Follow-up  The patient will continue to follow up with the pharmacist. Return to pharmacist in 4 weeks via telephone. The return visit was scheduled during today's visit.    Subjective & Objective   At the last visit, the patient was instructed to con't Trulicity to 4.5 mg weekly. The patient states they made all of the recommended changes.     Reported things have been then same. No side effects with Trulicity.  Regularly checking blood sugars fasting in the morning and before dinner. Sometimes will check closer to 2 hours after lunch. It is usually high then.  Reported some post-prandials have been lower than his fasting.   120s-130s - fasting   130s-140s mg/dL - right before dinner  236, 253 - closer to 2 hours after lunch   Lowest 105 mg/dL over the past month    Appetite is back to normal, eating 2-3 meals a day. Lunch is usually 12:45 - dinner is around 5-5:30      Working on cutting down soda intake. Working on limiting to just one a day.  He drinks 1 Coke zero daily.    Diabetes    HPI  Current diabetes medication regimen:   - metformin XR 500 mg twice daily  - Trulicity 4.5 mg once a week on Tuesdays   Previous medications for diabetes:   - metformin IR - diarrhea   - Januvia - switched to Trulicity     Blood Glucose  Monitoring times: fasting  The baseline HbA1C was 9.4 on 07/07/2020.    Diet and Exercise  Number of meals per day: 2  Breakfast: doesn't usually eat breakfast  Lunch: chicken salad  Dinner: mac and cheese, chicken salad, veggies, usually fairly carb heavy   Snacks: sometimes will snack throughout the day - popcorn, cookies, rice crispy, potato chips  Drinks: if not going out and wants something with his meal then will drink soda, but when he goes out will have water  Last meal of the day is usually 5 pm     Hypertension    HPI  Current hypertension medication regimen:   - carvedilol 6.25 mg twice daily  - furosemide 20 mg daily as needed  - losartan 100 mg daily  - spironolactone 50 mg daily    Dyslipidemia    HPI  Current dyslipidemia medication regimen:   - rosuvastatin 20 mg daily  Criteria for statin use: diabetes and LDL > 70mg /dL (age 16-10)    Comorbidities  ASCVD condition(s): none  CKD: no  Diabetes: yes (type 2 and uncontrolled)  Dyslipidemia: yes  Heart failure: no    Obesity: yes    Health Maintenance  Aspirin utilization: Patient is not taking aspirin due to the following reason(s): not indicated.  Statin utilization: Patient is taking a statin.  Hypertension: Patient has hypertension, which is not controlled.    Primary Care - Labs   Basic Metabolic Profile    Lab Results   Component Value Date/Time    NA 142 10/28/2021 02:42 PM    K 4.5 10/28/2021 02:42 PM    CA 9.9 10/28/2021 02:42 PM    CL 102 10/28/2021 02:42 PM    CO2 29 10/28/2021 02:42 PM    GAP 11 10/28/2021 02:42 PM    EGFR1 >60 10/28/2021 02:42 PM    Lab Results   Component Value Date/Time    BUN 18 10/28/2021 02:42 PM    CR 0.90 10/28/2021 02:42 PM    GLU 96 10/28/2021 02:42 PM    GLU 113 (H) 10/29/2020 02:53 PM         Lab Draw:  Lab Results   Component Value Date/Time    HGBA1C 8.0 (H) 10/28/2021 02:42 PM    HGBA1C 8.6 (H) 04/29/2021 12:32 PM    HGBA1C 9.5 (H) 10/29/2020 02:53 PM     POC:  No results found for: A1C     No results found for: MCALB24   Microalbumin/CR ratio Urine   Date Value Ref Range Status   10/28/2021 54.41 (H) <30 ug/mg Final     Comment:     NOTE NEW REFERENCE RANGES     Microalbumin, Random   Date Value Ref Range Status   10/28/2021 50.6 MCG/ML Final        Lab Results   Component Value Date    CHOL 161 10/28/2021    TRIG 189 (H) 10/28/2021    HDL 39 (L) 10/28/2021    LDL 107 (H) 10/28/2021    VLDL 38 10/28/2021    NONHDLCHOL 122 10/28/2021    CHOLHDLC 6.9 (H) 10/29/2020         BP Readings from Last 1 Encounters:   11/05/21 135/85      Medication History  Medication history was not completed because previously completed (follow-up visit).    Adverse Drug Reactions  Adverse drug reactions were reviewed with the patient.    Significant adverse drug reaction(s) were not identified.    Side effect(s) were not reported.    Home Medications    Medication Sig   blood sugar diagnostic (ONETOUCH ULTRA TEST) test strip Use to check blood sugars daily. E11.65   blood-glucose meter (ACCU-CHEK GUIDE GLUCOSE METER) kit Use one strip as directed daily. Diagnosis Code: DM-2   carvediloL (COREG) 6.25 mg tablet Take two tablets by mouth twice daily  with meals. Take with food.   cholecalciferol(+) (VITAMIN D3) 2,000 unit tablet Take one tablet by mouth daily.   cyanocobalamin (vitamin B-12) 3,000 mcg cap Take 1 capsule by mouth daily.   dulaglutide (TRULICITY) 4.5 mg/0.5 mL injection pen Inject 0.5 mL under the skin every 7 days.   fish oil /omega-3 fatty acids (SEA-OMEGA) 340/1000 mg capsule Take one capsule by mouth daily.   furosemide (LASIX) 20 mg tablet Take one tablet by mouth daily as needed.   lancets 33 gauge 33 gauge Use to check blood sugars daily E11.65   Leg Brace misc Bilateral Fitted leg brace and right shoe for foot drop.  Dx: spina bifida.   levETIRAcetam (KEPPRA) 500 mg tablet Take one tablet by mouth twice daily.   losartan (COZAAR) 100 mg tablet TAKE 1 TABLET BY MOUTH ONCE DAILY   metFORMIN-XR (GLUCOPHAGE XR) 500 mg extended release tablet Take one tablet by mouth twice daily with meals.   Miscellaneous Medical Supply misc Shoe lift for right foot.  Dx: spina bifida   multivit-min/folic/vit K/lycop (ONE-A-DAY MEN'S MULTIVITAMIN PO) Take 1 tablet by mouth daily.   Ostomy Supplies 1  misc [employer] 1 piece Urostomy bag.  Use 1 bag as needed.   psyllium seed (with sugar) (FIBER PO) Take 2 tablets by mouth daily.   rosuvastatin (CRESTOR) 40 mg tablet Take one-half tablet by mouth daily.   spironolactone (ALDACTONE) 25 mg tablet Take two tablets by mouth daily. Take with food   triamcinolone acetonide (TRIDERM) 0.1 % topical cream Apply  topically to affected area twice daily.  Patient taking differently: Apply  topically to affected area as Needed.      Education  Education provided: not necessary    Time spent with patient: 15 minutes    Reece Leader, [facility]

## 2021-12-24 ENCOUNTER — Encounter: Admit: 2021-12-24 | Discharge: 2021-12-24 | Payer: MEDICARE

## 2021-12-24 DIAGNOSIS — E1165 Type 2 diabetes mellitus with hyperglycemia: Secondary | ICD-10-CM

## 2021-12-24 MED ORDER — MOUNJARO 5 MG/0.5 ML SC PNIJ
5 mg | SUBCUTANEOUS | 0 refills | Status: CN
Start: 2021-12-24 — End: ?

## 2022-01-05 ENCOUNTER — Encounter: Admit: 2022-01-05 | Discharge: 2022-01-05 | Payer: MEDICARE

## 2022-01-05 DIAGNOSIS — I428 Other cardiomyopathies: Secondary | ICD-10-CM

## 2022-01-05 DIAGNOSIS — I10 Essential (primary) hypertension: Secondary | ICD-10-CM

## 2022-01-05 MED ORDER — SPIRONOLACTONE 25 MG PO TAB
50 mg | ORAL_TABLET | Freq: Every day | ORAL | 0 refills | 90.00000 days | Status: AC
Start: 2022-01-05 — End: ?

## 2022-01-10 ENCOUNTER — Encounter: Admit: 2022-01-10 | Discharge: 2022-01-10 | Payer: MEDICARE

## 2022-01-19 ENCOUNTER — Encounter: Admit: 2022-01-19 | Discharge: 2022-01-19 | Payer: MEDICARE

## 2022-01-19 ENCOUNTER — Ambulatory Visit: Admit: 2022-01-19 | Discharge: 2022-01-19 | Payer: MEDICARE

## 2022-01-19 DIAGNOSIS — F988 Other specified behavioral and emotional disorders with onset usually occurring in childhood and adolescence: Secondary | ICD-10-CM

## 2022-01-19 DIAGNOSIS — R569 Unspecified convulsions: Secondary | ICD-10-CM

## 2022-01-19 DIAGNOSIS — Z01810 Encounter for preprocedural cardiovascular examination: Secondary | ICD-10-CM

## 2022-01-19 DIAGNOSIS — I10 Essential (primary) hypertension: Secondary | ICD-10-CM

## 2022-01-19 DIAGNOSIS — N319 Neuromuscular dysfunction of bladder, unspecified: Secondary | ICD-10-CM

## 2022-01-19 DIAGNOSIS — Q0700 Arnold-Chiari syndrome without spina bifida or hydrocephalus: Secondary | ICD-10-CM

## 2022-01-19 DIAGNOSIS — Q059 Spina bifida, unspecified: Secondary | ICD-10-CM

## 2022-01-19 DIAGNOSIS — I5042 Chronic combined systolic (congestive) and diastolic (congestive) heart failure: Secondary | ICD-10-CM

## 2022-01-19 DIAGNOSIS — E119 Type 2 diabetes mellitus without complications: Secondary | ICD-10-CM

## 2022-01-19 DIAGNOSIS — R339 Retention of urine, unspecified: Secondary | ICD-10-CM

## 2022-01-19 DIAGNOSIS — N200 Calculus of kidney: Secondary | ICD-10-CM

## 2022-01-19 DIAGNOSIS — T85618A Breakdown (mechanical) of other specified internal prosthetic devices, implants and grafts, initial encounter: Secondary | ICD-10-CM

## 2022-01-19 DIAGNOSIS — G919 Hydrocephalus, unspecified: Secondary | ICD-10-CM

## 2022-01-19 DIAGNOSIS — I429 Cardiomyopathy, unspecified: Secondary | ICD-10-CM

## 2022-01-19 DIAGNOSIS — N390 Urinary tract infection, site not specified: Secondary | ICD-10-CM

## 2022-01-19 DIAGNOSIS — R51 Headache: Secondary | ICD-10-CM

## 2022-01-19 DIAGNOSIS — I428 Other cardiomyopathies: Secondary | ICD-10-CM

## 2022-01-19 DIAGNOSIS — G4733 Obstructive sleep apnea (adult) (pediatric): Secondary | ICD-10-CM

## 2022-01-19 DIAGNOSIS — R000 Tachycardia, unspecified: Secondary | ICD-10-CM

## 2022-01-19 NOTE — Patient Instructions
Follow-Up:    -Thank you for allowing [place] to participate in your care today. Your After Visit Summary is being completed by Lieutenant Diego, RN.    -We would like you to follow up in  1 years with Modena Jansky, DO  -The schedule is released approximately 4-5 months in advance. You will be called by our scheduling department to make an appointment and you will also receive a notification via MyChart to self-schedule.  However, if you would like to call to make this appointment, please call [phone].    - Please have a fasting cholesterol lab drawn. Below are  outpatient lab locations that do not require an appointment. You can walk in during the times below:     [facility] ([address] [place], [place], [place]): M-F 7:30am-4:30pm; Avoid noon-1pm     [facility] ([address] [place] [address], [place], [place]): M-F 7a-6p; Saturday 7a-12p     [facility] ([address], [place], [place]) M-F 7:00a-5:30p;  Sat and Sun 7a-12p. Doors do not open until 7:00am.     [facility] ([address] [place], [place]) M-F 8a-5p     [facility] ([address], [place], [place]) M-F 8a-4:30p    [facility] ([address] [place], [place]) M-F 8a-4p        Contacting our office:    -For NON-URGENT questions please contact us via message through your MyChart account.   -For all medication refills please contact your pharmacy or send a request through MyChart.     -For all questions that may need to be addressed urgently please call the [facility] nursing triage line at [phone] Monday - Friday 8am-5pm only. Please leave a detailed message with your name, date of birth, and reason for your call.  If your message is received before 3:30pm, every effort will be made to call you back the same day.  Please allow time for [place] to review your chart prior to call back.     -Should you have an urgent concern over the weekend/nights, the on-call triage line is [phone].    [facility] team fax number: [phone]    -You may receive a survey in the upcoming weeks from [facility]. Your feedback is important to [place] and helps [place] continue to improve patient care and patient satisfaction.     -Please feel free to call our Financial Department at [phone] with any questions or concerns about estimated cost of testing or imaging ordered today. We are happy to provide CPT codes upon request.    Results & Testing Follow Up:    -Please allow 5-7 business days for the results of any testing to be reviewed. Please call our office if you have not heard from a nurse within this time frame.    -Should you choose to complete testing at an outside facility, please contact our office after completion of testing so that we can ensure that we have received results for your provider to review.    Lab and test results:  As a part of the CARES act, starting 04/12/2019, some results will be released to you via MyChart immediately and automatically.  You may see results before your provider sees them; however, your provider will review all these results and then they, or one of their team, will notify you of result information and recommendations.  Critical results will be addressed immediately, but otherwise, please allow [place] time to get back with you prior to you reaching out to [place] for questions.  This will usually take about 72 hours for labs and 5-7 days for procedure test results.

## 2022-01-19 NOTE — Progress Notes
Cardiology Consultation Note     Date of Service: 01/19/2022    History of Present Illness          History of present illness: Allen Gill is a very pleasant 32 y.o. male who is being seen at the [facility] [place] Cardiovascular [facility] Department at the [facility] office.      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    He presents today for follow-up.  Overall, he states that he is doing well.  He recently was switched from Trulicity to [place].  He has taken [facility] for the last 2 weeks.  He has not yet noticed any significant weight loss.  His weight is up 7 pounds since last clinic visit in October 2023.  He does not notice any overt volume retention, lower extremity edema, orthopnea/PND and feels that the weight gain is likely caloric mediated.  He does not exercise nor is he overtly active.  He states that he does not have motivation to exercise but wants to try to be more active.  He is not routinely measuring his blood pressure at home.    Cardiac History: Patient was admitted to the ICU with urosepsis in 2017, echocardiogram revealed an EF of 25%.  He had subsequent pharmacologic MPI study that did not demonstrate any inducible ischemia.  He was started on GDMT and had subsequent recovery of his EF on echocardiogram from 2019.  He has not had any hospitalizations for decompensated heart failure.    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 There are no regional wall motion abnormalities present though limited visualization of endocardium.  Normal diastolic function.  The right ventricle is poorly seen, grossly appears normal in size, probably normal function.  The left and right atria are poorly seen, probably normal in size (image 37)  Valve structures are poorly seen, no significant valvular stenosis or regurgitation.  Inadequate tricuspid regurgitation signal, unable to accurately estimate PA systolic pressure with this study.  Comparison is made to prior study of 02/27/19, there are no significant changes, LVEF previously ~55%    Stress Test 08/03/2016:  This study is abnormal.  Left ventricular systolic function is markedly reduced.  There is diffuse left ventricular hypokinesis with near akinesis of the inferolateral segment.    There does not appear to be evidence of significant active inducible ischemia.    Other than the reduced ejection fraction no other negative prognostic indicators are present.    Viability appears to be preserved throughout the myocardium.       Most recent results for 12-Lead ECG   ECG 12-LEAD    Collection Time: 11/04/20  1:57 PM   Result Value Status    VENTRICULAR RATE 87 Final    P-R INTERVAL 154 Final    QRS DURATION 92 Final    Q-T INTERVAL 344 Final    QTC CALCULATION (BAZETT) 413 Final    P AXIS 27 Final    R AXIS -29 Final    T AXIS 18 Final    Impression    Normal sinus rhythm  Confirmed by Hannen, Marina ([id number]) on 11/17/2020 8:49:03 PM     *Note: Due  to a large number of results and/or encounters for the requested time period, some results have not been displayed. A complete set of results can be found in Results Review.          Review of Systems   Constitutional: Negative.   HENT: Negative.     Eyes: Negative.    Cardiovascular: Negative.    Respiratory: Negative.     Endocrine: Negative.    Hematologic/Lymphatic: Negative.    Skin: Negative.    Musculoskeletal: Negative.    Genitourinary: Negative.    Neurological: Negative.    Psychiatric/Behavioral: Negative.     Allergic/Immunologic: Negative.      Current Medications (including today's revisions)   blood sugar diagnostic (ONETOUCH ULTRA TEST) test strip Use to check blood sugars daily. E11.65    blood-glucose meter (ACCU-CHEK GUIDE GLUCOSE METER) kit Use one strip as directed daily. Diagnosis Code: DM-2    carvediloL (COREG) 6.25 mg tablet Take two tablets by mouth twice daily with meals. Take with food.    cholecalciferol(+) (VITAMIN D3) 2,000 unit tablet Take one tablet by mouth daily.    cyanocobalamin (vitamin B-12) 3,000 mcg cap Take 1 capsule by mouth daily.    fish oil /omega-3 fatty acids (SEA-OMEGA) 340/1000 mg capsule Take one capsule by mouth daily.    furosemide (LASIX) 20 mg tablet Take one tablet by mouth daily as needed.    lancets 33 gauge 33 gauge Use to check blood sugars daily E11.65    Leg Brace misc Bilateral Fitted leg brace and right shoe for foot drop.  Dx: spina bifida.    levETIRAcetam (KEPPRA) 500 mg tablet Take one tablet by mouth twice daily.    losartan (COZAAR) 100 mg tablet TAKE 1 TABLET BY MOUTH ONCE DAILY    metFORMIN-XR (GLUCOPHAGE XR) 500 mg extended release tablet Take one tablet by mouth twice daily with meals.    Miscellaneous Medical Supply misc Shoe lift for right foot.  Dx: spina bifida    multivit-min/folic/vit K/lycop (ONE-A-DAY MEN'S MULTIVITAMIN PO) Take 1 tablet by mouth daily.    Ostomy Supplies 1  misc [employer] 1 piece Urostomy bag.  Use 1 bag as needed.    psyllium seed (with sugar) (FIBER PO) Take 2 tablets by mouth daily.    rosuvastatin (CRESTOR) 40 mg tablet Take one-half tablet by mouth daily. (Patient taking differently: Take one tablet by mouth daily.)    spironolactone (ALDACTONE) 25 mg tablet take 2 tablets BY MOUTH ONCE daily WITH FOOD    tirzepatide (MOUNJARO) 5 mg/0.5 mL injector EN Inject 0.5 mL under the skin every 7 days.    triamcinolone acetonide (TRIDERM) 0.1 % topical cream Apply  topically to affected area twice daily. (Patient taking differently: Apply  topically to affected area as Needed.) Allergies  Allergies   Allergen Reactions    Latex RASH and SHORTNESS OF BREATH    Amoxicillin RASH and STOMACH UPSET     08/03/17 discussed this w/ patient. Amox/clav in 2014. He certainly took it.  Notes from that time indicate diarrhea. Discussed this with him on 03/13/18 and he stated that he'd taken amox 3 times in past and each time had HA, debilitating fatigue and diarrhea. And maybe rash. At this time he was tolerating IV ampicillin. Manya Silvas, MD 03/13/18  Update Luchi MD, 11/28/18: He got IV ampicillin at [facility] 2/29 to 03/15/18 w/o rash or SE. He was started on Augmentin 05/08/19 without significant SE    Ceclor [Cefaclor] HIVES  Pt has tolerated ancef, keflex, ceftriaxone and cefepime (many prescriptions for these in med review in [place].    Clindamycin HIVES    Zosyn [Piperacillin-Tazobactam] HIVES        Objective     Vital Signs This Visit  Vitals:    01/19/22 1335   BP: 122/72   BP Source: Arm, Left Upper   Pulse: 114   SpO2: 97%   O2 Device: None (Room air)   PainSc: Zero   Weight: 127.5 kg (281 lb)   Height: 157.5 cm (5' 2)       Physical Exam  General Appearance: No acute distress. Fully alert and oriented.  Skin: Warm. No ulcers or xanthomas.   HEENT: Grossly unremarkable. Lips and oral mucosa without pallor or cyanosis. Moist mucous membranes.   Neck Veins: Normal jugular venous pressure. Neck veins are not distended.  Auscultation/Percussion: Normal respiratory effort. Lungs clear to auscultation bilaterally. No wheezes, rales, or rhonchi.    Cardiac Rhythm: Regular rhythm. Normal rate.  Cardiac Auscultation: Normal S1 & S2. No S3 or S4. No rub.  Murmurs: No cardiac murmurs.  Peripheral Circulation: Normal peripheral circulation.   Extremities: Appropriately warm to touch. No lower extremity edema.  Abdominal Exam: Urostomy bag in place  Neurologic Exam: Neurological assessment grossly intact.    Cardiovascular Health Factors  Vitals BP Readings from Last 3 Encounters:   01/19/22 122/72   11/05/21 135/85 10/28/21 118/76     Wt Readings from Last 3 Encounters:   01/19/22 127.5 kg (281 lb)   11/05/21 124.3 kg (274 lb)   10/28/21 125.5 kg (276 lb 11.2 oz)     BMI Readings from Last 3 Encounters:   01/19/22 51.40 kg/m?   11/05/21 47.03 kg/m?   10/28/21 47.82 kg/m?      Smoking Social History     Tobacco Use   Smoking Status Never   Smokeless Tobacco Never      Lipid Profile Cholesterol   Date Value Ref Range Status   10/28/2021 161 <200 MG/DL Final     HDL   Date Value Ref Range Status   10/28/2021 39 (L) >40 MG/DL Final     LDL   Date Value Ref Range Status   10/28/2021 107 (H) <100 mg/dL Final     Triglycerides   Date Value Ref Range Status   10/28/2021 189 (H) <150 MG/DL Final      Blood Sugar Hemoglobin A1C   Date Value Ref Range Status   10/28/2021 8.0 (H) 4.0 - 5.7 % Final     Comment:     The ADA recommends that most patients with type 1 and type 2 diabetes maintain   an A1c level <7%.       Glucose   Date Value Ref Range Status   10/28/2021 96 70 - 100 MG/DL Final   16/10/9602 540 (H) 70 - 100 MG/DL Final   98/11/9145 829 (H) 70 - 100 MG/DL Final   56/21/3086 578 (H) 65 - 99 mg/dL Final     Comment:                   Fasting reference interval     For someone without known diabetes, a glucose value  between 100 and 125 mg/dL is consistent with  prediabetes and should be confirmed with a  follow-up test.        07/07/2020 143 (H) 65 - 99 mg/dL Final     Comment:  Fasting reference interval     For someone without known diabetes, a glucose  value >125 mg/dL indicates that they may have  diabetes and this should be confirmed with a  follow-up test.        05/28/2020 176 (H) 65 - 99 mg/dL Final     Comment:                   Fasting reference interval     For someone without known diabetes, a glucose  value >125 mg/dL indicates that they may have  diabetes and this should be confirmed with a  follow-up test.          Glucose, POC   Date Value Ref Range Status   05/08/2019 101 (H) 70 - 100 MG/DL Final   13/08/6576 469 (H) 70 - 100 MG/DL Final   62/95/2841 324 (H) 70 - 100 MG/DL Final      10 Year ASCVD Risk The ASCVD Risk score (Arnett DK, et al., 2019) failed to calculate for the following reasons:    The 2019 ASCVD risk score is only valid for ages 54 to 44          Assessment and Plan        Allen Gill is a 32 y.o. 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  From a cardiac standpoint he is stable without objective evidence of heart failure.  He has ACC/AHA stage C with NYHA class II symptoms.  He is euvolemic.  Most recent echocardiogram demonstrates an EF of 50-55%.  He does not routinely take Lasix.  Will continue his current GDMT which includes carvedilol 6.25 mg twice daily, losartan 100 mg daily, spironolactone 25 mg daily.  He does have recurrent pyelonephritis and UTI, likely would not be a candidate for SGLT2 inhibitor.  We discussed  discussed dietary compliance with low sodium diet.  Patient was instructed to weigh himself regularly.    Patient follows with PCP and pharmacy for management of obesity and was recently started on Mounjaro.  We discussed lifestyle modifications to assist with weight loss.  His primary inhibitor for increased exercise is motivation.  We discussed different strategies to increase activity and exercise.  I recommended that he aim for 150 minutes a week of moderate intensity activity including cardio as well as weight-based exercises.    His blood pressure is well-controlled today.  Will make no changes to any of his medications.    His most recent cholesterol level showed an LDL of 107 as well as a triglyceride of 189.  Since then his rosuvastatin was increased to 40 mg.  Will repeat a fasting lipid panel to reassess his cholesterol levels.    Thank you for allowing [place] to care for this patient. If there are any questions or concerns, please don't hesitate to contact us.     We will see the patient back in return in in 1 year(s) time, or sooner if needed.     Modena Jansky,  DO  Staff Cardiologist  Department of Cardiovascular Medicine  [facility]  Contact via [place] or Pager #: [phone]    Total time spent on today's office visit was .  This includes over 50% face-to-face in person visit with patient as well as nonface-to-face time including review of the EMR, outside records, labs, radiologic studies, echocardiogram & other cardiovascular studies, formation of treatment plan, after visit summary, future disposition, and lastly on documentation.    This note was in part completed with Dragon, a Chemical engineer. Some grammatical errors may have occurred. If you have concerns,please contact my office for clarification.

## 2022-01-20 ENCOUNTER — Ambulatory Visit: Admit: 2022-01-20 | Discharge: 2022-01-20 | Payer: MEDICARE

## 2022-01-20 ENCOUNTER — Encounter: Admit: 2022-01-20 | Discharge: 2022-01-20 | Payer: MEDICARE

## 2022-01-20 DIAGNOSIS — E1165 Type 2 diabetes mellitus with hyperglycemia: Secondary | ICD-10-CM

## 2022-01-20 DIAGNOSIS — I428 Other cardiomyopathies: Secondary | ICD-10-CM

## 2022-01-20 DIAGNOSIS — I10 Essential (primary) hypertension: Secondary | ICD-10-CM

## 2022-01-20 DIAGNOSIS — E119 Type 2 diabetes mellitus without complications: Secondary | ICD-10-CM

## 2022-01-20 DIAGNOSIS — I5042 Chronic combined systolic (congestive) and diastolic (congestive) heart failure: Secondary | ICD-10-CM

## 2022-01-20 LAB — LIPID PROFILE
CHOLESTEROL: 153 mg/dL (ref <200)
HDL: 33 mg/dL — ABNORMAL LOW (ref >40)
LDL: 100 mg/dL — ABNORMAL HIGH (ref <100)
NON HDL CHOLESTEROL: 120 mg/dL
TRIGLYCERIDES: 229 mg/dL — ABNORMAL HIGH (ref <150)
VLDL: 46 mg/dL

## 2022-01-20 MED ORDER — MOUNJARO 7.5 MG/0.5 ML SC PNIJ
7.5 mg | SUBCUTANEOUS | 0 refills | Status: AC
Start: 2022-01-20 — End: ?
  Filled 2022-01-21: qty 2, 28d supply, fill #1

## 2022-01-20 NOTE — Progress Notes
A follow-up comprehensive medication management visit was completed today via telephone.    Referral reason: Diabetes Management  Referring provider: Orson Gear, MD    Assessment & Plan     Diabetes  Patient's diabetes is controlled as reflected by home blood sugars. Their goal glucose is pre-prandial 80-130 and post-prandial <180. Their most recent A1C was 8% on 10/28/2021 with a goal of < 7%    Doing well with switching from Trulicity to [facility] with no side effects. Blood sugars are still elevated so will increase dose once he finishes 4 weeks at current dose.    Plan  - INCREASE Mounjaro 7.5 mg once a week on Tuesdays  - Continue metformin XR 500 mg twice daily    Hypertension  Patient's blood pressure is controlled per clinic readings. They have not had a microalbumin completed. Their goal blood pressure is < 130/80 mmHg    In clinic blood pressure readings are at goal. No change to regimen warranted.    Dyslipidemia  The patient is on statin therapy. They are on high-intensity statin therapy.  Other lipid lowering agents: no    Therapy is indicated for primary prevention of ASCVD in adults with diabetes.    The current regimen is appropriate. was 107 mg/dL which is not meeting the patient's goal of > 50% reduction from baseline based on 2018 ACC/AHA guidelines. LDL has improved with dose increase to rosuvastatin, can consider further dose increase.     Plan  - Continue rosuvastatin 20 mg daily      Follow-up  The patient will continue to follow up with the pharmacist. Return to pharmacist in 4 weeks via telephone. The return visit was scheduled during today's visit.    Subjective & Objective   At the last visit, the patient was instructed to switch to [facility] 5 mg once a week. The patient states they made all of the recommended changes.     Reported he has tolerated the switch from Trulicity to [facility], however, thinks the dose needs to be increased.   He has taken 3 doses so far, has one 5 mg dose left for next week.  Not having any nausea, diarrhea, constipation. It's helping him go to the bathroom better compared to the Trulicity.  Reported the lowest his blood sugars he's seen was 112 mg/dl, last week.  His average is about 135-140 and highest was 200s.    Appetite is still doing good, no concerns with lack of appetite. Eating 2-3 meals a day. Lunch is usually 12:45 - dinner is around 5-5:30     Diabetes    HPI  Current diabetes medication regimen:   - metformin XR 500 mg twice daily  - Mounjary 5 mg once a week on Tuesdays  Previous medications for diabetes:   - Trulicity - switched to [employer]   - metformin IR - diarrhea   - Januvia - switched to Trulicity     Blood Glucose  Monitoring times: fasting  The baseline HbA1C was 9.4 on 07/07/2020.    Diet and Exercise  Number of meals per day: 2  Breakfast: doesn't usually eat breakfast  Lunch: chicken salad  Dinner: mac and cheese, chicken salad, veggies, usually fairly carb heavy   Snacks: sometimes will snack throughout the day - popcorn, cookies, rice crispy, potato chips   Drinks: if not going out and wants something with his meal then will drink soda, but when he goes out will have water  Last meal of the  day is usually 5 pm     Hypertension    HPI  Current hypertension medication regimen:   - carvedilol 6.25 mg twice daily  - furosemide 20 mg daily as needed  - losartan 100 mg daily  - spironolactone 50 mg daily    Dyslipidemia    HPI  Current dyslipidemia medication regimen:   - rosuvastatin 20 mg daily  Criteria for statin use: diabetes and LDL > 70mg /dL (age 34-62)    Comorbidities  ASCVD condition(s): none  CKD: no  Diabetes: yes (type 2 and uncontrolled)  Dyslipidemia: yes  Heart failure: no    Obesity: yes    Health Maintenance  Aspirin utilization: Patient is not taking aspirin due to the following reason(s): not indicated.  Statin utilization: Patient is taking a statin.  Hypertension: Patient has hypertension, which is not controlled.    Primary Care - Labs   Basic Metabolic Profile    Lab Results   Component Value Date/Time    NA 142 10/28/2021 02:42 PM    K 4.5 10/28/2021 02:42 PM    CA 9.9 10/28/2021 02:42 PM    CL 102 10/28/2021 02:42 PM    CO2 29 10/28/2021 02:42 PM    GAP 11 10/28/2021 02:42 PM    EGFR1 >60 10/28/2021 02:42 PM    Lab Results   Component Value Date/Time    BUN 18 10/28/2021 02:42 PM    CR 0.90 10/28/2021 02:42 PM    GLU 96 10/28/2021 02:42 PM    GLU 113 (H) 10/29/2020 02:53 PM         Lab Draw:  Lab Results   Component Value Date/Time    HGBA1C 8.0 (H) 10/28/2021 02:42 PM    HGBA1C 8.6 (H) 04/29/2021 12:32 PM    HGBA1C 9.5 (H) 10/29/2020 02:53 PM     POC:  No results found for: A1C     No results found for: MCALB24   Microalbumin/CR ratio Urine   Date Value Ref Range Status   10/28/2021 54.41 (H) <30 ug/mg Final     Comment:     NOTE NEW REFERENCE RANGES     Microalbumin, Random   Date Value Ref Range Status   10/28/2021 50.6 MCG/ML Final        Lab Results   Component Value Date    CHOL 161 10/28/2021    TRIG 189 (H) 10/28/2021    HDL 39 (L) 10/28/2021    LDL 107 (H) 10/28/2021    VLDL 38 10/28/2021    NONHDLCHOL 122 10/28/2021    CHOLHDLC 6.9 (H) 10/29/2020         BP Readings from Last 1 Encounters:   01/19/22 122/72      Medication History  Medication history was not completed because previously completed (follow-up visit).    Adverse Drug Reactions  Adverse drug reactions were reviewed with the patient.    Significant adverse drug reaction(s) were not identified.    Side effect(s) were not reported.    Home Medications    Medication Sig   blood sugar diagnostic (ONETOUCH ULTRA TEST) test strip Use to check blood sugars daily. E11.65   blood-glucose meter (ACCU-CHEK GUIDE GLUCOSE METER) kit Use one strip as directed daily. Diagnosis Code: DM-2   carvediloL (COREG) 6.25 mg tablet Take two tablets by mouth twice daily with meals. Take with food.   cholecalciferol(+) (VITAMIN D3) 2,000 unit tablet Take one tablet by mouth daily. cyanocobalamin (vitamin B-12) 3,000 mcg cap Take 1 capsule  by mouth daily.   fish oil /omega-3 fatty acids (SEA-OMEGA) 340/1000 mg capsule Take one capsule by mouth daily.   furosemide (LASIX) 20 mg tablet Take one tablet by mouth daily as needed.   lancets 33 gauge 33 gauge Use to check blood sugars daily E11.65   Leg Brace misc Bilateral Fitted leg brace and right shoe for foot drop.  Dx: spina bifida.   levETIRAcetam (KEPPRA) 500 mg tablet Take one tablet by mouth twice daily.   losartan (COZAAR) 100 mg tablet TAKE 1 TABLET BY MOUTH ONCE DAILY   metFORMIN-XR (GLUCOPHAGE XR) 500 mg extended release tablet Take one tablet by mouth twice daily with meals.   Miscellaneous Medical Supply misc Shoe lift for right foot.  Dx: spina bifida   multivit-min/folic/vit K/lycop (ONE-A-DAY MEN'S MULTIVITAMIN PO) Take 1 tablet by mouth daily.   Ostomy Supplies 1  misc [employer] 1 piece Urostomy bag.  Use 1 bag as needed.   psyllium seed (with sugar) (FIBER PO) Take 2 tablets by mouth daily.   rosuvastatin (CRESTOR) 40 mg tablet Take one-half tablet by mouth daily.  Patient taking differently: Take one tablet by mouth daily.   spironolactone (ALDACTONE) 25 mg tablet take 2 tablets BY MOUTH ONCE daily WITH FOOD   tirzepatide (MOUNJARO) 5 mg/0.5 mL injector EN Inject 0.5 mL under the skin every 7 days.   triamcinolone acetonide (TRIDERM) 0.1 % topical cream Apply  topically to affected area twice daily.  Patient taking differently: Apply  topically to affected area as Needed.      Education  Education provided: not necessary    Time spent with patient: 15 minutes    Reece Leader, [facility]

## 2022-01-21 ENCOUNTER — Encounter: Admit: 2022-01-21 | Discharge: 2022-01-21 | Payer: MEDICARE

## 2022-01-22 ENCOUNTER — Encounter: Admit: 2022-01-22 | Discharge: 2022-01-22 | Payer: MEDICARE

## 2022-01-22 MED ORDER — ICOSAPENT ETHYL 1 GRAM PO CAP
2 g | ORAL_CAPSULE | Freq: Two times a day (BID) | ORAL | 3 refills | Status: AC
Start: 2022-01-22 — End: ?
  Filled 2022-01-26: qty 50, 50d supply, fill #2

## 2022-01-22 MED ORDER — EZETIMIBE 10 MG PO TAB
10 mg | ORAL_TABLET | Freq: Every day | ORAL | 3 refills | Status: AC
Start: 2022-01-22 — End: ?

## 2022-01-22 MED ORDER — ROSUVASTATIN 40 MG PO TAB
40 mg | ORAL_TABLET | Freq: Every evening | ORAL | 3 refills | 90.00000 days | Status: AC
Start: 2022-01-22 — End: ?

## 2022-01-22 NOTE — Telephone Encounter
1/12 I spoke with Allen Gill. I sent the Zetia and a new prescription for Rosuvastatin to reflect his 40mg  daily dose. The Vascepa was sent to [facility] for insurance coverage reasons. Pt informed and he verbalized understanding. [name], RN    From: Jefferey Pica, DO  Sent: 01/22/2022  12:55 PM CST  To: Horton Finer, RN; *    Thanks for the clarification.  Can we update in the chart because it is still listed as Crestor 40 mg, take one half tab.  Lets have him start Zetia 10 mg daily then.  And we can still see if [name] will be covered by his insurance.  Thanks.    TMB  ----- Message -----  From: Horton Finer, RN  Sent: 01/22/2022  12:10 PM CST  To: Jefferey Pica, DO; *    ----- Message from Horton Finer, RN sent at 01/22/2022 12:10 PM CST -----  Dr. Ballard Russell,    Rosuvastatin was increased to 40mg  by Dr. Wesley Blas at [facility] end of October. Patient has been taking 40mg  since then. Please review/advise.    Ander Purpura

## 2022-01-26 ENCOUNTER — Encounter: Admit: 2022-01-26 | Discharge: 2022-01-26 | Payer: MEDICARE

## 2022-02-02 ENCOUNTER — Encounter: Admit: 2022-02-02 | Discharge: 2022-02-02 | Payer: MEDICARE

## 2022-02-02 ENCOUNTER — Ambulatory Visit: Admit: 2022-02-02 | Discharge: 2022-02-02 | Payer: MEDICARE

## 2022-02-02 DIAGNOSIS — N1330 Unspecified hydronephrosis: Secondary | ICD-10-CM

## 2022-02-02 DIAGNOSIS — Z936 Other artificial openings of urinary tract status: Secondary | ICD-10-CM

## 2022-02-02 DIAGNOSIS — N200 Calculus of kidney: Secondary | ICD-10-CM

## 2022-02-02 LAB — BASIC METABOLIC PANEL
ANION GAP: 10 (ref 3–12)
BLD UREA NITROGEN: 30 mg/dL — ABNORMAL HIGH (ref 7–25)
CALCIUM: 9.4 mg/dL (ref 8.5–10.6)
CHLORIDE: 103 MMOL/L (ref 98–110)
CO2: 27 MMOL/L (ref 21–30)
CREATININE: 1 mg/dL (ref 0.4–1.24)
EGFR: >60 mL/min (ref >60)
GLUCOSE,PANEL: 123 mg/dL — ABNORMAL HIGH (ref 70–100)
POTASSIUM: 4.6 MMOL/L (ref 3.5–5.1)
SODIUM: 140 MMOL/L (ref 137–147)

## 2022-02-02 NOTE — Progress Notes
General Risk Score 5  HCC Score 2.711  Program criteria met -   Problem List (Diagnosis) DM    Spoke briefly with patient. He is currently at an appointment. Will call back at a later time.     Treatment Goals and Expected Outcomes (patient centered)     1. Good blood sugar control   2. Increase activity as tolerated for strength, balance and mobility  3. Stay as healthy as possible      Barriers to Care:     1. Knowledge deficit  2. Chronic conditions     Care Plan:     1. Continue to follow plan of care and specialty provider appointments  2. Continue work on [employer], good job on cutting down soda. Lean protein, vegetables and fruit - low carb  3. Continue to increase activity as tolerated for strength, balance and mobility  4. Instructed patient to call care manager any time with questions or concerns.   5. Routing note to PCP    LOV:    10/28/21    Upcoming appointments include:    Future Appointments   Date Time Provider [place]   02/02/2022  3:00 PM Wyre, Lennox Laity, MD [facility] [place] Exam   02/17/2022 10:30 AM Marrianne Mood, [id number]   04/28/2022  1:30 PM Joneen Caraway, MD [place]       Next review Guadalupe Dawn of plan (approximate):    4-6 weeks

## 2022-02-04 ENCOUNTER — Encounter: Admit: 2022-02-04 | Discharge: 2022-02-04 | Payer: MEDICARE

## 2022-02-04 DIAGNOSIS — I428 Other cardiomyopathies: Secondary | ICD-10-CM

## 2022-02-04 DIAGNOSIS — I10 Essential (primary) hypertension: Secondary | ICD-10-CM

## 2022-02-04 MED ORDER — SPIRONOLACTONE 25 MG PO TAB
50 mg | ORAL_TABLET | Freq: Every day | ORAL | 1 refills | 90.00000 days | Status: AC
Start: 2022-02-04 — End: ?

## 2022-02-04 MED ORDER — CARVEDILOL 6.25 MG PO TAB
12.5 mg | ORAL_TABLET | Freq: Two times a day (BID) | ORAL | 3 refills | 90.00000 days | Status: AC
Start: 2022-02-04 — End: ?

## 2022-02-17 ENCOUNTER — Encounter: Admit: 2022-02-17 | Discharge: 2022-02-17 | Payer: MEDICARE

## 2022-02-17 ENCOUNTER — Ambulatory Visit: Admit: 2022-02-17 | Discharge: 2022-02-17 | Payer: MEDICARE

## 2022-02-17 DIAGNOSIS — E1165 Type 2 diabetes mellitus with hyperglycemia: Secondary | ICD-10-CM

## 2022-02-17 MED ORDER — MOUNJARO 10 MG/0.5 ML SC PNIJ
10 mg | SUBCUTANEOUS | 0 refills | Status: AC
Start: 2022-02-17 — End: ?
  Filled 2022-02-18: qty 2, 28d supply, fill #1

## 2022-02-17 NOTE — Progress Notes
A follow-up comprehensive medication management visit was completed today via telephone.    Referral reason: Diabetes Management  Referring provider: Orson Gear, MD    Assessment & Plan     Diabetes  Patient's diabetes is controlled as reflected by home blood sugars. Their goal glucose is pre-prandial 80-130 and post-prandial <180. Their most recent A1C was 8% on 10/28/2021 with a goal of < 7%    Tolerating Mounjaro dose increase, blood sugars not at goal, will increase dose further.    Plan  - INCREASE Mounjaro 10 mg once a week on Tuesdays  - Continue metformin XR 500 mg twice daily      Follow-up  The patient will continue to follow up with the pharmacist. Return to pharmacist in 4 weeks via telephone. The return visit was scheduled during today's visit.    Subjective & Objective   At the last visit, the patient was instructed to increase Mounjaro 7.5 mg once a week. The patient states they made all of the recommended changes.     Tolerating Mounjaro dose increase with no side effects or issues. No nausea, diarrhea, or constipation. Has one more dose for next Tuesdays.   Average fasting 150 mg/dL  Lowest 010 mg/dL   Highest 272 mg/dL   Hasn't been consistently below 130 mg/dL   Feels like Mounjaro isn't working as well as Science writer did.   Appetite is still doing good, no concerns with lack of appetite. Eating 2-3 meals a day. Lunch is usually 12:45 - dinner is around 5-5:30     Diabetes    HPI  Current diabetes medication regimen:   - metformin XR 500 mg twice daily  - Mounjaro 7.5 mg once a week on Tuesdays  Previous medications for diabetes:   - Trulicity - switched to [employer]   - metformin IR - diarrhea   - Januvia - switched to Trulicity     Blood Glucose  Monitoring times: fasting  The baseline HbA1C was 9.4 on 07/07/2020.    Diet and Exercise  Number of meals per day: 2  Breakfast: doesn't usually eat breakfast  Lunch: chicken salad  Dinner: mac and cheese, chicken salad, veggies, usually fairly carb heavy   Snacks: sometimes will snack throughout the day - popcorn, cookies, rice crispy, potato chips   Drinks: if not going out and wants something with his meal then will drink soda, but when he goes out will have water  Last meal of the day is usually 5 pm     Comorbidities  ASCVD condition(s): none  CKD: no  Heart failure: no    Obesity: yes    Health Maintenance  Aspirin utilization: Patient is not taking aspirin due to the following reason(s): not indicated.  Statin utilization: Patient is taking a statin.  Hypertension: Patient has hypertension, which is not controlled.    Primary Care - Labs   Basic Metabolic Profile    Lab Results   Component Value Date/Time    NA 140 02/02/2022 04:12 PM    K 4.6 02/02/2022 04:12 PM    CA 9.4 02/02/2022 04:12 PM    CL 103 02/02/2022 04:12 PM    CO2 27 02/02/2022 04:12 PM    GAP 10 02/02/2022 04:12 PM    EGFR1 >60 02/02/2022 04:12 PM    Lab Results   Component Value Date/Time    BUN 30 (H) 02/02/2022 04:12 PM    CR 1.05 02/02/2022 04:12 PM    GLU 123 (H) 02/02/2022 04:12  PM    GLU 113 (H) 10/29/2020 02:53 PM         Lab Draw:  Lab Results   Component Value Date/Time    HGBA1C 8.0 (H) 10/28/2021 02:42 PM    HGBA1C 8.6 (H) 04/29/2021 12:32 PM    HGBA1C 9.5 (H) 10/29/2020 02:53 PM     POC:  No results found for: A1C     No results found for: MCALB24   Microalbumin/CR ratio Urine   Date Value Ref Range Status   10/28/2021 54.41 (H) <30 ug/mg Final     Comment:     NOTE NEW REFERENCE RANGES     Microalbumin, Random   Date Value Ref Range Status   10/28/2021 50.6 MCG/ML Final        Lab Results   Component Value Date    CHOL 153 01/20/2022    TRIG 229 (H) 01/20/2022    HDL 33 (L) 01/20/2022    LDL 100 (H) 01/20/2022    VLDL 46 01/20/2022    NONHDLCHOL 120 01/20/2022    CHOLHDLC 6.9 (H) 10/29/2020         BP Readings from Last 1 Encounters:   02/02/22 112/57      Medication History  Medication history was not completed because previously completed (follow-up visit).    Adverse Drug Reactions  Adverse drug reactions were reviewed with the patient.    Significant adverse drug reaction(s) were not identified.    Side effect(s) were not reported.    Home Medications    Medication Sig   blood sugar diagnostic (ONETOUCH ULTRA TEST) test strip Use to check blood sugars daily. E11.65   blood-glucose meter (ACCU-CHEK GUIDE GLUCOSE METER) kit Use one strip as directed daily. Diagnosis Code: DM-2   carvediloL (COREG) 6.25 mg tablet take 2 tablets BY MOUTH TWICE DAILY WITH MEALS   cholecalciferol(+) (VITAMIN D3) 2,000 unit tablet Take one tablet by mouth daily.   cyanocobalamin (vitamin B-12) 3,000 mcg cap Take 1 capsule by mouth daily.   ezetimibe (ZETIA) 10 mg tablet Take one tablet by mouth daily.   furosemide (LASIX) 20 mg tablet Take one tablet by mouth daily as needed.   icosapent ethyL (VASCEPA) 1 gram capsule Take two capsules by mouth twice daily with meals.   lancets 33 gauge 33 gauge Use to check blood sugars daily E11.65   Leg Brace misc Bilateral Fitted leg brace and right shoe for foot drop.  Dx: spina bifida.   levETIRAcetam (KEPPRA) 500 mg tablet Take one tablet by mouth twice daily.   losartan (COZAAR) 100 mg tablet TAKE 1 TABLET BY MOUTH ONCE DAILY   metFORMIN-XR (GLUCOPHAGE XR) 500 mg extended release tablet Take one tablet by mouth twice daily with meals.   Miscellaneous Medical Supply misc Shoe lift for right foot.  Dx: spina bifida   multivit-min/folic/vit K/lycop (ONE-A-DAY MEN'S MULTIVITAMIN PO) Take 1 tablet by mouth daily.   Ostomy Supplies 1  misc [employer] 1 piece Urostomy bag.  Use 1 bag as needed.   psyllium seed (with sugar) (FIBER PO) Take 2 tablets by mouth daily.   rosuvastatin (CRESTOR) 40 mg tablet Take one tablet by mouth at bedtime daily.   spironolactone (ALDACTONE) 25 mg tablet take 2 tablets BY MOUTH ONCE daily WITH FOOD   tirzepatide (MOUNJARO) 7.5 mg/0.5 mL injector PEN Inject 0.5 mL under the skin every 7 days.   triamcinolone acetonide (TRIDERM) 0.1 % topical cream Apply  topically to affected area twice daily.  Patient taking  differently: Apply  topically to affected area as Needed.      Education  Education provided: not necessary    Time spent with patient: 10 minutes    Reece Leader, [facility]

## 2022-02-18 ENCOUNTER — Encounter: Admit: 2022-02-18 | Discharge: 2022-02-18 | Payer: MEDICARE

## 2022-02-26 ENCOUNTER — Encounter: Admit: 2022-02-26 | Discharge: 2022-02-26 | Payer: MEDICARE

## 2022-03-01 ENCOUNTER — Encounter: Admit: 2022-03-01 | Discharge: 2022-03-01 | Payer: MEDICARE

## 2022-03-01 ENCOUNTER — Ambulatory Visit: Admit: 2022-03-01 | Discharge: 2022-03-01 | Payer: MEDICARE

## 2022-03-01 DIAGNOSIS — N1330 Unspecified hydronephrosis: Secondary | ICD-10-CM

## 2022-03-01 MED ORDER — RP DX TC-99M MERTIATIDE MCI
5 | Freq: Once | INTRAVENOUS | 0 refills | Status: CP
Start: 2022-03-01 — End: ?
  Administered 2022-03-01: 17:00:00 5 via INTRAVENOUS

## 2022-03-01 MED ORDER — FUROSEMIDE 10 MG/ML IJ SOLN
40 mg | Freq: Once | INTRAVENOUS | 0 refills | Status: CP
Start: 2022-03-01 — End: ?
  Administered 2022-03-01: 17:00:00 40 mg via INTRAVENOUS

## 2022-03-02 ENCOUNTER — Encounter: Admit: 2022-03-02 | Discharge: 2022-03-02 | Payer: MEDICARE

## 2022-03-02 NOTE — Telephone Encounter
I called pt to discuss results of NM renal scan.  I was unable to get in touch with the pt and left a VM.

## 2022-03-03 ENCOUNTER — Encounter: Admit: 2022-03-03 | Discharge: 2022-03-03 | Payer: MEDICARE

## 2022-03-03 NOTE — Telephone Encounter
The patient returned a call to Dr. Randa Lynn to discuss his results from his recent imaging. I advised that I would reach out to Dr. Randa Lynn and have him try to call again as soon as he is able.

## 2022-03-14 ENCOUNTER — Encounter: Admit: 2022-03-14 | Discharge: 2022-03-14 | Payer: MEDICARE

## 2022-03-16 ENCOUNTER — Encounter: Admit: 2022-03-16 | Discharge: 2022-03-16 | Payer: MEDICARE

## 2022-03-16 ENCOUNTER — Inpatient Hospital Stay: Admit: 2022-03-16 | Discharge: 2022-03-16 | Payer: MEDICARE

## 2022-03-16 DIAGNOSIS — N135 Crossing vessel and stricture of ureter without hydronephrosis: Secondary | ICD-10-CM

## 2022-03-16 MED ORDER — LEVOFLOXACIN IN D5W 500 MG/100 ML IV PGBK
500 mg | Freq: Once | INTRAVENOUS | 0 refills
Start: 2022-03-16 — End: ?

## 2022-03-16 NOTE — Telephone Encounter
LVM for patient to call back to confirm a surgery date of 04/10 with Dr. Randa Lynn. I left my direct number for call back.

## 2022-03-16 NOTE — Telephone Encounter
I called pt to discuss results of his NM renal scan.  I was unable to reach him.  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  Recommend proceeding with right ureteral reimplant.  Will work to get this scheduled.

## 2022-03-17 ENCOUNTER — Encounter: Admit: 2022-03-17 | Discharge: 2022-03-17 | Payer: MEDICARE

## 2022-03-17 ENCOUNTER — Ambulatory Visit: Admit: 2022-03-17 | Discharge: 2022-03-17 | Payer: MEDICARE

## 2022-03-17 DIAGNOSIS — E1165 Type 2 diabetes mellitus with hyperglycemia: Secondary | ICD-10-CM

## 2022-03-17 MED ORDER — MOUNJARO 12.5 MG/0.5 ML SC PNIJ
12.5 mg | SUBCUTANEOUS | 0 refills | Status: AC
Start: 2022-03-17 — End: ?

## 2022-03-17 NOTE — Progress Notes
A follow-up comprehensive medication management visit was completed today via telephone.    Referral reason: Diabetes Management  Referring provider: Orson Gear, MD    Assessment & Plan     Diabetes  Patient's diabetes is uncontrolled as reflected by home blood sugars and A1C. Their goal glucose is pre-prandial 80-130 and post-prandial <180. Their most recent A1C was 8% on 10/28/2021 with a goal of < 7%    Tolerating Mounjaro dose increase, blood sugars improved but not consistently at goal, average fasting blood sugars slightly high. Will increase dose for additional blood sugar lowering and weight loss benefit. Encouraged patient to continue with dietary changes and exercise.     Discussed ongoing shortage with Mounjaro. Patient to notify pharmacist if unable to fill next prescription.       Plan  - INCREASE Mounjaro 12.5 mg once a week on Tuesdays  - Continue metformin XR 500 mg twice daily      Follow-up  The patient will continue to follow up with the pharmacist. Return to pharmacist in 4 weeks via telephone. The return visit was scheduled during today's visit.    Subjective & Objective   At the last visit, the patient was instructed to increase Mounjaro 10 mg once a week. The patient states they made all of the recommended changes.     Reported he's been doing well on Mounjaro 10 mg dose. No side effects, no nausea, GI upset, constipation.   Reported his appetite is doing okay, able to eat. Working on dietary changes.   He started working out with his brother 3x/week for about 1.5 hour. Doing stretching and strength training.     Reported his blood sugars have average 140s. About half of the fasting readings from the past week have been >150 and the other half <130.   Highest 168 mg/dL   Lowest 454 mg/dL   Fasting over the past 3 days - 124, 114, 126 mg/dL     Last time he checked his weight, he was 270 lb. Wants to get down to 250 lb.     Eating 2-3 meals a day. Lunch is usually 12:45 - dinner is around 5-5:30     Diabetes    HPI  Current diabetes medication regimen:   - metformin XR 500 mg twice daily  - Mounjaro 10 mg once a week on Tuesdays - has one dose left for next Tuesday  Previous medications for diabetes:   - Trulicity - switched to [employer]   - metformin IR - diarrhea   - Januvia - switched to Trulicity     Blood Glucose  Monitoring times: fasting  The baseline HbA1C was 9.4 on 07/07/2020.    Patient reported readings:  Average 140   Highest 168 mg/dL   Lowest 098 mg/dL   Fasting over the past 3 days - 124, 114, 126 mg/dL     Diet and Exercise  Number of meals per day: 2  Breakfast: doesn't usually eat breakfast  Lunch: chicken salad  Dinner: mac and cheese, chicken salad, veggies, usually fairly carb heavy   Snacks: sometimes will snack throughout the day - popcorn, cookies, rice crispy, potato chips   Drinks: if not going out and wants something with his meal then will drink soda, but when he goes out will have water  Last meal of the day is usually 5 pm     Comorbidities  ASCVD condition(s): none  CKD: no  Heart failure: no    Obesity: yes  Health Maintenance  Aspirin utilization: Patient is not taking aspirin due to the following reason(s): not indicated.  Statin utilization: Patient is taking a statin.  Hypertension: Patient has hypertension, which is not controlled.    Primary Care - Labs   Basic Metabolic Profile    Lab Results   Component Value Date/Time    NA 140 02/02/2022 04:12 PM    K 4.6 02/02/2022 04:12 PM    CA 9.4 02/02/2022 04:12 PM    CL 103 02/02/2022 04:12 PM    CO2 27 02/02/2022 04:12 PM    GAP 10 02/02/2022 04:12 PM    EGFR1 >60 02/02/2022 04:12 PM    Lab Results   Component Value Date/Time    BUN 30 (H) 02/02/2022 04:12 PM    CR 1.05 02/02/2022 04:12 PM    GLU 123 (H) 02/02/2022 04:12 PM    GLU 113 (H) 10/29/2020 02:53 PM         Lab Draw:  Lab Results   Component Value Date/Time    HGBA1C 8.0 (H) 10/28/2021 02:42 PM    HGBA1C 8.6 (H) 04/29/2021 12:32 PM    HGBA1C 9.5 (H) 10/29/2020 02:53 PM     POC:  No results found for: A1C     No results found for: MCALB24   Microalbumin/CR ratio Urine   Date Value Ref Range Status   10/28/2021 54.41 (H) <30 ug/mg Final     Comment:     NOTE NEW REFERENCE RANGES     Microalbumin, Random   Date Value Ref Range Status   10/28/2021 50.6 MCG/ML Final        Lab Results   Component Value Date    CHOL 153 01/20/2022    TRIG 229 (H) 01/20/2022    HDL 33 (L) 01/20/2022    LDL 100 (H) 01/20/2022    VLDL 46 01/20/2022    NONHDLCHOL 120 01/20/2022    CHOLHDLC 6.9 (H) 10/29/2020         BP Readings from Last 1 Encounters:   02/02/22 112/57      Medication History  Medication history was completed and the patient's medication list was updated to reflect what they are currently taking.    Adverse Drug Reactions  Adverse drug reactions were reviewed with the patient.    Significant adverse drug reaction(s) were not identified.    Side effect(s) were not reported.    The patient is filling their medications through 2 pharmacies due to the following reason(s): patient preference. Patient is currently using [facility] pharmacy. Patient is currently using [facility] based on patient preference.    Home Medications    Medication Sig   blood sugar diagnostic (ONETOUCH ULTRA TEST) test strip Use to check blood sugars daily. E11.65   blood-glucose meter (ACCU-CHEK GUIDE GLUCOSE METER) kit Use one strip as directed daily. Diagnosis Code: DM-2   carvediloL (COREG) 6.25 mg tablet take 2 tablets BY MOUTH TWICE DAILY WITH MEALS   cholecalciferol(+) (VITAMIN D3) 2,000 unit tablet Take one tablet by mouth daily.   cyanocobalamin (vitamin B-12) 3,000 mcg cap Take 1 capsule by mouth daily.   ezetimibe (ZETIA) 10 mg tablet Take one tablet by mouth daily.   furosemide (LASIX) 20 mg tablet Take one tablet by mouth daily as needed.   icosapent ethyL (VASCEPA) 1 gram capsule Take two capsules by mouth twice daily with meals.   lancets 33 gauge 33 gauge Use to check blood sugars daily E11.65  Leg Brace misc Bilateral Fitted leg brace and right shoe for foot drop.  Dx: spina bifida.   levETIRAcetam (KEPPRA) 500 mg tablet Take one tablet by mouth twice daily.   losartan (COZAAR) 100 mg tablet TAKE 1 TABLET BY MOUTH ONCE DAILY   metFORMIN-XR (GLUCOPHAGE XR) 500 mg extended release tablet Take one tablet by mouth twice daily with meals.   Miscellaneous Medical Supply misc Shoe lift for right foot.  Dx: spina bifida   multivit-min/folic/vit K/lycop (ONE-A-DAY MEN'S MULTIVITAMIN PO) Take 1 tablet by mouth daily.   Ostomy Supplies 1  misc [employer] 1 piece Urostomy bag.  Use 1 bag as needed.   psyllium seed (with sugar) (FIBER PO) Take 2 tablets by mouth daily.   rosuvastatin (CRESTOR) 40 mg tablet Take one tablet by mouth at bedtime daily.   spironolactone (ALDACTONE) 25 mg tablet take 2 tablets BY MOUTH ONCE daily WITH FOOD   tirzepatide (MOUNJARO) 10 mg/0.5 mL injector PEN Inject 0.5 mL under the skin every 7 days.   triamcinolone acetonide (TRIDERM) 0.1 % topical cream Apply  topically to affected area twice daily.  Patient taking differently: Apply  topically to affected area as Needed.      Education  Education provided: not necessary    Time spent with patient: 15 minutes    Reece Leader, [facility]

## 2022-03-18 ENCOUNTER — Encounter: Admit: 2022-03-18 | Discharge: 2022-03-18 | Payer: MEDICARE

## 2022-03-18 MED FILL — ONETOUCH ULTRA TEST MISC STRP: 50 days supply | Qty: 50 | Fill #3 | Status: CP

## 2022-03-18 MED FILL — LANCETS 33 GAUGE MISC MISC: 33 gauge | 90 days supply | Qty: 100 | Fill #2 | Status: CP

## 2022-03-19 ENCOUNTER — Encounter: Admit: 2022-03-19 | Discharge: 2022-03-19 | Payer: MEDICARE

## 2022-03-22 ENCOUNTER — Encounter: Admit: 2022-03-22 | Discharge: 2022-03-22 | Payer: MEDICARE

## 2022-03-23 ENCOUNTER — Encounter: Admit: 2022-03-23 | Discharge: 2022-03-23 | Payer: MEDICARE

## 2022-03-24 ENCOUNTER — Encounter: Admit: 2022-03-24 | Discharge: 2022-03-24 | Payer: MEDICARE

## 2022-03-24 NOTE — Telephone Encounter
PA for Vascepa submitted through Cover My Meds Key: [id number]

## 2022-03-25 ENCOUNTER — Encounter: Admit: 2022-03-25 | Discharge: 2022-03-25 | Payer: MEDICARE

## 2022-03-25 NOTE — Progress Notes
Pharmacy Benefits Investigation    Medication name: OZEMPIC 2 MG/DOSE (8 MG/3 ML) SC PNIJ    The insurance requires a prior authorization for the medication.    The out of pocket cost is $0.    Janeece Riggers  Specialty Pharmacy Patient Advocate

## 2022-03-26 ENCOUNTER — Encounter: Admit: 2022-03-26 | Discharge: 2022-03-26 | Payer: MEDICARE

## 2022-03-26 NOTE — Telephone Encounter
PA for Zetia submitted through Cover My Meds Key: [id number]

## 2022-03-29 ENCOUNTER — Encounter: Admit: 2022-03-29 | Discharge: 2022-03-29 | Payer: MEDICARE

## 2022-03-29 MED FILL — OZEMPIC 2 MG/DOSE (8 MG/3 ML) SC PNIJ: 2 mg/dose (8 mg/3 mL) | SUBCUTANEOUS | 28 days supply | Qty: 3 | Fill #1 | Status: AC

## 2022-03-31 ENCOUNTER — Encounter: Admit: 2022-03-31 | Discharge: 2022-03-31 | Payer: MEDICARE

## 2022-03-31 ENCOUNTER — Ambulatory Visit: Admit: 2022-03-31 | Discharge: 2022-04-01 | Payer: MEDICARE

## 2022-03-31 NOTE — Progress Notes
A follow-up comprehensive medication management visit was completed today via telehealth.    Referral reason: Diabetes Management  Referring provider: Orson Gear, MD    Assessment & Plan     Diabetes  Patient's diabetes is uncontrolled as reflected by home blood sugars and A1C. Their goal glucose is pre-prandial 80-130 and post-prandial <180. Their most recent A1C was 8% on 10/28/2021 with a goal of < 7%    Patient was able to successfully self-administer ozempic 2 mg dose during telehealth appt. Discussed potential side effects with patient, anticipate he would do well with it since he had tolerate Mounjaro and Trulicity well. Discussed with patient he can keep ozempic at room temperature so does not need to transport in a cooler unless the temperature where he will be staying is going to be >64F. Discussed with patient can hold dose the week of his surgery and take when he gets home up to 3 days late otherwise skip missed dose and resume the following dose.     Ultimately, will try to get patient back on Mounjaro once it is available.       Plan  - START Ozempic 2 mg once a week on Tuesdays   - Continue metformin XR 500 mg twice daily    Follow-up  The patient will continue to follow up with the pharmacist. Return to pharmacist in 2 weeks via telephone. The return visit was scheduled during today's visit.    Subjective & Objective   At the last visit, the patient was instructed to switch to ozempic 2 mg due. The patient states they made all of the recommended changes.     Patient was switched to ozempic 2 mg as he has not been able to get [facility] from the pharmacy.   Patient confirmed he was able to get Ozempic and had it with him during appt for injection teaching.   He usually takes his Mounjaro on Tuesdays so he plans on switching back to Tuesday after this week.   Reported he has a procedure coming up on 4/17 so will likely have to skip a dose that week.   Also will be going to [place] from 4/5-4/10 so is worried about how to travel with it for the car ride there.    Was doing well on Mounjaro 10 mg but not able to get from pharmacy due to shortage.  Reported his appetite is doing okay, able to eat. Working on dietary changes.   He started working out with his brother 3x/week for about 1.5 hour. Doing stretching and strength training.   Last time he checked his weight, he was 270 lb. Wants to get down to 250 lb.   Eating 2-3 meals a day. Lunch is usually 12:45 - dinner is around 5-5:30     Diabetes    HPI  Current diabetes medication regimen:   - metformin XR 500 mg twice daily  - Ozempic 2 mg once a week - not yet started    Previous medications for diabetes:   - Mounjaro - switched to Ozempic due to shortage  - Trulicity - switched to [employer]   - metformin IR - diarrhea   - Januvia - switched to Trulicity     Blood Glucose  Monitoring times: fasting  The baseline HbA1C was 9.4 on 07/07/2020.    Patient reported readings:  Average 140   Highest 168 mg/dL   Lowest 161 mg/dL   Fasting over the past 3 days - 124, 114, 126  mg/dL     Diet and Exercise  Number of meals per day: 2  Breakfast: doesn't usually eat breakfast  Lunch: chicken salad  Dinner: mac and cheese, chicken salad, veggies, usually fairly carb heavy   Snacks: sometimes will snack throughout the day - popcorn, cookies, rice crispy, potato chips   Drinks: if not going out and wants something with his meal then will drink soda, but when he goes out will have water  Last meal of the day is usually 5 pm     Comorbidities  ASCVD condition(s): none  CKD: no  Heart failure: no    Obesity: yes    Health Maintenance  Aspirin utilization: Patient is not taking aspirin due to the following reason(s): not indicated.  Statin utilization: Patient is taking a statin.  Hypertension: Patient has hypertension, which is not controlled.    Primary Care - Labs   Basic Metabolic Profile    Lab Results   Component Value Date/Time    NA 140 02/02/2022 04:12 PM    K 4.6 02/02/2022 04:12 PM    CA 9.4 02/02/2022 04:12 PM    CL 103 02/02/2022 04:12 PM    CO2 27 02/02/2022 04:12 PM    GAP 10 02/02/2022 04:12 PM    EGFR1 >60 02/02/2022 04:12 PM    Lab Results   Component Value Date/Time    BUN 30 (H) 02/02/2022 04:12 PM    CR 1.05 02/02/2022 04:12 PM    GLU 123 (H) 02/02/2022 04:12 PM    GLU 113 (H) 10/29/2020 02:53 PM         Lab Draw:  Lab Results   Component Value Date/Time    HGBA1C 8.0 (H) 10/28/2021 02:42 PM    HGBA1C 8.6 (H) 04/29/2021 12:32 PM    HGBA1C 9.5 (H) 10/29/2020 02:53 PM     POC:  No results found for: A1C     No results found for: MCALB24   Microalbumin/CR ratio Urine   Date Value Ref Range Status   10/28/2021 54.41 (H) <30 ug/mg Final     Comment:     NOTE NEW REFERENCE RANGES     Microalbumin, Random   Date Value Ref Range Status   10/28/2021 50.6 MCG/ML Final        Lab Results   Component Value Date    CHOL 153 01/20/2022    TRIG 229 (H) 01/20/2022    HDL 33 (L) 01/20/2022    LDL 100 (H) 01/20/2022    VLDL 46 01/20/2022    NONHDLCHOL 120 01/20/2022    CHOLHDLC 6.9 (H) 10/29/2020         BP Readings from Last 1 Encounters:   02/02/22 112/57      Medication History  Medication history was completed and the patient's medication list was updated to reflect what they are currently taking.    Adverse Drug Reactions  Adverse drug reactions were reviewed with the patient.    Significant adverse drug reaction(s) were not identified.    Side effect(s) were not reported.    The patient is filling their medications through 2 pharmacies due to the following reason(s): patient preference. Patient is currently using [facility] pharmacy. Patient is currently using [facility] based on patient preference.    Home Medications    Medication Sig   blood sugar diagnostic (ONETOUCH ULTRA TEST) test strip Use to check blood sugars daily. E11.65   blood-glucose meter (ACCU-CHEK GUIDE GLUCOSE METER) kit Use  one strip as directed daily. Diagnosis Code: DM-2 carvediloL (COREG) 6.25 mg tablet take 2 tablets BY MOUTH TWICE DAILY WITH MEALS   cholecalciferol(+) (VITAMIN D3) 2,000 unit tablet Take one tablet by mouth daily.   cyanocobalamin (vitamin B-12) 3,000 mcg cap Take 1 capsule by mouth daily.   ezetimibe (ZETIA) 10 mg tablet Take one tablet by mouth daily.   furosemide (LASIX) 20 mg tablet Take one tablet by mouth daily as needed.   lancets 33 gauge 33 gauge Use to check blood sugars daily E11.65   Leg Brace misc Bilateral Fitted leg brace and right shoe for foot drop.  Dx: spina bifida.   levETIRAcetam (KEPPRA) 500 mg tablet Take one tablet by mouth twice daily.   losartan (COZAAR) 100 mg tablet TAKE 1 TABLET BY MOUTH ONCE DAILY   metFORMIN-XR (GLUCOPHAGE XR) 500 mg extended release tablet Take one tablet by mouth twice daily with meals.   Miscellaneous Medical Supply misc Shoe lift for right foot.  Dx: spina bifida   multivit-min/folic/vit K/lycop (ONE-A-DAY MEN'S MULTIVITAMIN PO) Take 1 tablet by mouth daily.   Ostomy Supplies 1  misc [employer] 1 piece Urostomy bag.  Use 1 bag as needed.   psyllium seed (with sugar) (FIBER PO) Take 2 tablets by mouth daily.   rosuvastatin (CRESTOR) 40 mg tablet Take one tablet by mouth at bedtime daily.   semaglutide (OZEMPIC) 2 mg/dose (8 mg/3 mL) injection PEN Inject two mg under the skin every 7 days.   spironolactone (ALDACTONE) 25 mg tablet take 2 tablets BY MOUTH ONCE daily WITH FOOD   triamcinolone acetonide (TRIDERM) 0.1 % topical cream Apply  topically to affected area twice daily.  Patient taking differently: Apply  topically to affected area as Needed.      Education  Education provided: yes    GLP-1 Agonist Teaching - the following topics were discussed:     Overview:  - Storage of medication  - Expiration of medication  - Review of pen (different components)  - What medication should look like  - How often to administer  - Side effects  - Importance of checking blood sugar, if applicable     Supplies:  - Clean injection site with alcohol swab before each injection  - Use a new needle with each injection (Ozempic, Victoza), use a new pen if autoinjector (Mounjaro, Trulicity, ZOXWRU)   - Obtain a sharps container or similar for pen needle disposal    Getting Started:  - Perform flow check before first time using a new pen (Ozempic, Saxenda, Victoza)  - Dial to dose prescribed (Ozempic, Saxenda, Victoza)   - Uncap pen ([place], [place], [place])     Injection Technique:  - Injection location (abdomen preferred, thigh or back of arm if needed)  - Avoid tattoos, scars, stretch marks, sores  - Inject at 90 degree angle  - Press and hold injection button (Mounjaro, Trulicity)  - [employer] firmly against skin to inject [facility])  - After inserting needle into skin, push all the way down on button to administer dose (Ozempic, Saxenda, Victoza)   - Listen for clicks to indicate dose has been administered if applicable  - Leave needle inserted for 5-10 seconds to allow for absorption (Ozempic, Saxenda, Victoza) or until audible click indicating needle is retracted (Mounjaro, Trulicity, [place])  - Pull needle straight out of skin (Ozempic, Saxenda, Victoza)    Time spent with patient: 15 minutes    Allen Gill, [facility]

## 2022-04-04 ENCOUNTER — Encounter: Admit: 2022-04-04 | Discharge: 2022-04-04 | Payer: MEDICARE

## 2022-04-07 ENCOUNTER — Encounter: Admit: 2022-04-07 | Discharge: 2022-04-07 | Payer: MEDICARE

## 2022-04-07 ENCOUNTER — Ambulatory Visit: Admit: 2022-04-07 | Discharge: 2022-04-07 | Payer: MEDICARE

## 2022-04-07 DIAGNOSIS — R339 Retention of urine, unspecified: Secondary | ICD-10-CM

## 2022-04-07 DIAGNOSIS — G4733 Obstructive sleep apnea (adult) (pediatric): Secondary | ICD-10-CM

## 2022-04-07 DIAGNOSIS — R569 Unspecified convulsions: Secondary | ICD-10-CM

## 2022-04-07 DIAGNOSIS — I10 Essential (primary) hypertension: Secondary | ICD-10-CM

## 2022-04-07 DIAGNOSIS — N319 Neuromuscular dysfunction of bladder, unspecified: Secondary | ICD-10-CM

## 2022-04-07 DIAGNOSIS — Q0700 Arnold-Chiari syndrome without spina bifida or hydrocephalus: Secondary | ICD-10-CM

## 2022-04-07 DIAGNOSIS — T85618A Breakdown (mechanical) of other specified internal prosthetic devices, implants and grafts, initial encounter: Secondary | ICD-10-CM

## 2022-04-07 DIAGNOSIS — N200 Calculus of kidney: Secondary | ICD-10-CM

## 2022-04-07 DIAGNOSIS — Q059 Spina bifida, unspecified: Secondary | ICD-10-CM

## 2022-04-07 DIAGNOSIS — R51 Headache: Secondary | ICD-10-CM

## 2022-04-07 DIAGNOSIS — Z01810 Encounter for preprocedural cardiovascular examination: Secondary | ICD-10-CM

## 2022-04-07 DIAGNOSIS — N390 Urinary tract infection, site not specified: Secondary | ICD-10-CM

## 2022-04-07 DIAGNOSIS — R000 Tachycardia, unspecified: Secondary | ICD-10-CM

## 2022-04-07 DIAGNOSIS — F988 Other specified behavioral and emotional disorders with onset usually occurring in childhood and adolescence: Secondary | ICD-10-CM

## 2022-04-07 DIAGNOSIS — G919 Hydrocephalus, unspecified: Secondary | ICD-10-CM

## 2022-04-07 DIAGNOSIS — I429 Cardiomyopathy, unspecified: Secondary | ICD-10-CM

## 2022-04-07 NOTE — Pre-Anesthesia Patient Instructions
PREPROCEDURE INFORMATION    Arrival at the hospital  [facility]  [address]  [place], [place] [address]    Park in the [employer], located directly across from the main entrance to the hospital.  Enter through the ground floor main hospital entrance and check in at the Information Desk in the lobby.  They will validate your parking ticket and direct you to the next location.  If you are a woman between the ages of 41 and 70, and have not had a hysterectomy, you will be asked for a urine sample prior to surgery.  Please do not urinate before arriving in the Surgery Waiting Room.  Once there, check in and let the attendant know if you need to provide a sample.    You will receive a call with your surgery arrival time between 2:30pm and 4:30pm the last business day before your procedure.  If you do not receive a call, please call [phone] before 4:30pm or [phone] after 4:30pm.    Eating or drinking before surgery  Nothing to eat after 11:00pm the night before your surgery including gum, mints and candy.  You may have clear liquids up to 2 hours before your surgery time.  Clear Liquid Examples include:  Water, Clear juice (apple or cranberry (no pulp or orange juice) - If diabetic blood sugar must be <200, Coffee and tea with or without sugar (no cream), Sports drink - Powerade/Gatorade, Soda, and Bowel Prep solutions only if ordered by surgeon.  If you have received specific instructions from your surgeon, please follow those.    Planning transportation for outpatient procedure  For your safety, you will need to arrange for a responsible ride/person to accompany you home due to sedation or anesthesia with your procedure.  An Benedetto Goad, taxi or other public transportation driver is not considered a responsible person to accompany you home.    Bath/Shower Instructions  Take a bath or shower using the special soap given to you in PAC. Use half the bottle the night before, and the other half the morning of your procedure. Use clean towels with each bath or shower.  Put on clean clothes after bath or shower.  Avoid using lotion and oils.  Sleep on clean sheets if bath or shower is done the night before procedure.    Morning of your procedure:  Brush your teeth and tongue  Do not smoke, vape, chew or user any tobacco products.  Do not shave the area where you will have surgery.  Remove nail polish, makeup and all jewelry (including piercings) before coming to the hospital.  Dress in clean, loose, comfortable clothing.    Valuables  Leave money, credit cards, jewelry, and any other valuables at home. If medications are being filled and picked up at a [place] pharmacy, payment will be needed. The [facility] is not responsible for the loss or breakage of personal items.    What to bring to the hospital  ID/Insurance card  Medical Device card  Official documents for legal guardianship  Copy of your Living Will, Advanced Directives, and/or Durable Power of Attorney. If you have these documents, please bring them to the admissions office on the day of your surgery to be scanned into your records.  Do not bring medications from home unless instructed by a pharmacist.  CPAP/BiPAP machine (including all supplies)  Walker, cane, or motorized scooter  Cases for glasses/hearing aids/contact lens (bring solutions for contacts)  Stimulator remote  Insulin pump and supplies as directed by pharmacy     Notify us at [facility]: [phone] on the day of your procedure if:  You need to cancel your procedure.  You are going to be late.    Notify your surgeon if:  There is a possibility that you are pregnant.  You become ill with a cough, fever, sore throat, nausea, vomiting or flu-like symptoms.  You have any open wounds/sores that are red, painful, draining, or are new since you last saw the doctor.  You need to cancel your procedure.    Preparing to get your medications at discharge  Your surgeon may prescribe you medications to take after your procedure.  If you like the convenience of having your medications filled here at [facility], please do the following:  Go to [facility] pharmacy after your [facility] appointment to put a credit card on file.  Call  pharmacy at [phone] (Monday-Friday 7am-9pm or Saturday and Sunday 9am-5pm) to put a credit card on file.  Bring a credit card or cash on the day of your procedure- please leave with a family member rather than bringing it into the preop area.    Current Visitor Policy:  Visitors must be free of fever and symptoms to be in our facilities.  No more than 2 visitors per patient are allowed.  Additional guidelines may vary, based on patient care area or patient's condition.  Patients in semiprivate rooms may have visitors, but visits should be coordinated so only two total visitors are in a room at a time due to space limitations.  Children younger than age 81 are allowed to visit inpatients.    Thank you for participating in your Preoperative Assessment visit today.    If you have any changes to your health or hospitalizations between now and your surgery, please call us at [phone].    Instructions given to patient via: verbal and printed copy

## 2022-04-09 ENCOUNTER — Encounter: Admit: 2022-04-09 | Discharge: 2022-04-09 | Payer: MEDICARE

## 2022-04-09 NOTE — Progress Notes
General Risk Score 5  HCC Score 2.711  Program criteria met -   Problem List (Diagnosis) DM    Patient was identified using dual identification of name and date of birth with repeat back    Patient Reports:    1. Plans for surgery on 4/17 to remove scar tissue around urostomy. Plans to be in hospital for 3 days, then home. Just had PAC appointment.   2. Blood sugars okay. Has been taking ozempic due to [employer]. Has regular appointments with pharmacist. Plans to restart ozempic as soon as it is back in stock. Next pharmacy appointment 04/13/2022.  3. Goes to gym with brother, Shawn Stall. Sees family every Sunday.  4. Working on diet. Has goal set with timeline and states that he is getting a handle on food situation and dialing in better.    Treatment Goals and Expected Outcomes (patient centered)     1. Good blood sugar control   2. Increase activity as tolerated for strength, balance and mobility  3. Stay as healthy as possible      Barriers to Care:     1. Complex health conditions     Care Plan:     1. Continue to follow plan of care and specialty provider appointments  2. Continue work on healthy diet, good job on cutting down soda. Lean protein, vegetables and fruit - low carb  3. Continue to increase activity as tolerated for strength, balance and mobility  4. Instructed patient to call care manager any time with questions or concerns.   5. Routing note to PCP     LOV:     10 /18/23    Upcoming appointments include:    Future Appointments   Date Time Provider [facility]   04/13/2022  1:30 PM Marrianne Mood, [id number]   04/27/2022  3:30 PM Joneen Caraway, MD [place]   08/19/2022 11:00 AM Joneen Caraway, MD [place]   10/21/2022 10:30 AM Trilby Leaver, MD [facility]       Next review Guadalupe Dawn of plan (approximate):    4-6 weeks

## 2022-04-12 ENCOUNTER — Encounter: Admit: 2022-04-12 | Discharge: 2022-04-12 | Payer: MEDICARE

## 2022-04-15 ENCOUNTER — Encounter: Admit: 2022-04-15 | Discharge: 2022-04-15 | Payer: MEDICARE

## 2022-04-15 ENCOUNTER — Ambulatory Visit: Admit: 2022-04-15 | Discharge: 2022-04-15 | Payer: MEDICARE

## 2022-04-15 DIAGNOSIS — E119 Type 2 diabetes mellitus without complications: Secondary | ICD-10-CM

## 2022-04-15 MED ORDER — METFORMIN 500 MG PO TB24
ORAL_TABLET | ORAL | 3 refills | Status: AC
Start: 2022-04-15 — End: ?

## 2022-04-15 NOTE — Progress Notes
A follow-up comprehensive medication management visit was completed today via telehealth.    Referral reason: Diabetes Management  Referring provider: Orson Gear, MD    Assessment & Plan     Diabetes  Patient's diabetes is uncontrolled as reflected by home blood sugars and A1C. Their goal glucose is pre-prandial 80-130 and post-prandial <180. Their most recent A1C was 8% on 10/28/2021 with a goal of < 7%    Patients BG has increased since switching to Ozempic (FBG average 145; ranging from 138-230 mg/dl) and are no longer at goal. Ultimately, will try to get patient back on Mounjaro once it is available. At this time will optimize Metformin dose for improved BG control. Will titrate slowly due to history of intolerance on IR formulation.       Plan  - continue Ozempic 2 mg once a week on Tuesdays   - INCREASE metformin XR 500 mg AM and 1000 mg PM      Follow-up  The patient will continue to follow up with the pharmacist. Return to pharmacist in 4 weeks via telephone. The return visit was scheduled during today's visit.    Subjective & Objective   At the last visit, the patient was instructed to switch to ozempic 2 mg due shortages. The patient states they made all of the recommended changes. Patient reports that things are going well. Prepping for surgery on 04/28/22 -- stopped taking Vascepa and MV yesterday, last dose of Ozempic will be taken 04/20/22. Will take all other medications regularly until morning of surgery. He reports that he initially experienced diarrhea the day of Ozempic administration, but that this has resolved. Tolerating metformin well.     Working on dietary changes.   Started working out with his brother 3x/week for about 1.5 hour. Doing stretching and strength training. Took a couple weeks off due to diarrhea with Ozempic, but is getting back into it this week.   Last time he checked his weight, he was 260 lb (04/07/22). Wants to get down to 250 lb.   Eating 2-3 meals a day. Lunch is usually 12:45 - dinner is around 5-5:30     Diabetes    HPI  Current diabetes medication regimen:   - metformin XR 500 mg twice daily  - Ozempic 2 mg once a week     Previous medications for diabetes:   - Mounjaro (10 mg) - switched to Ozempic due to shortage  - Trulicity - switched to [employer]   - metformin IR - diarrhea   - Januvia - switched to Trulicity     Blood Glucose  Monitoring times: fasting  The baseline HbA1C was 9.4 on 07/07/2020.    Patient reported readings:  Average 145 (previously 140 mg/dl)  Highest 454 (previously 168 mg/dL)  Lowest 098 (previously 105 mg/dL)  FBG - 119, 147, 829, 145, 158, 141    Diet and Exercise  Number of meals per day: 2  Breakfast: doesn't usually eat breakfast  Lunch: chicken salad  Dinner: mac and cheese, chicken salad, veggies, usually fairly carb heavy   Snacks: sometimes will snack throughout the day - popcorn, cookies, rice crispy, potato chips   Drinks: if not going out and wants something with his meal then will drink soda, but when he goes out will have water  Last meal of the day is usually 5 pm     Comorbidities  ASCVD condition(s): none  CKD: no  Heart failure: no    Obesity: yes  Health Maintenance  Aspirin utilization: Patient is not taking aspirin due to the following reason(s): not indicated.  Statin utilization: Patient is taking a statin.  Hypertension: Patient has hypertension, which is not controlled.    Primary Care - Labs   Basic Metabolic Profile    Lab Results   Component Value Date/Time    NA 140 02/02/2022 04:12 PM    K 4.6 02/02/2022 04:12 PM    CA 9.4 02/02/2022 04:12 PM    CL 103 02/02/2022 04:12 PM    CO2 27 02/02/2022 04:12 PM    GAP 10 02/02/2022 04:12 PM    EGFR1 >60 02/02/2022 04:12 PM    Lab Results   Component Value Date/Time    BUN 30 (H) 02/02/2022 04:12 PM    CR 1.05 02/02/2022 04:12 PM    GLU 123 (H) 02/02/2022 04:12 PM    GLU 113 (H) 10/29/2020 02:53 PM         Lab Draw:  Lab Results   Component Value Date/Time    HGBA1C 8.0 (H) 10/28/2021 02:42 PM    HGBA1C 8.6 (H) 04/29/2021 12:32 PM    HGBA1C 9.5 (H) 10/29/2020 02:53 PM     POC:  No results found for: A1C     No results found for: MCALB24   Microalbumin/CR ratio Urine   Date Value Ref Range Status   10/28/2021 54.41 (H) <30 ug/mg Final     Comment:     NOTE NEW REFERENCE RANGES     Microalbumin, Random   Date Value Ref Range Status   10/28/2021 50.6 MCG/ML Final        Lab Results   Component Value Date    CHOL 153 01/20/2022    TRIG 229 (H) 01/20/2022    HDL 33 (L) 01/20/2022    LDL 100 (H) 01/20/2022    VLDL 46 01/20/2022    NONHDLCHOL 120 01/20/2022    CHOLHDLC 6.9 (H) 10/29/2020         BP Readings from Last 1 Encounters:   04/07/22 117/74      Medication History  Medication history was not completed because previously completed (follow-up visit).    Adverse Drug Reactions  Adverse drug reactions were reviewed with the patient.    Significant adverse drug reaction(s) were not identified.    Side effect(s) were not reported.    The patient is filling their medications through 2 pharmacies due to the following reason(s): patient preference. Patient is currently using [facility] pharmacy. Patient is currently using [facility] based on patient preference.    Home Medications    Medication Sig   blood sugar diagnostic (ONETOUCH ULTRA TEST) test strip Use to check blood sugars daily. E11.65   blood-glucose meter (ACCU-CHEK GUIDE GLUCOSE METER) kit Use one strip as directed daily. Diagnosis Code: DM-2   carvediloL (COREG) 6.25 mg tablet take 2 tablets BY MOUTH TWICE DAILY WITH MEALS   cholecalciferol(+) (VITAMIN D3) 2,000 unit tablet Take one tablet by mouth daily.   cyanocobalamin (vitamin B-12) 3,000 mcg cap Take 1 capsule by mouth daily.   ezetimibe (ZETIA) 10 mg tablet Take one tablet by mouth daily.   furosemide (LASIX) 20 mg tablet Take one tablet by mouth daily as needed.   icosapent ethyL (VASCEPA) 1 gram capsule Take two capsules by mouth twice daily with meals.   lancets 33 gauge 33 gauge Use to check blood sugars daily E11.65   Leg Brace misc Bilateral Fitted leg brace  and right shoe for foot drop.  Dx: spina bifida.   levETIRAcetam (KEPPRA) 500 mg tablet Take one tablet by mouth twice daily.   losartan (COZAAR) 100 mg tablet TAKE 1 TABLET BY MOUTH ONCE DAILY   metFORMIN-XR (GLUCOPHAGE XR) 500 mg extended release tablet Take one tablet by mouth twice daily with meals.   Miscellaneous Medical Supply misc Shoe lift for right foot.  Dx: spina bifida   multivit-min/folic/vit K/lycop (ONE-A-DAY MEN'S MULTIVITAMIN PO) Take 1 tablet by mouth at bedtime daily.   Ostomy Supplies 1  misc [employer] 1 piece Urostomy bag.  Use 1 bag as needed.   psyllium seed (with sugar) (FIBER PO) Take 1 tablet by mouth twice daily.   rosuvastatin (CRESTOR) 40 mg tablet Take one tablet by mouth at bedtime daily.   semaglutide (OZEMPIC) 2 mg/dose (8 mg/3 mL) injection PEN Inject two mg under the skin every 7 days.   spironolactone (ALDACTONE) 25 mg tablet take 2 tablets BY MOUTH ONCE daily WITH FOOD   triamcinolone acetonide (TRIDERM) 0.1 % topical cream Apply  topically to affected area twice daily.  Patient taking differently: Apply  topically to affected area as Needed.      Education  Education provided: yes    Diabetes  Education components: goals of therapy    Time spent with patient: 20 minutes    Dorathy Kinsman, [place]

## 2022-04-22 ENCOUNTER — Encounter: Admit: 2022-04-22 | Discharge: 2022-04-22 | Payer: MEDICARE

## 2022-04-27 ENCOUNTER — Encounter: Admit: 2022-04-27 | Discharge: 2022-04-27 | Payer: MEDICARE

## 2022-04-27 ENCOUNTER — Ambulatory Visit: Admit: 2022-04-27 | Discharge: 2022-04-27 | Payer: MEDICARE

## 2022-04-27 DIAGNOSIS — R339 Retention of urine, unspecified: Secondary | ICD-10-CM

## 2022-04-27 DIAGNOSIS — N390 Urinary tract infection, site not specified: Secondary | ICD-10-CM

## 2022-04-27 DIAGNOSIS — N319 Neuromuscular dysfunction of bladder, unspecified: Secondary | ICD-10-CM

## 2022-04-27 DIAGNOSIS — I429 Cardiomyopathy, unspecified: Secondary | ICD-10-CM

## 2022-04-27 DIAGNOSIS — G4733 Obstructive sleep apnea (adult) (pediatric): Secondary | ICD-10-CM

## 2022-04-27 DIAGNOSIS — I10 Essential (primary) hypertension: Secondary | ICD-10-CM

## 2022-04-27 DIAGNOSIS — Z01810 Encounter for preprocedural cardiovascular examination: Secondary | ICD-10-CM

## 2022-04-27 DIAGNOSIS — R000 Tachycardia, unspecified: Secondary | ICD-10-CM

## 2022-04-27 DIAGNOSIS — T85618A Breakdown (mechanical) of other specified internal prosthetic devices, implants and grafts, initial encounter: Secondary | ICD-10-CM

## 2022-04-27 DIAGNOSIS — Q0700 Arnold-Chiari syndrome without spina bifida or hydrocephalus: Secondary | ICD-10-CM

## 2022-04-27 DIAGNOSIS — F988 Other specified behavioral and emotional disorders with onset usually occurring in childhood and adolescence: Secondary | ICD-10-CM

## 2022-04-27 DIAGNOSIS — G40909 Epilepsy, unspecified, not intractable, without status epilepticus: Secondary | ICD-10-CM

## 2022-04-27 DIAGNOSIS — Q059 Spina bifida, unspecified: Secondary | ICD-10-CM

## 2022-04-27 DIAGNOSIS — R569 Unspecified convulsions: Secondary | ICD-10-CM

## 2022-04-27 DIAGNOSIS — G919 Hydrocephalus, unspecified: Secondary | ICD-10-CM

## 2022-04-27 DIAGNOSIS — R51 Headache: Secondary | ICD-10-CM

## 2022-04-27 DIAGNOSIS — N200 Calculus of kidney: Secondary | ICD-10-CM

## 2022-04-27 DIAGNOSIS — E1165 Type 2 diabetes mellitus with hyperglycemia: Secondary | ICD-10-CM

## 2022-04-27 DIAGNOSIS — Q054 Unspecified spina bifida with hydrocephalus: Secondary | ICD-10-CM

## 2022-04-27 DIAGNOSIS — R0681 Apnea, not elsewhere classified: Secondary | ICD-10-CM

## 2022-04-28 ENCOUNTER — Encounter: Admit: 2022-04-28 | Discharge: 2022-04-28 | Payer: MEDICARE

## 2022-04-28 ENCOUNTER — Inpatient Hospital Stay: Admit: 2022-04-28 | Discharge: 2022-04-28 | Payer: MEDICARE

## 2022-04-28 MED ORDER — CEFOXITIN 2 GRAM IV SOLR
INTRAVENOUS | 0 refills | Status: DC
Start: 2022-04-28 — End: 2022-04-28

## 2022-04-28 MED ORDER — LACTATED RINGERS IV SOLP
INTRAVENOUS | 0 refills | Status: DC
Start: 2022-04-28 — End: 2022-04-28

## 2022-04-28 MED ORDER — DEXMEDETOMIDINE IN 0.9 % NACL 20 MCG/5 ML (4 MCG/ML) IV SYRG
INTRAVENOUS | 0 refills | Status: DC
Start: 2022-04-28 — End: 2022-04-28

## 2022-04-28 MED ORDER — ARTIFICIAL TEARS (PF) SINGLE DOSE DROPS GROUP
OPHTHALMIC | 0 refills | Status: DC
Start: 2022-04-28 — End: 2022-04-28

## 2022-04-28 MED ORDER — LIDOCAINE (PF) 200 MG/10 ML (2 %) IJ SYRG
INTRAVENOUS | 0 refills | Status: DC
Start: 2022-04-28 — End: 2022-04-28

## 2022-04-28 MED ORDER — PROPOFOL INJ 10 MG/ML IV VIAL
INTRAVENOUS | 0 refills | Status: DC
Start: 2022-04-28 — End: 2022-04-28

## 2022-04-28 MED ORDER — SUGAMMADEX 100 MG/ML IV SOLN
INTRAVENOUS | 0 refills | Status: DC
Start: 2022-04-28 — End: 2022-04-28

## 2022-04-28 MED ORDER — DEXAMETHASONE SODIUM PHOSPHATE 4 MG/ML IJ SOLN
INTRAVENOUS | 0 refills | Status: DC
Start: 2022-04-28 — End: 2022-04-28

## 2022-04-28 MED ORDER — ALBUMIN, HUMAN 5 % 250 ML IV SOLP (AN)(OSM)
INTRAVENOUS | 0 refills | Status: DC
Start: 2022-04-28 — End: 2022-04-28

## 2022-04-28 MED ORDER — ROCURONIUM 10 MG/ML IV SOLN
INTRAVENOUS | 0 refills | Status: DC
Start: 2022-04-28 — End: 2022-04-28

## 2022-04-28 MED ORDER — PHENYLEPHRINE 40 MCG/ML IN NS IV DRIP (STD CONC)
INTRAVENOUS | 0 refills | Status: DC
Start: 2022-04-28 — End: 2022-04-28
  Administered 2022-04-28 (×2): .5 ug/kg/min via INTRAVENOUS

## 2022-04-28 MED ORDER — HYDROMORPHONE (PF) 2 MG/ML IJ SYRG
INTRAVENOUS | 0 refills | Status: DC
Start: 2022-04-28 — End: 2022-04-28

## 2022-04-28 MED ORDER — DEXAMETHASONE SODIUM PHOSPHATE 10 MG/ML IJ SOLN
0 refills | Status: CP
Start: 2022-04-28 — End: ?

## 2022-04-28 MED ORDER — PHENYLEPHRINE HCL IN 0.9% NACL 1 MG/10 ML (100 MCG/ML) IV SYRG
INTRAVENOUS | 0 refills | Status: DC
Start: 2022-04-28 — End: 2022-04-28

## 2022-04-28 MED ORDER — ONDANSETRON HCL (PF) 4 MG/2 ML IJ SOLN
INTRAVENOUS | 0 refills | Status: DC
Start: 2022-04-28 — End: 2022-04-28

## 2022-04-28 MED ORDER — FENTANYL CITRATE (PF) 50 MCG/ML IJ SOLN
INTRAVENOUS | 0 refills | Status: DC
Start: 2022-04-28 — End: 2022-04-28

## 2022-04-28 MED ORDER — ROPIVACAINE (PF) 2 MG/ML (0.2 %) IJ SOLN
0 refills | Status: CP
Start: 2022-04-28 — End: ?

## 2022-04-28 MED ADMIN — LEVOFLOXACIN IN D5W 500 MG/100 ML IV PGBK [87854]: 500 mg | INTRAVENOUS | @ 17:00:00 | Stop: 2022-04-28 | NDC 44567043624

## 2022-04-28 MED ADMIN — SODIUM CHLORIDE 0.9 % IV SOLP [27838]: 1000 mL | INTRAVENOUS | @ 17:00:00 | Stop: 2022-04-30 | NDC 00338004904

## 2022-04-28 MED ADMIN — SODIUM CHLORIDE 0.9 % IV SOLP [27838]: 1000.000 mL | INTRAVENOUS | @ 22:00:00 | Stop: 2022-04-30 | NDC 00338004904

## 2022-04-29 MED ADMIN — LEVETIRACETAM 500 MG PO TAB [77943]: 500 mg | ORAL | @ 13:00:00 | NDC 31722053705

## 2022-04-29 MED ADMIN — LEVOFLOXACIN IN D5W 500 MG/100 ML IV PGBK [87854]: 500 mg | INTRAVENOUS | @ 21:00:00 | NDC 44567043624

## 2022-04-29 MED ADMIN — METHOCARBAMOL 750 MG PO TAB [4972]: 750 mg | ORAL | NDC 70010077005

## 2022-04-29 MED ADMIN — ACETAMINOPHEN 500 MG PO TAB [102]: 1000 mg | ORAL | @ 22:00:00 | NDC 00904673061

## 2022-04-29 MED ADMIN — CARVEDILOL 12.5 MG PO TAB [77424]: 12.5 mg | ORAL | @ 13:00:00 | NDC 00904630261

## 2022-04-29 MED ADMIN — ROSUVASTATIN 10 MG PO TAB [88503]: 40 mg | ORAL | @ 03:00:00 | NDC 00904677961

## 2022-04-29 MED ADMIN — METHOCARBAMOL 750 MG PO TAB [4972]: 750 mg | ORAL | @ 17:00:00 | NDC 70010077005

## 2022-04-29 MED ADMIN — SODIUM CHLORIDE 0.9 % IV SOLP [27838]: 1000.000 mL | INTRAVENOUS | @ 08:00:00 | Stop: 2022-04-30 | NDC 00338004904

## 2022-04-29 MED ADMIN — LOSARTAN 50 MG PO TAB [76938]: 100 mg | ORAL | @ 13:00:00 | NDC 68084034711

## 2022-04-29 MED ADMIN — OXYCODONE 15 MG PO TAB [28899]: 15 mg | ORAL | @ 17:00:00 | NDC 00406851523

## 2022-04-29 MED ADMIN — ACETAMINOPHEN 500 MG PO TAB [102]: 1000 mg | ORAL | @ 17:00:00 | NDC 00904673061

## 2022-04-29 MED ADMIN — LEVETIRACETAM 500 MG PO TAB [77943]: 500 mg | ORAL | @ 03:00:00 | NDC 31722053705

## 2022-04-29 MED ADMIN — OXYCODONE 5 MG PO TAB [10814]: 5 mg | ORAL | Stop: 2022-04-29 | NDC 00904696661

## 2022-04-29 MED ADMIN — EZETIMIBE 10 MG PO TAB [86887]: 10 mg | ORAL | @ 13:00:00 | NDC 67877049090

## 2022-04-29 MED ADMIN — SODIUM CHLORIDE 0.9 % IV SOLP [27838]: 1000.000 mL | INTRAVENOUS | @ 20:00:00 | Stop: 2022-04-30 | NDC 00338004904

## 2022-04-29 MED ADMIN — OXYCODONE 5 MG PO TAB [10814]: 15 mg | ORAL | @ 11:00:00 | NDC 00904696661

## 2022-04-29 MED ADMIN — OXYCODONE 15 MG PO TAB [28899]: 15 mg | ORAL | @ 21:00:00 | NDC 00406851523

## 2022-04-29 MED ADMIN — SODIUM CHLORIDE 0.9 % IV SOLP [27838]: 1000.000 mL | INTRAVENOUS | Stop: 2022-04-30 | NDC 00338004904

## 2022-04-29 MED ADMIN — SPIRONOLACTONE 50 MG PO TAB [11426]: 50 mg | ORAL | @ 13:00:00 | NDC 60687047611

## 2022-04-29 MED ADMIN — ENOXAPARIN 40 MG/0.4 ML SC SYRG [85052]: 40 mg | SUBCUTANEOUS | @ 03:00:00 | NDC 71288043382

## 2022-04-30 ENCOUNTER — Encounter: Admit: 2022-04-30 | Discharge: 2022-04-30 | Payer: MEDICARE

## 2022-04-30 ENCOUNTER — Inpatient Hospital Stay: Admit: 2022-04-30 | Discharge: 2022-04-30 | Payer: MEDICARE

## 2022-04-30 DIAGNOSIS — I429 Cardiomyopathy, unspecified: Secondary | ICD-10-CM

## 2022-04-30 DIAGNOSIS — Q0700 Arnold-Chiari syndrome without spina bifida or hydrocephalus: Secondary | ICD-10-CM

## 2022-04-30 DIAGNOSIS — R569 Unspecified convulsions: Secondary | ICD-10-CM

## 2022-04-30 DIAGNOSIS — N200 Calculus of kidney: Secondary | ICD-10-CM

## 2022-04-30 DIAGNOSIS — N390 Urinary tract infection, site not specified: Secondary | ICD-10-CM

## 2022-04-30 DIAGNOSIS — T85618A Breakdown (mechanical) of other specified internal prosthetic devices, implants and grafts, initial encounter: Secondary | ICD-10-CM

## 2022-04-30 DIAGNOSIS — R000 Tachycardia, unspecified: Secondary | ICD-10-CM

## 2022-04-30 DIAGNOSIS — R339 Retention of urine, unspecified: Secondary | ICD-10-CM

## 2022-04-30 DIAGNOSIS — I10 Essential (primary) hypertension: Secondary | ICD-10-CM

## 2022-04-30 DIAGNOSIS — F988 Other specified behavioral and emotional disorders with onset usually occurring in childhood and adolescence: Secondary | ICD-10-CM

## 2022-04-30 DIAGNOSIS — G4733 Obstructive sleep apnea (adult) (pediatric): Secondary | ICD-10-CM

## 2022-04-30 DIAGNOSIS — Z01810 Encounter for preprocedural cardiovascular examination: Secondary | ICD-10-CM

## 2022-04-30 DIAGNOSIS — N319 Neuromuscular dysfunction of bladder, unspecified: Secondary | ICD-10-CM

## 2022-04-30 DIAGNOSIS — Q059 Spina bifida, unspecified: Secondary | ICD-10-CM

## 2022-04-30 DIAGNOSIS — R51 Headache: Secondary | ICD-10-CM

## 2022-04-30 DIAGNOSIS — G919 Hydrocephalus, unspecified: Secondary | ICD-10-CM

## 2022-04-30 MED ADMIN — LOSARTAN 50 MG PO TAB [76938]: 100 mg | ORAL | @ 14:00:00 | NDC 68084034711

## 2022-04-30 MED ADMIN — MAGNESIUM SULFATE IN D5W 1 GRAM/100 ML IV PGBK [166578]: 1 g | INTRAVENOUS | @ 14:00:00 | Stop: 2022-04-30 | NDC 44567041024

## 2022-04-30 MED ADMIN — SPIRONOLACTONE 50 MG PO TAB [11426]: 50 mg | ORAL | @ 14:00:00 | NDC 60687047611

## 2022-04-30 MED ADMIN — ACETAMINOPHEN 500 MG PO TAB [102]: 1000 mg | ORAL | @ 23:00:00 | NDC 00904673061

## 2022-04-30 MED ADMIN — ACETAMINOPHEN 500 MG PO TAB [102]: 1000 mg | ORAL | @ 04:00:00 | NDC 00904673061

## 2022-04-30 MED ADMIN — OXYCODONE 15 MG PO TAB [28899]: 15 mg | ORAL | @ 16:00:00 | NDC 00406851523

## 2022-04-30 MED ADMIN — LEVOFLOXACIN IN D5W 500 MG/100 ML IV PGBK [87854]: 500 mg | INTRAVENOUS | @ 20:00:00 | NDC 44567043624

## 2022-04-30 MED ADMIN — ACETAMINOPHEN 500 MG PO TAB [102]: 1000 mg | ORAL | @ 16:00:00 | NDC 00904673061

## 2022-04-30 MED ADMIN — LEVETIRACETAM 500 MG PO TAB [77943]: 500 mg | ORAL | @ 14:00:00 | NDC 31722053705

## 2022-04-30 MED ADMIN — ENOXAPARIN 40 MG/0.4 ML SC SYRG [85052]: 40 mg | SUBCUTANEOUS | @ 01:00:00 | NDC 71288043382

## 2022-04-30 MED ADMIN — METHOCARBAMOL 750 MG PO TAB [4972]: 750 mg | ORAL | @ 09:00:00 | NDC 70010077005

## 2022-04-30 MED ADMIN — ROSUVASTATIN 10 MG PO TAB [88503]: 40 mg | ORAL | @ 01:00:00 | NDC 00904677961

## 2022-04-30 MED ADMIN — EZETIMIBE 10 MG PO TAB [86887]: 10 mg | ORAL | @ 14:00:00 | NDC 67877049090

## 2022-04-30 MED ADMIN — LACTATED RINGERS IV SOLP [4318]: 1000 mL | INTRAVENOUS | @ 18:00:00 | Stop: 2022-04-30 | NDC 00338011704

## 2022-04-30 MED ADMIN — METHOCARBAMOL 750 MG PO TAB [4972]: 750 mg | ORAL | NDC 70010077005

## 2022-04-30 MED ADMIN — CARVEDILOL 12.5 MG PO TAB [77424]: 12.5 mg | ORAL | @ 01:00:00 | NDC 00904630261

## 2022-04-30 MED ADMIN — OXYCODONE 15 MG PO TAB [28899]: 15 mg | ORAL | @ 20:00:00 | NDC 00406851523

## 2022-04-30 MED ADMIN — CARVEDILOL 12.5 MG PO TAB [77424]: 12.5 mg | ORAL | @ 14:00:00 | Stop: 2022-04-30 | NDC 00904630261

## 2022-04-30 MED ADMIN — LEVETIRACETAM 500 MG PO TAB [77943]: 500 mg | ORAL | @ 01:00:00 | NDC 31722053705

## 2022-04-30 MED ADMIN — METHOCARBAMOL 750 MG PO TAB [4972]: 750 mg | ORAL | @ 16:00:00 | NDC 70010077005

## 2022-05-01 MED ADMIN — MELATONIN 5 MG PO TAB [168576]: 5 mg | ORAL | @ 03:00:00 | NDC 77333052025

## 2022-05-01 MED ADMIN — SODIUM CHLORIDE 0.9 % IV SOLP [27838]: 1000.000 mL | INTRAVENOUS | @ 03:00:00 | Stop: 2022-05-02 | NDC 00338004904

## 2022-05-01 MED ADMIN — METHOCARBAMOL 750 MG PO TAB [4972]: 750 mg | ORAL | @ 09:00:00 | NDC 70010077005

## 2022-05-01 MED ADMIN — ACETAMINOPHEN 500 MG PO TAB [102]: 1000 mg | ORAL | @ 03:00:00 | NDC 00904673061

## 2022-05-01 MED ADMIN — SENNOSIDES-DOCUSATE SODIUM 8.6-50 MG PO TAB [40926]: 1 | ORAL | @ 14:00:00 | NDC 00536124810

## 2022-05-01 MED ADMIN — OXYCODONE 15 MG PO TAB [28899]: 15 mg | ORAL | @ 07:00:00 | NDC 00406851523

## 2022-05-01 MED ADMIN — CARVEDILOL 12.5 MG PO TAB [77424]: 12.5 mg | ORAL | @ 03:00:00 | NDC 00904630261

## 2022-05-01 MED ADMIN — LEVETIRACETAM 500 MG PO TAB [77943]: 500 mg | ORAL | @ 14:00:00 | NDC 31722053705

## 2022-05-01 MED ADMIN — MAGNESIUM HYDROXIDE 400 MG/5 ML PO SUSP [79944]: 30 mL | ORAL | @ 20:00:00 | NDC 57237031403

## 2022-05-01 MED ADMIN — LEVOFLOXACIN IN D5W 500 MG/100 ML IV PGBK [87854]: 500 mg | INTRAVENOUS | @ 20:00:00 | NDC 44567043624

## 2022-05-01 MED ADMIN — ENOXAPARIN 40 MG/0.4 ML SC SYRG [85052]: 40 mg | SUBCUTANEOUS | @ 03:00:00 | NDC 71288043382

## 2022-05-01 MED ADMIN — ACETAMINOPHEN 500 MG PO TAB [102]: 1000 mg | ORAL | @ 23:00:00 | NDC 00904673061

## 2022-05-01 MED ADMIN — ACETAMINOPHEN 500 MG PO TAB [102]: 1000 mg | ORAL | @ 17:00:00 | NDC 00904673061

## 2022-05-01 MED ADMIN — ROSUVASTATIN 10 MG PO TAB [88503]: 40 mg | ORAL | @ 03:00:00 | NDC 00904677961

## 2022-05-01 MED ADMIN — METHOCARBAMOL 750 MG PO TAB [4972]: 750 mg | ORAL | @ 17:00:00 | NDC 70010077005

## 2022-05-01 MED ADMIN — ACETAMINOPHEN 500 MG PO TAB [102]: 1000 mg | ORAL | @ 09:00:00 | NDC 00904673061

## 2022-05-01 MED ADMIN — LEVETIRACETAM 500 MG PO TAB [77943]: 500 mg | ORAL | @ 03:00:00 | NDC 31722053705

## 2022-05-01 MED ADMIN — EZETIMIBE 10 MG PO TAB [86887]: 10 mg | ORAL | @ 14:00:00 | NDC 67877049090

## 2022-05-02 MED ADMIN — SENNOSIDES-DOCUSATE SODIUM 8.6-50 MG PO TAB [40926]: 1 | ORAL | @ 15:00:00 | NDC 00536124810

## 2022-05-02 MED ADMIN — LEVETIRACETAM 500 MG PO TAB [77943]: 500 mg | ORAL | @ 01:00:00 | NDC 00904712461

## 2022-05-02 MED ADMIN — ACETAMINOPHEN 500 MG PO TAB [102]: 1000 mg | ORAL | @ 09:00:00 | NDC 00904673061

## 2022-05-02 MED ADMIN — ENOXAPARIN 40 MG/0.4 ML SC SYRG [85052]: 40 mg | SUBCUTANEOUS | @ 01:00:00 | NDC 71288043382

## 2022-05-02 MED ADMIN — BISACODYL 10 MG RE SUPP [1080]: 10 mg | RECTAL | @ 21:00:00 | NDC 00574705012

## 2022-05-02 MED ADMIN — METHOCARBAMOL 750 MG PO TAB [4972]: 750 mg | ORAL | @ 17:00:00 | NDC 70010077005

## 2022-05-02 MED ADMIN — SENNOSIDES-DOCUSATE SODIUM 8.6-50 MG PO TAB [40926]: 1 | ORAL | @ 01:00:00 | NDC 00536124810

## 2022-05-02 MED ADMIN — ROSUVASTATIN 10 MG PO TAB [88503]: 40 mg | ORAL | @ 01:00:00 | NDC 00904677961

## 2022-05-02 MED ADMIN — LEVOFLOXACIN IN D5W 500 MG/100 ML IV PGBK [87854]: 500 mg | INTRAVENOUS | @ 20:00:00 | NDC 44567043624

## 2022-05-02 MED ADMIN — ACETAMINOPHEN 500 MG PO TAB [102]: 1000 mg | ORAL | @ 17:00:00 | NDC 00904673061

## 2022-05-02 MED ADMIN — MELATONIN 5 MG PO TAB [168576]: 5 mg | ORAL | @ 01:00:00 | NDC 77333052025

## 2022-05-02 MED ADMIN — LACTATED RINGERS IV SOLP [4318]: 1000.000 mL | INTRAVENOUS | @ 15:00:00 | Stop: 2022-05-04 | NDC 00338011704

## 2022-05-02 MED ADMIN — LEVETIRACETAM 500 MG PO TAB [77943]: 500 mg | ORAL | @ 15:00:00 | NDC 31722053705

## 2022-05-02 MED ADMIN — EZETIMIBE 10 MG PO TAB [86887]: 10 mg | ORAL | @ 15:00:00 | NDC 67877049090

## 2022-05-02 MED ADMIN — SODIUM CHLORIDE 0.9 % IV SOLP [27838]: 1000.000 mL | INTRAVENOUS | @ 01:00:00 | Stop: 2022-05-02 | NDC 00338004904

## 2022-05-02 MED ADMIN — CARVEDILOL 12.5 MG PO TAB [77424]: 12.5 mg | ORAL | @ 15:00:00 | NDC 00904630261

## 2022-05-02 MED ADMIN — METHOCARBAMOL 750 MG PO TAB [4972]: 750 mg | ORAL | @ 01:00:00 | NDC 70010077005

## 2022-05-02 MED ADMIN — METHOCARBAMOL 750 MG PO TAB [4972]: 750 mg | ORAL | @ 09:00:00 | NDC 70010077005

## 2022-05-03 ENCOUNTER — Encounter: Admit: 2022-05-03 | Discharge: 2022-05-03 | Payer: MEDICARE

## 2022-05-03 MED ADMIN — SENNOSIDES-DOCUSATE SODIUM 8.6-50 MG PO TAB [40926]: 1 | ORAL | @ 14:00:00 | NDC 00536124810

## 2022-05-03 MED ADMIN — ROSUVASTATIN 10 MG PO TAB [88503]: 40 mg | ORAL | @ 01:00:00 | NDC 00904677961

## 2022-05-03 MED ADMIN — ACETAMINOPHEN 500 MG PO TAB [102]: 1000 mg | ORAL | @ 09:00:00 | NDC 00904673061

## 2022-05-03 MED ADMIN — METHOCARBAMOL 750 MG PO TAB [4972]: 750 mg | ORAL | @ 09:00:00 | NDC 70010077005

## 2022-05-03 MED ADMIN — METHOCARBAMOL 750 MG PO TAB [4972]: 750 mg | ORAL | @ 16:00:00 | NDC 70010077005

## 2022-05-03 MED ADMIN — LEVETIRACETAM 500 MG PO TAB [77943]: 500 mg | ORAL | @ 01:00:00 | NDC 31722053705

## 2022-05-03 MED ADMIN — MELATONIN 5 MG PO TAB [168576]: 5 mg | ORAL | @ 01:00:00 | NDC 77333052025

## 2022-05-03 MED ADMIN — LEVOFLOXACIN IN D5W 500 MG/100 ML IV PGBK [87854]: 500 mg | INTRAVENOUS | @ 20:00:00 | NDC 44567043624

## 2022-05-03 MED ADMIN — METFORMIN 500 MG PO TAB [10544]: 500 mg | ORAL | @ 22:00:00 | NDC 70010006301

## 2022-05-03 MED ADMIN — SENNOSIDES-DOCUSATE SODIUM 8.6-50 MG PO TAB [40926]: 1 | ORAL | @ 01:00:00 | NDC 00536124810

## 2022-05-03 MED ADMIN — CARVEDILOL 12.5 MG PO TAB [77424]: 12.5 mg | ORAL | @ 14:00:00 | NDC 00904630261

## 2022-05-03 MED ADMIN — ENOXAPARIN 40 MG/0.4 ML SC SYRG [85052]: 40 mg | SUBCUTANEOUS | @ 01:00:00 | NDC 71288043382

## 2022-05-03 MED ADMIN — LEVETIRACETAM 500 MG PO TAB [77943]: 500 mg | ORAL | @ 14:00:00 | NDC 31722053705

## 2022-05-03 MED ADMIN — EZETIMIBE 10 MG PO TAB [86887]: 10 mg | ORAL | @ 14:00:00 | NDC 67877049090

## 2022-05-03 MED ADMIN — ACETAMINOPHEN 500 MG PO TAB [102]: 1000 mg | ORAL | @ 16:00:00 | NDC 00904673061

## 2022-05-03 MED ADMIN — METHOCARBAMOL 750 MG PO TAB [4972]: 750 mg | ORAL | NDC 70010077005

## 2022-05-03 MED ADMIN — ENOXAPARIN 40 MG/0.4 ML SC SYRG [85052]: 40 mg | SUBCUTANEOUS | @ 16:00:00 | NDC 71288043382

## 2022-05-04 ENCOUNTER — Encounter: Admit: 2022-05-04 | Discharge: 2022-05-04 | Payer: MEDICARE

## 2022-05-04 MED ADMIN — ROSUVASTATIN 10 MG PO TAB [88503]: 40 mg | ORAL | @ 02:00:00 | NDC 00904677961

## 2022-05-04 MED ADMIN — OXYCODONE 5 MG PO TAB [10814]: 15 mg | ORAL | @ 12:00:00 | Stop: 2022-05-05 | NDC 00904696661

## 2022-05-04 MED ADMIN — ACETAMINOPHEN 500 MG PO TAB [102]: 1000 mg | ORAL | @ 17:00:00 | Stop: 2022-05-05 | NDC 00904673061

## 2022-05-04 MED ADMIN — ENOXAPARIN 40 MG/0.4 ML SC SYRG [85052]: 40 mg | SUBCUTANEOUS | @ 13:00:00 | Stop: 2022-05-05 | NDC 71288043382

## 2022-05-04 MED ADMIN — METFORMIN 500 MG PO TAB [10544]: 500 mg | ORAL | @ 13:00:00 | Stop: 2022-05-05 | NDC 70010006301

## 2022-05-04 MED ADMIN — LEVETIRACETAM 500 MG PO TAB [77943]: 500 mg | ORAL | @ 02:00:00 | NDC 31722053705

## 2022-05-04 MED ADMIN — LEVOFLOXACIN IN D5W 500 MG/100 ML IV PGBK [87854]: 500 mg | INTRAVENOUS | @ 21:00:00 | Stop: 2022-05-05 | NDC 44567043624

## 2022-05-04 MED ADMIN — METHOCARBAMOL 750 MG PO TAB [4972]: 750 mg | ORAL | @ 02:00:00 | NDC 70010077001

## 2022-05-04 MED ADMIN — METFORMIN 500 MG PO TAB [10544]: 500 mg | ORAL | @ 22:00:00 | Stop: 2022-05-05 | NDC 70010006301

## 2022-05-04 MED ADMIN — METHOCARBAMOL 750 MG PO TAB [4972]: 750 mg | ORAL | @ 17:00:00 | Stop: 2022-05-05 | NDC 70010077001

## 2022-05-04 MED ADMIN — ACETAMINOPHEN 500 MG PO TAB [102]: 1000 mg | ORAL | @ 04:00:00 | NDC 00904673061

## 2022-05-04 MED ADMIN — METHOCARBAMOL 750 MG PO TAB [4972]: 750 mg | ORAL | @ 09:00:00 | Stop: 2022-05-05 | NDC 70010077001

## 2022-05-04 MED ADMIN — LEVETIRACETAM 500 MG PO TAB [77943]: 500 mg | ORAL | @ 13:00:00 | Stop: 2022-05-05 | NDC 00904712461

## 2022-05-04 MED ADMIN — ACETAMINOPHEN 500 MG PO TAB [102]: 1000 mg | ORAL | @ 09:00:00 | Stop: 2022-05-05 | NDC 00904673061

## 2022-05-04 MED ADMIN — EZETIMIBE 10 MG PO TAB [86887]: 10 mg | ORAL | @ 13:00:00 | Stop: 2022-05-05 | NDC 67877049090

## 2022-05-04 MED ADMIN — ACETAMINOPHEN 500 MG PO TAB [102]: 1000 mg | ORAL | @ 22:00:00 | Stop: 2022-05-05 | NDC 00904673061

## 2022-05-04 MED ADMIN — ENOXAPARIN 40 MG/0.4 ML SC SYRG [85052]: 40 mg | SUBCUTANEOUS | @ 02:00:00 | NDC 71288043382

## 2022-05-04 NOTE — Progress Notes
Referral printed and faxed along with patients demographics, insurance cards, and clinic notes.  Faxed to [facility] @ [phone].

## 2022-05-07 ENCOUNTER — Encounter: Admit: 2022-05-07 | Discharge: 2022-05-07 | Payer: MEDICARE

## 2022-05-07 MED FILL — OZEMPIC 2 MG/DOSE (8 MG/3 ML) SC PNIJ: 2 mg/dose (8 mg/3 mL) | SUBCUTANEOUS | 28 days supply | Qty: 3 | Fill #2 | Status: AC

## 2022-05-10 ENCOUNTER — Encounter: Admit: 2022-05-10 | Discharge: 2022-05-10 | Payer: MEDICARE

## 2022-05-11 ENCOUNTER — Encounter: Admit: 2022-05-11 | Discharge: 2022-05-11 | Payer: MEDICARE

## 2022-05-12 ENCOUNTER — Encounter: Admit: 2022-05-12 | Discharge: 2022-05-12 | Payer: MEDICARE

## 2022-05-12 MED FILL — LEVOFLOXACIN 500 MG PO TAB: 500 mg | ORAL | 3 days supply | Qty: 3 | Fill #1 | Status: AC

## 2022-05-13 ENCOUNTER — Encounter: Admit: 2022-05-13 | Discharge: 2022-05-13 | Payer: MEDICARE

## 2022-05-13 ENCOUNTER — Ambulatory Visit: Admit: 2022-05-13 | Discharge: 2022-05-13 | Payer: MEDICARE

## 2022-05-13 DIAGNOSIS — E119 Type 2 diabetes mellitus without complications: Secondary | ICD-10-CM

## 2022-05-13 DIAGNOSIS — E1165 Type 2 diabetes mellitus with hyperglycemia: Secondary | ICD-10-CM

## 2022-05-13 MED ORDER — MOUNJARO 10 MG/0.5 ML SC PNIJ
10 mg | SUBCUTANEOUS | 1 refills | Status: AC
Start: 2022-05-13 — End: ?
  Filled 2022-05-19: qty 2, 28d supply, fill #1

## 2022-05-13 NOTE — Telephone Encounter
Received VM from [place], RN with [facility] asking Dr. Larina Bras to follow for [facility] orders on patient.     LOV 04/27/2022.    Gave verbal orders to follow.    Routed to Dr. Larina Bras.

## 2022-05-13 NOTE — Progress Notes
A follow-up comprehensive medication management visit was completed today via telehealth.    Referral reason: Diabetes Management  Referring provider: Orson Gear, MD    Assessment & Plan     Diabetes  Patient's diabetes is uncontrolled as reflected by home blood sugars and A1C. Their goal glucose is pre-prandial 80-130 and post-prandial <180. Their most recent A1C was 8.3% on 04/27/2022 with a goal of < 7%    Was able to increase metformin dose with no issue, blood sugars still running higher than when he was on Mounjaro. Reviewed stock at [facility] and it appears they have Mounjaro 10 mg available so will see if can get him back on Mounjaro and continue with dose titration.     Plan  - STOP Ozempic and START Mounjaro 10 mg once a week if able to get from pharmacy  - Continue metformin XR 500 mg AM and 1000 mg PM    Hypertension  Patient's blood pressure is controlled per home readings. Their goal blood pressure is < 130/80 mmHg    Patient reported having lower blood pressure readings despite being off carvedilol. Recommended patient stay off carvedilol for now, continue monitoring his blood pressures 1-2 times daily, and then contact his cardiologist's office for review.       Follow-up  The patient will continue to follow up with the pharmacist. Return to pharmacist in 5 weeks via telephone. The return visit was scheduled during today's visit.    Subjective & Objective   At the last visit, the patient was instructed to increase metformin XR to 500 mg in the morning and 1000 mg in the evening. The patient states they made all of the recommended changes.     Just got home from rehab center this morning after his recent surgery. Was in hospital from 4/17-4/22. Rehab for the past 9 days.   Last dose of ozempic was 4/9 before surgery. Received a dose this Tuesday 4/30 - no stomach issues     Did have some diarrhea because he received metformin IR at the rehab center instead of XR. Received IR tablets for 4 days before it was discovered and fixed.     Did have some dizziness yesterday because he wasn't not drinking enough water but otherwise not usually.   His blood pressures have been running low while at rehab center:  117/64 HR 116 - while on the phone  Lowest - 66/44     Has been holding carvedilol at rehab center due to low blood pressures.     Last time he checked his weight, he was 260 lb (04/07/22). Wants to get down to 250 lb.   Eating 2-3 meals a day. Lunch is usually 12:45 - dinner is around 5-5:30     Diabetes    HPI  Current diabetes medication regimen:   - metformin XR 500 mg in the morning and 1000 mg in the evening   - Ozempic 2 mg once a week on Tuesdays     Previous medications for diabetes:   - Mounjaro (10 mg) - switched to Ozempic due to shortage  - Trulicity - switched to [employer]   - metformin IR - diarrhea   - Januvia - switched to Trulicity     Blood Glucose  Monitoring times: fasting  The baseline HbA1C was 9.4 on 07/07/2020.    Patient reported readings:  Low 90  High 202   Avg 130-135     Diet and Exercise  Number of meals  per day: 2  Breakfast: doesn't usually eat breakfast  Lunch: chicken salad  Dinner: mac and cheese, chicken salad, veggies, usually fairly carb heavy   Snacks: sometimes will snack throughout the day - popcorn, cookies, rice crispy, potato chips   Drinks: if not going out and wants something with his meal then will drink soda, but when he goes out will have water  Last meal of the day is usually 5 pm     Comorbidities  ASCVD condition(s): none  CKD: no  Heart failure: no    Obesity: yes    Health Maintenance  Aspirin utilization: Patient is not taking aspirin due to the following reason(s): not indicated.  Statin utilization: Patient is taking a statin.  Hypertension: Patient has hypertension, which is not controlled.    Primary Care - Labs   Basic Metabolic Profile    Lab Results   Component Value Date/Time    NA 141 05/04/2022 03:19 AM    K 4.0 05/04/2022 03:19 AM    CA 8.2 (L) 05/04/2022 03:19 AM    CL 107 05/04/2022 03:19 AM    CO2 25 05/04/2022 03:19 AM    GAP 9 05/04/2022 03:19 AM    EGFR1 >60 05/04/2022 03:19 AM    Lab Results   Component Value Date/Time    BUN 14 05/04/2022 03:19 AM    CR 0.85 05/04/2022 03:19 AM    GLU 131 (H) 05/04/2022 03:19 AM    GLU 113 (H) 10/29/2020 02:53 PM         Lab Draw:  Lab Results   Component Value Date/Time    HGBA1C 8.0 (H) 10/28/2021 02:42 PM    HGBA1C 8.6 (H) 04/29/2021 12:32 PM    HGBA1C 9.5 (H) 10/29/2020 02:53 PM     POC:  Lab Results   Component Value Date/Time    A1C 8.3 (A) 04/27/2022 12:00 AM        No results found for: MCALB24   Microalbumin/CR ratio Urine   Date Value Ref Range Status   10/28/2021 54.41 (H) <30 ug/mg Final     Comment:     NOTE NEW REFERENCE RANGES     Microalbumin, Random   Date Value Ref Range Status   10/28/2021 50.6 MCG/ML Final        Lab Results   Component Value Date    CHOL 153 01/20/2022    TRIG 229 (H) 01/20/2022    HDL 33 (L) 01/20/2022    LDL 100 (H) 01/20/2022    VLDL 46 01/20/2022    NONHDLCHOL 120 01/20/2022    CHOLHDLC 6.9 (H) 10/29/2020         BP Readings from Last 1 Encounters:   05/04/22 103/55      Medication History  Medication history was not completed because previously completed (follow-up visit).    Adverse Drug Reactions  Adverse drug reactions were reviewed with the patient.    Significant adverse drug reaction(s) were not identified.    Side effect(s) were not reported.    The patient is filling their medications through 2 pharmacies due to the following reason(s): patient preference. Patient is currently using [facility] pharmacy. Patient is currently using [facility] based on patient preference.    Home Medications    Medication Sig   acetaminophen (TYLENOL EXTRA STRENGTH) 500 mg tablet Take two tablets by mouth every 6 hours as needed for Pain. Max of 4,000 mg of acetaminophen in 24 hours.  Indications: pain  blood sugar diagnostic (ONETOUCH ULTRA TEST) test strip Use to check blood sugars daily. E11.65   blood-glucose meter (ACCU-CHEK GUIDE GLUCOSE METER) kit Use one strip as directed daily. Diagnosis Code: DM-2   carvediloL (COREG) 6.25 mg tablet take 2 tablets BY MOUTH TWICE DAILY WITH MEALS   cholecalciferol(+) (VITAMIN D3) 2,000 unit tablet Take one tablet by mouth daily.   cyanocobalamin (vitamin B-12) 3,000 mcg cap Take 1 capsule by mouth daily.   ezetimibe (ZETIA) 10 mg tablet Take one tablet by mouth daily.   furosemide (LASIX) 20 mg tablet Take one tablet by mouth daily as needed.   icosapent ethyL (VASCEPA) 1 gram capsule Take two capsules by mouth twice daily with meals.   lancets 33 gauge 33 gauge Use to check blood sugars daily E11.65   Leg Brace misc Bilateral Fitted leg brace and right shoe for foot drop.  Dx: spina bifida.   levETIRAcetam (KEPPRA) 500 mg tablet Take one tablet by mouth twice daily.   levoFLOXacin (LEVAQUIN) 500 mg tablet Take one tablet by mouth daily for 3 days. Start taking 1 day prior to your clinic appointment where Dr. Oren Bracket will remove your stents. Continue taking day of and day after  Indications: infection prophylaxis, surgical   losartan (COZAAR) 100 mg tablet TAKE 1 TABLET BY MOUTH ONCE DAILY   metFORMIN-XR (GLUCOPHAGE XR) 500 mg extended release tablet Take one tablet by mouth daily with breakfast AND two tablets daily with dinner.   methocarbamoL (ROBAXIN) 750 mg tablet Take one tablet by mouth three times daily as needed for Spasms. Indications: muscle spasm   Miscellaneous Medical Supply misc Shoe lift for right foot.  Dx: spina bifida   multivit-min/folic/vit K/lycop (ONE-A-DAY MEN'S MULTIVITAMIN PO) Take 1 tablet by mouth at bedtime daily.   Ostomy Supplies 1  misc [employer] 1 piece Urostomy bag.  Use 1 bag as needed.   oxyCODONE (ROXICODONE) 5 mg tablet Take one tablet to three tablets by mouth every 4 hours as needed. Indications: pain   psyllium seed (with sugar) (FIBER PO) Take 1 tablet by mouth twice daily. rosuvastatin (CRESTOR) 40 mg tablet Take one tablet by mouth at bedtime daily.   semaglutide (OZEMPIC) 2 mg/dose (8 mg/3 mL) injection PEN Inject two mg under the skin every 7 days.   sennosides-docusate sodium (SENOKOT-S) 8.6/50 mg tablet Take one tablet by mouth twice daily. Indications: constipation   spironolactone (ALDACTONE) 25 mg tablet take 2 tablets BY MOUTH ONCE daily WITH FOOD   triamcinolone acetonide (TRIDERM) 0.1 % topical cream Apply  topically to affected area twice daily.  Patient taking differently: Apply  topically to affected area as Needed.      Education  Education provided: yes    Diabetes  Education components: goals of therapy    Time spent with patient: 20 minutes    Reece Leader, [facility]

## 2022-05-15 ENCOUNTER — Encounter: Admit: 2022-05-15 | Discharge: 2022-05-15 | Payer: MEDICARE

## 2022-05-15 LAB — POC LACTATE: LACTIC ACID POC: 1 MMOL/L (ref 0.5–2.0)

## 2022-05-15 LAB — POC CREATININE, RAD: CREATININE, POC: 1.9 mg/dL — ABNORMAL HIGH (ref 0.4–1.24)

## 2022-05-15 LAB — POC BLOOD GAS VEN
BICARB, VEN POC: 26 MMOL/L
O2 SAT, VEN POC: 38 % — ABNORMAL LOW (ref 55–71)
PCO2, VEN POC: 46 mmHg (ref 36–50)
PH, VEN POC: 7.3 (ref 7.30–7.40)

## 2022-05-15 LAB — POC POTASSIUM: POTASSIUM, POC: 4.8 MMOL/L (ref 3.5–5.1)

## 2022-05-15 LAB — POC HEMATOCRIT
HEMATOCRIT POC: 39 % — ABNORMAL LOW (ref 40–50)
HEMOGLOBIN POC: 13 g/dL — ABNORMAL LOW (ref 13.5–16.5)

## 2022-05-15 LAB — POC SODIUM: SODIUM, POC: 136 MMOL/L — ABNORMAL LOW (ref 137–147)

## 2022-05-15 MED ORDER — LACTATED RINGERS IV SOLP
1000 mL | INTRAVENOUS | 0 refills | Status: CP
Start: 2022-05-15 — End: ?
  Administered 2022-05-16: 05:00:00 1000 mL via INTRAVENOUS

## 2022-05-16 ENCOUNTER — Emergency Department: Admit: 2022-05-16 | Discharge: 2022-05-16 | Payer: MEDICARE

## 2022-05-16 ENCOUNTER — Emergency Department: Admit: 2022-05-16 | Discharge: 2022-05-15 | Payer: MEDICARE

## 2022-05-16 MED ADMIN — MULTIVIT-IRON-FA-CALCIUM-MINS 9 MG IRON-400 MCG PO TAB [172795]: 1 | ORAL | @ 14:00:00 | NDC 40985022368

## 2022-05-16 MED ADMIN — LACTATED RINGERS IV SOLP [4318]: 1000 mL | INTRAVENOUS | @ 09:00:00 | Stop: 2022-05-16 | NDC 00338011704

## 2022-05-16 MED ADMIN — INSULIN ASPART 100 UNIT/ML SC FLEXPEN [87504]: 1 [IU] | SUBCUTANEOUS | @ 21:00:00 | NDC 00169633910

## 2022-05-16 MED ADMIN — LACTATED RINGERS IV SOLP [4318]: 500 mL | INTRAVENOUS | @ 12:00:00 | Stop: 2022-05-16 | NDC 00338011704

## 2022-05-16 MED ADMIN — SODIUM CHLORIDE 0.9 % IJ SOLN [7319]: 50 mL | INTRAVENOUS | @ 05:00:00 | Stop: 2022-05-16 | NDC 00409488820

## 2022-05-16 MED ADMIN — HEPARIN (PORCINE) 10,000 UNIT/ML IJ SOLN (IMS) [211189]: 7500 [IU] | SUBCUTANEOUS | @ 18:00:00 | NDC 63323054209

## 2022-05-16 MED ADMIN — NOREPINEPHRINE BITARTRATE-NACL 4 MG/250 ML (16 MCG/ML) IV SOLN [173409]: 0.14 ug/kg/min | INTRAVENOUS | @ 23:00:00 | NDC 44567064001

## 2022-05-16 MED ADMIN — NOREPINEPHRINE BITARTRATE-NACL 4 MG/250 ML (16 MCG/ML) IV SOLN [173409]: 0.21 ug/kg/min | INTRAVENOUS | @ 15:00:00 | NDC 44567064001

## 2022-05-16 MED ADMIN — HYDROCORTISONE SOD SUCC (PF) 100 MG/2 ML IJ SOLR [302326]: 50 mg | INTRAVENOUS | @ 22:00:00 | NDC 00009001103

## 2022-05-16 MED ADMIN — NOREPINEPHRINE BITARTRATE-NACL 4 MG/250 ML (16 MCG/ML) IV SOLN [173409]: 0.05 ug/kg/min | INTRAVENOUS | @ 09:00:00 | NDC 70092103305

## 2022-05-16 MED ADMIN — SODIUM CHLORIDE 0.9 % IV SOLP [27838]: 2 g | INTRAVENOUS | @ 14:00:00 | Stop: 2022-05-16 | NDC 00338004938

## 2022-05-16 MED ADMIN — HEPARIN (PORCINE) 10,000 UNIT/ML IJ SOLN (IMS) [211189]: 7500 [IU] | SUBCUTANEOUS | @ 11:00:00 | NDC 71288040401

## 2022-05-16 MED ADMIN — SODIUM CHLORIDE 0.9 % IV SOLP [27838]: INTRAVENOUS | @ 14:00:00 | NDC 00338004902

## 2022-05-16 MED ADMIN — SODIUM CHLORIDE 0.9 % IV SOLP [27838]: 2 g | INTRAVENOUS | @ 21:00:00 | NDC 00338004938

## 2022-05-16 MED ADMIN — CEFEPIME 2 GRAM IJ SOLR [78195]: 2 g | INTRAVENOUS | @ 05:00:00 | Stop: 2022-05-16 | NDC 60505614700

## 2022-05-16 MED ADMIN — NOREPINEPHRINE BITARTRATE-NACL 4 MG/250 ML (16 MCG/ML) IV SOLN [173409]: 0.16 ug/kg/min | INTRAVENOUS | @ 18:00:00 | NDC 44567064001

## 2022-05-16 MED ADMIN — ACETAMINOPHEN 500 MG PO TAB [102]: 1000 mg | ORAL | @ 14:00:00 | NDC 00904673061

## 2022-05-16 MED ADMIN — SODIUM CHLORIDE 0.9 % IV SOLP [27838]: 1250 mg | INTRAVENOUS | @ 18:00:00 | NDC 00338004902

## 2022-05-16 MED ADMIN — VANCOMYCIN 1.25 GRAM IV SOLR [338455]: 1250 mg | INTRAVENOUS | @ 18:00:00 | NDC 67457082312

## 2022-05-16 MED ADMIN — VANCOMYCIN 5 GRAM IV SOLR [8444]: 2500 mg | INTRAVENOUS | @ 06:00:00 | Stop: 2022-05-16 | NDC 71288002575

## 2022-05-16 MED ADMIN — CHOLECALCIFEROL (VITAMIN D3) 25 MCG (1,000 UNIT) PO TAB [130074]: 2000 [IU] | ORAL | @ 14:00:00 | NDC 80681016900

## 2022-05-16 MED ADMIN — IOPAMIDOL 370 MG IODINE /ML (76 %) IV SOLN [81928]: 100 mL | INTRAVENOUS | @ 05:00:00 | Stop: 2022-05-16 | NDC 00270131635

## 2022-05-16 MED ADMIN — ACETAMINOPHEN 500 MG PO TAB [102]: 1000 mg | ORAL | @ 07:00:00 | Stop: 2022-05-16 | NDC 00904673061

## 2022-05-16 MED ADMIN — CYANOCOBALAMIN (VITAMIN B-12) 500 MCG PO TAB [14673]: 1000 ug | ORAL | @ 14:00:00 | NDC 50268085411

## 2022-05-16 MED ADMIN — ACETAMINOPHEN 500 MG PO TAB [102]: 1000 mg | ORAL | @ 21:00:00 | NDC 00904673061

## 2022-05-16 MED ADMIN — MEROPENEM 1 GRAM IV SOLR [80713]: 2 g | INTRAVENOUS | @ 14:00:00 | Stop: 2022-05-16 | NDC 55150020830

## 2022-05-16 MED ADMIN — LIDOCAINE (PF) 10 MG/ML (1 %) IJ SOLN [95838]: 5 mL | INTRAMUSCULAR | @ 16:00:00 | Stop: 2022-05-16 | NDC 63323049209

## 2022-05-16 MED ADMIN — EZETIMIBE 10 MG PO TAB [86887]: 10 mg | ORAL | @ 14:00:00 | NDC 67877049090

## 2022-05-16 MED ADMIN — SODIUM CHLORIDE 0.9 % IV SOLP [27838]: 2500 mg | INTRAVENOUS | @ 06:00:00 | Stop: 2022-05-16 | NDC 00338004903

## 2022-05-16 MED ADMIN — NOREPINEPHRINE BITARTRATE-NACL 4 MG/250 ML (16 MCG/ML) IV SOLN [173409]: 0.21 ug/kg/min | INTRAVENOUS | @ 13:00:00 | NDC 44567064001

## 2022-05-16 MED ADMIN — SODIUM CHLORIDE 0.9 % IV PGBK (MB+) [95161]: 2 g | INTRAVENOUS | @ 05:00:00 | Stop: 2022-05-16 | NDC 00338915930

## 2022-05-16 MED ADMIN — LEVETIRACETAM 500 MG PO TAB [77943]: 500 mg | ORAL | @ 14:00:00 | NDC 00904712461

## 2022-05-16 MED ADMIN — LACTATED RINGERS IV SOLP [4318]: 500 mL | INTRAVENOUS | @ 13:00:00 | Stop: 2022-05-16 | NDC 00338011704

## 2022-05-16 MED ADMIN — HYDROCORTISONE SOD SUCC (PF) 100 MG/2 ML IJ SOLR [302326]: 50 mg | INTRAVENOUS | @ 16:00:00 | NDC 00009001103

## 2022-05-16 MED ADMIN — LACTATED RINGERS IV SOLP [4318]: 1000 mL | INTRAVENOUS | @ 06:00:00 | Stop: 2022-05-16 | NDC 00338011704

## 2022-05-16 MED ADMIN — METRONIDAZOLE IN NACL (ISO-OS) 500 MG/100 ML IV PGBK [5018]: 500 mg | INTRAVENOUS | @ 06:00:00 | Stop: 2022-05-16 | NDC 00409015201

## 2022-05-16 MED ADMIN — MEROPENEM 1 GRAM IV SOLR [80713]: 2 g | INTRAVENOUS | @ 21:00:00 | NDC 55150020830

## 2022-05-17 ENCOUNTER — Encounter: Admit: 2022-05-17 | Discharge: 2022-05-17 | Payer: MEDICARE

## 2022-05-17 MED ADMIN — MEROPENEM 1 GRAM IV SOLR [80713]: 2 g | INTRAVENOUS | @ 05:00:00 | NDC 55150020830

## 2022-05-17 MED ADMIN — SODIUM CHLORIDE 0.9 % IV SOLP [27838]: INTRAVENOUS | @ 07:00:00 | NDC 00338004902

## 2022-05-17 MED ADMIN — NOREPINEPHRINE BITARTRATE-NACL 4 MG/250 ML (16 MCG/ML) IV SOLN [173409]: 0.1 ug/kg/min | INTRAVENOUS | @ 03:00:00 | NDC 44567064001

## 2022-05-17 MED ADMIN — HEPARIN (PORCINE) 10,000 UNIT/ML IJ SOLN (IMS) [211189]: 7500 [IU] | SUBCUTANEOUS | @ 03:00:00 | NDC 63323054209

## 2022-05-17 MED ADMIN — CYANOCOBALAMIN (VITAMIN B-12) 500 MCG PO TAB [14673]: 1000 ug | ORAL | @ 13:00:00 | NDC 77333093725

## 2022-05-17 MED ADMIN — HYDROCORTISONE SOD SUCC (PF) 100 MG/2 ML IJ SOLR [302326]: 50 mg | INTRAVENOUS | @ 16:00:00 | NDC 00009001103

## 2022-05-17 MED ADMIN — LEVETIRACETAM 500 MG PO TAB [77943]: 500 mg | ORAL | @ 01:00:00 | NDC 00904712461

## 2022-05-17 MED ADMIN — SODIUM CHLORIDE 0.9 % IV SOLP [27838]: 2 g | INTRAVENOUS | @ 05:00:00 | NDC 00338004938

## 2022-05-17 MED ADMIN — VANCOMYCIN 1.25 GRAM IV SOLR [338455]: 1250 mg | INTRAVENOUS | @ 18:00:00 | NDC 67457082312

## 2022-05-17 MED ADMIN — ACETAMINOPHEN 500 MG PO TAB [102]: 1000 mg | ORAL | @ 11:00:00 | NDC 00904673061

## 2022-05-17 MED ADMIN — HEPARIN (PORCINE) 10,000 UNIT/ML IJ SOLN (IMS) [211189]: 7500 [IU] | SUBCUTANEOUS | @ 11:00:00 | NDC 63323054209

## 2022-05-17 MED ADMIN — LEVETIRACETAM 500 MG PO TAB [77943]: 500 mg | ORAL | @ 13:00:00 | NDC 00904712461

## 2022-05-17 MED ADMIN — SODIUM CHLORIDE 0.9 % IV SOLP [27838]: 2 g | INTRAVENOUS | @ 20:00:00 | NDC 00338004938

## 2022-05-17 MED ADMIN — CHOLECALCIFEROL (VITAMIN D3) 25 MCG (1,000 UNIT) PO TAB [130074]: 2000 [IU] | ORAL | @ 13:00:00 | NDC 80681016900

## 2022-05-17 MED ADMIN — MULTIVIT-IRON-FA-CALCIUM-MINS 9 MG IRON-400 MCG PO TAB [172795]: 1 | ORAL | @ 13:00:00 | NDC 40985022368

## 2022-05-17 MED ADMIN — SODIUM CHLORIDE 0.9 % IV SOLP [27838]: 1250 mg | INTRAVENOUS | @ 18:00:00 | NDC 00338004902

## 2022-05-17 MED ADMIN — MEROPENEM 1 GRAM IV SOLR [80713]: 2 g | INTRAVENOUS | @ 20:00:00 | NDC 55150020830

## 2022-05-17 MED ADMIN — HYDROCORTISONE SOD SUCC (PF) 100 MG/2 ML IJ SOLR [302326]: 50 mg | INTRAVENOUS | @ 05:00:00 | NDC 00009001103

## 2022-05-17 MED ADMIN — HEPARIN (PORCINE) 10,000 UNIT/ML IJ SOLN (IMS) [211189]: 7500 [IU] | SUBCUTANEOUS | @ 18:00:00 | NDC 63323054209

## 2022-05-17 MED ADMIN — MAGNESIUM SULFATE IN D5W 1 GRAM/100 ML IV PGBK [166578]: 1 g | INTRAVENOUS | @ 12:00:00 | Stop: 2022-05-17 | NDC 00264440054

## 2022-05-17 MED ADMIN — SODIUM CHLORIDE 0.9 % IV SOLP [27838]: 500 mL | INTRAVENOUS | @ 16:00:00 | Stop: 2022-05-17 | NDC 00338004904

## 2022-05-17 MED ADMIN — POTASSIUM, SODIUM PHOSPHATES 280-160-250 MG PO PWPK [166235]: 2 | ORAL | @ 11:00:00 | Stop: 2022-05-17 | NDC 71351001099

## 2022-05-17 MED ADMIN — ACETAMINOPHEN 500 MG PO TAB [102]: 1000 mg | ORAL | @ 03:00:00 | NDC 00904673061

## 2022-05-17 MED ADMIN — HYDROCORTISONE SOD SUCC (PF) 100 MG/2 ML IJ SOLR [302326]: 50 mg | INTRAVENOUS | @ 22:00:00 | NDC 00009001103

## 2022-05-17 MED ADMIN — MEROPENEM 1 GRAM IV SOLR [80713]: 2 g | INTRAVENOUS | @ 13:00:00 | NDC 55150020830

## 2022-05-17 MED ADMIN — MAGNESIUM SULFATE IN D5W 1 GRAM/100 ML IV PGBK [166578]: 1 g | INTRAVENOUS | @ 10:00:00 | Stop: 2022-05-17 | NDC 44567041024

## 2022-05-17 MED ADMIN — SODIUM CHLORIDE 0.9 % IV SOLP [27838]: 1250 mg | INTRAVENOUS | @ 07:00:00 | NDC 00338004902

## 2022-05-17 MED ADMIN — ROSUVASTATIN 20 MG PO TAB [88504]: 40 mg | ORAL | @ 01:00:00 | NDC 68462026390

## 2022-05-17 MED ADMIN — VANCOMYCIN 1.25 GRAM IV SOLR [338455]: 1250 mg | INTRAVENOUS | @ 07:00:00 | NDC 67457082312

## 2022-05-17 MED ADMIN — NOREPINEPHRINE BITARTRATE-NACL 4 MG/250 ML (16 MCG/ML) IV SOLN [173409]: 0.08 ug/kg/min | INTRAVENOUS | @ 09:00:00 | NDC 44567064001

## 2022-05-17 MED ADMIN — SODIUM CHLORIDE 0.9 % IV SOLP [27838]: 2 g | INTRAVENOUS | @ 13:00:00 | NDC 00338004938

## 2022-05-17 MED ADMIN — LACTATED RINGERS IV SOLP [4318]: 250 mL | INTRAVENOUS | @ 22:00:00 | Stop: 2022-05-17 | NDC 00338011704

## 2022-05-17 MED ADMIN — MAGNESIUM SULFATE IN D5W 1 GRAM/100 ML IV PGBK [166578]: 1 g | INTRAVENOUS | @ 13:00:00 | Stop: 2022-05-17 | NDC 00264440054

## 2022-05-17 MED ADMIN — NOREPINEPHRINE BITARTRATE-NACL 4 MG/250 ML (16 MCG/ML) IV SOLN [173409]: 0.05 ug/kg/min | INTRAVENOUS | @ 17:00:00 | NDC 44567064001

## 2022-05-17 MED ADMIN — EZETIMIBE 10 MG PO TAB [86887]: 10 mg | ORAL | @ 17:00:00 | NDC 67877049090

## 2022-05-17 MED ADMIN — HYDROCORTISONE SOD SUCC (PF) 100 MG/2 ML IJ SOLR [302326]: 50 mg | INTRAVENOUS | @ 11:00:00 | NDC 00009001103

## 2022-05-18 ENCOUNTER — Encounter: Admit: 2022-05-18 | Discharge: 2022-05-18 | Payer: MEDICARE

## 2022-05-18 MED ADMIN — HEPARIN (PORCINE) 10,000 UNIT/ML IJ SOLN (IMS) [211189]: 7500 [IU] | SUBCUTANEOUS | @ 19:00:00 | NDC 63323054209

## 2022-05-18 MED ADMIN — SODIUM CHLORIDE 0.9 % IV PGBK (MB+) [95161]: 4.5 g | INTRAVENOUS | @ 19:00:00 | NDC 00338915930

## 2022-05-18 MED ADMIN — MULTIVIT-IRON-FA-CALCIUM-MINS 9 MG IRON-400 MCG PO TAB [172795]: 1 | ORAL | @ 14:00:00 | NDC 40985022368

## 2022-05-18 MED ADMIN — MEROPENEM 1 GRAM IV SOLR [80713]: 2 g | INTRAVENOUS | @ 14:00:00 | Stop: 2022-05-18 | NDC 55150020830

## 2022-05-18 MED ADMIN — PIPERACILLIN-TAZOBACTAM 4.5 GRAM IV SOLR [80419]: 4.5 g | INTRAVENOUS | @ 19:00:00 | NDC 60505615900

## 2022-05-18 MED ADMIN — HYDROCORTISONE SOD SUCC (PF) 100 MG/2 ML IJ SOLR [302326]: 50 mg | INTRAVENOUS | @ 11:00:00 | Stop: 2022-05-18 | NDC 00009001103

## 2022-05-18 MED ADMIN — CYANOCOBALAMIN (VITAMIN B-12) 500 MCG PO TAB [14673]: 1000 ug | ORAL | @ 14:00:00 | NDC 77333093725

## 2022-05-18 MED ADMIN — HEPARIN (PORCINE) 10,000 UNIT/ML IJ SOLN (IMS) [211189]: 7500 [IU] | SUBCUTANEOUS | @ 04:00:00 | NDC 63323054209

## 2022-05-18 MED ADMIN — SODIUM CHLORIDE 0.9 % IV SOLP [27838]: 2 g | INTRAVENOUS | @ 05:00:00 | Stop: 2022-05-18 | NDC 00338004938

## 2022-05-18 MED ADMIN — HEPARIN (PORCINE) 10,000 UNIT/ML IJ SOLN (IMS) [211189]: 7500 [IU] | SUBCUTANEOUS | @ 11:00:00 | NDC 63323054209

## 2022-05-18 MED ADMIN — ROSUVASTATIN 20 MG PO TAB [88504]: 40 mg | ORAL | @ 01:00:00 | NDC 68462026390

## 2022-05-18 MED ADMIN — SODIUM CHLORIDE 0.9 % IV SOLP [27838]: 1250 mg | INTRAVENOUS | @ 07:00:00 | Stop: 2022-05-18 | NDC 00338004902

## 2022-05-18 MED ADMIN — CHOLECALCIFEROL (VITAMIN D3) 25 MCG (1,000 UNIT) PO TAB [130074]: 2000 [IU] | ORAL | @ 14:00:00 | NDC 80681016900

## 2022-05-18 MED ADMIN — LEVETIRACETAM 500 MG PO TAB [77943]: 500 mg | ORAL | @ 01:00:00 | NDC 00904712461

## 2022-05-18 MED ADMIN — HYDROCORTISONE SOD SUCC (PF) 100 MG/2 ML IJ SOLR [302326]: 50 mg | INTRAVENOUS | Stop: 2022-05-19 | NDC 00009001103

## 2022-05-18 MED ADMIN — MEROPENEM 1 GRAM IV SOLR [80713]: 2 g | INTRAVENOUS | @ 05:00:00 | Stop: 2022-05-18 | NDC 55150020830

## 2022-05-18 MED ADMIN — VANCOMYCIN 1.25 GRAM IV SOLR [338455]: 1250 mg | INTRAVENOUS | @ 07:00:00 | Stop: 2022-05-18 | NDC 67457082312

## 2022-05-18 MED ADMIN — ACETAMINOPHEN 500 MG PO TAB [102]: 1000 mg | ORAL | NDC 00904673061

## 2022-05-18 MED ADMIN — LEVETIRACETAM 500 MG PO TAB [77943]: 500 mg | ORAL | @ 14:00:00 | NDC 00904712461

## 2022-05-18 MED ADMIN — EZETIMIBE 10 MG PO TAB [86887]: 10 mg | ORAL | @ 14:00:00 | NDC 67877049090

## 2022-05-18 MED ADMIN — SODIUM CHLORIDE 0.9 % IV SOLP [27838]: 2 g | INTRAVENOUS | @ 14:00:00 | Stop: 2022-05-18 | NDC 00338004938

## 2022-05-18 MED ADMIN — LACTATED RINGERS IV SOLP [4318]: 500 mL | INTRAVENOUS | @ 05:00:00 | Stop: 2022-05-18 | NDC 00338011704

## 2022-05-18 MED ADMIN — ACETAMINOPHEN 500 MG PO TAB [102]: 1000 mg | ORAL | @ 02:00:00 | NDC 00904673061

## 2022-05-18 MED ADMIN — HYDROCORTISONE SOD SUCC (PF) 100 MG/2 ML IJ SOLR [302326]: 50 mg | INTRAVENOUS | @ 05:00:00 | Stop: 2022-05-18 | NDC 00009001103

## 2022-05-19 ENCOUNTER — Encounter: Admit: 2022-05-19 | Discharge: 2022-05-19 | Payer: MEDICARE

## 2022-05-19 MED ADMIN — HYDROCORTISONE SOD SUCC (PF) 100 MG/2 ML IJ SOLR [302326]: 50 mg | INTRAVENOUS | @ 12:00:00 | Stop: 2022-05-19 | NDC 00009001103

## 2022-05-19 MED ADMIN — HEPARIN (PORCINE) 10,000 UNIT/ML IJ SOLN (IMS) [211189]: 7500 [IU] | SUBCUTANEOUS | @ 12:00:00 | NDC 63323054209

## 2022-05-19 MED ADMIN — PIPERACILLIN-TAZOBACTAM 4.5 GRAM IV SOLR [80419]: 4.5 g | INTRAVENOUS | @ 02:00:00 | NDC 60505615900

## 2022-05-19 MED ADMIN — MULTIVIT-IRON-FA-CALCIUM-MINS 9 MG IRON-400 MCG PO TAB [172795]: 1 | ORAL | @ 13:00:00 | NDC 40985022368

## 2022-05-19 MED ADMIN — SODIUM CHLORIDE 0.9 % IV PGBK (MB+) [95161]: 4.5 g | INTRAVENOUS | @ 02:00:00 | NDC 00338915930

## 2022-05-19 MED ADMIN — PIPERACILLIN-TAZOBACTAM 4.5 GRAM IV SOLR [80419]: 4.5 g | INTRAVENOUS | @ 13:00:00 | NDC 60505615900

## 2022-05-19 MED ADMIN — SODIUM CHLORIDE 0.9 % IV PGBK (MB+) [95161]: 4.5 g | INTRAVENOUS | @ 13:00:00 | NDC 00338915930

## 2022-05-19 MED ADMIN — CHOLECALCIFEROL (VITAMIN D3) 25 MCG (1,000 UNIT) PO TAB [130074]: 2000 [IU] | ORAL | @ 13:00:00 | NDC 80681016900

## 2022-05-19 MED ADMIN — SODIUM CHLORIDE 0.9 % IV PGBK (MB+) [95161]: 4.5 g | INTRAVENOUS | @ 18:00:00 | NDC 00338915930

## 2022-05-19 MED ADMIN — INSULIN GLARGINE 100 UNIT/ML (3 ML) SC INJ PEN [163596]: 10 [IU] | SUBCUTANEOUS | @ 18:00:00 | NDC 00088221905

## 2022-05-19 MED ADMIN — PIPERACILLIN-TAZOBACTAM 4.5 GRAM IV SOLR [80419]: 4.5 g | INTRAVENOUS | @ 08:00:00 | NDC 60505615900

## 2022-05-19 MED ADMIN — ROSUVASTATIN 20 MG PO TAB [88504]: 40 mg | ORAL | @ 02:00:00 | NDC 68462026390

## 2022-05-19 MED ADMIN — INSULIN ASPART 100 UNIT/ML SC FLEXPEN [87504]: 6 [IU] | SUBCUTANEOUS | @ 03:00:00 | NDC 00169633910

## 2022-05-19 MED ADMIN — HEPARIN (PORCINE) 10,000 UNIT/ML IJ SOLN (IMS) [211189]: 7500 [IU] | SUBCUTANEOUS | @ 18:00:00 | NDC 63323054209

## 2022-05-19 MED ADMIN — HEPARIN (PORCINE) 10,000 UNIT/ML IJ SOLN (IMS) [211189]: 7500 [IU] | SUBCUTANEOUS | @ 03:00:00 | NDC 63323054209

## 2022-05-19 MED ADMIN — SODIUM CHLORIDE 0.9 % IV PGBK (MB+) [95161]: 4.5 g | INTRAVENOUS | @ 08:00:00 | NDC 00338915930

## 2022-05-19 MED ADMIN — LEVETIRACETAM 500 MG PO TAB [77943]: 500 mg | ORAL | @ 13:00:00 | NDC 00904712461

## 2022-05-19 MED ADMIN — LEVETIRACETAM 500 MG PO TAB [77943]: 500 mg | ORAL | @ 02:00:00 | NDC 00904712461

## 2022-05-19 MED ADMIN — EZETIMIBE 10 MG PO TAB [86887]: 10 mg | ORAL | @ 13:00:00 | NDC 67877049090

## 2022-05-19 MED ADMIN — PIPERACILLIN-TAZOBACTAM 4.5 GRAM IV SOLR [80419]: 4.5 g | INTRAVENOUS | @ 18:00:00 | NDC 60505615900

## 2022-05-19 MED ADMIN — CYANOCOBALAMIN (VITAMIN B-12) 500 MCG PO TAB [14673]: 1000 ug | ORAL | @ 13:00:00 | NDC 77333093725

## 2022-05-20 ENCOUNTER — Encounter: Admit: 2022-05-20 | Discharge: 2022-05-20 | Payer: MEDICARE

## 2022-05-20 MED ADMIN — HEPARIN (PORCINE) 10,000 UNIT/ML IJ SOLN (IMS) [211189]: 7500 [IU] | SUBCUTANEOUS | @ 03:00:00 | NDC 63323054209

## 2022-05-20 MED ADMIN — CHOLECALCIFEROL (VITAMIN D3) 25 MCG (1,000 UNIT) PO TAB [130074]: 2000 [IU] | ORAL | @ 13:00:00 | NDC 80681016900

## 2022-05-20 MED ADMIN — HEPARIN (PORCINE) 10,000 UNIT/ML IJ SOLN (IMS) [211189]: 7500 [IU] | SUBCUTANEOUS | @ 18:00:00 | NDC 63323054209

## 2022-05-20 MED ADMIN — SODIUM CHLORIDE 0.9 % IV PGBK (MB+) [95161]: 4.5 g | INTRAVENOUS | @ 02:00:00 | NDC 00338915930

## 2022-05-20 MED ADMIN — SODIUM CHLORIDE 0.9 % IV PGBK (MB+) [95161]: 4.5 g | INTRAVENOUS | @ 18:00:00 | NDC 00338915930

## 2022-05-20 MED ADMIN — SODIUM CHLORIDE 0.9 % IV PGBK (MB+) [95161]: 4.5 g | INTRAVENOUS | @ 07:00:00 | NDC 00338915930

## 2022-05-20 MED ADMIN — SODIUM CHLORIDE 0.9 % IV PGBK (MB+) [95161]: 4.5 g | INTRAVENOUS | @ 13:00:00 | NDC 00338915930

## 2022-05-20 MED ADMIN — ROSUVASTATIN 20 MG PO TAB [88504]: 40 mg | ORAL | @ 02:00:00 | NDC 68462026390

## 2022-05-20 MED ADMIN — LEVETIRACETAM 500 MG PO TAB [77943]: 500 mg | ORAL | @ 02:00:00 | NDC 00904712461

## 2022-05-20 MED ADMIN — PIPERACILLIN-TAZOBACTAM 4.5 GRAM IV SOLR [80419]: 4.5 g | INTRAVENOUS | @ 07:00:00 | NDC 60505615900

## 2022-05-20 MED ADMIN — PIPERACILLIN-TAZOBACTAM 4.5 GRAM IV SOLR [80419]: 4.5 g | INTRAVENOUS | @ 18:00:00 | NDC 60505615900

## 2022-05-20 MED ADMIN — CYANOCOBALAMIN (VITAMIN B-12) 500 MCG PO TAB [14673]: 1000 ug | ORAL | @ 13:00:00 | NDC 77333093725

## 2022-05-20 MED ADMIN — PIPERACILLIN-TAZOBACTAM 4.5 GRAM IV SOLR [80419]: 4.5 g | INTRAVENOUS | @ 02:00:00 | NDC 60505615900

## 2022-05-20 MED ADMIN — POTASSIUM CHLORIDE 20 MEQ PO TBTQ [35943]: 40 meq | ORAL | @ 18:00:00 | Stop: 2022-05-20 | NDC 00832532510

## 2022-05-20 MED ADMIN — PIPERACILLIN-TAZOBACTAM 4.5 GRAM IV SOLR [80419]: 4.5 g | INTRAVENOUS | @ 13:00:00 | NDC 60505615900

## 2022-05-20 MED ADMIN — MULTIVIT-IRON-FA-CALCIUM-MINS 9 MG IRON-400 MCG PO TAB [172795]: 1 | ORAL | @ 13:00:00 | NDC 40985022368

## 2022-05-20 MED ADMIN — MAGNESIUM OXIDE 400 MG (241.3 MG MAGNESIUM) PO TAB [10491]: 400 mg | ORAL | @ 18:00:00 | Stop: 2022-05-20 | NDC 10006070028

## 2022-05-20 MED ADMIN — HEPARIN (PORCINE) 10,000 UNIT/ML IJ SOLN (IMS) [211189]: 7500 [IU] | SUBCUTANEOUS | @ 11:00:00 | NDC 63323054209

## 2022-05-20 MED ADMIN — EZETIMIBE 10 MG PO TAB [86887]: 10 mg | ORAL | @ 13:00:00 | NDC 67877049090

## 2022-05-20 MED ADMIN — LEVETIRACETAM 500 MG PO TAB [77943]: 500 mg | ORAL | @ 13:00:00 | NDC 00904712461

## 2022-05-21 MED ADMIN — CYANOCOBALAMIN (VITAMIN B-12) 500 MCG PO TAB [14673]: 1000 ug | ORAL | @ 14:00:00 | NDC 50268085411

## 2022-05-21 MED ADMIN — SODIUM CHLORIDE 0.9 % IV PGBK (MB+) [95161]: 4.5 g | INTRAVENOUS | @ 14:00:00 | NDC 00338915930

## 2022-05-21 MED ADMIN — LEVETIRACETAM 500 MG PO TAB [77943]: 500 mg | ORAL | @ 13:00:00 | NDC 00904712461

## 2022-05-21 MED ADMIN — ROSUVASTATIN 10 MG PO TAB [88503]: 40 mg | ORAL | @ 02:00:00 | NDC 00904677961

## 2022-05-21 MED ADMIN — HEPARIN (PORCINE) 10,000 UNIT/ML IJ SOLN (IMS) [211189]: 7500 [IU] | SUBCUTANEOUS | @ 19:00:00 | NDC 63323054209

## 2022-05-21 MED ADMIN — SODIUM CHLORIDE 0.9 % IV PGBK (MB+) [95161]: 4.5 g | INTRAVENOUS | @ 19:00:00 | NDC 00338915930

## 2022-05-21 MED ADMIN — SODIUM CHLORIDE 0.9 % IV PGBK (MB+) [95161]: 4.5 g | INTRAVENOUS | @ 02:00:00 | NDC 00338915930

## 2022-05-21 MED ADMIN — CYANOCOBALAMIN (VITAMIN B-12) 500 MCG PO TAB [14673]: 1000 ug | ORAL | @ 14:00:00 | NDC 77333093725

## 2022-05-21 MED ADMIN — PIPERACILLIN-TAZOBACTAM 4.5 GRAM IV SOLR [80419]: 4.5 g | INTRAVENOUS | @ 02:00:00 | NDC 60505615900

## 2022-05-21 MED ADMIN — PIPERACILLIN-TAZOBACTAM 4.5 GRAM IV SOLR [80419]: 4.5 g | INTRAVENOUS | @ 19:00:00 | NDC 60505615900

## 2022-05-21 MED ADMIN — CHOLECALCIFEROL (VITAMIN D3) 25 MCG (1,000 UNIT) PO TAB [130074]: 2000 [IU] | ORAL | @ 13:00:00 | NDC 80681016900

## 2022-05-21 MED ADMIN — EZETIMIBE 10 MG PO TAB [86887]: 10 mg | ORAL | @ 14:00:00 | NDC 67877049090

## 2022-05-21 MED ADMIN — PIPERACILLIN-TAZOBACTAM 4.5 GRAM IV SOLR [80419]: 4.5 g | INTRAVENOUS | @ 14:00:00 | NDC 60505615900

## 2022-05-21 MED ADMIN — MULTIVIT-IRON-FA-CALCIUM-MINS 9 MG IRON-400 MCG PO TAB [172795]: 1 | ORAL | @ 14:00:00 | NDC 96295012782

## 2022-05-21 MED ADMIN — HEPARIN (PORCINE) 10,000 UNIT/ML IJ SOLN (IMS) [211189]: 7500 [IU] | SUBCUTANEOUS | @ 03:00:00 | NDC 63323054209

## 2022-05-21 MED ADMIN — LEVETIRACETAM 500 MG PO TAB [77943]: 500 mg | ORAL | @ 02:00:00 | NDC 00904712461

## 2022-05-21 MED ADMIN — HEPARIN (PORCINE) 10,000 UNIT/ML IJ SOLN (IMS) [211189]: 7500 [IU] | SUBCUTANEOUS | @ 14:00:00 | NDC 63323054209

## 2022-05-21 MED ADMIN — PIPERACILLIN-TAZOBACTAM 4.5 GRAM IV SOLR [80419]: 4.5 g | INTRAVENOUS | @ 07:00:00 | NDC 60505615900

## 2022-05-21 MED ADMIN — SODIUM CHLORIDE 0.9 % IV PGBK (MB+) [95161]: 4.5 g | INTRAVENOUS | @ 07:00:00 | NDC 00338915930

## 2022-05-22 MED ADMIN — LEVETIRACETAM 500 MG PO TAB [77943]: 500 mg | ORAL | @ 01:00:00 | NDC 00904712461

## 2022-05-22 MED ADMIN — ROSUVASTATIN 10 MG PO TAB [88503]: 40 mg | ORAL | @ 01:00:00 | NDC 00904677961

## 2022-05-22 MED ADMIN — SODIUM CHLORIDE 0.9 % IV PGBK (MB+) [95161]: 4.5 g | INTRAVENOUS | @ 01:00:00 | NDC 00338915930

## 2022-05-22 MED ADMIN — HEPARIN (PORCINE) 10,000 UNIT/ML IJ SOLN (IMS) [211189]: 7500 [IU] | SUBCUTANEOUS | @ 11:00:00 | NDC 63323054209

## 2022-05-22 MED ADMIN — CHOLECALCIFEROL (VITAMIN D3) 25 MCG (1,000 UNIT) PO TAB [130074]: 2000 [IU] | ORAL | @ 13:00:00 | NDC 80681016900

## 2022-05-22 MED ADMIN — PIPERACILLIN-TAZOBACTAM 4.5 GRAM IV SOLR [80419]: 4.5 g | INTRAVENOUS | @ 19:00:00 | NDC 60505615900

## 2022-05-22 MED ADMIN — LEVETIRACETAM 500 MG PO TAB [77943]: 500 mg | ORAL | @ 13:00:00 | NDC 00904712461

## 2022-05-22 MED ADMIN — EZETIMIBE 10 MG PO TAB [86887]: 10 mg | ORAL | @ 13:00:00 | NDC 67877049090

## 2022-05-22 MED ADMIN — PIPERACILLIN-TAZOBACTAM 4.5 GRAM IV SOLR [80419]: 4.5 g | INTRAVENOUS | @ 07:00:00 | NDC 60505615900

## 2022-05-22 MED ADMIN — SODIUM CHLORIDE 0.9 % IV PGBK (MB+) [95161]: 4.5 g | INTRAVENOUS | @ 13:00:00 | NDC 00338915930

## 2022-05-22 MED ADMIN — HEPARIN (PORCINE) 10,000 UNIT/ML IJ SOLN (IMS) [211189]: 7500 [IU] | SUBCUTANEOUS | @ 19:00:00 | NDC 63323054209

## 2022-05-22 MED ADMIN — MULTIVIT-IRON-FA-CALCIUM-MINS 9 MG IRON-400 MCG PO TAB [172795]: 1 | ORAL | @ 13:00:00 | NDC 00904549261

## 2022-05-22 MED ADMIN — SODIUM CHLORIDE 0.9 % IV PGBK (MB+) [95161]: 4.5 g | INTRAVENOUS | @ 07:00:00 | NDC 00338915930

## 2022-05-22 MED ADMIN — SODIUM CHLORIDE 0.9 % IV PGBK (MB+) [95161]: 4.5 g | INTRAVENOUS | @ 19:00:00 | NDC 00338915930

## 2022-05-22 MED ADMIN — HEPARIN (PORCINE) 10,000 UNIT/ML IJ SOLN (IMS) [211189]: 7500 [IU] | SUBCUTANEOUS | @ 03:00:00 | NDC 63323054209

## 2022-05-22 MED ADMIN — CYANOCOBALAMIN (VITAMIN B-12) 500 MCG PO TAB [14673]: 1000 ug | ORAL | @ 13:00:00 | NDC 50268085411

## 2022-05-22 MED ADMIN — PIPERACILLIN-TAZOBACTAM 4.5 GRAM IV SOLR [80419]: 4.5 g | INTRAVENOUS | @ 01:00:00 | NDC 60505615900

## 2022-05-22 MED ADMIN — PIPERACILLIN-TAZOBACTAM 4.5 GRAM IV SOLR [80419]: 4.5 g | INTRAVENOUS | @ 13:00:00 | NDC 60505615900

## 2022-05-23 ENCOUNTER — Encounter: Admit: 2022-05-23 | Discharge: 2022-05-23 | Payer: MEDICARE

## 2022-05-23 MED ADMIN — HEPARIN (PORCINE) 10,000 UNIT/ML IJ SOLN (IMS) [211189]: 7500 [IU] | SUBCUTANEOUS | @ 03:00:00 | NDC 63323054209

## 2022-05-23 MED ADMIN — PIPERACILLIN-TAZOBACTAM 4.5 GRAM IV SOLR [80419]: 4.5 g | INTRAVENOUS | @ 13:00:00 | Stop: 2022-05-23 | NDC 60505615900

## 2022-05-23 MED ADMIN — PIPERACILLIN-TAZOBACTAM 4.5 GRAM IV SOLR [80419]: 4.5 g | INTRAVENOUS | @ 08:00:00 | Stop: 2022-05-23 | NDC 60505615900

## 2022-05-23 MED ADMIN — ROSUVASTATIN 10 MG PO TAB [88503]: 40 mg | ORAL | @ 02:00:00 | NDC 00904677961

## 2022-05-23 MED ADMIN — HEPARIN (PORCINE) 10,000 UNIT/ML IJ SOLN (IMS) [211189]: 7500 [IU] | SUBCUTANEOUS | @ 12:00:00 | Stop: 2022-05-23 | NDC 63323054209

## 2022-05-23 MED ADMIN — MULTIVIT-IRON-FA-CALCIUM-MINS 9 MG IRON-400 MCG PO TAB [172795]: 1 | ORAL | @ 13:00:00 | Stop: 2022-05-23 | NDC 96295012782

## 2022-05-23 MED ADMIN — CYANOCOBALAMIN (VITAMIN B-12) 500 MCG PO TAB [14673]: 1000 ug | ORAL | @ 13:00:00 | Stop: 2022-05-23 | NDC 50268085411

## 2022-05-23 MED ADMIN — SODIUM CHLORIDE 0.9 % IV PGBK (MB+) [95161]: 4.5 g | INTRAVENOUS | @ 13:00:00 | Stop: 2022-05-23 | NDC 00338915930

## 2022-05-23 MED ADMIN — CARVEDILOL 3.125 MG PO TAB [79317]: 3.125 mg | ORAL | @ 13:00:00 | Stop: 2022-05-23 | NDC 00904630061

## 2022-05-23 MED ADMIN — SODIUM CHLORIDE 0.9 % IV PGBK (MB+) [95161]: 4.5 g | INTRAVENOUS | @ 02:00:00 | NDC 00338915930

## 2022-05-23 MED ADMIN — EZETIMIBE 10 MG PO TAB [86887]: 10 mg | ORAL | @ 13:00:00 | Stop: 2022-05-23 | NDC 67877049090

## 2022-05-23 MED ADMIN — PIPERACILLIN-TAZOBACTAM 4.5 GRAM IV SOLR [80419]: 4.5 g | INTRAVENOUS | @ 02:00:00 | NDC 60505615900

## 2022-05-23 MED ADMIN — LEVETIRACETAM 500 MG PO TAB [77943]: 500 mg | ORAL | @ 02:00:00 | NDC 00904712461

## 2022-05-23 MED ADMIN — LEVETIRACETAM 500 MG PO TAB [77943]: 500 mg | ORAL | @ 13:00:00 | Stop: 2022-05-23 | NDC 00904712461

## 2022-05-23 MED ADMIN — CHOLECALCIFEROL (VITAMIN D3) 25 MCG (1,000 UNIT) PO TAB [130074]: 2000 [IU] | ORAL | @ 13:00:00 | Stop: 2022-05-23 | NDC 80681016900

## 2022-05-23 MED ADMIN — SODIUM CHLORIDE 0.9 % IV PGBK (MB+) [95161]: 4.5 g | INTRAVENOUS | @ 08:00:00 | Stop: 2022-05-23 | NDC 00338915930

## 2022-05-23 MED FILL — CARVEDILOL 3.125 MG PO TAB: 3.125 mg | ORAL | 90 days supply | Qty: 180 | Fill #1 | Status: CP

## 2022-05-24 ENCOUNTER — Encounter: Admit: 2022-05-24 | Discharge: 2022-05-24 | Payer: MEDICARE

## 2022-05-24 NOTE — Telephone Encounter
Hospital Discharge Follow Up      Reached Patient: Yes, patient was identified, for their safety, using dual identification of name and date of birth  Patient Date of Birth: May 24, 1990     Admission Information:     H[facility]  Admission Date: 05/15/2022  Discharge Date: 05/23/2022    Admission Diagnosis: Severe Sepsis  Discharge Diagnosis: Severe Sepsis  Has there been a discharge within the last 30 days? No   Hospital Services: Unplanned  Today's call is 1 (business) days post discharge      Discharge Instruction Review   Did patient receive and understand discharge instructions? Yes    Home Health ordered? Yes, Agency name/telephone number:   Has Home Health agency contacted patient? Yes   Caregiver assistance in the home? Yes   Are there concerns regarding the patient's ADL'S? No  Is patient a fall risk? Yes    Special diet? Diabetic      Medication Reconciliation    Changes to pre-hospital medications? Yes    CHANGE how you take:  carvediloL (COREG)  STOP taking:  losartan 100 mg tablet (COZAAR)  spironolactone 25 mg tablet (ALDACTONE)    Were new prescriptions filled? Yes  Meds reviewed and reconciled? Yes    Current Outpatient Medications   Medication Instructions    acetaminophen (TYLENOL EXTRA STRENGTH) 1,000 mg, Oral, EVERY  6 HOURS PRN, Max of 4,000 mg of acetaminophen in 24 hours.    blood sugar diagnostic (ONETOUCH ULTRA TEST) test strip Use to check blood sugars daily. E11.65    blood-glucose meter (ACCU-CHEK GUIDE GLUCOSE METER) kit 1 strip, Test, DAILY, Diagnosis Code: DM-2    carvediloL (COREG) 3.125 mg, Oral, TWICE DAILY, Take with food.    CHOLEcalciferoL (vitamin D3) 2,000 Units, Oral, DAILY    cyanocobalamin (vitamin B-12) 3,000 mcg cap 1 capsule, Oral, DAILY    ezetimibe (ZETIA) 10 mg, Oral, DAILY    furosemide (LASIX) 20 mg, Oral, DAILY  PRN    icosapent ethyL (VASCEPA) 2 g, Oral, TWICE DAILY WITH MEALS    lancets 33 gauge 33 gauge Use to check blood sugars daily E11.65    Leg Brace misc Bilateral Fitted leg brace and right shoe for foot drop.  Dx: spina bifida.    levETIRAcetam (KEPPRA) 500 mg, Oral, TWICE DAILY    metFORMIN-XR (GLUCOPHAGE XR) 500 mg extended release tablet Take one tablet by mouth daily with breakfast AND two tablets daily with dinner.    Miscellaneous Medical Supply misc Shoe lift for right foot.  Dx: spina bifida    MOUNJARO 10 mg, Subcutaneous, EVERY  7 DAYS    multivit-min/folic/vit K/lycop (ONE-A-DAY MEN'S MULTIVITAMIN PO) 1 tablet, Oral, AT BEDTIME DAILY    Ostomy Supplies 1  misc Hollister 1 piece Urostomy bag.  Use 1 bag as needed.    psyllium seed (with sugar) (FIBER PO) 1 tablet, Oral, TWICE DAILY    rosuvastatin (CRESTOR) 40 mg, Oral, AT BEDTIME DAILY    sennosides-docusate sodium (SENOKOT-S) 8.6/50 mg tablet 1 tablet, Oral, TWICE DAILY    triamcinolone acetonide (TRIDERM) 0.1 % topical cream Topical, TWICE DAILY         Understanding Condition   Having any current symptoms? Patient having diarrhea, possibly related to antibiotic. Taking immodium. Staying hydrated.    Do you have a history of Heart Failure? Yes, What time do you weigh yourself daily? morning  Have you experienced weight gain since you were discharged from the hospital? No  Have you had an increase in swelling since discharge?No    Have you had an increase in shortness of air since discharge? No  Have you had an increase in fatigue since discharge? No  Have you had an increase in abdominal bloating/tightness since discharge? No  Do you have a zone sheet? Yes     Patient understands when to seek additional medical care? Yes   Other items discussed: Report shortness of breath, fever, uncontrolled pain.       Scheduling Follow-up Appointment   Upcoming appointments:   Future Appointments   Date Time Provider Department Center   05/25/2022  1:30 PM Marylen Ponto, MD [id number] [place] [facility]   06/01/2022  3:00 PM Hiram Gash, MD [email or web address]   06/14/2022 1:00 PM Reece Leader, [id number]   10/20/2022  2:00 PM Hiram Gash, MD [email or web address]   10/21/2022 10:30 AM Maia Petties, MD [id number] Neurology     Does the patient require 7 day follow up appointment? Yes   Hospital Follow-Up scheduled with PCP? Yes, Date: 06/01/2022 and has appt with surgeon w/in 7 days   When was patient?s last PCP visit: 04/27/2022   PCP primary location: [facility]  Specialist appointment scheduled? No  Is assistance with transportation needed? No   MyChart message sent? Active in MyChart. No message sent.   Artera text sent? No    Jeanene Erb, RN

## 2022-05-25 ENCOUNTER — Encounter: Admit: 2022-05-25 | Discharge: 2022-05-25 | Payer: MEDICARE

## 2022-05-25 DIAGNOSIS — N319 Neuromuscular dysfunction of bladder, unspecified: Secondary | ICD-10-CM

## 2022-05-25 DIAGNOSIS — Q059 Spina bifida, unspecified: Secondary | ICD-10-CM

## 2022-05-25 DIAGNOSIS — N200 Calculus of kidney: Secondary | ICD-10-CM

## 2022-05-25 DIAGNOSIS — I10 Essential (primary) hypertension: Secondary | ICD-10-CM

## 2022-05-25 DIAGNOSIS — N1330 Unspecified hydronephrosis: Secondary | ICD-10-CM

## 2022-05-25 DIAGNOSIS — T85618A Breakdown (mechanical) of other specified internal prosthetic devices, implants and grafts, initial encounter: Secondary | ICD-10-CM

## 2022-05-25 DIAGNOSIS — Q0700 Arnold-Chiari syndrome without spina bifida or hydrocephalus: Secondary | ICD-10-CM

## 2022-05-25 DIAGNOSIS — G4733 Obstructive sleep apnea (adult) (pediatric): Secondary | ICD-10-CM

## 2022-05-25 DIAGNOSIS — R51 Headache: Secondary | ICD-10-CM

## 2022-05-25 DIAGNOSIS — F988 Other specified behavioral and emotional disorders with onset usually occurring in childhood and adolescence: Secondary | ICD-10-CM

## 2022-05-25 DIAGNOSIS — Z01810 Encounter for preprocedural cardiovascular examination: Secondary | ICD-10-CM

## 2022-05-25 DIAGNOSIS — R339 Retention of urine, unspecified: Secondary | ICD-10-CM

## 2022-05-25 DIAGNOSIS — R000 Tachycardia, unspecified: Secondary | ICD-10-CM

## 2022-05-25 DIAGNOSIS — G919 Hydrocephalus, unspecified: Secondary | ICD-10-CM

## 2022-05-25 DIAGNOSIS — I429 Cardiomyopathy, unspecified: Secondary | ICD-10-CM

## 2022-05-25 DIAGNOSIS — Z936 Other artificial openings of urinary tract status: Secondary | ICD-10-CM

## 2022-05-25 DIAGNOSIS — N390 Urinary tract infection, site not specified: Secondary | ICD-10-CM

## 2022-05-25 DIAGNOSIS — R569 Unspecified convulsions: Secondary | ICD-10-CM

## 2022-05-25 NOTE — Progress Notes
Staples removed. Patient was lightheaded after staple removal. Provided patient with water and a snack. Vitals stable. Per patient, he only ate some yogurt today. He felt better after eating his snack. Incision dry and intact. No drainage noted. Instructed on home care and to call us back if he experienced any recurrent symptoms. Patient taken up front to scheduling.

## 2022-05-25 NOTE — Progress Notes
Date of Service: 05/25/2022    Subjective:             Allen Gill is a 32 y.o. male.    History of Present Illness  32 year old male status post bilateral ureteral reimplant for right ureteroenteric anastomotic stricture presents for follow-up.  Unfortunately patient was admitted with left-sided pyelonephritis after presenting with left flank pain and fevers.  He also had low blood pressure while in the hospital and they decreased his carvedilol and stopped his losartan and his spironolactone.  He returns today reporting that he has had right-sided flank pain for approximately 1 hour but otherwise has been pain-free.  He has not had any abdominal pain.  He does endorse some lightheadedness recently including today when he was walking to the exam room.           Review of Systems      Objective:          acetaminophen (TYLENOL EXTRA STRENGTH) 500 mg tablet Take two tablets by mouth every 6 hours as needed for Pain. Max of 4,000 mg of acetaminophen in 24 hours.  Indications: pain    blood sugar diagnostic (ONETOUCH ULTRA TEST) test strip Use to check blood sugars daily. E11.65    blood-glucose meter (ACCU-CHEK GUIDE GLUCOSE METER) kit Use one strip as directed daily. Diagnosis Code: DM-2    carvediloL (COREG) 3.125 mg tablet Take one tablet by mouth twice daily. Take with food.  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    cyanocobalamin (vitamin B-12) 3,000 mcg cap Take 1 capsule by mouth daily.    ezetimibe (ZETIA) 10 mg tablet Take one tablet by mouth daily.    furosemide (LASIX) 20 mg tablet Take one tablet by mouth daily as needed.    icosapent ethyL (VASCEPA) 1 gram capsule Take two capsules by mouth twice daily with meals.    lancets 33 gauge 33 gauge Use to check blood sugars daily E11.65    Leg Brace misc Bilateral Fitted leg brace and right shoe for foot drop.  Dx: spina bifida.    levETIRAcetam (KEPPRA) 500 mg tablet Take one tablet by mouth twice daily.    loperamide (IMODIUM A-D) 2 mg capsule Take one capsule by mouth as Needed for Diarrhea.    metFORMIN-XR (GLUCOPHAGE XR) 500 mg extended release tablet Take one tablet by mouth daily with breakfast AND two tablets daily with dinner.    Miscellaneous Medical Supply misc Shoe lift for right foot.  Dx: spina bifida    multivit-min/folic/vit K/lycop (ONE-A-DAY MEN'S MULTIVITAMIN PO) Take 1 tablet by mouth at bedtime daily.    Ostomy Supplies 1  misc Hollister 1 piece Urostomy bag.  Use 1 bag as needed.    psyllium seed (with sugar) (FIBER PO) Take 1 tablet by mouth twice daily.    rosuvastatin (CRESTOR) 40 mg tablet Take one tablet by mouth at bedtime daily.    sennosides-docusate sodium (SENOKOT-S) 8.6/50 mg tablet Take one tablet by mouth twice daily. Indications: constipation    tirzepatide (MOUNJARO) 10 mg/0.5 mL injector PEN Inject 0.5 mL under the skin every 7 days.    triamcinolone acetonide (TRIDERM) 0.1 % topical cream Apply  topically to affected area twice daily. (Patient taking differently: Apply  topically to affected area as Needed.)     Vitals:    05/25/22 1331 05/25/22 1413   BP: 106/61 112/70  BP Source: Arm, Right Upper Arm, Right Upper   Pulse: 103 111   Temp: 36.3 ?C (97.4 ?F)    Resp: 18    SpO2: 95% 94%   TempSrc: Temporal    PainSc: Six    Weight: 120.2 kg (265 lb)      Body mass index is 48.47 kg/m?Marland Kitchen     Physical Exam  Wound healing well with staples  Right upper quadrant urostomy with bilateral stents, bilateral stents removed, inspected, and intact.       Assessment and Plan:  32 year old male status post bilateral ureteral reimplant for right ureteroenteric anastomotic stricture  Continue stent removal prophylactic antibiotics  Continue to hold losartan and spironolactone.  Patient has follow-up with his primary care physician in 1 week and he is checking his blood pressure regularly at home  Return to clinic in 3 months with labs

## 2022-05-27 ENCOUNTER — Encounter: Admit: 2022-05-27 | Discharge: 2022-05-27 | Payer: MEDICARE

## 2022-05-27 MED ORDER — LEG BRACE MISC MISC
0 refills | Status: AC
Start: 2022-05-27 — End: ?

## 2022-05-27 NOTE — Telephone Encounter
Received call from Tiffany with [facility]. Requesting PT/OT verbal orders.    Routed to Dr. Larina Bras.

## 2022-05-27 NOTE — Telephone Encounter
Called and gave verbal order for PT/OT to Tiffany.

## 2022-05-28 ENCOUNTER — Encounter: Admit: 2022-05-28 | Discharge: 2022-05-28 | Payer: MEDICARE

## 2022-05-28 NOTE — Telephone Encounter
Calling for authorization for 3 HH visits? Ok to LM on secure VM.  Would like to work on self care, fall prevention & safety.     Dr Larina Bras ok?

## 2022-05-28 NOTE — Telephone Encounter
Hiram Gash, MD  You2 minutes ago (10:48 AM)       yes

## 2022-05-28 NOTE — Telephone Encounter
LM on VM per Dr Larina Bras ok with plan for 3 visits. Call back if needs anything from office.

## 2022-05-31 ENCOUNTER — Encounter: Admit: 2022-05-31 | Discharge: 2022-05-31 | Payer: MEDICARE

## 2022-05-31 MED FILL — ONETOUCH ULTRA TEST MISC STRP: 50 days supply | Qty: 50 | Fill #4 | Status: CP

## 2022-06-01 ENCOUNTER — Encounter: Admit: 2022-06-01 | Discharge: 2022-06-01 | Payer: MEDICARE

## 2022-06-01 ENCOUNTER — Ambulatory Visit: Admit: 2022-06-01 | Discharge: 2022-06-02 | Payer: MEDICARE

## 2022-06-01 DIAGNOSIS — G4733 Obstructive sleep apnea (adult) (pediatric): Secondary | ICD-10-CM

## 2022-06-01 DIAGNOSIS — R569 Unspecified convulsions: Secondary | ICD-10-CM

## 2022-06-01 DIAGNOSIS — G919 Hydrocephalus, unspecified: Secondary | ICD-10-CM

## 2022-06-01 DIAGNOSIS — T85618A Breakdown (mechanical) of other specified internal prosthetic devices, implants and grafts, initial encounter: Secondary | ICD-10-CM

## 2022-06-01 DIAGNOSIS — I429 Cardiomyopathy, unspecified: Secondary | ICD-10-CM

## 2022-06-01 DIAGNOSIS — I10 Essential (primary) hypertension: Secondary | ICD-10-CM

## 2022-06-01 DIAGNOSIS — Z09 Encounter for follow-up examination after completed treatment for conditions other than malignant neoplasm: Secondary | ICD-10-CM

## 2022-06-01 DIAGNOSIS — F988 Other specified behavioral and emotional disorders with onset usually occurring in childhood and adolescence: Secondary | ICD-10-CM

## 2022-06-01 DIAGNOSIS — N200 Calculus of kidney: Secondary | ICD-10-CM

## 2022-06-01 DIAGNOSIS — N319 Neuromuscular dysfunction of bladder, unspecified: Secondary | ICD-10-CM

## 2022-06-01 DIAGNOSIS — R51 Headache: Secondary | ICD-10-CM

## 2022-06-01 DIAGNOSIS — N390 Urinary tract infection, site not specified: Secondary | ICD-10-CM

## 2022-06-01 DIAGNOSIS — Q059 Spina bifida, unspecified: Secondary | ICD-10-CM

## 2022-06-01 DIAGNOSIS — R000 Tachycardia, unspecified: Secondary | ICD-10-CM

## 2022-06-01 DIAGNOSIS — R339 Retention of urine, unspecified: Secondary | ICD-10-CM

## 2022-06-01 DIAGNOSIS — Z01810 Encounter for preprocedural cardiovascular examination: Secondary | ICD-10-CM

## 2022-06-01 DIAGNOSIS — Q0700 Arnold-Chiari syndrome without spina bifida or hydrocephalus: Secondary | ICD-10-CM

## 2022-06-01 MED ORDER — LOSARTAN 50 MG PO TAB
50 mg | ORAL_TABLET | Freq: Every day | ORAL | 3 refills | 90.00000 days | Status: AC
Start: 2022-06-01 — End: ?

## 2022-06-01 MED ORDER — FERROUS SULFATE 142 MG (45 MG IRON) PO TBER
1 | ORAL_TABLET | Freq: Every day | ORAL | 1 refills | Status: AC
Start: 2022-06-01 — End: ?

## 2022-06-01 NOTE — Progress Notes
Chief Complaint   Patient presents with    Post-hospital Follow Up     At [place]  BP low-          Subjective:       Patient Reported Other  What topic(s) would you like to cover during your appointment?:  Restarting medicine  Please describe the issue(s) and history with the issue (location, severity, duration, symptoms, etc.).:  Not applicable  What has been done so far to take care of the issue(s)?:  Talked over MyChart  What are your goals for this visit?:  Restart medicine    Allen Gill is a 32 y.o. male.  Patient presents to clinic for hospital discharge follow-up.  He states overall now he is feeling better.  He has been monitoring his glucose and his blood pressures.  Patient has not started his losartan and is only taking a half dose of carvedilol at 3.125 mg twice daily.  He also had some weight loss.  Patient was admitted with severe sepsis to the ICU requiring pressors.  He now feels like he is back to his baseline.  He does not feel ill.  Patient denies any chest pain or fevers.          Review of Systems   Constitutional: Negative for fever and malaise/fatigue.   Cardiovascular:  Negative for chest pain and dyspnea on exertion.   Respiratory:  Negative for shortness of breath.    Gastrointestinal:  Negative for abdominal pain.          Objective:          acetaminophen (TYLENOL EXTRA STRENGTH) 500 mg tablet Take two tablets by mouth every 6 hours as needed for Pain. Max of 4,000 mg of acetaminophen in 24 hours.  Indications: pain    blood sugar diagnostic (ONETOUCH ULTRA TEST) test strip Use to check blood sugars daily. E11.65    blood-glucose meter (ACCU-CHEK GUIDE GLUCOSE METER) kit Use one strip as directed daily. Diagnosis Code: DM-2    carvediloL (COREG) 3.125 mg tablet Take one tablet by mouth twice daily. Take with food.  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 cyanocobalamin (vitamin B-12) 3,000 mcg cap Take 1 capsule by mouth daily.    ezetimibe (ZETIA) 10 mg tablet Take one tablet by mouth daily.    ferrous sulfate (SLOW RELEASE IRON) 142 mg (45 mg iron) ER tablet Take one tablet by mouth daily.    furosemide (LASIX) 20 mg tablet Take one tablet by mouth daily as needed.    icosapent ethyL (VASCEPA) 1 gram capsule Take two capsules by mouth twice daily with meals.    lancets 33 gauge 33 gauge Use to check blood sugars daily E11.65    Leg Brace misc Bilateral Fitted leg brace and right shoe for foot drop.  Dx: spina bifida.    levETIRAcetam (KEPPRA) 500 mg tablet Take one tablet by mouth twice daily.    loperamide (IMODIUM A-D) 2 mg capsule Take one capsule by mouth as Needed for Diarrhea.    losartan (COZAAR) 50 mg tablet Take one tablet by mouth daily.    metFORMIN-XR (GLUCOPHAGE XR) 500 mg extended release tablet Take one tablet by mouth daily with breakfast AND two tablets daily with dinner.    Miscellaneous Medical Supply misc Shoe lift for right foot.  Dx: spina bifida    multivit-min/folic/vit K/lycop (ONE-A-DAY MEN'S MULTIVITAMIN  PO) Take 1 tablet by mouth at bedtime daily.    Ostomy Supplies 1  misc Hollister 1 piece Urostomy bag.  Use 1 bag as needed.    psyllium seed (with sugar) (FIBER PO) Take 1 tablet by mouth twice daily.    rosuvastatin (CRESTOR) 40 mg tablet Take one tablet by mouth at bedtime daily.    sennosides-docusate sodium (SENOKOT-S) 8.6/50 mg tablet Take one tablet by mouth twice daily. Indications: constipation    tirzepatide (MOUNJARO) 10 mg/0.5 mL injector PEN Inject 0.5 mL under the skin every 7 days.    triamcinolone acetonide (TRIDERM) 0.1 % topical cream Apply  topically to affected area twice daily. (Patient taking differently: Apply  topically to affected area as Needed.)     Vitals:    06/01/22 1437   BP: 118/72   BP Source: Arm, Left Upper   Pulse: 100   Temp: 36.6 ?C (97.9 ?F)   SpO2: 95%   TempSrc: Temporal   PainSc: Zero Weight: 118.6 kg (261 lb 6.4 oz)     Body mass index is 47.81 kg/m?Marland Kitchen       Physical Exam  General: NAD, A& O x3  CV: RRR no murmur  Pulm: CTA BIL  Psych: Normal mood and affect    Reviewed hospital stay, discharge summary, TOC note, DC med list       Assessment and Plan:  1. Hospital discharge follow-up  Patient admitted to [facility] May 4 to May 12 to ICU with severe sepsis.  He had recently had a urostomy procedure and it was thought that his section was secondary to this.  He completed broad-spectrum antibiotics.  Reviewed discharge summary, hospital stay, TOC note and DC med list.  Patient is feeling much better.  He has kept a log of his blood sugars and blood pressures which have been controlled.    2. Essential hypertension  Patient has stopped spironolactone.  Will start losartan at lower dose of 50 mg daily and monitor.  Continue carvedilol 3.125 mg twice daily.  Hold on spironolactone for now.  Return to clinic in 3 months.  Patient to reach out if his blood pressure is elevated.    3.  Iron deficiency anemia  Patient had stopped taking iron daily.  Recommend he resume this.  Recheck hemoglobin and iron studies in 3 months.                   Monitored today and my assessment is that the current treatment is appropriate.       At least 50% of visit was spent in counseling and coordination of care.    I reviewed with the patient their current medications and specifically any new medications prescribed at the time of this visit and we reviewed the expected benefits and potential side effects. All questions are answered to the patient's satisfaction.

## 2022-06-14 ENCOUNTER — Ambulatory Visit: Admit: 2022-06-14 | Discharge: 2022-06-14 | Payer: MEDICARE

## 2022-06-14 NOTE — Progress Notes
A follow-up comprehensive medication management visit was completed today via telephone.    Referral reason: Diabetes Management  Referring provider: Orson Gear, MD    Assessment & Plan     Diabetes  Patient's diabetes is uncontrolled as reflected by home blood sugars and A1C. Their goal glucose is pre-prandial 80-130 and post-prandial <180. Their most recent A1C was 8.3% on 04/27/2022 with a goal of < 7%    Has not switched to [facility] yet. Will continue monitoring for now and follow up after patient has had a chance to take a few doses of Mounjaro 10 mg to determine if any adjustments is needed.    Plan  - SWITCH to Mounjaro 10 mg once a week on Tuesdays starting tomorrow 6/4  - Continue metformin XR 500 mg AM and 1000 mg PM    Hypertension  Patient's blood pressure is controlled per home readings. Their goal blood pressure is < 130/80 mmHg    Patient reported having lower blood pressure readings despite being off carvedilol. Recommended patient stay off carvedilol for now, continue monitoring his blood pressures 1-2 times daily, and then contact his cardiologist's office for review.       Follow-up  The patient will continue to follow up with the pharmacist. Return to pharmacist in 3 weeks via telephone. The return visit was scheduled during today's visit.    Subjective & Objective   At the last visit, the patient was instructed to switch from Ozempic to [facility]. The patient states they made none of the recommended changes.     Patient has not yet switched from Ozempic to [facility] since he was in the hospital last month for over a week due to sepsis.  Just finished Ozempic and plans on starting Mounjaro tomrorow.   Has been checking his blood sugars regularly and reported that his blood sugars average 120s but highest was 208 and lowest was 108. Not sure why his blood sugar was so high taht morning.     Last time he checked his weight, he was 260 lb (04/07/22). Wants to get down to 250 lb.   Eating 2-3 meals a day. Lunch is usually 12:45 - dinner is around 5-5:30     Diabetes    HPI  Current diabetes medication regimen:   - metformin XR 500 mg in the morning and 1000 mg in the evening   - Ozempic 2 mg once a week on Tuesdays - took last dose last week    Previous medications for diabetes:   - Mounjaro (10 mg) - switched to Ozempic due to shortage  - Trulicity - switched to [employer]   - metformin IR - diarrhea   - Januvia - switched to Trulicity     Blood Glucose  Monitoring times: fasting  The baseline HbA1C was 9.4 on 07/07/2020.    Patient reported readings:  High - 208  Low 108  Average 120s    Diet and Exercise  Number of meals per day: 2  Breakfast: doesn't usually eat breakfast  Lunch: chicken salad  Dinner: mac and cheese, chicken salad, veggies, usually fairly carb heavy   Snacks: sometimes will snack throughout the day - popcorn, cookies, rice crispy, potato chips   Drinks: if not going out and wants something with his meal then will drink soda, but when he goes out will have water  Last meal of the day is usually 5 pm     Comorbidities  ASCVD condition(s): none  CKD: no  Heart  failure: no    Obesity: yes    Health Maintenance  Aspirin utilization: Patient is not taking aspirin due to the following reason(s): not indicated.  Statin utilization: Patient is taking a statin.  Hypertension: Patient has hypertension, which is not controlled.    Primary Care - Labs   Basic Metabolic Profile    Lab Results   Component Value Date/Time    NA 138 05/23/2022 03:15 AM    K 5.2 (H) 05/23/2022 03:15 AM    CA 8.2 (L) 05/23/2022 03:15 AM    CL 100 05/23/2022 03:15 AM    CO2 31 (H) 05/23/2022 03:15 AM    GAP 7 05/23/2022 03:15 AM    EGFR1 >60 05/23/2022 03:15 AM    Lab Results   Component Value Date/Time    BUN 10 05/23/2022 03:15 AM    CR 0.78 05/23/2022 03:15 AM    GLU 135 (H) 05/23/2022 03:15 AM    GLU 113 (H) 10/29/2020 02:53 PM         Lab Draw:  Lab Results   Component Value Date/Time    HGBA1C 8.0 (H) 10/28/2021 02:42 PM HGBA1C 8.6 (H) 04/29/2021 12:32 PM    HGBA1C 9.5 (H) 10/29/2020 02:53 PM     POC:  Lab Results   Component Value Date/Time    A1C 8.3 (A) 04/27/2022 12:00 AM        No results found for: MCALB24   Microalbumin/CR ratio Urine   Date Value Ref Range Status   10/28/2021 54.41 (H) <30 ug/mg Final     Comment:     NOTE NEW REFERENCE RANGES     Microalbumin, Random   Date Value Ref Range Status   10/28/2021 50.6 MCG/ML Final        Lab Results   Component Value Date    CHOL 153 01/20/2022    TRIG 229 (H) 01/20/2022    HDL 33 (L) 01/20/2022    LDL 100 (H) 01/20/2022    VLDL 46 01/20/2022    NONHDLCHOL 120 01/20/2022    CHOLHDLC 6.9 (H) 10/29/2020         BP Readings from Last 1 Encounters:   06/01/22 118/72      Medication History  Medication history was not completed because previously completed (follow-up visit).    Adverse Drug Reactions  Adverse drug reactions were reviewed with the patient.    Significant adverse drug reaction(s) were not identified.    Side effect(s) were not reported.    The patient is filling their medications through 2 pharmacies due to the following reason(s): patient preference. Patient is currently using [facility] pharmacy. Patient is currently using [facility] based on patient preference.    Home Medications    Medication Sig   acetaminophen (TYLENOL EXTRA STRENGTH) 500 mg tablet Take two tablets by mouth every 6 hours as needed for Pain. Max of 4,000 mg of acetaminophen in 24 hours.  Indications: pain   blood sugar diagnostic (ONETOUCH ULTRA TEST) test strip Use to check blood sugars daily. E11.65   blood-glucose meter (ACCU-CHEK GUIDE GLUCOSE METER) kit Use one strip as directed daily. Diagnosis Code: DM-2   carvediloL (COREG) 3.125 mg tablet Take one tablet by mouth twice daily. Take with food.  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   cyanocobalamin (vitamin B-12) 3,000 mcg cap Take 1 capsule by mouth  daily.   ezetimibe (ZETIA) 10 mg tablet Take one tablet by mouth daily.   ferrous sulfate (SLOW RELEASE IRON) 142 mg (45 mg iron) ER tablet Take one tablet by mouth daily.   furosemide (LASIX) 20 mg tablet Take one tablet by mouth daily as needed.   icosapent ethyL (VASCEPA) 1 gram capsule Take two capsules by mouth twice daily with meals.   lancets 33 gauge 33 gauge Use to check blood sugars daily E11.65   Leg Brace misc Bilateral Fitted leg brace and right shoe for foot drop.  Dx: spina bifida.   levETIRAcetam (KEPPRA) 500 mg tablet Take one tablet by mouth twice daily.   loperamide (IMODIUM A-D) 2 mg capsule Take one capsule by mouth as Needed for Diarrhea.   losartan (COZAAR) 50 mg tablet Take one tablet by mouth daily.   metFORMIN-XR (GLUCOPHAGE XR) 500 mg extended release tablet Take one tablet by mouth daily with breakfast AND two tablets daily with dinner.   Miscellaneous Medical Supply misc Shoe lift for right foot.  Dx: spina bifida   multivit-min/folic/vit K/lycop (ONE-A-DAY MEN'S MULTIVITAMIN PO) Take 1 tablet by mouth at bedtime daily.   Ostomy Supplies 1  misc Hollister 1 piece Urostomy bag.  Use 1 bag as needed.   psyllium seed (with sugar) (FIBER PO) Take 1 tablet by mouth twice daily.   rosuvastatin (CRESTOR) 40 mg tablet Take one tablet by mouth at bedtime daily.   sennosides-docusate sodium (SENOKOT-S) 8.6/50 mg tablet Take one tablet by mouth twice daily. Indications: constipation   tirzepatide (MOUNJARO) 10 mg/0.5 mL injector PEN Inject 0.5 mL under the skin every 7 days.   triamcinolone acetonide (TRIDERM) 0.1 % topical cream Apply  topically to affected area twice daily.  Patient taking differently: Apply  topically to affected area as Needed.      Education  Education provided: not necessary    Time spent with patient: 20 minutes    Reece Leader, [facility]

## 2022-06-15 ENCOUNTER — Encounter: Admit: 2022-06-15 | Discharge: 2022-06-15 | Payer: MEDICARE

## 2022-06-15 DIAGNOSIS — R0681 Apnea, not elsewhere classified: Secondary | ICD-10-CM

## 2022-06-15 MED ORDER — MISCELLANEOUS MEDICAL SUPPLY MISC MISC
0 refills | 1.00000 days | Status: AC
Start: 2022-06-15 — End: ?

## 2022-06-21 ENCOUNTER — Encounter: Admit: 2022-06-21 | Discharge: 2022-06-21 | Payer: MEDICARE

## 2022-07-05 ENCOUNTER — Encounter: Admit: 2022-07-05 | Discharge: 2022-07-05 | Payer: MEDICARE

## 2022-07-05 ENCOUNTER — Ambulatory Visit: Admit: 2022-07-05 | Discharge: 2022-07-05 | Payer: MEDICARE

## 2022-07-05 NOTE — Progress Notes
A follow-up comprehensive medication management visit was completed today via telephone.    Referral reason: Diabetes Management  Referring provider: Orson Gear, MD    Assessment & Plan     Diabetes  Patient's diabetes is uncontrolled as reflected by home blood sugars and A1C. Their goal glucose is pre-prandial 80-130 and post-prandial <180. Their most recent A1C was 8.3% on 04/27/2022 with a goal of < 7%    Fasting blood sugars have trended down and are now close to goal range. Due to shortage of Mounjaro 12.5 mg dose, will have him continue at this dose for another month.     Plan  - Mounjaro 10 mg once a week on Tuesdays   - Continue metformin XR 500 mg AM and 1000 mg PM    Hypertension  Patient's blood pressure is controlled per home readings. Their goal blood pressure is < 130/80 mmHg    Patient reported having lower blood pressure readings despite being off carvedilol. Recommended patient stay off carvedilol for now, continue monitoring his blood pressures 1-2 times daily, and then contact his cardiologist's office for review.       Follow-up  The patient will continue to follow up with the pharmacist. Return to pharmacist in 4 weeks via telephone. The return visit was scheduled during today's visit.    Subjective & Objective   At the last visit, the patient was instructed to switch to Mounjaro 10 mg. The patient states they made none of the recommended changes.     Was able to switch back to [facility]. Has taken 3 doses of Mounjaro 10 mg once a week.   Reported he's been doing well  Has not had any side effects, no nausea, diarrhea, constipation.  No s/sx of hypoglycemia.     Last time he checked his weight, he was 260 lb (04/07/22). Wants to get down to 250 lb.   Eating 2-3 meals a day. Lunch is usually 12:45 - dinner is around 5-5:30     Diabetes    HPI  Current diabetes medication regimen:   - metformin XR 500 mg in the morning and 1000 mg in the evening   - Mounjaro 10 mg once a week on Tuesdays    Previous medications for diabetes:   - Mounjaro (10 mg) - switched to Ozempic due to shortage  - Trulicity - switched to [employer]   - metformin IR - diarrhea   - Januvia - switched to Trulicity     Blood Glucose  Monitoring times: fasting  The baseline HbA1C was 9.4 on 07/07/2020.    Patient reported readings:  Fasting average: 130   High: 178  Low: 117     Fasting   20-Jun 134  21-Jun 134  22-Jun 117  23-Jun 121  24-Jun 132    Diet and Exercise  Number of meals per day: 2  Breakfast: doesn't usually eat breakfast  Lunch: chicken salad  Dinner: mac and cheese, chicken salad, veggies, usually fairly carb heavy   Snacks: sometimes will snack throughout the day - popcorn, cookies, rice crispy, potato chips   Drinks: if not going out and wants something with his meal then will drink soda, but when he goes out will have water  Last meal of the day is usually 5 pm     Comorbidities  ASCVD condition(s): none  CKD: no  Heart failure: no    Obesity: yes    Health Maintenance  Aspirin utilization: Patient is not taking aspirin due  to the following reason(s): not indicated.  Statin utilization: Patient is taking a statin.  Hypertension: Patient has hypertension, which is not controlled.    Primary Care - Labs   Basic Metabolic Profile    Lab Results   Component Value Date/Time    NA 138 05/23/2022 03:15 AM    K 5.2 (H) 05/23/2022 03:15 AM    CA 8.2 (L) 05/23/2022 03:15 AM    CL 100 05/23/2022 03:15 AM    CO2 31 (H) 05/23/2022 03:15 AM    GAP 7 05/23/2022 03:15 AM    EGFR1 >60 05/23/2022 03:15 AM    Lab Results   Component Value Date/Time    BUN 10 05/23/2022 03:15 AM    CR 0.78 05/23/2022 03:15 AM    GLU 135 (H) 05/23/2022 03:15 AM    GLU 113 (H) 10/29/2020 02:53 PM         Lab Draw:  Lab Results   Component Value Date/Time    HGBA1C 8.0 (H) 10/28/2021 02:42 PM    HGBA1C 8.6 (H) 04/29/2021 12:32 PM    HGBA1C 9.5 (H) 10/29/2020 02:53 PM     POC:  Lab Results   Component Value Date/Time    A1C 8.3 (A) 04/27/2022 12:00 AM        No results found for: MCALB24   Microalbumin/CR ratio Urine   Date Value Ref Range Status   10/28/2021 54.41 (H) <30 ug/mg Final     Comment:     NOTE NEW REFERENCE RANGES     Microalbumin, Random   Date Value Ref Range Status   10/28/2021 50.6 MCG/ML Final        Lab Results   Component Value Date    CHOL 153 01/20/2022    TRIG 229 (H) 01/20/2022    HDL 33 (L) 01/20/2022    LDL 100 (H) 01/20/2022    VLDL 46 01/20/2022    NONHDLCHOL 120 01/20/2022    CHOLHDLC 6.9 (H) 10/29/2020         BP Readings from Last 1 Encounters:   06/01/22 118/72      Medication History  Medication history was not completed because previously completed (follow-up visit).    Adverse Drug Reactions  Adverse drug reactions were reviewed with the patient.    Significant adverse drug reaction(s) were not identified.    Side effect(s) were not reported.    The patient is filling their medications through 2 pharmacies due to the following reason(s): patient preference. Patient is currently using [facility] pharmacy. Patient is currently using [facility] based on patient preference.    Home Medications    Medication Sig   acetaminophen (TYLENOL EXTRA STRENGTH) 500 mg tablet Take two tablets by mouth every 6 hours as needed for Pain. Max of 4,000 mg of acetaminophen in 24 hours.  Indications: pain   blood sugar diagnostic (ONETOUCH ULTRA TEST) test strip Use to check blood sugars daily. E11.65   blood-glucose meter (ACCU-CHEK GUIDE GLUCOSE METER) kit Use one strip as directed daily. Diagnosis Code: DM-2   carvediloL (COREG) 3.125 mg tablet Take one tablet by mouth twice daily. Take with food.  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   cyanocobalamin (vitamin B-12) 3,000 mcg cap Take 1 capsule by mouth daily.   ezetimibe (ZETIA) 10 mg tablet Take one tablet by mouth daily. ferrous sulfate (SLOW RELEASE  IRON) 142 mg (45 mg iron) ER tablet Take one tablet by mouth daily.   furosemide (LASIX) 20 mg tablet Take one tablet by mouth daily as needed.   icosapent ethyL (VASCEPA) 1 gram capsule Take two capsules by mouth twice daily with meals.   lancets 33 gauge 33 gauge Use to check blood sugars daily E11.65   Leg Brace misc Bilateral Fitted leg brace and right shoe for foot drop.  Dx: spina bifida.   levETIRAcetam (KEPPRA) 500 mg tablet Take one tablet by mouth twice daily.   loperamide (IMODIUM A-D) 2 mg capsule Take one capsule by mouth as Needed for Diarrhea.   losartan (COZAAR) 50 mg tablet Take one tablet by mouth daily.   metFORMIN-XR (GLUCOPHAGE XR) 500 mg extended release tablet Take one tablet by mouth daily with breakfast AND two tablets daily with dinner.   Miscellaneous Medical Supply misc CPAP- auto PAP with heated humdification, 4-20, with mask of patient's choice.  Indications: severe sleep apnea   Miscellaneous Medical Supply misc Shoe lift for right foot.  Dx: spina bifida   multivit-min/folic/vit K/lycop (ONE-A-DAY MEN'S MULTIVITAMIN PO) Take 1 tablet by mouth at bedtime daily.   Ostomy Supplies 1  misc [employer] 1 piece Urostomy bag.  Use 1 bag as needed.   psyllium seed (with sugar) (FIBER PO) Take 1 tablet by mouth twice daily.   rosuvastatin (CRESTOR) 40 mg tablet Take one tablet by mouth at bedtime daily.   sennosides-docusate sodium (SENOKOT-S) 8.6/50 mg tablet Take one tablet by mouth twice daily. Indications: constipation   tirzepatide (MOUNJARO) 10 mg/0.5 mL injector PEN Inject 0.5 mL under the skin every 7 days.   triamcinolone acetonide (TRIDERM) 0.1 % topical cream Apply  topically to affected area twice daily.  Patient taking differently: Apply  topically to affected area as Needed.      Education  Education provided: not necessary    Time spent with patient: 20 minutes    Reece Leader, [facility]

## 2022-07-06 ENCOUNTER — Encounter: Admit: 2022-07-06 | Discharge: 2022-07-06 | Payer: MEDICARE

## 2022-07-07 ENCOUNTER — Encounter: Admit: 2022-07-07 | Discharge: 2022-07-07 | Payer: MEDICARE

## 2022-07-07 MED FILL — MOUNJARO 10 MG/0.5 ML SC PNIJ: 10 mg/0.5 mL | SUBCUTANEOUS | 28 days supply | Qty: 2 | Fill #2 | Status: AC

## 2022-07-13 ENCOUNTER — Encounter: Admit: 2022-07-13 | Discharge: 2022-07-13 | Payer: MEDICARE

## 2022-07-14 ENCOUNTER — Encounter: Admit: 2022-07-14 | Discharge: 2022-07-14 | Payer: MEDICARE

## 2022-07-16 ENCOUNTER — Encounter: Admit: 2022-07-16 | Discharge: 2022-07-16 | Payer: MEDICARE

## 2022-07-17 ENCOUNTER — Encounter: Admit: 2022-07-17 | Discharge: 2022-07-17 | Payer: MEDICARE

## 2022-07-17 MED FILL — ONETOUCH ULTRA TEST MISC STRP: 50 days supply | Qty: 50 | Fill #5 | Status: CP

## 2022-07-17 MED FILL — LANCETS 33 GAUGE MISC MISC: 33 gauge | 90 days supply | Qty: 100 | Fill #3 | Status: CP

## 2022-07-22 ENCOUNTER — Encounter: Admit: 2022-07-22 | Discharge: 2022-07-22 | Payer: MEDICARE

## 2022-07-22 DIAGNOSIS — G40209 Localization-related (focal) (partial) symptomatic epilepsy and epileptic syndromes with complex partial seizures, not intractable, without status epilepticus: Secondary | ICD-10-CM

## 2022-07-22 MED ORDER — LEVETIRACETAM 500 MG PO TAB
500 mg | ORAL_TABLET | Freq: Two times a day (BID) | ORAL | 3 refills | 90.00000 days | Status: AC
Start: 2022-07-22 — End: ?

## 2022-07-31 ENCOUNTER — Encounter: Admit: 2022-07-31 | Discharge: 2022-07-31 | Payer: MEDICARE

## 2022-08-02 ENCOUNTER — Ambulatory Visit: Admit: 2022-08-02 | Discharge: 2022-08-02 | Payer: MEDICARE

## 2022-08-02 DIAGNOSIS — E119 Type 2 diabetes mellitus without complications: Secondary | ICD-10-CM

## 2022-08-02 MED ORDER — MOUNJARO 12.5 MG/0.5 ML SC PNIJ
12.5 mg | SUBCUTANEOUS | 1 refills | Status: AC
Start: 2022-08-02 — End: ?
  Filled 2022-08-16: qty 2, 28d supply, fill #1

## 2022-08-02 NOTE — Progress Notes
A follow-up comprehensive medication management visit was completed today via telephone.    Referral reason: Diabetes Management  Referring provider: Orson Gear, MD    Assessment & Plan     Diabetes  Patient's diabetes is uncontrolled as reflected by home blood sugars and A1C. Their goal glucose is pre-prandial 80-130 and post-prandial <180. Their most recent A1C was 8.3% on 04/27/2022 with a goal of < 7%    Fasting blood sugars improved, will plan to increase his Mounjaro dose. Currently on shortage so if unable to get prescription filled then will continue on 10 mg or decrease to 7.5 mg depending on stock.    Plan  - INCREASE to Mounjaro 12.5 mg once a week on Tuesdays   - Continue metformin XR 500 mg AM and 1000 mg PM      Follow-up  The patient will continue to follow up with the pharmacist. Return to pharmacist in 4 weeks via telephone. The return visit was scheduled during today's visit.    Subjective & Objective   At the last visit, the patient was instructed to con't current regimen. The patient states they made all of the recommended changes.     Seeing PCP on 8/21 for f/u appt.  Not having any issues with his current regimen. Not having any stomach issues.   Hasn't missed any doses of my medications.   His last month average blood sugar 120   Has one dose of Mounjaro for tomorrow.     Last time he checked his weight, he was 260 lb (04/07/22). Wants to get down to 250 lb.   Eating 2-3 meals a day. Lunch is usually 12:45 - dinner is around 5-5:30     Diabetes    HPI  Current diabetes medication regimen:   - metformin XR 500 mg in the morning and 1000 mg in the evening   - Mounjaro 10 mg once a week on Tuesdays    Previous medications for diabetes:   - Mounjaro (10 mg) - switched to Ozempic due to shortage  - Trulicity - switched to [employer]   - metformin IR - diarrhea   - Januvia - switched to Trulicity     Blood Glucose  Monitoring times: fasting  The baseline HbA1C was 9.4 on 07/07/2020.    Patient reported readings:  Fasting average: 120  High: 142 mg/dL  Low: 630 mg/dL   fasting  16-WFU 932  16-Jul 138  17-Jul 129  18-Jul 137  19-Jul 123  20-Jul 118  21-Jul 118  22-Jul 121  Avg 126.7    Diet and Exercise  Number of meals per day: 2  Breakfast: doesn't usually eat breakfast  Lunch: chicken salad  Dinner: mac and cheese, chicken salad, veggies, usually fairly carb heavy   Snacks: sometimes will snack throughout the day - popcorn, cookies, rice crispy, potato chips   Drinks: if not going out and wants something with his meal then will drink soda, but when he goes out will have water  Last meal of the day is usually 5 pm     Comorbidities  ASCVD condition(s): none  CKD: no  Heart failure: no    Obesity: yes    Health Maintenance  Aspirin utilization: Patient is not taking aspirin due to the following reason(s): not indicated.  Statin utilization: Patient is taking a statin.  Hypertension: Patient has hypertension, which is not controlled.    Primary Care - Labs   Basic Metabolic Profile    Lab  Results   Component Value Date/Time    NA 138 05/23/2022 03:15 AM    K 5.2 (H) 05/23/2022 03:15 AM    CA 8.2 (L) 05/23/2022 03:15 AM    CL 100 05/23/2022 03:15 AM    CO2 31 (H) 05/23/2022 03:15 AM    GAP 7 05/23/2022 03:15 AM    EGFR1 >60 05/23/2022 03:15 AM    Lab Results   Component Value Date/Time    BUN 10 05/23/2022 03:15 AM    CR 0.78 05/23/2022 03:15 AM    GLU 135 (H) 05/23/2022 03:15 AM    GLU 113 (H) 10/29/2020 02:53 PM         Lab Draw:  Lab Results   Component Value Date/Time    HGBA1C 8.0 (H) 10/28/2021 02:42 PM    HGBA1C 8.6 (H) 04/29/2021 12:32 PM    HGBA1C 9.5 (H) 10/29/2020 02:53 PM     POC:  Lab Results   Component Value Date/Time    A1C 8.3 (A) 04/27/2022 12:00 AM        No results found for: MCALB24   Microalbumin/CR ratio Urine   Date Value Ref Range Status   10/28/2021 54.41 (H) <30 ug/mg Final     Comment:     NOTE NEW REFERENCE RANGES     Microalbumin, Random   Date Value Ref Range Status 10/28/2021 50.6 MCG/ML Final        Lab Results   Component Value Date    CHOL 153 01/20/2022    TRIG 229 (H) 01/20/2022    HDL 33 (L) 01/20/2022    LDL 100 (H) 01/20/2022    VLDL 46 01/20/2022    NONHDLCHOL 120 01/20/2022    CHOLHDLC 6.9 (H) 10/29/2020         BP Readings from Last 1 Encounters:   06/01/22 118/72      Medication History  Medication history was not completed because previously completed (follow-up visit).    Adverse Drug Reactions  Adverse drug reactions were reviewed with the patient.    Significant adverse drug reaction(s) were not identified.    Side effect(s) were not reported.    The patient is filling their medications through 2 pharmacies due to the following reason(s): patient preference. Patient is currently using [facility] pharmacy. Patient is currently using [facility] based on patient preference.    Home Medications    Medication Sig   acetaminophen (TYLENOL EXTRA STRENGTH) 500 mg tablet Take two tablets by mouth every 6 hours as needed for Pain. Max of 4,000 mg of acetaminophen in 24 hours.  Indications: pain   blood sugar diagnostic (ONETOUCH ULTRA TEST) test strip Use to check blood sugars daily. E11.65   blood-glucose meter (ACCU-CHEK GUIDE GLUCOSE METER) kit Use one strip as directed daily. Diagnosis Code: DM-2   carvediloL (COREG) 3.125 mg tablet Take one tablet by mouth twice daily. Take with food.  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   cyanocobalamin (vitamin B-12) 3,000 mcg cap Take 1 capsule by mouth daily.   ezetimibe (ZETIA) 10 mg tablet Take one tablet by mouth daily.   ferrous sulfate (SLOW RELEASE IRON) 142 mg (45 mg iron) ER tablet Take one tablet by mouth daily.   furosemide (LASIX) 20 mg tablet Take one tablet by mouth daily as needed.   icosapent ethyL (VASCEPA) 1 gram capsule Take two  capsules by mouth twice daily with meals.   lancets 33 gauge 33 gauge Use to check blood sugars daily E11.65   Leg Brace misc Bilateral Fitted leg brace and right shoe for foot drop.  Dx: spina bifida.   levETIRAcetam (KEPPRA) 500 mg tablet TAKE 1 TABLET BY MOUTH TWICE DAILY   loperamide (IMODIUM A-D) 2 mg capsule Take one capsule by mouth as Needed for Diarrhea.   losartan (COZAAR) 50 mg tablet Take one tablet by mouth daily.   metFORMIN-XR (GLUCOPHAGE XR) 500 mg extended release tablet Take one tablet by mouth daily with breakfast AND two tablets daily with dinner.   Miscellaneous [employer] misc CPAP- auto PAP with heated humdification, 4-20, with mask of patient's choice.  Indications: severe sleep apnea   Miscellaneous Medical Supply misc Shoe lift for right foot.  Dx: spina bifida   multivit-min/folic/vit K/lycop (ONE-A-DAY MEN'S MULTIVITAMIN PO) Take 1 tablet by mouth at bedtime daily.   Ostomy Supplies 1  misc [employer] 1 piece Urostomy bag.  Use 1 bag as needed.   psyllium seed (with sugar) (FIBER PO) Take 1 tablet by mouth twice daily.   rosuvastatin (CRESTOR) 40 mg tablet Take one tablet by mouth at bedtime daily.   sennosides-docusate sodium (SENOKOT-S) 8.6/50 mg tablet Take one tablet by mouth twice daily. Indications: constipation   tirzepatide (MOUNJARO) 10 mg/0.5 mL injector PEN Inject 0.5 mL under the skin every 7 days.   triamcinolone acetonide (TRIDERM) 0.1 % topical cream Apply  topically to affected area twice daily.  Patient taking differently: Apply  topically to affected area as Needed.      Education  Education provided: not necessary    Time spent with patient: 20 minutes    Reece Leader, [facility]

## 2022-08-03 ENCOUNTER — Encounter: Admit: 2022-08-03 | Discharge: 2022-08-03 | Payer: MEDICARE

## 2022-08-05 ENCOUNTER — Encounter: Admit: 2022-08-05 | Discharge: 2022-08-05 | Payer: MEDICARE

## 2022-08-05 DIAGNOSIS — E1165 Type 2 diabetes mellitus with hyperglycemia: Secondary | ICD-10-CM

## 2022-08-05 MED ORDER — OZEMPIC 2 MG/DOSE (8 MG/3 ML) SC PNIJ
2 mg | SUBCUTANEOUS | 0 refills | Status: AC
Start: 2022-08-05 — End: ?

## 2022-08-09 ENCOUNTER — Encounter: Admit: 2022-08-09 | Discharge: 2022-08-09 | Payer: MEDICARE

## 2022-08-12 ENCOUNTER — Encounter: Admit: 2022-08-12 | Discharge: 2022-08-12 | Payer: MEDICARE

## 2022-08-13 ENCOUNTER — Encounter: Admit: 2022-08-13 | Discharge: 2022-08-13 | Payer: MEDICARE

## 2022-08-16 ENCOUNTER — Encounter: Admit: 2022-08-16 | Discharge: 2022-08-16 | Payer: MEDICARE

## 2022-08-18 ENCOUNTER — Encounter: Admit: 2022-08-18 | Discharge: 2022-08-18 | Payer: MEDICARE

## 2022-08-25 ENCOUNTER — Encounter: Admit: 2022-08-25 | Discharge: 2022-08-25 | Payer: MEDICARE

## 2022-08-30 ENCOUNTER — Ambulatory Visit: Admit: 2022-08-30 | Discharge: 2022-08-31 | Payer: MEDICARE

## 2022-08-30 ENCOUNTER — Encounter: Admit: 2022-08-30 | Discharge: 2022-08-30 | Payer: MEDICARE

## 2022-08-30 NOTE — Progress Notes
A follow-up comprehensive medication management visit was completed today via telephone.    Referral reason: Diabetes Management  Referring provider: Orson Gear, MD    Assessment & Plan     Diabetes  Patient's diabetes is uncontrolled as reflected by home blood sugars and A1C. Their goal glucose is pre-prandial 80-130 and post-prandial <180. Their most recent A1C was 8.3% on 04/27/2022 with a goal of < 7%    Continues to do well on Mounjaro 10 mg, was not able to increase dose last month due to shortage however will plan to increase dose at this time depending on availability.    Plan  - INCREASE to Mounjaro 12.5 mg once a week on Tuesdays   - Continue metformin XR 500 mg AM and 1000 mg PM    Follow-up  The patient will continue to follow up with the pharmacist. Return to pharmacist in 4 weeks via telephone. The return visit was scheduled during today's visit.    Subjective & Objective   At the last visit, the patient was instructed to con't current regimen. The patient states they made all of the recommended changes.     Seeing PCP on 8/21 for f/u appt.  Wasn't able to increase Mounjaro dose due to shortage. Has continued on Mounjaro 10 mg.   Did have a very high blood sugar of 212 mg/dL, not sure why it was that high.   Not having any issues with GI side effects.     Blood pressures 130s/80s    Last time he checked his weight, he was 260 lb (04/07/22). Wants to get down to 250 lb.   Eating 2-3 meals a day. Lunch is usually 12:45 - dinner is around 5-5:30     Diabetes    HPI  Current diabetes medication regimen:   - metformin XR 500 mg in the morning and 1000 mg in the evening   - Mounjaro 10 mg once a week on Tuesdays    Previous medications for diabetes:   - Mounjaro (10 mg) - switched to Ozempic due to shortage  - Trulicity - switched to [employer]   - metformin IR - diarrhea   - Januvia - switched to Trulicity     Blood Glucose  Monitoring times: fasting  The baseline HbA1C was 9.4 on 07/07/2020.    Patient reported readings:   fasting  19-Aug 148  18-Aug 122  17-Aug 130  16-Aug 136  15-Aug 111  14-Aug 125  13-Aug 117  12-Aug 133  11-Aug 125  10-Aug 130  9-Aug 212  8-Aug 123  7-Aug 142  6-Aug 143  5-Aug 138  4-Aug 124  avg 134.9375    Diet and Exercise  Number of meals per day: 2  Breakfast: doesn't usually eat breakfast  Lunch: chicken salad  Dinner: mac and cheese, chicken salad, veggies, usually fairly carb heavy   Snacks: sometimes will snack throughout the day - popcorn, cookies, rice crispy, potato chips   Drinks: if not going out and wants something with his meal then will drink soda, but when he goes out will have water  Last meal of the day is usually 5 pm     Comorbidities  ASCVD condition(s): none  CKD: no  Heart failure: no    Obesity: yes    Health Maintenance  Aspirin utilization: Patient is not taking aspirin due to the following reason(s): not indicated.  Statin utilization: Patient is taking a statin.  Hypertension: Patient has hypertension, which is not controlled.  Primary Care - Labs   Basic Metabolic Profile    Lab Results   Component Value Date/Time    NA 138 05/23/2022 03:15 AM    K 5.2 (H) 05/23/2022 03:15 AM    CA 8.2 (L) 05/23/2022 03:15 AM    CL 100 05/23/2022 03:15 AM    CO2 31 (H) 05/23/2022 03:15 AM    GAP 7 05/23/2022 03:15 AM    EGFR1 >60 05/23/2022 03:15 AM    Lab Results   Component Value Date/Time    BUN 10 05/23/2022 03:15 AM    CR 0.78 05/23/2022 03:15 AM    GLU 135 (H) 05/23/2022 03:15 AM    GLU 113 (H) 10/29/2020 02:53 PM         Lab Draw:  Lab Results   Component Value Date/Time    HGBA1C 8.0 (H) 10/28/2021 02:42 PM    HGBA1C 8.6 (H) 04/29/2021 12:32 PM    HGBA1C 9.5 (H) 10/29/2020 02:53 PM     POC:  Lab Results   Component Value Date/Time    A1C 8.3 (A) 04/27/2022 12:00 AM        No results found for: MCALB24   Microalbumin/CR ratio Urine   Date Value Ref Range Status   10/28/2021 54.41 (H) <30 ug/mg Final     Comment:     NOTE NEW REFERENCE RANGES     Microalbumin, Random Date Value Ref Range Status   10/28/2021 50.6 MCG/ML Final        Lab Results   Component Value Date    CHOL 153 01/20/2022    TRIG 229 (H) 01/20/2022    HDL 33 (L) 01/20/2022    LDL 100 (H) 01/20/2022    VLDL 46 01/20/2022    NONHDLCHOL 120 01/20/2022    CHOLHDLC 6.9 (H) 10/29/2020         BP Readings from Last 1 Encounters:   06/01/22 118/72      Medication History  Medication history was not completed because previously completed (follow-up visit).    Adverse Drug Reactions  Adverse drug reactions were reviewed with the patient.    Significant adverse drug reaction(s) were not identified.    Side effect(s) were not reported.    The patient is filling their medications through 2 pharmacies due to the following reason(s): patient preference. Patient is currently using [facility] pharmacy. Patient is currently using [facility] based on patient preference.    Home Medications    Medication Sig   acetaminophen (TYLENOL EXTRA STRENGTH) 500 mg tablet Take two tablets by mouth every 6 hours as needed for Pain. Max of 4,000 mg of acetaminophen in 24 hours.  Indications: pain   blood sugar diagnostic (ONETOUCH ULTRA TEST) test strip Use to check blood sugars daily. E11.65   blood-glucose meter (ACCU-CHEK GUIDE GLUCOSE METER) kit Use one strip as directed daily. Diagnosis Code: DM-2   carvediloL (COREG) 3.125 mg tablet Take one tablet by mouth twice daily. Take with food.  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   cyanocobalamin (vitamin B-12) 3,000 mcg cap Take 1 capsule by mouth daily.   ezetimibe (ZETIA) 10 mg tablet Take one tablet by mouth daily.   ferrous sulfate (SLOW RELEASE IRON) 142 mg (45 mg iron) ER tablet Take one tablet by mouth daily.   furosemide (LASIX) 20 mg tablet Take one tablet by mouth  daily as needed.   icosapent ethyL (VASCEPA) 1 gram capsule Take two capsules by mouth twice daily with meals.   lancets 33 gauge 33 gauge Use to check blood sugars daily E11.65   Leg Brace misc Bilateral Fitted leg brace and right shoe for foot drop.  Dx: spina bifida.   levETIRAcetam (KEPPRA) 500 mg tablet TAKE 1 TABLET BY MOUTH TWICE DAILY   loperamide (IMODIUM A-D) 2 mg capsule Take one capsule by mouth as Needed for Diarrhea.   losartan (COZAAR) 50 mg tablet Take one tablet by mouth daily.   metFORMIN-XR (GLUCOPHAGE XR) 500 mg extended release tablet Take one tablet by mouth daily with breakfast AND two tablets daily with dinner.   Miscellaneous [employer] misc CPAP- auto PAP with heated humdification, 4-20, with mask of patient's choice.  Indications: severe sleep apnea   Miscellaneous Medical Supply misc Shoe lift for right foot.  Dx: spina bifida   multivit-min/folic/vit K/lycop (ONE-A-DAY MEN'S MULTIVITAMIN PO) Take 1 tablet by mouth at bedtime daily.   Ostomy Supplies 1  misc [employer] 1 piece Urostomy bag.  Use 1 bag as needed.   psyllium seed (with sugar) (FIBER PO) Take 1 tablet by mouth twice daily.   rosuvastatin (CRESTOR) 40 mg tablet Take one tablet by mouth at bedtime daily.   sennosides-docusate sodium (SENOKOT-S) 8.6/50 mg tablet Take one tablet by mouth twice daily. Indications: constipation   tirzepatide (MOUNJARO) 10 mg/0.5 mL injector PEN Inject 0.5 mL under the skin every 7 days.   tirzepatide (MOUNJARO) 12.5 mg/0.5 mL injector PEN Inject 0.5 mL under the skin every 7 days.   triamcinolone acetonide (TRIDERM) 0.1 % topical cream Apply  topically to affected area twice daily.  Patient taking differently: Apply  topically to affected area as Needed.      Education  Education provided: not necessary    Time spent with patient: 20 minutes    Reece Leader, [facility]

## 2022-09-01 ENCOUNTER — Ambulatory Visit: Admit: 2022-09-01 | Discharge: 2022-09-01 | Payer: MEDICARE

## 2022-09-01 ENCOUNTER — Encounter: Admit: 2022-09-01 | Discharge: 2022-09-01 | Payer: MEDICARE

## 2022-09-01 DIAGNOSIS — N319 Neuromuscular dysfunction of bladder, unspecified: Secondary | ICD-10-CM

## 2022-09-01 DIAGNOSIS — R569 Unspecified convulsions: Secondary | ICD-10-CM

## 2022-09-01 DIAGNOSIS — R339 Retention of urine, unspecified: Secondary | ICD-10-CM

## 2022-09-01 DIAGNOSIS — E538 Deficiency of other specified B group vitamins: Secondary | ICD-10-CM

## 2022-09-01 DIAGNOSIS — I10 Essential (primary) hypertension: Secondary | ICD-10-CM

## 2022-09-01 DIAGNOSIS — T85618A Breakdown (mechanical) of other specified internal prosthetic devices, implants and grafts, initial encounter: Secondary | ICD-10-CM

## 2022-09-01 DIAGNOSIS — Q059 Spina bifida, unspecified: Secondary | ICD-10-CM

## 2022-09-01 DIAGNOSIS — F988 Other specified behavioral and emotional disorders with onset usually occurring in childhood and adolescence: Secondary | ICD-10-CM

## 2022-09-01 DIAGNOSIS — E1165 Type 2 diabetes mellitus with hyperglycemia: Secondary | ICD-10-CM

## 2022-09-01 DIAGNOSIS — N390 Urinary tract infection, site not specified: Secondary | ICD-10-CM

## 2022-09-01 DIAGNOSIS — Q0700 Arnold-Chiari syndrome without spina bifida or hydrocephalus: Secondary | ICD-10-CM

## 2022-09-01 DIAGNOSIS — I429 Cardiomyopathy, unspecified: Secondary | ICD-10-CM

## 2022-09-01 DIAGNOSIS — D649 Anemia, unspecified: Secondary | ICD-10-CM

## 2022-09-01 DIAGNOSIS — G919 Hydrocephalus, unspecified: Secondary | ICD-10-CM

## 2022-09-01 DIAGNOSIS — G4733 Obstructive sleep apnea (adult) (pediatric): Secondary | ICD-10-CM

## 2022-09-01 DIAGNOSIS — R000 Tachycardia, unspecified: Secondary | ICD-10-CM

## 2022-09-01 DIAGNOSIS — Z01810 Encounter for preprocedural cardiovascular examination: Secondary | ICD-10-CM

## 2022-09-01 DIAGNOSIS — R51 Headache: Secondary | ICD-10-CM

## 2022-09-01 DIAGNOSIS — N200 Calculus of kidney: Secondary | ICD-10-CM

## 2022-09-02 LAB — COMPREHENSIVE METABOLIC PANEL
POTASSIUM: 4.3 MMOL/L (ref 3.5–5.1)
SODIUM: 139 MMOL/L (ref 137–147)

## 2022-09-02 LAB — CBC AND DIFF
ABSOLUTE BASO COUNT: 0 10*3/uL (ref 0–0.20)
ABSOLUTE EOS COUNT: 0.1 10*3/uL (ref 0–0.45)
ABSOLUTE LYMPH COUNT: 2.3 10*3/uL (ref 1.0–4.8)
ABSOLUTE MONO COUNT: 0.6 10*3/uL (ref 0–0.80)
ABSOLUTE NEUTROPHIL: 6.5 10*3/uL (ref 1.8–7.0)
HEMATOCRIT: 45 % (ref 40–50)
MCH: 26 pg (ref 26–34)
MCHC: 32 g/dL (ref 32.0–36.0)
MCV: 79 FL — ABNORMAL LOW (ref 80–100)
PLATELET COUNT: 364 10*3/uL (ref 150–400)
RBC COUNT: 5.7 M/UL — ABNORMAL HIGH (ref 4.4–5.5)
RDW: 15 % — ABNORMAL HIGH (ref 11–15)
WBC COUNT: 9.7 10*3/uL (ref 4.5–11.0)

## 2022-09-02 LAB — VITAMIN B12: VITAMIN B12: 158 pg/mL — ABNORMAL HIGH (ref 180–914)

## 2022-09-05 ENCOUNTER — Encounter: Admit: 2022-09-05 | Discharge: 2022-09-05 | Payer: MEDICARE

## 2022-09-06 ENCOUNTER — Encounter: Admit: 2022-09-06 | Discharge: 2022-09-06 | Payer: MEDICARE

## 2022-09-07 ENCOUNTER — Encounter: Admit: 2022-09-07 | Discharge: 2022-09-07 | Payer: MEDICARE

## 2022-09-08 ENCOUNTER — Encounter: Admit: 2022-09-08 | Discharge: 2022-09-08 | Payer: MEDICARE

## 2022-09-08 MED FILL — MOUNJARO 12.5 MG/0.5 ML SC PNIJ: 12.5 mg/0.5 mL | SUBCUTANEOUS | 28 days supply | Qty: 2 | Fill #1 | Status: AC

## 2022-09-08 MED FILL — ONETOUCH ULTRA TEST MISC STRP: 50 days supply | Qty: 50 | Fill #6 | Status: CP

## 2022-09-08 NOTE — Progress Notes
Forms received.  Completed and faxed back.

## 2022-09-14 ENCOUNTER — Encounter: Admit: 2022-09-14 | Discharge: 2022-09-14 | Payer: MEDICARE

## 2022-09-29 ENCOUNTER — Encounter: Admit: 2022-09-29 | Discharge: 2022-09-29 | Payer: MEDICARE

## 2022-09-29 NOTE — Progress Notes
Form filled out and faxed back

## 2022-10-04 ENCOUNTER — Encounter: Admit: 2022-10-04 | Discharge: 2022-10-04 | Payer: MEDICARE

## 2022-10-05 ENCOUNTER — Encounter: Admit: 2022-10-05 | Discharge: 2022-10-05 | Payer: MEDICARE

## 2022-10-05 ENCOUNTER — Ambulatory Visit: Admit: 2022-10-05 | Discharge: 2022-10-05 | Payer: MEDICARE

## 2022-10-05 NOTE — Progress Notes
A follow-up comprehensive medication management visit was completed today via telephone.    Referral reason: Diabetes Management  Referring provider: Orson Gear, MD    Assessment & Plan     Diabetes  Patient's diabetes is uncontrolled as reflected by home blood sugars and A1C. Their goal glucose is pre-prandial 80-130 and post-prandial <180. Their most recent A1C was 8.8% on 09/01/2022 with a goal of < 7%    Was able to increase Mounjaro dose, blood sugars with some improvement, however, he is having mild GI side effects and would prefer to stay at this dose for another month before trying to increase dose further. If unable to increase dose further than will consider adding on a 3rd agent for additional blood sugar lowering.     Plan  - Continue Mounjaro 12.5 mg once a week on Tuesdays   - Continue metformin XR 500 mg AM and 1000 mg PM    Follow-up  The patient will continue to follow up with the pharmacist. Return to pharmacist in 4 weeks via telephone. The return visit was scheduled during today's visit.    Subjective & Objective   At the last visit, the patient was instructed to increase Mounjaro 12.5 mg once a week. The patient states they made all of the recommended changes.     Saw PCP on 8/21 for f/u appt. Repeat A1C 8.8%  Had not been able to increase dose until recently  Was able to increase Mounjaro 12.5 mg on 9/3, took his 4th dose this morning.    Having some diarrhea, but only lasts the first 24 hours after injection.     Last time he checked his weight, he was 260 lb (04/07/22). Wants to get down to 250 lb.   Eating 2-3 meals a day. Lunch is usually 12:45 - dinner is around 5-5:30     Diabetes    HPI  Current diabetes medication regimen:   - metformin XR 500 mg in the morning and 1000 mg in the evening   - Mounjaro 12.5 mg once a week on Tuesdays - has taken 4 doses    Previous medications for diabetes:   - Mounjaro (10 mg) - switched to Ozempic due to shortage  - Trulicity - switched to [employer]   - metformin IR - diarrhea   - Januvia - switched to Trulicity     Blood Glucose  Monitoring times: fasting  The baseline HbA1C was 9.4 on 07/07/2020.    Patient reported readings:   fasting  14-Sep 113  15-Sep 138  16-Sep 157  17-Sep 133  18-Sep 130  19-Sep 120  20-Sep 141  21-Sep 125  22-Sep 124  23-Sep 136  24-Sep 114  avg 131  Previous avg 134.9    Diet and Exercise  Number of meals per day: 2  Breakfast: doesn't usually eat breakfast  Lunch: chicken salad  Dinner: mac and cheese, chicken salad, veggies, usually fairly carb heavy   Snacks: sometimes will snack throughout the day - popcorn, cookies, rice crispy, potato chips   Drinks: if not going out and wants something with his meal then will drink soda, but when he goes out will have water  Last meal of the day is usually 5 pm     Comorbidities  ASCVD condition(s): none  CKD: no  Heart failure: no    Obesity: yes    Health Maintenance  Aspirin utilization: Patient is not taking aspirin due to the following reason(s): not indicated.  Statin  utilization: Patient is taking a statin.  Hypertension: Patient has hypertension, which is not controlled.    Primary Care - Labs   Basic Metabolic Profile    Lab Results   Component Value Date/Time    NA 139 09/01/2022 03:14 PM    K 4.3 09/01/2022 03:14 PM    CA 9.4 09/01/2022 03:14 PM    CL 100 09/01/2022 03:14 PM    CO2 28 09/01/2022 03:14 PM    GAP 11 09/01/2022 03:14 PM    EGFR1 >60 09/01/2022 03:14 PM    Lab Results   Component Value Date/Time    BUN 14 09/01/2022 03:14 PM    CR 0.78 09/01/2022 03:14 PM    GLU 151 (H) 09/01/2022 03:14 PM    GLU 113 (H) 10/29/2020 02:53 PM         Lab Draw:  Lab Results   Component Value Date/Time    HGBA1C 8.8 (H) 09/01/2022 03:14 PM    HGBA1C 8.0 (H) 10/28/2021 02:42 PM    HGBA1C 8.6 (H) 04/29/2021 12:32 PM     POC:  Lab Results   Component Value Date/Time    A1C 8.3 (A) 04/27/2022 12:00 AM        No results found for: MCALB24   Microalbumin/CR ratio Urine   Date Value Ref Range Status 10/28/2021 54.41 (H) <30 ug/mg Final     Comment:     NOTE NEW REFERENCE RANGES     Microalbumin, Random   Date Value Ref Range Status   10/28/2021 50.6 MCG/ML Final        Lab Results   Component Value Date    CHOL 153 01/20/2022    TRIG 229 (H) 01/20/2022    HDL 33 (L) 01/20/2022    LDL 100 (H) 01/20/2022    VLDL 46 01/20/2022    NONHDLCHOL 120 01/20/2022    CHOLHDLC 6.9 (H) 10/29/2020         BP Readings from Last 1 Encounters:   09/01/22 132/74      Medication History  Medication history was not completed because previously completed (follow-up visit).    Adverse Drug Reactions  Adverse drug reactions were reviewed with the patient.    Significant adverse drug reaction(s) were not identified.    Side effect(s) were not reported.    The patient is filling their medications through 2 pharmacies due to the following reason(s): patient preference. Patient is currently using [facility] pharmacy. Patient is currently using [facility] based on patient preference.    Home Medications    Medication Sig   acetaminophen (TYLENOL EXTRA STRENGTH) 500 mg tablet Take two tablets by mouth every 6 hours as needed for Pain. Max of 4,000 mg of acetaminophen in 24 hours.  Indications: pain   blood sugar diagnostic (ONETOUCH ULTRA TEST) test strip Use to check blood sugars daily. E11.65   blood-glucose meter (ACCU-CHEK GUIDE GLUCOSE METER) kit Use one strip as directed daily. Diagnosis Code: DM-2   carvediloL (COREG) 6.25 mg tablet Take one tablet by mouth twice daily. Take with food.  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   cyanocobalamin (vitamin B-12) 3,000 mcg cap Take 1 capsule by mouth daily.   ezetimibe (ZETIA) 10 mg tablet Take one tablet by mouth daily.   ferrous sulfate (SLOW RELEASE IRON) 142 mg (45 mg iron) ER tablet Take one tablet  by mouth daily. furosemide (LASIX) 20 mg tablet Take one tablet by mouth daily as needed.   icosapent ethyL (VASCEPA) 1 gram capsule Take two capsules by mouth twice daily with meals.   lancets 33 gauge 33 gauge Use to check blood sugars daily E11.65   Leg Brace misc Bilateral Fitted leg brace and right shoe for foot drop.  Dx: spina bifida.   levETIRAcetam (KEPPRA) 500 mg tablet TAKE 1 TABLET BY MOUTH TWICE DAILY   loperamide (IMODIUM A-D) 2 mg capsule Take one capsule by mouth as Needed for Diarrhea.   losartan (COZAAR) 50 mg tablet Take one tablet by mouth daily.   metFORMIN-XR (GLUCOPHAGE XR) 500 mg extended release tablet Take one tablet by mouth daily with breakfast AND two tablets daily with dinner.   Miscellaneous Medical Supply misc CPAP- auto PAP with heated humdification, 4-20, with mask of patient's choice.  Indications: severe sleep apnea   Miscellaneous Medical Supply misc Shoe lift for right foot.  Dx: spina bifida   multivit-min/folic/vit K/lycop (ONE-A-DAY MEN'S MULTIVITAMIN PO) Take 1 tablet by mouth at bedtime daily.   Ostomy Supplies 1  misc Holl[employer] 1 piece Urostomy bag.  Use 1 bag as needed.   psyllium seed (with sugar) (FIBER PO) Take 1 tablet by mouth twice daily.   rosuvastatin (CRESTOR) 40 mg tablet Take one tablet by mouth at bedtime daily.   sennosides-docusate sodium (SENOKOT-S) 8.6/50 mg tablet Take one tablet by mouth twice daily. Indications: constipation   spironolactone (ALDACTONE) 25 mg tablet Take two tablets by mouth daily. Take with food   tirzepatide (MOUNJARO) 10 mg/0.5 mL injector PEN Inject 0.5 mL under the skin every 7 days.   tirzepatide (MOUNJARO) 12.5 mg/0.5 mL injector PEN Inject 0.5 mL under the skin every 7 days.   triamcinolone acetonide (TRIDERM) 0.1 % topical cream Apply  topically to affected area twice daily.  Patient taking differently: Apply  topically to affected area as Needed.      Education  Education provided: not necessary    Time spent with patient: 20 minutes    Reece Leader, [facility]

## 2022-10-07 ENCOUNTER — Encounter: Admit: 2022-10-07 | Discharge: 2022-10-07 | Payer: MEDICARE

## 2022-10-08 ENCOUNTER — Encounter: Admit: 2022-10-08 | Discharge: 2022-10-08 | Payer: MEDICARE

## 2022-10-08 MED FILL — MOUNJARO 12.5 MG/0.5 ML SC PNIJ: 12.5 mg/0.5 mL | SUBCUTANEOUS | 28 days supply | Qty: 2 | Fill #2 | Status: AC

## 2022-10-11 ENCOUNTER — Encounter: Admit: 2022-10-11 | Discharge: 2022-10-11 | Payer: MEDICARE

## 2022-10-11 NOTE — Progress Notes
General Risk Score 7  HCC Score 4.471  Program criteria met -   Problem List (Diagnosis)    Patient was identified using dual identification of name and date of birth with repeat back    Patient Reports:    1. Doing okay at this time. On way out the door in a few minutes - is taking a math class at [employer].  2. Trying to do better with diet, eating out less. Working to keep blood sugars under control.   3. Medications okay - no questions at this time.     Treatment Goals and Expected Outcomes (patient centered)     1. Good blood sugar control   2. Increase activity as tolerated for strength, balance and mobility  3. Stay as healthy as possible      Barriers to Care:     1. Complex health conditions     Care Plan:     1. Continue to follow plan of care and specialty provider appointments  2. Continue work on [employer], good job on cutting down soda. Lean protein, vegetables and fruit - low carb  3. Continue to increase activity as tolerated for strength, balance and mobility  4. Instructed patient to call care manager any time with questions or concerns.   5. Routing note to PCP    LOV with PCP:    09/01/2022    Upcoming appointments include:    Future Appointments   Date Time Provider Department Center   10/21/2022 10:30 AM Maia Petties, MD [id number] Neurology   11/02/2022  3:00 PM Reece Leader, [facility] [id number] Community   11/23/2022  1:30 PM Hiram Gash, MD KMWIMCL Community   01/04/2023 10:45 AM Wyre, Ranelle Oyster, MD [id number] [place] Exam   01/27/2023  2:00 PM Buechler, Lia Hopping, DO [id number] [facility] Exam       Next review /revise of plan (approximate):    4-6 weeks

## 2022-10-15 ENCOUNTER — Encounter: Admit: 2022-10-15 | Discharge: 2022-10-15 | Payer: MEDICARE

## 2022-10-20 ENCOUNTER — Encounter: Admit: 2022-10-20 | Discharge: 2022-10-20 | Payer: MEDICARE

## 2022-10-21 ENCOUNTER — Encounter: Admit: 2022-10-21 | Discharge: 2022-10-21 | Payer: MEDICARE

## 2022-10-21 ENCOUNTER — Ambulatory Visit: Admit: 2022-10-21 | Discharge: 2022-10-22 | Payer: MEDICARE

## 2022-10-21 DIAGNOSIS — T85618A Breakdown (mechanical) of other specified internal prosthetic devices, implants and grafts, initial encounter: Secondary | ICD-10-CM

## 2022-10-21 DIAGNOSIS — N200 Calculus of kidney: Secondary | ICD-10-CM

## 2022-10-21 DIAGNOSIS — Q0700 Arnold-Chiari syndrome without spina bifida or hydrocephalus: Secondary | ICD-10-CM

## 2022-10-21 DIAGNOSIS — R569 Unspecified convulsions: Secondary | ICD-10-CM

## 2022-10-21 DIAGNOSIS — R339 Retention of urine, unspecified: Secondary | ICD-10-CM

## 2022-10-21 DIAGNOSIS — R51 Headache: Secondary | ICD-10-CM

## 2022-10-21 DIAGNOSIS — Z01810 Encounter for preprocedural cardiovascular examination: Secondary | ICD-10-CM

## 2022-10-21 DIAGNOSIS — N319 Neuromuscular dysfunction of bladder, unspecified: Secondary | ICD-10-CM

## 2022-10-21 DIAGNOSIS — N390 Urinary tract infection, site not specified: Secondary | ICD-10-CM

## 2022-10-21 DIAGNOSIS — G4733 Obstructive sleep apnea (adult) (pediatric): Secondary | ICD-10-CM

## 2022-10-21 DIAGNOSIS — I429 Cardiomyopathy, unspecified: Secondary | ICD-10-CM

## 2022-10-21 DIAGNOSIS — Q059 Spina bifida, unspecified: Secondary | ICD-10-CM

## 2022-10-21 DIAGNOSIS — R000 Tachycardia, unspecified: Secondary | ICD-10-CM

## 2022-10-21 DIAGNOSIS — I10 Essential (primary) hypertension: Secondary | ICD-10-CM

## 2022-10-21 DIAGNOSIS — F988 Other specified behavioral and emotional disorders with onset usually occurring in childhood and adolescence: Secondary | ICD-10-CM

## 2022-10-21 DIAGNOSIS — G919 Hydrocephalus, unspecified: Secondary | ICD-10-CM

## 2022-10-21 MED ORDER — LEVETIRACETAM 500 MG PO TAB
500 mg | ORAL_TABLET | Freq: Two times a day (BID) | ORAL | 3 refills | 90.00000 days | Status: AC
Start: 2022-10-21 — End: ?

## 2022-10-21 NOTE — Patient Instructions
You should not drive unless 6 months free of alteration of consciousness, not work in close proximity of machines with moving parts, swim unsupervised or to work at high places.You should  shower (without accumulation of water) instead of taking a bath if unsupervised.    Contact us if there is an exacerbation of the seizures or any side effects from the antiepileptic medications.

## 2022-10-21 NOTE — Progress Notes
Patient Name: Allen Gill  Age: 32 y.o.  Sex: male    Encounter Date: 10/21/2022  Date of tele-health visit:10/21/22  MRN: [id number]  PCP: Orson Gear A     Current Antiseizure medications: Keppra 500 mg bid    Reason for visit/ chief complaint:  Seizure/epilepsy follow up     INTERVAL SUMMARY        Allen Gill continues to be seizure free. He is currently on levetiracetam 500mg  bid and denies any side effects. He says there is no current problem.    CBC and CMP from 09/01/22 showed increased glucose of 151 and increased ASL of 58. Vitamin B12 was 1,584. Hb A1C 8.8%.    Previously documented HPI (copied and reviewed)     Allen Gill returned for followup regarding spells of losing periods of time.  The last spell was in May 2012.  He has not had any event concerning for his seizures since the last visit.  He is on Keppra and tolerates Keppra well without any side effects.     REVIEW OF SYSTEMS     Review of Systems:   A 10-point ROS is negative with the exception of pertinent positives as noted above in the HPI.     PAST MEDICAL HISTORY     Past Medical History:   Diagnosis Date    ADD (attention deficit disorder)     without hyperactivity    Arnold-Chiari malformation (HCC)     Cardiomyopathy (HCC)     Headache(784.0)     Hydrocephalus (HCC)     Hypertension     Infection of VP shunt     Kidney stones     frequent    Myelomeningocele (HCC)     Neurogenic bladder     OSA (obstructive sleep apnea)     Preop cardiovascular exam 07/30/2016    Seizures (HCC)     blank staring spells in childhood - last absentee seizure 2012    Shunt malfunction     Spina bifida (HCC)     Tachycardia 07/30/2016    Urinary retention 02/19/2016    Added automatically from request for surgery [id number]    Urinary tract infection     frequent       PAST SURGICAL HISTORY     Surgical History:   Procedure Laterality Date    SHUNT CREATION VENTRICULAR PERITONEAL Right 06/14/2014    Performed by Angelia Mould, MD at [facility] OR    SHUNT REVISION VENTRICULAR PERITONEAL Right 04/23/2015    Performed by Angelia Mould, MD at [facility] OR    REMOVAL HARDWARE HEAD: removal of left ventriculopleural shunt Left 05/20/2015    Performed by Angelia Mould, MD at [facility] OR    SHUNT REMOVAL VENTRICULAR PERITONEAL Right 05/23/2015    Performed by Angelia Mould, MD at [facility] OR    SHUNT CREATION VENTRICULAR PERITONEAL Left 06/03/2015    Performed by Angelia Mould, MD at [facility] OR    CYSTOURETHROSCOPY, CYSTOLITHOLAPAXY N/A 08/05/2015    Performed by Pennie Banter, MD at [facility] OR    HOLMIUM LASER ENUCLEATION OF PROSTATE (NO MORCELLATION) N/A 08/19/2015    Performed by Vonna Drafts, MD at [facility] OR    CYSTOSCOPY N/A 08/19/2015    Performed by Vonna Drafts, MD at [facility] OR    CYSTOSCOPY EVACUATION CLOTS N/A 08/29/2015    Performed by Vonna Drafts, MD at [facility] OR    CYSTOSCOPY, URETHERAL DILATION N/A 02/20/2016    Performed by Zachery Dauer,  Mike Gip, MD at [facility] OR    CYSTECTOMY, ILEAL CONDUIT N/A 10/13/2016    Performed by Glennie Isle, MD at [facility] OR    REVISION SHUNT - VENTRICULO-PERITONEAL Left 12/16/2016    Performed by Storm Frisk, MD at [facility] OR    CREATION SHUNT - VENTRICULO-PERITONEAL: Left side. 2 hours, proximal catheter and valve in place. will need distal catheter placed, Dr. Joan Flores to complete. Supine Left 12/22/2016    Performed by Angelia Mould, MD at [facility] OR    PR [facility] PROGRAMMABLE CEREBROSPINAL SHUNT  01/07/2017    PERCUTANEOUS NEPHROSTOLITHOTOMY/ PYELOSTOLITHOTOMY - GREATER THAN 2 CM Left 05/25/2017    Performed by Clarita Crane, MD, [facility] at [facility] OR    CREATION SHUNT - VENTRICULO-PERITONEAL Right 08/04/2017    Performed by Angelia Mould, MD at [facility] OR    PERCUTANEOUS NEPHROSTOLITHOTOMY/ PYELOSTOLITHOTOMY - 2 CM OR LESS Right 06/07/2019    Performed by Beckie Busing, MD at [facility] OR    CYSTOURETHROSCOPY WITH URETEROSCOPY AND/ OR PYELOSCOPY - WITH REMOVAL/ MANIPULATION CALCULUS Right 06/07/2019    Performed by Beckie Busing, MD at [facility] OR    URETERONEOCYSTOSTOMY - VESICO-PSOAS HITCH/ BLADDER FLAP Right 04/28/2022    Performed by Marylen Ponto, MD at [facility] OR    CATHETER IMPLANT/REVISION  12/27/09    distal end of the catheter was revised    CATHETER IMPLANT/REVISION  01/31/10    replacement of ventricular catheter with [employer] framelessstereotaxis catheter    HX ABDOMEN SURGERY      fundoplication    HX ABDOMEN SURGERY  01/2001    Cecostomy    HX BACK SURGERY      repair of spina bifida    HX BRAIN SURGERY  5 months old    Chiari decompression    HX EAR TUBES      HX HERNIA REPAIR      inguinal hernia    HX SURGERY  at 2 weeks    VP Shunt    HX TONSILLECTOMY      HX TRACHEOSTOMY      removed at age 43    SHUNT REVISION  36 months old    SHUNT REVISION  32 years old    SHUNT REVISION  October 2010    [place]    SHUNT REVISION  12/11/09    replacment of valve to acodman hakem adjustablevalve    SHUNT REVISION  02/17/10    left frontal ventriculopleural shunt    VENTRICULOSTOMY  02/03/10    removal of all shunt components and placementofright frontal ventriculostomy       FAMILY HISTORY     Family History   Problem Relation Name Age of Onset    Diabetes Father Allen Gill     Hypertension Father Allen Gill     Other Father Allen Gill         glaucoma    High Cholesterol Maternal Grandfather Allen Gill        SOCIAL HISTORY     Social History     Socioeconomic History    Marital status: Single   Occupational History    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     Employer: [employer]UDENT   Tobacco Use    Smoking status: Never    Smokeless tobacco: Never   Vaping Use    Vaping status: Never Used   Substance and Sexual Activity    Alcohol use: No    Drug use: No    Sexual activity: Not Currently  Partners: Female       Social History     Substance and Sexual Activity   Drug Use No           Social History     Tobacco Use   Smoking Status Never   Smokeless Tobacco Never       MEDICATION     Current Outpatient Medications   Medication Sig Dispense Refill    acetaminophen (TYLENOL EXTRA STRENGTH) 500 mg tablet Take two tablets by mouth every 6 hours as needed for Pain. Max of 4,000 mg of acetaminophen in 24 hours.  Indications: pain      blood sugar diagnostic (ONETOUCH ULTRA TEST) test strip Use to check blood sugars daily. E11.65 100 strip 3    blood-glucose meter (ACCU-CHEK GUIDE GLUCOSE METER) kit Use one strip as directed daily. Diagnosis Code: DM-2 1 each 0    carvediloL (COREG) 6.25 mg tablet Take one tablet by mouth twice daily. Take with food.  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 90 tablet 0    cyanocobalamin (vitamin B-12) 3,000 mcg cap Take 1 capsule by mouth daily.      ezetimibe (ZETIA) 10 mg tablet Take one tablet by mouth daily. 90 tablet 3    ferrous sulfate (SLOW RELEASE IRON) 142 mg (45 mg iron) ER tablet Take one tablet by mouth daily. 90 tablet 1    furosemide (LASIX) 20 mg tablet Take one tablet by mouth daily as needed. 90 tablet 1    icosapent ethyL (VASCEPA) 1 gram capsule Take two capsules by mouth twice daily with meals.      lancets 33 gauge 33 gauge Use to check blood sugars daily E11.65 100 each 3    Leg Brace misc Bilateral Fitted leg brace and right shoe for foot drop.  Dx: spina bifida. 1 each 0    levETIRAcetam (KEPPRA) 500 mg tablet TAKE 1 TABLET BY MOUTH TWICE DAILY 180 tablet 3    loperamide (IMODIUM A-D) 2 mg capsule Take one capsule by mouth as Needed for Diarrhea.      losartan (COZAAR) 50 mg tablet Take one tablet by mouth daily. 90 tablet 3    metFORMIN-XR (GLUCOPHAGE XR) 500 mg extended release tablet Take one tablet by mouth daily with breakfast AND two tablets daily with dinner. 270 tablet 3    Miscellaneous [employer] Supply misc CPAP- auto PAP with heated humdification, 4-20, with mask of patient's choice.  Indications: severe sleep apnea 1 each 0    Miscellaneous [employer] Supply misc Shoe lift for right foot.  Dx: spina bifida 1 each 0 multivit-min/folic/vit K/lycop (ONE-A-DAY MEN'S MULTIVITAMIN PO) Take 1 tablet by mouth at bedtime daily.      Ostomy Supplies 1  misc [employer] 1 piece Urostomy bag.  Use 1 bag as needed. 10 each 10    psyllium seed (with sugar) (FIBER PO) Take 1 tablet by mouth twice daily.      rosuvastatin (CRESTOR) 40 mg tablet Take one tablet by mouth at bedtime daily. 90 tablet 3    sennosides-docusate sodium (SENOKOT-S) 8.6/50 mg tablet Take one tablet by mouth twice daily. Indications: constipation      spironolactone (ALDACTONE) 25 mg tablet Take two tablets by mouth daily. Take with food      tirzepatide (MOUNJARO) 12.5 mg/0.5 mL injector PEN Inject 0.5 mL under the skin every 7  days. 2 mL 1    triamcinolone acetonide (TRIDERM) 0.1 % topical cream Apply  topically to affected area twice daily. (Patient taking differently: Apply  topically to affected area as Needed.) 45 g 0     No current facility-administered medications for this visit.       ALLERGIES     Allergies   Allergen Reactions    Latex RASH and SHORTNESS OF BREATH    Amoxicillin RASH and STOMACH UPSET     08/03/17 discussed this w/ patient. Amox/clav in 2014. He certainly took it.  Notes from that time indicate diarrhea. Discussed this with him on 03/13/18 and he stated that he'd taken amox 3 times in past and each time had HA, debilitating fatigue and diarrhea. And maybe rash. At this time he was tolerating IV ampicillin. Manya Silvas, MD 03/13/18  Update Luchi MD, 11/28/18: He got IV ampicillin at [facility] 2/29 to 03/15/18 w/o rash or SE. He was started on Augmentin 05/08/19 without significant SE    Ceclor [Cefaclor] HIVES     Pt has tolerated ancef, keflex, ceftriaxone and cefepime (many prescriptions for these in med review in [place].    Clindamycin HIVES    Zosyn [Piperacillin-Tazobactam] HIVES       PHYSICAL EXAMINATION         PHYSICAL EXAM:    Vitals:    10/21/22 1041   BP: 113/75   Pulse: 88   SpO2: 98%   Weight: 117.9 kg (260 lb)   Height: 157.5 cm (5' 2)       Patient was alert awake and oriented. Cranial nerve examination showed spastic dysarthria and dysphonia.  Face was symmetric. He was able to use his arms normal. 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    Impression:  Localization-related epilepsy   S/p VP shunt      Plan:    -Continue AED regimen as follows:  --Keppra 500 mg BID   -Seizure precautions: No driving for 6 months since last seizure this is [place] state law. No climbing ladders, operating heavy machinery, cooking over a grill or stove, swimming or tub baths alone, or any activity during which acute and unexpected loss of consciousness could lead to injury to self or others.    RTC: 1 year         Staff name:  Maia Petties, MD Date:  10/21/2022

## 2022-10-22 DIAGNOSIS — G40209 Localization-related (focal) (partial) symptomatic epilepsy and epileptic syndromes with complex partial seizures, not intractable, without status epilepticus: Secondary | ICD-10-CM

## 2022-10-25 ENCOUNTER — Encounter: Admit: 2022-10-25 | Discharge: 2022-10-25 | Payer: MEDICARE

## 2022-10-26 ENCOUNTER — Encounter: Admit: 2022-10-26 | Discharge: 2022-10-26 | Payer: MEDICARE

## 2022-10-26 MED FILL — ONETOUCH ULTRA TEST MISC STRP: 50 days supply | Qty: 50 | Fill #7 | Status: CP

## 2022-10-28 ENCOUNTER — Ambulatory Visit: Admit: 2022-10-28 | Discharge: 2022-10-28 | Payer: MEDICARE

## 2022-10-28 ENCOUNTER — Encounter: Admit: 2022-10-28 | Discharge: 2022-10-28 | Payer: MEDICARE

## 2022-10-28 DIAGNOSIS — G919 Hydrocephalus, unspecified: Secondary | ICD-10-CM

## 2022-10-28 DIAGNOSIS — I10 Essential (primary) hypertension: Secondary | ICD-10-CM

## 2022-10-28 DIAGNOSIS — I429 Cardiomyopathy, unspecified: Secondary | ICD-10-CM

## 2022-10-28 DIAGNOSIS — Q0700 Arnold-Chiari syndrome without spina bifida or hydrocephalus: Secondary | ICD-10-CM

## 2022-10-28 DIAGNOSIS — R339 Retention of urine, unspecified: Secondary | ICD-10-CM

## 2022-10-28 DIAGNOSIS — R51 Headache: Secondary | ICD-10-CM

## 2022-10-28 DIAGNOSIS — Z01810 Encounter for preprocedural cardiovascular examination: Secondary | ICD-10-CM

## 2022-10-28 DIAGNOSIS — Q059 Spina bifida, unspecified: Secondary | ICD-10-CM

## 2022-10-28 DIAGNOSIS — F988 Other specified behavioral and emotional disorders with onset usually occurring in childhood and adolescence: Secondary | ICD-10-CM

## 2022-10-28 DIAGNOSIS — N390 Urinary tract infection, site not specified: Secondary | ICD-10-CM

## 2022-10-28 DIAGNOSIS — T85618A Breakdown (mechanical) of other specified internal prosthetic devices, implants and grafts, initial encounter: Secondary | ICD-10-CM

## 2022-10-28 DIAGNOSIS — R000 Tachycardia, unspecified: Secondary | ICD-10-CM

## 2022-10-28 DIAGNOSIS — N200 Calculus of kidney: Secondary | ICD-10-CM

## 2022-10-28 DIAGNOSIS — N319 Neuromuscular dysfunction of bladder, unspecified: Secondary | ICD-10-CM

## 2022-10-28 DIAGNOSIS — R569 Unspecified convulsions: Secondary | ICD-10-CM

## 2022-10-28 DIAGNOSIS — G4733 Obstructive sleep apnea (adult) (pediatric): Secondary | ICD-10-CM

## 2022-11-02 ENCOUNTER — Encounter: Admit: 2022-11-02 | Discharge: 2022-11-02 | Payer: MEDICARE

## 2022-11-02 ENCOUNTER — Ambulatory Visit: Admit: 2022-11-02 | Discharge: 2022-11-02 | Payer: MEDICARE

## 2022-11-02 DIAGNOSIS — E1165 Type 2 diabetes mellitus with hyperglycemia: Secondary | ICD-10-CM

## 2022-11-02 MED ORDER — MOUNJARO 15 MG/0.5 ML SC PNIJ
15 mg | SUBCUTANEOUS | 5 refills | Status: AC
Start: 2022-11-02 — End: ?
  Filled 2022-11-12: qty 2, 28d supply, fill #1

## 2022-11-02 NOTE — Progress Notes
A follow-up comprehensive medication management visit was completed today via telephone.    Referral reason: Diabetes Management  Referring provider: Orson Gear, MD    Assessment & Plan     Diabetes  Patient's diabetes is uncontrolled as reflected by home blood sugars and A1C. Their goal glucose is pre-prandial 80-130 and post-prandial <180. Their most recent A1C was 8.8% on 09/01/2022 with a goal of < 7%    Reported fasting blood sugar not at goal, average 140.7 mg/dL, which is higher than last average of 131 mg/dL. Will increase Mounjaro dose at this time for additional blood sugar lowering. If still not controlled, then will need to add a 3rd agent. Seeing PCP on 11/12 and will be due for repeat A1C.     Plan  - INCREASE Mounjaro 15 mg once a week on Tuesdays   - Continue metformin XR 500 mg AM and 1000 mg PM    Follow-up  The patient will continue to follow up with the pharmacist. Return to pharmacist in 4 weeks via telephone. The return visit was scheduled during today's visit.    Subjective & Objective   At the last visit, the patient was instructed to increase Mounjaro 12.5 mg once a week. The patient states they made all of the recommended changes.     Taking Mounjaro 12.5 mg. Took 8th dose today. Still having some loose stools the first 24 hours after taking dose but doing better.   Checking fasting blood sugars at 8 am   Thinks he's been eating more the last week    Eating 2-3 meals a day. Lunch is usually 12:45 - dinner is around 5-5:30     Diabetes    HPI  Current diabetes medication regimen:   - metformin XR 500 mg in the morning and 1000 mg in the evening   - Mounjaro 12.5 mg once a week on Tuesdays - has taken 4 doses    Previous medications for diabetes:   - Mounjaro (10 mg) - switched to Ozempic due to shortage  - Trulicity - switched to [employer]   - metformin IR - diarrhea   - Januvia - switched to Trulicity     Blood Glucose  Monitoring times: fasting  The baseline HbA1C was 9.4 on 07/07/2020.    Patient reported readings:   fasting  13-Oct 129  14-Oct 150  15-Oct 121  16-Oct 141  17-Oct 138  18-Oct 147  19-Oct 132  20-Oct 149  21-Oct 132  22-Oct 168  avg 140.7  avg 131    Diet and Exercise  Number of meals per day: 2  Breakfast: doesn't usually eat breakfast  Lunch: chicken salad  Dinner: mac and cheese, chicken salad, veggies, usually fairly carb heavy   Snacks: sometimes will snack throughout the day - popcorn, cookies, rice crispy, potato chips   Drinks: if not going out and wants something with his meal then will drink soda, but when he goes out will have water  Last meal of the day is usually 5 pm     Comorbidities  ASCVD condition(s): none  CKD: no  Heart failure: no    Obesity: yes    Health Maintenance  Aspirin utilization: Patient is not taking aspirin due to the following reason(s): not indicated.  Statin utilization: Patient is taking a statin.  Hypertension: Patient has hypertension, which is not controlled.    Primary Care - Labs   Basic Metabolic Profile    Lab Results   Component  Value Date/Time    NA 139 09/01/2022 03:14 PM    K 4.3 09/01/2022 03:14 PM    CA 9.4 09/01/2022 03:14 PM    CL 100 09/01/2022 03:14 PM    CO2 28 09/01/2022 03:14 PM    GAP 11 09/01/2022 03:14 PM    EGFR1 >60 09/01/2022 03:14 PM    Lab Results   Component Value Date/Time    BUN 14 09/01/2022 03:14 PM    CR 0.78 09/01/2022 03:14 PM    GLU 151 (H) 09/01/2022 03:14 PM    GLU 113 (H) 10/29/2020 02:53 PM         Lab Draw:  Lab Results   Component Value Date/Time    HGBA1C 8.8 (H) 09/01/2022 03:14 PM    HGBA1C 8.0 (H) 10/28/2021 02:42 PM    HGBA1C 8.6 (H) 04/29/2021 12:32 PM     POC:  Lab Results   Component Value Date/Time    A1C 8.3 (A) 04/27/2022 12:00 AM        No results found for: MCALB24   Microalbumin/CR ratio Urine   Date Value Ref Range Status   10/28/2021 54.41 (H) <30 ug/mg Final     Comment:     NOTE NEW REFERENCE RANGES     Microalbumin, Random   Date Value Ref Range Status   10/28/2021 50.6 MCG/ML Final        Lab Results   Component Value Date    CHOL 153 01/20/2022    TRIG 229 (H) 01/20/2022    HDL 33 (L) 01/20/2022    LDL 100 (H) 01/20/2022    VLDL 46 01/20/2022    NONHDLCHOL 120 01/20/2022    CHOLHDLC 6.9 (H) 10/29/2020         BP Readings from Last 1 Encounters:   10/21/22 113/75      Medication History  Medication history was not completed because previously completed (follow-up visit).    Adverse Drug Reactions  Adverse drug reactions were reviewed with the patient.    Significant adverse drug reaction(s) were not identified.    Side effect(s) were not reported.    The patient is filling their medications through 2 pharmacies due to the following reason(s): patient preference. Patient is currently using [facility] pharmacy. Patient is currently using [facility] based on patient preference.    Home Medications    Medication Sig   acetaminophen (TYLENOL EXTRA STRENGTH) 500 mg tablet Take two tablets by mouth every 6 hours as needed for Pain. Max of 4,000 mg of acetaminophen in 24 hours.  Indications: pain   blood sugar diagnostic (ONETOUCH ULTRA TEST) test strip Use to check blood sugars daily. E11.65   blood-glucose meter (ACCU-CHEK GUIDE GLUCOSE METER) kit Use one strip as directed daily. Diagnosis Code: DM-2   carvediloL (COREG) 6.25 mg tablet Take one tablet by mouth twice daily. Take with food.  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   cyanocobalamin (vitamin B-12) 3,000 mcg cap Take 1 capsule by mouth daily.   ezetimibe (ZETIA) 10 mg tablet Take one tablet by mouth daily.   ferrous sulfate (SLOW RELEASE IRON) 142 mg (45 mg iron) ER tablet Take one tablet by mouth daily.   furosemide (LASIX) 20 mg tablet Take one tablet by mouth daily as needed.   icosapent ethyL (VASCEPA) 1 gram capsule Take two capsules by mouth twice daily  with meals.   lancets 33 gauge 33 gauge Use to check blood sugars daily E11.65   Leg Brace misc Bilateral Fitted leg brace and right shoe for foot drop.  Dx: spina bifida.   levETIRAcetam (KEPPRA) 500 mg tablet Take one tablet by mouth twice daily.   loperamide (IMODIUM A-D) 2 mg capsule Take one capsule by mouth as Needed for Diarrhea.   losartan (COZAAR) 50 mg tablet Take one tablet by mouth daily.   metFORMIN-XR (GLUCOPHAGE XR) 500 mg extended release tablet Take one tablet by mouth daily with breakfast AND two tablets daily with dinner.   Miscellaneous [employer] misc CPAP- auto PAP with heated humdification, 4-20, with mask of patient's choice.  Indications: severe sleep apnea   Miscellaneous Medical Supply misc Shoe lift for right foot.  Dx: spina bifida   multivit-min/folic/vit K/lycop (ONE-A-DAY MEN'S MULTIVITAMIN PO) Take 1 tablet by mouth at bedtime daily.   Ostomy Supplies 1  misc [employer] 1 piece Urostomy bag.  Use 1 bag as needed.   psyllium seed (with sugar) (FIBER PO) Take 1 tablet by mouth twice daily.   rosuvastatin (CRESTOR) 40 mg tablet Take one tablet by mouth at bedtime daily.   sennosides-docusate sodium (SENOKOT-S) 8.6/50 mg tablet Take one tablet by mouth twice daily. Indications: constipation   spironolactone (ALDACTONE) 25 mg tablet Take two tablets by mouth daily. Take with food   tirzepatide (MOUNJARO) 12.5 mg/0.5 mL injector PEN Inject 0.5 mL under the skin every 7 days.   triamcinolone acetonide (TRIDERM) 0.1 % topical cream Apply  topically to affected area twice daily.  Patient taking differently: Apply  topically to affected area as Needed.      Education  Education provided: not necessary    Time spent with patient: 25 minutes    Reece Leader, [facility]

## 2022-11-08 ENCOUNTER — Encounter: Admit: 2022-11-08 | Discharge: 2022-11-08 | Payer: MEDICARE

## 2022-11-09 ENCOUNTER — Encounter: Admit: 2022-11-09 | Discharge: 2022-11-09 | Payer: MEDICARE

## 2022-11-11 ENCOUNTER — Encounter: Admit: 2022-11-11 | Discharge: 2022-11-11 | Payer: MEDICARE

## 2022-11-23 ENCOUNTER — Encounter: Admit: 2022-11-23 | Discharge: 2022-11-23 | Payer: MEDICARE

## 2022-11-24 ENCOUNTER — Encounter: Admit: 2022-11-24 | Discharge: 2022-11-24 | Payer: MEDICARE

## 2022-11-30 ENCOUNTER — Encounter: Admit: 2022-11-30 | Discharge: 2022-11-30 | Payer: MEDICARE

## 2022-11-30 ENCOUNTER — Ambulatory Visit: Admit: 2022-11-30 | Discharge: 2022-12-01 | Payer: MEDICARE

## 2022-11-30 DIAGNOSIS — E1165 Type 2 diabetes mellitus with hyperglycemia: Secondary | ICD-10-CM

## 2022-11-30 MED ORDER — LANCETS 33 GAUGE MISC MISC
3 refills | Status: CN
Start: 2022-11-30 — End: ?

## 2022-11-30 MED ORDER — LANCETS 33 GAUGE MISC MISC
3 refills | 30.00000 days | Status: AC
Start: 2022-11-30 — End: ?
  Filled 2022-12-01: qty 100, 90d supply, fill #1

## 2022-11-30 NOTE — Progress Notes
A follow-up comprehensive medication management visit was completed today via telephone.    Referral reason: Diabetes Management  Referring provider: Orson Gear, MD    Assessment & Plan     Diabetes  Patient's diabetes is uncontrolled as reflected by home blood sugars and A1C. Their goal glucose is pre-prandial 80-130 and post-prandial <180. Their most recent A1C was 8.8% on 09/01/2022 with a goal of < 7%    Was not able to tolerate Mounjaro dose increase due to having severe diarrhea. Will decrease dose back down to 12.5 mg. His average fasting blood sugars have increased, recommended adding a third agent for additional blood sugar lowering however patient would like to hold off at this time and work on dietary changes and increasing physical activity first.     Plan  - DECREASE Mounjaro 12.5 mg once a week - unable to tolerate 15 mg   - Continue metformin XR 500 mg AM and 1000 mg PM  - Start checking blood sugar 2 hours post dinner    Follow-up  The patient will continue to follow up with the pharmacist. Return to pharmacist in 4 weeks via telephone. The return visit was scheduled during today's visit.    Subjective & Objective   At the last visit, the patient was instructed to decrease to Mounjaro 12.5 mg once a week. The patient states they made none of the recommended changes.     Patient was not able to tolerate Mounjaro 15 mg increase so was decreased back down to 12.5 mg. He was having severe diarrhea after taking one dose of Mounjaro 15 mg. Was taking loperamide daily but hasn't needed to take any since Saturday.   Last dose of mounjaro 15 mg was on 11/4, has not taken mounjaro since. Hasn't received 12.5 mg dose from pharmacy yet.  Thinks he needs to rein in his eating habits since he has been eating more with family around.  Hasn't been very physically active, hasn't been in the gym, would like to start going back with his brother    Eating 2-3 meals a day. Lunch is usually 12:45 - dinner is around 5-5:30     Diabetes    HPI  Current diabetes medication regimen:   - metformin XR 500 mg in the morning and 1000 mg in the evening   - Mounjaro 15 mg once a week on Tuesdays - last dose on 11/4    Previous medications for diabetes:   - Mounjaro (10 mg) - switched to Ozempic due to shortage  - Trulicity - switched to [employer]   - metformin IR - diarrhea   - Januvia - switched to Trulicity     Blood Glucose  Monitoring times: fasting  The baseline HbA1C was 9.4 on 07/07/2020.    Patient reported readings:   fasting  1-Nov 147  2-Nov 148  3-Nov 156  4-Nov 203  5-Nov 144  6-Nov 152  7-Nov 141  8-Nov 175  9-Nov 124  10-Nov 134  11-Nov 140  12-Nov 120  13-Nov 169  14-Nov 153  15-Nov 163  16-Nov 212  17-Nov 148  18-Nov 187  19-Nov 150  avg 156  Pre-avg 140.7    Diet and Exercise  Number of meals per day: 2  Breakfast: doesn't usually eat breakfast  Lunch: chicken salad  Dinner: mac and cheese, chicken salad, veggies, usually fairly carb heavy   Snacks: sometimes will snack throughout the day - popcorn, cookies, rice crispy, potato chips   Drinks:  if not going out and wants something with his meal then will drink soda, but when he goes out will have water  Last meal of the day is usually 5 pm     Comorbidities  ASCVD condition(s): none  CKD: no  Heart failure: no    Obesity: yes    Health Maintenance  Aspirin utilization: Patient is not taking aspirin due to the following reason(s): not indicated.  Statin utilization: Patient is taking a statin.  Hypertension: Patient has hypertension, which is not controlled.    Primary Care - Labs   Basic Metabolic Profile    Lab Results   Component Value Date/Time    NA 139 09/01/2022 03:14 PM    K 4.3 09/01/2022 03:14 PM    CA 9.4 09/01/2022 03:14 PM    CL 100 09/01/2022 03:14 PM    CO2 28 09/01/2022 03:14 PM    GAP 11 09/01/2022 03:14 PM    EGFR1 >60 09/01/2022 03:14 PM    Lab Results   Component Value Date/Time    BUN 14 09/01/2022 03:14 PM    CR 0.78 09/01/2022 03:14 PM    GLU 151 (H) 09/01/2022 03:14 PM         Lab Draw:  Lab Results   Component Value Date/Time    HGBA1C 8.8 (H) 09/01/2022 03:14 PM    HGBA1C 8.0 (H) 10/28/2021 02:42 PM    HGBA1C 8.6 (H) 04/29/2021 12:32 PM     POC:  Lab Results   Component Value Date/Time    A1C 8.3 (A) 04/27/2022 12:00 AM        No results found for: MCALB24   Microalbumin/CR ratio Urine   Date Value Ref Range Status   10/28/2021 54.41 (H) <30 ug/mg Final     Comment:     NOTE NEW REFERENCE RANGES     Microalbumin, Random   Date Value Ref Range Status   10/28/2021 50.6 MCG/ML Final        Lab Results   Component Value Date    CHOL 153 01/20/2022    TRIG 229 (H) 01/20/2022    HDL 33 (L) 01/20/2022    LDL 100 (H) 01/20/2022    VLDL 46 01/20/2022    NONHDLCHOL 120 01/20/2022    CHOLHDLC 6.9 (H) 10/29/2020         BP Readings from Last 1 Encounters:   10/21/22 113/75      Medication History  Medication history was not completed because previously completed (follow-up visit).    Adverse Drug Reactions  Adverse drug reactions were reviewed with the patient.    Significant adverse drug reaction(s) were not identified.    Side effect(s) were not reported.    The patient is filling their medications through 2 pharmacies due to the following reason(s): patient preference. Patient is [facility] using [facility] pharmacy. Patient is currently using [facility] based on patient preference.    Home Medications    Medication Sig   acetaminophen (TYLENOL EXTRA STRENGTH) 500 mg tablet Take two tablets by mouth every 6 hours as needed for Pain. Max of 4,000 mg of acetaminophen in 24 hours.  Indications: pain   blood sugar diagnostic (ONETOUCH ULTRA TEST) test strip Use to check blood sugars daily. E11.65   blood-glucose meter (ACCU-CHEK GUIDE GLUCOSE METER) kit Use one strip as directed daily. Diagnosis Code: DM-2   carvediloL (COREG) 6.25 mg tablet Take one tablet by mouth twice daily. Take with food.  Indications:  heart failure with reduced ejection fraction due to dilated cardiomyopathy   cholecalciferol(+) (VITAMIN D3) 2,000 unit tablet Take one tablet by mouth daily.   cyanocobalamin (vitamin B-12) 3,000 mcg cap Take 1 capsule by mouth daily.   ezetimibe (ZETIA) 10 mg tablet Take one tablet by mouth daily.   ferrous sulfate (SLOW RELEASE IRON) 142 mg (45 mg iron) ER tablet Take one tablet by mouth daily.   furosemide (LASIX) 20 mg tablet Take one tablet by mouth daily as needed.   icosapent ethyL (VASCEPA) 1 gram capsule Take two capsules by mouth twice daily with meals.   lancets 33 gauge 33 gauge Use to check blood sugars daily E11.65   Leg Brace misc Bilateral Fitted leg brace and right shoe for foot drop.  Dx: spina bifida.   levETIRAcetam (KEPPRA) 500 mg tablet Take one tablet by mouth twice daily.   loperamide (IMODIUM A-D) 2 mg capsule Take one capsule by mouth as Needed for Diarrhea.   losartan (COZAAR) 50 mg tablet Take one tablet by mouth daily.   metFORMIN-XR (GLUCOPHAGE XR) 500 mg extended release tablet Take one tablet by mouth daily with breakfast AND two tablets daily with dinner.   [employer] misc CPAP- auto PAP with heated humdification, 4-20, with mask of patient's choice.  Indications: severe sleep apnea   Miscellaneous [employer] misc Shoe lift for right foot.  Dx: spina bifida   multivit-min/folic/vit K/lycop (ONE-A-DAY MEN'S MULTIVITAMIN PO) Take 1 tablet by mouth at bedtime daily.   Ostomy Supplies 1  misc [employer] 1 piece Urostomy bag.  Use 1 bag as needed.   psyllium seed (with sugar) (FIBER PO) Take 1 tablet by mouth twice daily.   rosuvastatin (CRESTOR) 40 mg tablet Take one tablet by mouth at bedtime daily.   sennosides-docusate sodium (SENOKOT-S) 8.6/50 mg tablet Take one tablet by mouth twice daily. Indications: constipation   spironolactone (ALDACTONE) 25 mg tablet Take two tablets by mouth daily. Take with food   tirzepatide (MOUNJARO) 12.5 mg/0.5 mL injector PEN Inject 0.5 mL under the skin every 7 days.   triamcinolone acetonide (TRIDERM) 0.1 % topical cream Apply  topically to affected area twice daily.  Patient taking differently: Apply  topically to affected area as Needed.      Education  Education provided: not necessary    Time spent with patient: 25 minutes    Reece Leader, [facility]

## 2022-12-01 ENCOUNTER — Encounter: Admit: 2022-12-01 | Discharge: 2022-12-01 | Payer: MEDICARE

## 2022-12-02 ENCOUNTER — Ambulatory Visit: Admit: 2022-12-02 | Discharge: 2022-12-02 | Payer: MEDICARE

## 2022-12-02 ENCOUNTER — Encounter: Admit: 2022-12-02 | Discharge: 2022-12-02 | Payer: MEDICARE

## 2022-12-02 DIAGNOSIS — Z0000 Encounter for general adult medical examination without abnormal findings: Secondary | ICD-10-CM

## 2022-12-02 DIAGNOSIS — E1165 Type 2 diabetes mellitus with hyperglycemia: Secondary | ICD-10-CM

## 2022-12-02 DIAGNOSIS — I10 Essential (primary) hypertension: Secondary | ICD-10-CM

## 2022-12-02 NOTE — Progress Notes
Patient came into office for Flu  and COVID  injection.  Patient tolerated well. Injection was given in the Left deltoid  and Right deltoid .  Patient signed Consent and was given the VIS.  Pt able to ambulate out of office on their own.  Vaccine was documented in the patients chart.

## 2022-12-02 NOTE — Progress Notes
Date of Service: 12/02/2022    Allen Gill is a 32 y.o. male.  DOB: 1990-03-31  MRN: [id number]     Subjective:            He presents today for an Annual Medicare Wellness visit.    he was last seen by me 09/01/2022.  Patient states overall he is doing well.  He was meeting with the pharmacist and has returned to [facility] 12-1/2 mg because 50 mg was giving him diarrhea.  Patient is not exercising yet but is considering doing this with his brother soon.  Patient states he sleeps well.  He denies any anxiety or depression.  He would like to lose weight and is considering eating more healthy.              Chief Complaint   Patient presents with    Annual Wellness Visit     LABS DRAWN- available?  Mounjaro dose- spoke to pharmacist, plan of care       Past Medical History:   Diagnosis Date    ADD (attention deficit disorder)     without hyperactivity    Arnold-Chiari malformation (HCC)     Cardiomyopathy (HCC)     Headache(784.0)     Hydrocephalus (HCC)     Hypertension     Infection of VP shunt     Kidney stones     frequent    Myelomeningocele (HCC)     Neurogenic bladder     OSA (obstructive sleep apnea)     Preop cardiovascular exam 07/30/2016    Seizures (HCC)     blank staring spells in childhood - last absentee seizure 2012    Shunt malfunction     Spina bifida (HCC)     Tachycardia 07/30/2016    Urinary retention 02/19/2016    Added automatically from request for surgery [id number]    Urinary tract infection     frequent     Surgical History:   Procedure Laterality Date    SHUNT CREATION VENTRICULAR PERITONEAL Right 06/14/2014    Performed by Angelia Mould, MD at [facility] OR    SHUNT REVISION VENTRICULAR PERITONEAL Right 04/23/2015    Performed by Angelia Mould, MD at [facility] OR    REMOVAL HARDWARE HEAD: removal of left ventriculopleural shunt Left 05/20/2015    Performed by Angelia Mould, MD at [facility] OR    SHUNT REMOVAL VENTRICULAR PERITONEAL Right 05/23/2015    Performed by Angelia Mould, MD at [facility] OR    SHUNT CREATION VENTRICULAR PERITONEAL Left 06/03/2015    Performed by Angelia Mould, MD at [facility] OR    CYSTOURETHROSCOPY, CYSTOLITHOLAPAXY N/A 08/05/2015    Performed by Pennie Banter, MD at [facility] OR    HOLMIUM LASER ENUCLEATION OF PROSTATE (NO MORCELLATION) N/A 08/19/2015    Performed by Vonna Drafts, MD at [facility] OR    CYSTOSCOPY N/A 08/19/2015    Performed by Vonna Drafts, MD at [facility] OR    CYSTOSCOPY EVACUATION CLOTS N/A 08/29/2015    Performed by Vonna Drafts, MD at [facility] OR    CYSTOSCOPY, URETHERAL DILATION N/A 02/20/2016    Performed by Vonna Drafts, MD at [facility] OR    CYSTECTOMY, ILEAL CONDUIT N/A 10/13/2016    Performed by Glennie Isle, MD at [facility] OR    REVISION SHUNT - VENTRICULO-PERITONEAL Left 12/16/2016    Performed by Storm Frisk, MD at [facility] OR    CREATION SHUNT - VENTRICULO-PERITONEAL: Left side. 2 hours, proximal catheter and  valve in place. will need distal catheter placed, Dr. Joan Flores to complete. Supine Left 12/22/2016    Performed by Angelia Mould, MD at [facility] OR    [facility] PROGRAMMABLE CEREBROSPINAL SHUNT  01/07/2017    PERCUTANEOUS NEPHROSTOLITHOTOMY/ PYELOSTOLITHOTOMY - GREATER THAN 2 CM Left 05/25/2017    Performed by Clarita Crane, MD, [facility] at [facility] OR    CREATION SHUNT - VENTRICULO-PERITONEAL Right 08/04/2017    Performed by Angelia Mould, MD at [facility] OR    PERCUTANEOUS NEPHROSTOLITHOTOMY/ PYELOSTOLITHOTOMY - 2 CM OR LESS Right 06/07/2019    Performed by Beckie Busing, MD at [facility] OR    CYSTOURETHROSCOPY WITH URETEROSCOPY AND/ OR PYELOSCOPY - WITH REMOVAL/ MANIPULATION CALCULUS Right 06/07/2019    Performed by Beckie Busing, MD at [facility] OR    URETERONEOCYSTOSTOMY - VESICO-PSOAS HITCH/ BLADDER FLAP Right 04/28/2022    Performed by Marylen Ponto, MD at [facility] OR    CATHETER IMPLANT/REVISION  12/27/09    distal end of the catheter was revised    CATHETER IMPLANT/REVISION  01/31/10    replacement of ventricular catheter with [employer] framelessstereotaxis catheter    HX ABDOMEN SURGERY      fundoplication    HX ABDOMEN SURGERY  01/2001 Cecostomy    HX BACK SURGERY      repair of spina bifida    HX BRAIN SURGERY  5 months old    Chiari decompression    HX EAR TUBES      HX HERNIA REPAIR      inguinal hernia    HX SURGERY  at 2 weeks    VP Shunt    HX TONSILLECTOMY      HX TRACHEOSTOMY      removed at age 31    SHUNT REVISION  12 months old    SHUNT REVISION  32 years old    SHUNT REVISION  October 2010    [place]    SHUNT REVISION  12/11/09    replacment of valve to acodman hakem adjustablevalve    SHUNT REVISION  02/17/10    left frontal ventriculopleural shunt    VENTRICULOSTOMY  02/03/10    removal of all shunt components and placementofright frontal ventriculostomy     Family History   Problem Relation Name Age of Onset    Diabetes Father Aram Cada     Hypertension Father Dorman Clinkscales     Other Father Daquavious Deluke         glaucoma    High Cholesterol Maternal Grandfather Remmel Conyers      Social History     Socioeconomic History    Marital status: Single   Occupational History    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    A Health Risk assessment was performed by the patient today, reviewed with the patient.      Health Risk Assessment Questionnaire  Current Care  List of Providers you have seen in the last two years: See charts  Are you receiving home health?: No  During the past 4 weeks, how would  you rate your health in general?: Good    Outside Care  Since your last PCP visit, have you received care outside of [facility]?: No        Physical Activity  Do you exercise or are you physically active?: (!) No          Diet  In the past month, were you worried whether your food would run out before you or your family had money to buy more?: No  In the past 7 days, how many times did you eat fast food or junk food or pizza?: (!) 3 or more times  In the past 7 days, how many servings of fruits or vegetables did you eat each day?: (!) 2-3  In the past 7 days, how many sodas and sugar sweetened drinks (regular, not diet) did you drink each day?: 1    Smoke/Tobacco Use  Are you currently a smoker?: No      Alcohol Use  Do you drink alcohol?: No          Depression Screen  Little interest or pleasure in doing things: (Patient-Rptd) Not at all  Feeling down, depressed or hopeless: (Patient-Rptd) Not at all    Patient Scores:  PHQ-2: PHQ-2 Score: (Patient-Rptd) 0 (11/25/2022  8:43 AM)    PHQ-9: No data recorded  Interventions:  PHQ-2: PHQ-2 Score less than 3: No follow-up or recommendations are necessary at this time (10/21/2022 10:43 AM)    Depression Interventions PHQ-2/9: No data recorded      Pain  How would you rate your pain today?: No pain    Ambulation  Do you use any assistive devices for ambulation?: No      Fall Risk  Does it take you longer than 30 seconds to get up and out of a chair?: No  Have you fallen in the past year?: No      Motor Vehicle Safety  Do you fasten your seat belt when you are in the car?: Yes    Sun Exposure  Do you protect yourself from the sun? For example, wear sunscreen when outside.: (!) No    Hearing Loss  Do you have trouble hearing the television or radio when others do not?: No  Do you have to strain or struggle to hear/understand conversation?: No  Do you use hearing aids?: No    Cognitive Impairment  During the past 12 months, have you experienced confusion or memory loss that is happening more often or is getting worse?: No    Functional Screen  Do you live alone?: Yes  Do you live at: Home  Can you drive your own car or travel alone by bus or taxi?: Yes  Can you shop for groceries or clothes without help?: Yes  Can you prepare your own meals?: Yes  Can you do your own housework without help?: Yes  Can you handle your own money without help?: Yes  Do you need help eating, bathing, dressing, or getting around your home?: No  Do you feel safe?: Yes  Does anyone at home hurt you, hit you, or threaten you?: No  Have you ever been the victim of abuse?: No    Home Safety  Does your home have grab bars in the bathroom?: Yes  Does your home have hand rails on stairs and steps?: No stairs at home  Does your home have functioning smoke alarms?: Yes    Advance Directive  Do you have a  living will or Advance Directive?: Yes      Dental Screen  Have you had an exam by your dentist in the last year?: Yes    Vision Screen  Do you have diabetes?: (!) Yes  When was your last eye exam?: 10/14/22  Eye Doctor Name?: Dr. Lorna Few  Eye Doctor Facility?: [facility]    In the last 12 months, has your utility company shut off your service for not paying your bills?: No (05/17/2022  2:00 PM)  Are you worried that in the next 2 months, you may not have stable housing?: No (05/17/2022  2:00 PM)  Are you afraid that you may be hurt in your home by someone you know?: (Patient-Rptd) No (04/20/2022  8:43 AM)  Are you afraid you might be hurt in your apartment building or neighborhood?: (Patient-Rptd) No (04/20/2022  8:43 AM)  Do problems getting childcare make it difficult to work or study?: No (05/17/2022  2:00 PM)  In the last 12 months, have you needed to see a doctor, but could not because of cost?: No (05/17/2022  2:00 PM)  In the last 12 months, did you skip medications to save money?: No (05/17/2022  2:00 PM)  In the past 12 months, have you ever gone without  health care because you didn't have a way to get there?: No (05/17/2022  2:00 PM)  Do you have problems understanding what is told to you about your medical conditions?: (Patient-Rptd) No (04/20/2022  8:43 AM)  Do you often feel that you lack companionship?: (Patient-Rptd) No (04/20/2022  8:43 AM)  If you answered yes to any questions above, would you like to discuss help with your social work team?: (Patient-Rptd) No (04/20/2022  8:43 AM)      Personal prevention Plan reviewed with patient.    While the patient was here today, due to his/her multiple chronic conditions it would be in the best interest of the patient for me to monitor, assess and evaluate those as well. They are as follows.  Patient Active Problem List    Diagnosis Date Noted    Complex care coordination 09/14/2016     Priority: High    Myopic astigmatism of both eyes 11/01/2022    Hydronephrosis 04/28/2022    Hydronephrosis of right kidney 10/04/2021    Functional urinary incontinence 09/15/2021    Type 2 diabetes mellitus with hyperglycemia (HCC) 10/02/2020    Neurogenic bowel 06/12/2019    Nephrolithiasis 06/07/2019    GERD (gastroesophageal reflux disease) 05/02/2019    Right nephrolithiasis 05/02/2019    Small bowel obstruction (HCC) 11/28/2018    Chronic combined systolic (congestive) and diastolic (congestive) heart failure (HCC) 11/24/2018     Overview Note:     11/05/2021-ECHO-The left ventricle is normal in size, borderline normal function, estimate ejection fraction 50%. There are no regional wall motion abnormalities present though limited visualization of endocardium.  Normal diastolic function. The right ventricle is poorly seen, grossly appears normal in size, probably normal function. The left and right atria are poorly seen, probably normal in size (image 37) Valve structures are poorly seen, no significant valvular stenosis or regurgitation. Inadequate tricuspid regurgitation signal, unable to accurately estimate PA systolic pressure with this study. Comparison is made to prior study of 02/27/19, there are no significant changes, LVEF previously ~55%      UTI (urinary tract infection) 03/10/2018    Neurogenic bladder 12/16/2016    S/P ileal conduit (HCC) 10/29/2016    Non-ischemic cardiomyopathy (HCC) 09/29/2016  Essential hypertension 07/30/2016    Urethral Colon Rueth 07/31/2015     Overview Note:     Added automatically from request for surgery [id number]      Morbid obesity (HCC) 05/19/2015    Seizure disorder (HCC) 05/19/2015    Hydrocephalus (HCC) 04/28/2015    Kidney Qaadir Kent 02/14/2012     Overview Note:     [place] 12/27/11: The kidneys are normal in size. The right kidney measures 11.0 x 5.5 cm and contains a large calculus in the mid to inferior pelvis, measuring 4.8 cm x  0.6 cm x 1.5 cm. There is mild pelviectasis which fails to resolve on post void imaging. The left kidney measures 11.7 x 5.4 cm. No renal masses are identified.  02/28/12 CT showed right staghorn calculus and a small left upper pole Liboria Putnam.  04/07/12 right PCNL - stable post op course for 2 days and discharged. Readmitted with hypotension and respiratory distress and acute drop in H/H. No PE and pleural effusions. No signs of bleeding/no perinephric bleeding. Sepsis w/u neg but placed on erapenum. Second look dealyed until 04/21/12 - successful and dishcaged to [facility]  06/09/12 Recovered fully and now for metabolic w/u                S/P VP shunt 12/12/2009    Arnold-Chiari malformation (HCC) 11/27/2009    ADD (attention deficit disorder) 10/28/2006    Spina bifida (HCC) 10/28/2006    Unspecified constipation 10/28/2006       Other concerns addressed at this visit -  Diabetes and weight loss    Records requested at the time of this visit:No    Prior consultations, labs, radiology reports reviewed at the time of this visit.Yes: Most recent labs and pharmacy visit            Depression:  Patient Scores:  PHQ-2: PHQ-2 Score: (Patient-Rptd) 0 (11/25/2022  8:43 AM)  Depression screening 6 minutes  PHQ-9: No data recorded  Interventions:  PHQ-2: PHQ-2 Score less than 3: No follow-up or recommendations are necessary at this time (10/21/2022 10:43 AM)    Depression Interventions PHQ-2/9: No data recorded  BMI:  Body mass index is 47.02 kg/m?Marland Kitchen  No data recorded  Wt Readings from Last 10 Encounters:   12/02/22 122.7 kg (270 lb 6.4 oz)   10/28/22 117.9 kg (260 lb) 10/21/22 117.9 kg (260 lb)   09/01/22 124.8 kg (275 lb 1.6 oz)   06/01/22 118.6 kg (261 lb 6.4 oz)   05/25/22 120.2 kg (265 lb)   05/17/22 126.6 kg (279 lb 1.6 oz)   04/28/22 122.8 kg (270 lb 11.6 oz)   04/27/22 124.6 kg (274 lb 9.6 oz)   04/07/22 117.9 kg (260 lb)       Falls:  Fall History (last 30mo): No Falls (10/21/2022 10:43 AM)  Fall Risk: None identified (10/28/2022  9:21 AM)                     Review of Systems   Constitutional: Negative for fever and malaise/fatigue.   Cardiovascular:  Negative for chest pain and dyspnea on exertion.   Respiratory:  Negative for shortness of breath.    Gastrointestinal:  Negative for abdominal pain.   Neurological:  Negative for headaches.   Psychiatric/Behavioral:  Negative for depression.              Objective:          acetaminophen (TYLENOL EXTRA STRENGTH) 500 mg tablet Take two tablets by  mouth every 6 hours as needed for Pain. Max of 4,000 mg of acetaminophen in 24 hours.  Indications: pain    blood sugar diagnostic (ONETOUCH ULTRA TEST) test strip Use to check blood sugars daily. E11.65    blood-glucose meter (ACCU-CHEK GUIDE GLUCOSE METER) kit Use one strip as directed daily. Diagnosis Code: DM-2    carvediloL (COREG) 6.25 mg tablet Take one tablet by mouth twice daily. Take with food.  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    cyanocobalamin (vitamin B-12) 3,000 mcg cap Take one capsule by mouth daily.    ezetimibe (ZETIA) 10 mg tablet Take one tablet by mouth daily.    ferrous sulfate (SLOW RELEASE IRON) 142 mg (45 mg iron) ER tablet Take one tablet by mouth daily.    furosemide (LASIX) 20 mg tablet Take one tablet by mouth daily as needed.    icosapent ethyL (VASCEPA) 1 gram capsule Take two capsules by mouth twice daily with meals.    lancets 33 gauge 33 gauge Use to check blood sugars daily E11.65    Leg Brace misc Bilateral Fitted leg brace and right shoe for foot drop.  Dx: spina bifida.    levETIRAcetam (KEPPRA) 500 mg tablet Take one tablet by mouth twice daily.    loperamide (IMODIUM A-D) 2 mg capsule Take one capsule by mouth as Needed for Diarrhea.    losartan (COZAAR) 50 mg tablet Take one tablet by mouth daily.    metFORMIN-XR (GLUCOPHAGE XR) 500 mg extended release tablet Take one tablet by mouth daily with breakfast AND two tablets daily with dinner.    Miscellaneous Medical Supply misc CPAP- auto PAP with heated humdification, 4-20, with mask of patient's choice.  Indications: severe sleep apnea    Miscellaneous Medical Supply misc Shoe lift for right foot.  Dx: spina bifida    multivit-min/folic/vit K/lycop (ONE-A-DAY MEN'S MULTIVITAMIN PO) Take 1 tablet by mouth at bedtime daily.    Ostomy Supplies 1  misc [employer] 1 piece Urostomy bag.  Use 1 bag as needed.    psyllium seed (with sugar) (FIBER PO) Take 1 tablet by mouth twice daily.    rosuvastatin (CRESTOR) 40 mg tablet Take one tablet by mouth at bedtime daily.    sennosides-docusate sodium (SENOKOT-S) 8.6/50 mg tablet Take one tablet by mouth twice daily. Indications: constipation    spironolactone (ALDACTONE) 25 mg tablet Take two tablets by mouth daily. Take with food    tirzepatide (MOUNJARO) 12.5 mg/0.5 mL injector PEN Inject 0.5 mL under the skin every 7 days.    triamcinolone acetonide (TRIDERM) 0.1 % topical cream Apply  topically to affected area twice daily.     Vitals:    12/02/22 1315   BP: 110/74   BP Source: Arm, Right Upper   Pulse: 95   Temp: 36.6 ?C (97.9 ?F)   SpO2: 96%   TempSrc: Temporal   PainSc: Zero   Weight: 122.7 kg (270 lb 6.4 oz)   Height: 161.5 cm (5' 3.58)     Body mass index is 47.02 kg/m?Marland Kitchen   Vitals:    12/02/22 1315   BP: 110/74   BP Source: Arm, Right Upper   Patient Position: Sitting   Pulse: 95       Physical Exam  Constitutional:       Appearance: Normal appearance.   Neurological:      Mental Status:  He is alert and oriented to person, place, and time. Mental status is at baseline.   Psychiatric:         Mood and Affect: Mood normal.         Behavior: Behavior normal.         Health Maintenance   Topic Date Due    PNEUMOCOCCAL VACCINE 0-64 YRS (1 of 2 - PCV) Never done    HIV SCREENING  Never done    DIABETES MICROALBUMIN TO CREATININE RATIO  10/29/2022    DIABETES HBA1C  03/04/2023    DIABETES EGFR SCREEN  09/01/2023    DIABETES FOOT EXAM (.dmfoot)  12/02/2023    MEDICARE ANNUAL WELLNESS VISIT  12/02/2023    DIABETES DILATED EYE EXAM  10/27/2024    DTAP/TDAP VACCINES (10 - Td or Tdap) 03/01/2031    COVID-19 VACCINE  Completed    HEPATITIS C SCREENING  Completed    DEPRESSION SCREENING  Completed    INFLUENZA VACCINE  Completed    HPV VACCINES  Aged Out        The ASCVD Risk score (Arnett DK, et al., 2019) failed to calculate for the following reasons:    The 2019 ASCVD risk score is only valid for ages 72 to 40        Assessment and Plan:    Rod Can was seen today for annual wellness visit.    Diagnoses and all orders for this visit:    Encounter for Medicare annual wellness exam    Essential hypertension    Type 2 diabetes mellitus with hyperglycemia, without long-term current use of insulin (HCC)  -     POC HEMOGLOBIN A1C    Other orders  -     FLU VACCINE =>6 MO TRIVALENT  PF  -     COVID-19 VACCINE (>=12YO) (MODERNA) MRNA PF    Reviewed HRA.  Patient is up-to-date on age-appropriate screening.  Flu and COVID-vaccine today.  Recommend regular exercise.    Hypertension controlled.  Continue current meds.  Patient is going to bring his blood pressure monitor at next visit.    Diabetes has improved with an A1c of 7.8.  Patient to continue Mounjaro at 12 and half milligrams.  Also recommend that he exercises regularly.  I think this will be very helpful.  Continue current meds  Patient Instructions     Health Maintenance   Topic Date Due    PNEUMOCOCCAL VACCINE 0-64 YRS (1 of 2 - PCV) Never done    HIV SCREENING  Never done DIABETES FOOT EXAM (.dmfoot)  05/21/2022    INFLUENZA VACCINE (1) 08/12/2022    COVID-19 VACCINE (5 - 2024-25 season) 09/12/2022    DIABETES MICROALBUMIN TO CREATININE RATIO  10/29/2022    DIABETES HBA1C  12/02/2022    DIABETES EGFR SCREEN  09/01/2023    MEDICARE ANNUAL WELLNESS VISIT  12/02/2023    DIABETES DILATED EYE EXAM  10/27/2024    DTAP/TDAP VACCINES (10 - Td or Tdap) 03/01/2031    HEPATITIS C SCREENING  Completed    DEPRESSION SCREENING  Completed    HPV VACCINES  Aged Out      Visit Disposition       Dispositions    Return in about 6 months (around 06/01/2023) for DM f/u.            Future Appointments   Date Time Provider Department Center   12/28/2022  2:30 PM Reece Leader, [id number]   01/04/2023  10:45 AM Wyre, Ranelle Oyster, MD [id number] [place] Exam   01/27/2023  2:00 PM Buechler, Lia Hopping, DO [email or web address] Exam   06/02/2023  2:30 PM Hiram Gash, MD [facility] Community     Return in about 6 months (around 06/01/2023) for DM f/u.    I reviewed with the patient their current medications and specifically any new medications prescribed at the time of this visit and we reviewed the expected benefits and potential side effects. All questions are answered to the patient's satisfaction.    Health maintenance gaps were reviewed with the patient at the time of this visit.    I emphasized the importance of medication adherence.   The medical problems/diagnoses listed under assessment and plan were addressed at this visit and unless otherwise stated are adequately controlled.                Problem   Severe Sepsis (Hcc) (Resolved)   Type 2 Diabetes Mellitus Without Complication, Without Long-Term Current Use of Insulin (Hcc) (Resolved)   Visit for Wound Check (Resolved)   Aspiration into Respiratory Tract (Resolved)   Hypokalemia (Resolved)   Hypomagnesemia (Resolved)   Hyperglycemia (Resolved)   Leukocytosis (Resolved)   Fever (Resolved)   Pyelonephritis (Resolved)   Sepsis With Acute Organ Dysfunction (Hcc) (Resolved)   Transaminitis (Resolved)

## 2022-12-02 NOTE — Patient Instructions
Health Maintenance   Topic Date Due    PNEUMOCOCCAL VACCINE 0-64 YRS (1 of 2 - PCV) Never done    HIV SCREENING  Never done    DIABETES FOOT EXAM (.dmfoot)  05/21/2022    INFLUENZA VACCINE (1) 08/12/2022    COVID-19 VACCINE (5 - 2024-25 season) 09/12/2022    DIABETES MICROALBUMIN TO CREATININE RATIO  10/29/2022    DIABETES HBA1C  12/02/2022    DIABETES EGFR SCREEN  09/01/2023    MEDICARE ANNUAL WELLNESS VISIT  12/02/2023    DIABETES DILATED EYE EXAM  10/27/2024    DTAP/TDAP VACCINES (10 - Td or Tdap) 03/01/2031    HEPATITIS C SCREENING  Completed    DEPRESSION SCREENING  Completed    HPV VACCINES  Aged Out

## 2022-12-03 ENCOUNTER — Encounter: Admit: 2022-12-03 | Discharge: 2022-12-03 | Payer: MEDICARE

## 2022-12-03 MED FILL — MOUNJARO 12.5 MG/0.5 ML SC PNIJ: 12.50.5 mg/0.5 mL | SUBCUTANEOUS | 28 days supply | Qty: 2 | Fill #1 | Status: AC

## 2022-12-13 ENCOUNTER — Encounter: Admit: 2022-12-13 | Discharge: 2022-12-13 | Payer: MEDICARE

## 2022-12-14 ENCOUNTER — Encounter: Admit: 2022-12-14 | Discharge: 2022-12-14 | Payer: MEDICARE

## 2022-12-15 ENCOUNTER — Encounter: Admit: 2022-12-15 | Discharge: 2022-12-15 | Payer: MEDICARE

## 2022-12-15 MED FILL — ONETOUCH ULTRA TEST MISC STRP: 50 days supply | Qty: 50 | Fill #1 | Status: CP

## 2022-12-16 ENCOUNTER — Encounter: Admit: 2022-12-16 | Discharge: 2022-12-16 | Payer: MEDICARE

## 2022-12-17 ENCOUNTER — Encounter: Admit: 2022-12-17 | Discharge: 2022-12-17 | Payer: MEDICARE

## 2022-12-17 NOTE — Progress Notes
General Risk Score 7  HCC Score 4.471  Program criteria met -   Problem List (Diagnosis)    Patient was identified using dual identification of name and date of birth with repeat back    Patient Reports:     1. Spoke briefly as patient expecting furniture delivery at any time.   2. Doing well - medications okay, no questions or concerns.   3. Continues with plan of care and working on healthy diet and increasing activity.  4. Following with pharmacy for diabetic medications.      Treatment Goals and Expected Outcomes (patient centered)     1. Good blood sugar control   2. Increase activity as tolerated for strength, balance and mobility  3. Stay as healthy as possible      Barriers to Care:     1. Complex health conditions     Care Plan:     1. Continue to follow plan of care and specialty provider appointments  2. Continue work on [employer], good job on cutting down soda. Lean protein, vegetables and fruit - low carb  3. Continue to increase activity as tolerated for strength, balance and mobility  4. Instructed patient to call care manager any time with questions or concerns.   5. Routing note to PCP     LOV with PCP:    Upcoming appointments include:    Future Appointments   Date Time Provider Department Center   12/28/2022  2:30 PM Reece Leader, [id number]   01/04/2023 10:45 AM Wyre, Ranelle Oyster, MD [id number] [place] Exam   01/27/2023  2:00 PM Buechler, Lia Hopping, DO [id number] Exam   06/02/2023  2:30 PM Hiram Gash, MD [id number] Community       Next review [place] of plan (approximate):    4-6 weeks

## 2022-12-28 ENCOUNTER — Encounter: Admit: 2022-12-28 | Discharge: 2022-12-28 | Payer: MEDICARE

## 2022-12-28 ENCOUNTER — Ambulatory Visit: Admit: 2022-12-28 | Discharge: 2022-12-29 | Payer: MEDICARE

## 2022-12-28 NOTE — Progress Notes
A follow-up comprehensive medication management visit was completed today via telephone.    Referral reason: Diabetes Management  Referring provider: Orson Gear, MD    Assessment & Plan     Diabetes  Patient's diabetes is uncontrolled as reflected by home blood sugars and A1C. Their goal glucose is pre-prandial 80-130 and post-prandial <180. Their most recent A1C was 7.8% on 12/02/2022 with a goal of < 7%    Repeat A1c did improve but not at goal, reported blood sugars also not at goal. Patient was unable to tolerate mounjaro dose increase so will likely need to add a 3rd agent. Will have patient start monitoring post-dinner blood sugars in addition to fasting alternating days. Recommended working on increasing physical activity and making dietary changes. If no improvement, will add 3rd agent at next visit. Would benefit from SGLT-2 inhibitor for DM and CHF, however, he has had recurrent UTIs in the past so would be cautious with use.     Plan  - Continue Mounjaro 12.5 mg once a week - unable to tolerate 15 mg   - Continue metformin XR 500 mg AM and 1000 mg PM  - Start checking blood sugar fasting and 2 hours post dinner on alternating days    Follow-up  The patient will continue to follow up with the pharmacist. Return to pharmacist in 4 weeks via telephone. The return visit was scheduled during today's visit.    Subjective & Objective      Saw PCP last month, repeat A1C 7.8%.  Reported diarrhea has cleared up since decrease Mounjaro dose.     Weight trend:  12/17 - 258 lb without his braces (5-7 lb)    Hasn't been very physically active, hasn't been in the gym, would like to start going back with his brother    Eating 2-3 meals a day. Lunch is usually 12:45 - dinner is around 5-5:30     Diabetes    HPI  Current diabetes medication regimen:   - metformin XR 500 mg in the morning and 1000 mg in the evening   - Mounjaro 12.5 mg once a week on Sundays  Previous medications for diabetes:   - Mounjaro (10 mg) - switched to Ozempic due to shortage  - Trulicity - switched to [employer]   - metformin IR - diarrhea   - Januvia - switched to Trulicity     Blood Glucose  Monitoring times: fasting  The baseline HbA1C was 9.4 on 07/07/2020.    Patient reported readings:   Fasting  9-Dec 109  10-Dec 122  11-Dec 134  12-Dec 127  13-Dec 134  14-Dec 137  15-Dec 152  16-Dec 123  17-Dec 122  avg 128.9  Prev-avg 156    Diet and Exercise  Number of meals per day: 2  Breakfast: doesn't usually eat breakfast  Lunch: chicken salad  Dinner: mac and cheese, chicken salad, veggies, usually fairly carb heavy   Snacks: sometimes will snack throughout the day - popcorn, cookies, rice crispy, potato chips   Drinks: if not going out and wants something with his meal then will drink soda, but when he goes out will have water  Last meal of the day is usually 5 pm     Comorbidities  ASCVD condition(s): none  CKD: no  Heart failure: no    Obesity: yes    Health Maintenance  Aspirin utilization: Patient is not taking aspirin due to the following reason(s): not indicated.  Statin utilization: Patient is taking  a statin.  Hypertension: Patient has hypertension, which is not controlled.    Primary Care - Labs   Basic Metabolic Profile    Lab Results   Component Value Date/Time    NA 142 12/02/2022 01:11 PM    K 4.5 12/02/2022 01:11 PM    CA 10.0 12/02/2022 01:11 PM    CL 100 12/02/2022 01:11 PM    CO2 30 12/02/2022 01:11 PM    GAP 13 (H) 12/02/2022 01:11 PM    EGFR1 >60 09/01/2022 03:14 PM    Lab Results   Component Value Date/Time    BUN 13 12/02/2022 01:11 PM    CR 0.74 12/02/2022 01:11 PM    GLU 121 (H) 12/02/2022 01:11 PM         Lab Draw:  Lab Results   Component Value Date/Time    HGBA1C 8.8 (H) 09/01/2022 03:14 PM    HGBA1C 8.0 (H) 10/28/2021 02:42 PM    HGBA1C 8.6 (H) 04/29/2021 12:32 PM     POC:  Lab Results   Component Value Date/Time    A1C 7.8 (A) 12/02/2022 12:00 AM    A1C 8.3 (A) 04/27/2022 12:00 AM        No results found for: MCALB24 Microalbumin/CR ratio Urine   Date Value Ref Range Status   12/02/2022 92.5 (H) <30.0 ?g/mg Final     Urine Microalbumin   Date Value Ref Range Status   12/02/2022 84.2 ?g/mL Final        Lab Results   Component Value Date    CHOL 118 12/02/2022    TRIG 178 (H) 12/02/2022    HDL 36 (L) 12/02/2022    LDL 66 12/02/2022    VLDL 78.2 12/02/2022    NONHDLCHOL 82 12/02/2022    CHOLHDLC 6.9 (H) 10/29/2020         BP Readings from Last 1 Encounters:   12/02/22 110/74      Medication History  Medication history was not completed because previously completed (follow-up visit).    Adverse Drug Reactions  Adverse drug reactions were reviewed with the patient.    Significant adverse drug reaction(s) were not identified.    Side effect(s) were not reported.    The patient is filling their medications through 2 pharmacies due to the following reason(s): patient preference. Patient is currently using [facility] pharmacy. Patient is currently using [facility] based on patient preference.    Home Medications    Medication Sig   acetaminophen (TYLENOL EXTRA STRENGTH) 500 mg tablet Take two tablets by mouth every 6 hours as needed for Pain. Max of 4,000 mg of acetaminophen in 24 hours.  Indications: pain   blood sugar diagnostic (ONETOUCH ULTRA TEST) test strip Use to check blood sugars daily. E11.65   blood-glucose meter (ACCU-CHEK GUIDE GLUCOSE METER) kit Use one strip as directed daily. Diagnosis Code: DM-2   carvediloL (COREG) 6.25 mg tablet Take one tablet by mouth twice daily. Take with food.  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   cyanocobalamin (vitamin B-12) 3,000 mcg cap Take one capsule by mouth daily.   ezetimibe (ZETIA) 10 mg tablet Take one tablet by mouth daily.   ferrous sulfate (SLOW RELEASE IRON) 142 mg (45 mg iron) ER tablet Take one tablet by mouth daily.   furosemide (LASIX) 20 mg tablet Take one tablet by mouth daily as  needed.   icosapent ethyL (VASCEPA) 1 gram capsule Take two capsules by mouth twice daily with meals.   lancets 33 gauge 33 gauge Use to check blood sugars daily E11.65   Leg Brace misc Bilateral Fitted leg brace and right shoe for foot drop.  Dx: spina bifida.   levETIRAcetam (KEPPRA) 500 mg tablet Take one tablet by mouth twice daily.   loperamide (IMODIUM A-D) 2 mg capsule Take one capsule by mouth as Needed for Diarrhea.   losartan (COZAAR) 50 mg tablet Take one tablet by mouth daily.   metFORMIN-XR (GLUCOPHAGE XR) 500 mg extended release tablet Take one tablet by mouth daily with breakfast AND two tablets daily with dinner.   Miscellaneous Medical Supply misc CPAP- auto PAP with heated humdification, 4-20, with mask of patient's choice.  Indications: severe sleep apnea   Miscellaneous Medical Supply misc Shoe lift for right foot.  Dx: spina bifida   multivit-min/folic/vit K/lycop (ONE-A-DAY MEN'S MULTIVITAMIN PO) Take 1 tablet by mouth at bedtime daily.   Ostomy Supplies 1  misc [employer] 1 piece Urostomy bag.  Use 1 bag as needed.   psyllium seed (with sugar) (FIBER PO) Take 1 tablet by mouth twice daily.   rosuvastatin (CRESTOR) 40 mg tablet Take one tablet by mouth at bedtime daily.   sennosides-docusate sodium (SENOKOT-S) 8.6/50 mg tablet Take one tablet by mouth twice daily. Indications: constipation   spironolactone (ALDACTONE) 25 mg tablet Take two tablets by mouth daily. Take with food   tirzepatide (MOUNJARO) 12.5 mg/0.5 mL injector PEN Inject 0.5 mL under the skin every 7 days.   triamcinolone acetonide (TRIDERM) 0.1 % topical cream Apply  topically to affected area twice daily.      Education  Education provided: not necessary    Time spent with patient: 20 minutes    Reece Leader, [facility]

## 2022-12-31 ENCOUNTER — Encounter: Admit: 2022-12-31 | Discharge: 2022-12-31 | Payer: MEDICARE

## 2023-01-02 ENCOUNTER — Encounter: Admit: 2023-01-02 | Discharge: 2023-01-02 | Payer: MEDICARE

## 2023-01-03 ENCOUNTER — Encounter: Admit: 2023-01-03 | Discharge: 2023-01-03 | Payer: MEDICARE

## 2023-01-03 MED FILL — MOUNJARO 12.5 MG/0.5 ML SC PNIJ: 12.50.5 mg/0.5 mL | SUBCUTANEOUS | 28 days supply | Qty: 2 | Fill #2 | Status: AC

## 2023-01-04 ENCOUNTER — Encounter: Admit: 2023-01-04 | Discharge: 2023-01-04 | Payer: MEDICARE

## 2023-01-20 ENCOUNTER — Encounter: Admit: 2023-01-20 | Discharge: 2023-01-20 | Payer: MEDICARE

## 2023-01-20 NOTE — Progress Notes
Removed from CCM patient list

## 2023-01-24 ENCOUNTER — Encounter: Admit: 2023-01-24 | Discharge: 2023-01-24 | Payer: MEDICARE

## 2023-01-24 MED ORDER — EZETIMIBE 10 MG PO TAB
10 mg | ORAL_TABLET | Freq: Every day | ORAL | 3 refills | Status: AC
Start: 2023-01-24 — End: ?

## 2023-01-24 MED ORDER — ROSUVASTATIN 40 MG PO TAB
40 mg | ORAL_TABLET | Freq: Every evening | ORAL | 3 refills | 90.00000 days | Status: AC
Start: 2023-01-24 — End: ?

## 2023-01-25 ENCOUNTER — Ambulatory Visit: Admit: 2023-01-25 | Discharge: 2023-01-26 | Payer: MEDICARE

## 2023-01-25 NOTE — Progress Notes
A follow-up comprehensive medication management visit was completed today via telephone.    Referral reason: Diabetes Management  Referring provider: Orson Gear, MD    Assessment & Plan     Diabetes  Patient's diabetes is uncontrolled as reflected by home blood sugars and A1C. Their goal glucose is pre-prandial 80-130 and post-prandial <180. Their most recent A1C was 7.8% on 12/02/2022 with a goal of < 7%    Fasting blood sugars has worsened and not at goal due to missing a few weeks of Mounjaro and also dietary indiscretions with the holidays. Will have patient resume Mounjaro and get back to healthier eating habits and physical activity. Also will have patient start checking blood sugars after eating largest meal of the day.     If still elevated then will add 3rd agent. Would benefit from SGLT-2 inhibitor for DM and CHF, however, he has had recurrent UTIs in the past so would need to be cautious with use.     Plan  - Continue Mounjaro 12.5 mg once a week   - Continue metformin XR 500 mg AM and 1000 mg PM  - Start checking blood sugar fasting and 2 hours post dinner on alternating days    Follow-up  The patient will continue to follow up with the pharmacist. Return to pharmacist in 4 weeks via telephone. The return visit was scheduled during today's visit.    Subjective & Objective      Reported blood sugars have been high because he missed 2-3 doses of Mounjaro.   Took a dose of Mounjaro yesterday.   Hasn't been checking blood sugars in the evening.   Also hasn't been eating as healthy due to the holidays.      Weight trend:  12/17 - 258 lb without his braces (5-7 lb)    Hasn't been very physically active, hasn't been in the gym, would like to start going back with his brother    Eating 2-3 meals a day. Lunch is usually 12:45 - dinner is around 5-5:30     Diabetes    HPI  Current diabetes medication regimen:   - metformin XR 500 mg in the morning and 1000 mg in the evening   - Mounjaro 12.5 mg once a week on Monday  Previous medications for diabetes:   - Mounjaro 15 mg - unable to tolerate   - Trulicity - switched to [employer]   - metformin IR - diarrhea   - Januvia - switched to Trulicity     Blood Glucose  Monitoring times: fasting  The baseline HbA1C was 9.4 on 07/07/2020.    Patient reported readings:   fasting  14-Jan 129  13-Jan 192  12-Jan 237  11-Jan 181  10-Jan 171  9-Jan 155  8-Jan 186  7-Jan 149  6-Jan 124  avg 169  Pre-avg 128.9    Diet and Exercise  Number of meals per day: 2  Breakfast: doesn't usually eat breakfast  Lunch: chicken salad  Dinner: mac and cheese, chicken salad, veggies, usually fairly carb heavy   Snacks: sometimes will snack throughout the day - popcorn, cookies, rice crispy, potato chips   Drinks: if not going out and wants something with his meal then will drink soda, but when he goes out will have water  Last meal of the day is usually 5 pm     Comorbidities  ASCVD condition(s): none  CKD: no  Heart failure: no    Obesity: yes    Health Maintenance  Aspirin utilization: Patient is not taking aspirin due to the following reason(s): not indicated.  Statin utilization: Patient is taking a statin.  Hypertension: Patient has hypertension, which is not controlled.    Primary Care - Labs   Basic Metabolic Profile    Lab Results   Component Value Date/Time    NA 140 01/04/2023 11:45 AM    K 3.9 01/04/2023 11:45 AM    CA 9.5 01/04/2023 11:45 AM    CL 103 01/04/2023 11:45 AM    CO2 30 01/04/2023 11:45 AM    GAP 7 01/04/2023 11:45 AM    EGFR1 >60 09/01/2022 03:14 PM    Lab Results   Component Value Date/Time    BUN 17 01/04/2023 11:45 AM    CR 0.68 01/04/2023 11:45 AM    GLU 220 (H) 01/04/2023 11:45 AM         Lab Draw:  Lab Results   Component Value Date/Time    HGBA1C 8.8 (H) 09/01/2022 03:14 PM    HGBA1C 8.0 (H) 10/28/2021 02:42 PM    HGBA1C 8.6 (H) 04/29/2021 12:32 PM     POC:  Lab Results   Component Value Date/Time    A1C 7.8 (A) 12/02/2022 12:00 AM    A1C 8.3 (A) 04/27/2022 12:00 AM        No results found for: MCALB24   Microalbumin/CR ratio Urine   Date Value Ref Range Status   12/02/2022 92.5 (H) <30.0 ?g/mg Final     Urine Microalbumin   Date Value Ref Range Status   12/02/2022 84.2 ?g/mL Final        Lab Results   Component Value Date    CHOL 118 12/02/2022    TRIG 178 (H) 12/02/2022    HDL 36 (L) 12/02/2022    LDL 66 12/02/2022    VLDL 66.0 12/02/2022    NONHDLCHOL 82 12/02/2022    CHOLHDLC 6.9 (H) 10/29/2020         BP Readings from Last 1 Encounters:   01/04/23 121/75      Medication History  Medication history was not completed because previously completed (follow-up visit).    Adverse Drug Reactions  Adverse drug reactions were reviewed with the patient.    Significant adverse drug reaction(s) were not identified.    Side effect(s) were not reported.    The patient is filling their medications through 2 pharmacies due to the following reason(s): patient preference. Patient is currently using [facility] pharmacy. Patient is currently using [facility] based on patient preference.    Home Medications    Medication Sig   acetaminophen (TYLENOL EXTRA STRENGTH) 500 mg tablet Take two tablets by mouth every 6 hours as needed for Pain. Max of 4,000 mg of acetaminophen in 24 hours.  Indications: pain   blood sugar diagnostic (ONETOUCH ULTRA TEST) test strip Use to check blood sugars daily. E11.65   blood-glucose meter (ACCU-CHEK GUIDE GLUCOSE METER) kit Use one strip as directed daily. Diagnosis Code: DM-2   carvediloL (COREG) 6.25 mg tablet Take one tablet by mouth twice daily. Take with food.  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   cyanocobalamin (vitamin B-12) 3,000 mcg cap Take one capsule by mouth daily.   ezetimibe (ZETIA) 10 mg tablet TAKE 1 TABLET BY MOUTH once daily   ferrous sulfate (SLOW RELEASE IRON) 142 mg (45 mg iron)  ER tablet Take one tablet by mouth daily.   furosemide (LASIX) 20 mg tablet Take one tablet by mouth daily as needed.   icosapent ethyL (VASCEPA) 1 gram capsule Take two capsules by mouth twice daily with meals.   lancets 33 gauge 33 gauge Use to check blood sugars daily E11.65   Leg Brace misc Bilateral Fitted leg brace and right shoe for foot drop.  Dx: spina bifida.   levETIRAcetam (KEPPRA) 500 mg tablet Take one tablet by mouth twice daily.   loperamide (IMODIUM A-D) 2 mg capsule Take one capsule by mouth as Needed for Diarrhea.   losartan (COZAAR) 50 mg tablet Take one tablet by mouth daily.   metFORMIN-XR (GLUCOPHAGE XR) 500 mg extended release tablet Take one tablet by mouth daily with breakfast AND two tablets daily with dinner.   M[employer] misc CPAP- auto PAP with heated humdification, 4-20, with mask of patient's choice.  Indications: severe sleep apnea   Miscellaneous [employer] misc Shoe lift for right foot.  Dx: spina bifida   multivit-min/folic/vit K/lycop (ONE-A-DAY MEN'S MULTIVITAMIN PO) Take 1 tablet by mouth at bedtime daily.   Ostomy Supplies 1  misc [employer] 1 piece Urostomy bag.  Use 1 bag as needed.   psyllium seed (with sugar) (FIBER PO) Take 1 tablet by mouth twice daily.   rosuvastatin (CRESTOR) 40 mg tablet TAKE 1 TABLET BY MOUTH at bedtime   sennosides-docusate sodium (SENOKOT-S) 8.6/50 mg tablet Take one tablet by mouth twice daily. Indications: constipation   spironolactone (ALDACTONE) 25 mg tablet Take two tablets by mouth daily. Take with food   tirzepatide (MOUNJARO) 12.5 mg/0.5 mL injector PEN Inject 0.5 mL under the skin every 7 days.   triamcinolone acetonide (TRIDERM) 0.1 % topical cream Apply  topically to affected area twice daily.      Education  Education provided: not necessary    Time spent with patient: 10 minutes    Reece Leader, [facility]

## 2023-01-27 ENCOUNTER — Ambulatory Visit: Admit: 2023-01-27 | Discharge: 2023-01-28 | Payer: MEDICARE

## 2023-01-27 ENCOUNTER — Encounter: Admit: 2023-01-27 | Discharge: 2023-01-27 | Payer: MEDICARE

## 2023-01-27 DIAGNOSIS — I5042 Chronic combined systolic (congestive) and diastolic (congestive) heart failure: Secondary | ICD-10-CM

## 2023-01-27 DIAGNOSIS — I428 Other cardiomyopathies: Secondary | ICD-10-CM

## 2023-01-27 NOTE — Patient Instructions
Follow-Up:    -Thank you for allowing [place] to participate in your care today. Your After Visit Summary is being completed by Barbaraann Cao, RN.    -We would like you to follow up in  1 years with Modena Jansky, DO  -The schedule is released approximately 4-5 months in advance. You will be called by our scheduling department to make an appointment and you will also receive a notification via MyChart to self-schedule.  However, if you would like to call to make this appt, please call [phone].        Changes From Today's Office Visit   No changes    Contacting our office:    -For NON-URGENT questions please contact us via message through your MyChart account.   -For all medication refills please contact your pharmacy or send a request through MyChart.     -For all questions that may need to be addressed urgently please call the [facility] nursing triage line at [phone] Monday - Friday 8am-5pm only. Please leave a detailed message with your name, date of birth, and reason for your call.  If your message is received before 3:30pm, every effort will be made to call you back the same day.  Please allow time for [place] to review your chart prior to call back.     -Should you have an urgent concern over the weekend/nights, the on-call triage line is [phone].    [facility] team fax number: [phone]    -You may receive a survey in the upcoming weeks from [facility]. Your feedback is important to [place] and helps [place] continue to improve patient care and patient satisfaction.     Results & Testing Follow Up:    -Please allow 5-7 business days for the results of any testing to be reviewed. Please call our office if you have not heard from a nurse within this time frame.    -Should you choose to complete testing at an outside facility, please contact our office after completion of testing so that we can ensure that we have received results for your provider to review.    Lab and test results:  As a part of the CARES act, starting 04/12/2019, some results will be released to you via MyChart immediately and automatically.  You may see results before your provider sees them; however, your provider will review all these results and then they, or one of their team, will notify you of result information and recommendations.   Critical results will be addressed immediately, but otherwise, please allow [place] time to get back with you prior to you reaching out to [place] for questions.  This will usually take about 72 hours for labs and 5-7 days for procedure test results.

## 2023-01-27 NOTE — Telephone Encounter
Pt dropped off paperwork. Completed and placed for pick-up.    Called pt to notify him and he verbalized understanding.

## 2023-01-27 NOTE — Progress Notes
Cardiology Consultation Note     Date of Service: 01/27/2023    History of Present Illness          History of present illness: Allen Gill is a very pleasant 33 y.o. male who is being seen at the [facility] [place] Cardiovascular Medicine Department at the [facility] office.      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     He presents today for follow-up.  Overall, he states that he is doing well and has no specific cardiopulmonary complaints today.  He was hospitalized back in May with urosepsis and septic shock requiring IV pressor support and high-dose steroids.  His urine culture demonstrated mixed contaminants and completed a course of antibiotics.  His GDMT was initially held due to septic shock.  He has subsequently restarted his GDMT.  He is notes that his blood pressures have been adequately controlled ranging 120-130/70-80.  He is weighing himself regularly and notes that his weights are around 255 pounds and that they have steadily been decreasing.  He notes some swelling in his right foot but no other evidence of pedal edema.  He is not taking any loop diuretic.  He is not noticing any exertional dyspnea or orthopnea.  He denies any angina, tachypalpitations, lightheadedness or dizziness, presyncope or syncope, stroke or TIA-like symptoms.  He follows with pharmacy for medication adjustments for his diabetes.  They are considering starting an SGLT2 inhibitor.       Cardiac History: Patient was admitted to the ICU with urosepsis in 2017, echocardiogram revealed an EF of 25%.  He had subsequent pharmacologic MPI study that did not demonstrate any inducible ischemia.  He was started on GDMT and had subsequent recovery of his EF on echocardiogram from 2019.  He has not had any hospitalizations for decompensated heart failure.     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 There are no regional wall motion abnormalities present though limited visualization of endocardium.  Normal diastolic function.  The right ventricle is poorly seen, grossly appears normal in size, probably normal function.  The left and right atria are poorly seen, probably normal in size (image 37)  Valve structures are poorly seen, no significant valvular stenosis or regurgitation.  Inadequate tricuspid regurgitation signal, unable to accurately estimate PA systolic pressure with this study.  Comparison is made to prior study of 02/27/19, there are no significant changes, LVEF previously ~55%     Stress Test 08/03/2016:  This study is abnormal.  Left ventricular systolic function is markedly reduced.  There is diffuse left ventricular hypokinesis with near akinesis of the inferolateral segment.    There does not appear to be evidence of significant active inducible ischemia.    Other than the reduced ejection fraction no other negative prognostic indicators are present.    Viability appears to be preserved throughout the myocardium.     Most recent results for 12-Lead ECG   ECG 12-LEAD    Collection Time: 05/16/22  3:39 AM   Result Value Status    VENTRICULAR RATE 139 Final    P-R INTERVAL 132 Final    QRS DURATION 82 Final  Q-T INTERVAL 282 Final    QTC CALCULATION (BAZETT) 429 Final    P AXIS 32 Final    R AXIS -29 Final    T AXIS 7 Final    Impression    Sinus tachycardia  Inferior infarct (cited on or before 16-May-2022)  Anterior infarct (cited on or before 16-May-2022)  Abnormal ECG  When compared with ECG of 15-May-2022 23:32,  No significant change was found  Confirmed by Ceule, Scott ([id number]) on 05/23/2022 9:49:16 AM   [facility] MAIN ECG TRIAGE ONLY    Collection Time: 05/15/22 11:32 PM   Result Value Status    VENTRICULAR RATE 131 Final    P-R INTERVAL 136 Final    QRS DURATION 80 Final    Q-T INTERVAL 276 Final    QTC CALCULATION (BAZETT) 407 Final    P AXIS 35 Final    R AXIS -35 Final    T AXIS 7 Final    Impression    Sinus tachycardia  When compared with ECG of 04-Nov-2020 13:57,  Vent. rate has increased BY  44 BPM  Confirmed by Bennett Scrape ([id number]) on 05/16/2022 12:08:18 AM          Review of Systems   Constitutional: Negative.   HENT: Negative.     Eyes: Negative.    Cardiovascular: Negative.    Respiratory: Negative.     Endocrine: Negative.    Hematologic/Lymphatic: Negative.    Skin: Negative.    Musculoskeletal: Negative.    Gastrointestinal: Negative.    Genitourinary: Negative.    Neurological: Negative.    Psychiatric/Behavioral: Negative.     Allergic/Immunologic: Negative.        Current Medications (including today's revisions)   acetaminophen (TYLENOL EXTRA STRENGTH) 500 mg tablet Take two tablets by mouth every 6 hours as needed for Pain. Max of 4,000 mg of acetaminophen in 24 hours.  Indications: pain    blood sugar diagnostic (ONETOUCH ULTRA TEST) test strip Use to check blood sugars daily. E11.65    blood-glucose meter (ACCU-CHEK GUIDE GLUCOSE METER) kit Use one strip as directed daily. Diagnosis Code: DM-2    carvediloL (COREG) 6.25 mg tablet Take one tablet by mouth twice daily. Take with food.  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    cyanocobalamin (vitamin B-12) 3,000 mcg cap Take one capsule by mouth daily.    ezetimibe (ZETIA) 10 mg tablet TAKE 1 TABLET BY MOUTH once daily    ferrous sulfate (SLOW RELEASE IRON) 142 mg (45 mg iron) ER tablet Take one tablet by mouth daily.    furosemide (LASIX) 20 mg tablet Take one tablet by mouth daily as needed.    icosapent ethyL (VASCEPA) 1 gram capsule Take two capsules by mouth twice daily with meals.    lancets 33 gauge 33 gauge Use to check blood sugars daily E11.65    Leg Brace misc Bilateral Fitted leg brace and right shoe for foot drop.  Dx: spina bifida.    levETIRAcetam (KEPPRA) 500 mg tablet Take one tablet by mouth twice daily.    loperamide (IMODIUM A-D) 2 mg capsule Take one capsule by mouth as Needed for Diarrhea.    losartan (COZAAR) 50 mg tablet Take one tablet by mouth daily.    metFORMIN-XR (GLUCOPHAGE XR) 500 mg extended release tablet Take one tablet by mouth daily with breakfast AND two tablets daily with  dinner.    Miscellaneous Medical Supply misc CPAP- auto PAP with heated humdification, 4-20, with mask of patient's choice.  Indications: severe sleep apnea    Miscellaneous Medical Supply misc Shoe lift for right foot.  Dx: spina bifida    multivit-min/folic/vit K/lycop (ONE-A-DAY MEN'S MULTIVITAMIN PO) Take 1 tablet by mouth at bedtime daily.    Ostomy Supplies 1  misc Hollister 1 piece Urostomy bag.  Use 1 bag as needed.    psyllium seed (with sugar) (FIBER PO) Take 1 tablet by mouth twice daily.    rosuvastatin (CRESTOR) 40 mg tablet TAKE 1 TABLET BY MOUTH at bedtime    sennosides-docusate sodium (SENOKOT-S) 8.6/50 mg tablet Take one tablet by mouth twice daily. Indications: constipation (Patient not taking: Reported on 01/27/2023)    spironolactone (ALDACTONE) 25 mg tablet Take two tablets by mouth daily. Take with food    tirzepatide (MOUNJARO) 12.5 mg/0.5 mL injector PEN Inject 0.5 mL under the skin every 7 days.    triamcinolone acetonide (TRIDERM) 0.1 % topical cream Apply  topically to affected area twice daily. (Patient not taking: Reported on 01/27/2023)       Allergies  Allergies   Allergen Reactions    Latex RASH and SHORTNESS OF BREATH    Amoxicillin RASH and STOMACH UPSET     08/03/17 discussed this w/ patient. Amox/clav in 2014. He certainly took it.  Notes from that time indicate diarrhea. Discussed this with him on 03/13/18 and he stated that he'd taken amox 3 times in past and each time had HA, debilitating fatigue and diarrhea. And maybe rash. At this time he was tolerating IV ampicillin. Manya Silvas, MD 03/13/18  Update Luchi MD, 11/28/18: He got IV ampicillin at [facility] 2/29 to 03/15/18 w/o rash or SE. He was started on Augmentin 05/08/19 without significant SE    Ceclor [Cefaclor] HIVES     Pt has tolerated ancef, keflex, ceftriaxone and cefepime (many prescriptions for these in med review in [place].    Clindamycin HIVES    Zosyn [Piperacillin-Tazobactam] HIVES        Objective     Vital Signs This Visit  Vitals:    01/27/23 1316   BP: 103/72   BP Source: Arm, Left Upper   Pulse: 100   Temp: 36.1 ?C (97 ?F)   SpO2: 96%   O2 Device: None (Room air)   TempSrc: Skin   PainSc: Zero   Weight: 120.7 kg (266 lb)   Height: 162.6 cm (5' 4)       Physical Exam  General Appearance: No acute distress. Fully alert and oriented. Obese. Hoarse voice  Skin: Warm. No ulcers or xanthomas.   HEENT: Grossly unremarkable. Lips and oral mucosa without pallor or cyanosis. Moist mucous membranes.   Neck Veins: Normal jugular venous pressure. Neck veins are not distended.  Carotid Arteries: Normal carotid upstroke bilaterally. No bruits.  Auscultation/Percussion: Normal respiratory effort. Lungs clear to auscultation bilaterally. No wheezes, rales, or rhonchi.    Cardiac Rhythm: Regular rhythm. Normal rate.  Cardiac Auscultation: Normal S1 & S2. No S3 or S4. No rub.  Murmurs: No cardiac murmurs.  Peripheral Circulation: Normal peripheral circulation.   Extremities: Appropriately warm to touch. No lower extremity edema. Bilateral leg braces in place      Cardiovascular Health Factors  Vitals BP Readings from Last 3 Encounters:   01/27/23 103/72   01/04/23 121/75   12/02/22 110/74     Wt Readings from Last 3 Encounters:   01/27/23 120.7  kg (266 lb)   01/04/23 122 kg (269 lb)   12/02/22 122.7 kg (270 lb 6.4 oz)     BMI Readings from Last 3 Encounters:   01/27/23 45.66 kg/m?   01/04/23 46.78 kg/m?   12/02/22 47.02 kg/m?      Smoking Social History     Tobacco Use   Smoking Status Never   Smokeless Tobacco Never      Lipid Profile Cholesterol   Date Value Ref Range Status   12/02/2022 118 <200 mg/dL Final     HDL   Date Value Ref Range Status   12/02/2022 36 (L) >40 mg/dL Final     LDL   Date Value Ref Range Status   12/02/2022 66 <100 mg/dL Final     Triglycerides   Date Value Ref Range Status   12/02/2022 178 (H) <150 mg/dL Final      Blood Sugar Hemoglobin A1C   Date Value Ref Range Status   09/01/2022 8.8 (H) 4.0 - 5.7 % Final     Comment:     The ADA recommends that most patients with type 1 and type 2 diabetes maintain   an A1c level <7%.       Glucose   Date Value Ref Range Status   01/04/2023 220 (H) 70 - 100 mg/dL Final   16/10/9602 540 (H) 70 - 100 mg/dL Final   98/11/9145 829 (H) 70 - 100 MG/DL Final     Glucose, POC   Date Value Ref Range Status   05/23/2022 143 (H) 70 - 100 MG/DL Final   56/21/3086 578 (H) 70 - 100 MG/DL Final   46/96/2952 841 (H) 70 - 100 MG/DL Final      10 Year ASCVD Risk The ASCVD Risk score (Arnett DK, et al., 2019) failed to calculate for the following reasons:    The 2019 ASCVD risk score is only valid for ages 67 to 62          Assessment and Plan        Allen Gill is a 33 y.o. who was seen for the following problems:    Nonischemic cardiomyopathy in the setting of urosepsis and Adderall use  Heart failure with recovered ejection fraction, most recent EF 50-55%  Diabetes melitis, type II, not on insulin therapy, A1c 7.8, poorly controlled  Hyperlipidemia, LDL 66  Hypertension, well-controlled  Obesity, BMI 51 kg/m?, on Mounjaro  Hydrocephalus and Chiari formation status post VP shunt  Neurogenic bladder status post cystectomy with ileal conduit    Plan:  Patient is seen today for follow-up of his nonischemic cardiomyopathy.  From a cardiac standpoint he is stable without objective evidence of heart failure.  He has ACC/AHA stage C with NYHA class II symptoms.  He is euvolemic.  Most recent echocardiogram demonstrates an EF of 50-55%.  He does not routinely take Lasix.  Will continue his current GDMT which includes carvedilol 6.25 mg twice daily, losartan 50 mg daily, spironolactone 25 mg daily.  He currently is not on SGLT2 inhibitor but he is PCP and pharmacy are considering adding it for management of his diabetes.  From a heart failure standpoint, not sure how much she would benefit from this as his cardiomyopathy occurred in the setting of severe sepsis and he has not had any concerns for decompensated heart failure since then.  Additionally, he has frequent urinary tract infections as well as urosepsis and would be concern for worsening of his urinary tract infections.  Will defer to PCP and pharmacy for initiation of SGLT2 inhibitor for diabetes management.     His cardiac risk factors are under fair control at this time.  He has blood pressures in cholesterol levels are adequately controlled.  His most recent cholesterol panel demonstrated total cholesterol 118, HDL 36, LDL 66, triglycerides 178.  His most recent A1c was 7.8 and suggest suboptimal control of his diabetes.     We discussed ongoing lifestyle modifications     Thank you for allowing [place] to care for this patient. If there are any questions or concerns, please don't hesitate to contact us.     We will see the patient back in 12 months time, or sooner if needed.     Modena Jansky, DO  Staff Cardiologist  Department of Cardiovascular Medicine  [facility] via [place] or Pager #: [phone]    Total time spent on today's office visit was .  This includes over 50% face-to-face in person visit with patient as well as nonface-to-face time including review of the EMR, outside records, labs, radiologic studies, echocardiogram & other cardiovascular studies, formation of treatment plan, after visit summary, future disposition, and lastly on documentation.    This note was in part completed with Dragon, a Chemical engineer. Some grammatical errors may have occurred. If you have concerns,please contact my office for clarification.

## 2023-02-01 ENCOUNTER — Encounter: Admit: 2023-02-01 | Discharge: 2023-02-01 | Payer: MEDICARE

## 2023-02-02 MED FILL — ONETOUCH ULTRA TEST MISC STRP: 50 days supply | Qty: 50 | Fill #2 | Status: CP

## 2023-02-17 ENCOUNTER — Encounter: Admit: 2023-02-17 | Discharge: 2023-02-17 | Payer: MEDICARE

## 2023-02-17 MED ORDER — CARVEDILOL 6.25 MG PO TAB
12.5 mg | ORAL_TABLET | Freq: Two times a day (BID) | ORAL | 3 refills | 90.00000 days | Status: AC
Start: 2023-02-17 — End: ?

## 2023-02-17 NOTE — Telephone Encounter
Medication Name: carvedilol  Last office  visit: 12/02/22  Next office visit: 06/02/23  Last labs drawn: 12/02/22    Routing to Dr. Larina Bras for approval/denial.

## 2023-02-18 ENCOUNTER — Encounter: Admit: 2023-02-18 | Discharge: 2023-02-18 | Payer: MEDICARE

## 2023-02-22 ENCOUNTER — Ambulatory Visit: Admit: 2023-02-22 | Discharge: 2023-02-23 | Payer: MEDICARE

## 2023-02-22 ENCOUNTER — Encounter: Admit: 2023-02-22 | Discharge: 2023-02-22 | Payer: MEDICARE

## 2023-02-24 ENCOUNTER — Encounter: Admit: 2023-02-24 | Discharge: 2023-02-24 | Payer: MEDICARE

## 2023-02-25 ENCOUNTER — Encounter: Admit: 2023-02-25 | Discharge: 2023-02-25 | Payer: MEDICARE

## 2023-02-25 MED FILL — MOUNJARO 12.5 MG/0.5 ML SC PNIJ: 12.50.5 mg/0.5 mL | SUBCUTANEOUS | 28 days supply | Qty: 2 | Fill #3 | Status: AC

## 2023-03-09 ENCOUNTER — Encounter: Admit: 2023-03-09 | Discharge: 2023-03-09 | Payer: MEDICARE

## 2023-03-12 ENCOUNTER — Encounter: Admit: 2023-03-12 | Discharge: 2023-03-12 | Payer: MEDICARE

## 2023-03-12 MED FILL — LANCETS 33 GAUGE MISC MISC: 33 gauge | 90 days supply | Qty: 100 | Fill #2 | Status: CP

## 2023-03-21 ENCOUNTER — Encounter: Admit: 2023-03-21 | Discharge: 2023-03-21 | Payer: MEDICARE

## 2023-03-22 ENCOUNTER — Ambulatory Visit: Admit: 2023-03-22 | Discharge: 2023-03-23 | Payer: MEDICARE

## 2023-03-22 ENCOUNTER — Encounter: Admit: 2023-03-22 | Discharge: 2023-03-22 | Payer: MEDICARE

## 2023-03-22 DIAGNOSIS — I428 Other cardiomyopathies: Secondary | ICD-10-CM

## 2023-03-22 DIAGNOSIS — I10 Essential (primary) hypertension: Secondary | ICD-10-CM

## 2023-03-22 DIAGNOSIS — E1165 Type 2 diabetes mellitus with hyperglycemia: Secondary | ICD-10-CM

## 2023-03-22 MED ORDER — LANTUS SOLOSTAR U-100 INSULIN 100 UNIT/ML (3 ML) SC INPN
10-50 [IU] | Freq: Every evening | SUBCUTANEOUS | 3 refills | 68.00000 days | Status: AC
Start: 2023-03-22 — End: ?

## 2023-03-22 MED ORDER — FREESTYLE LIBRE 3 PLUS SENSOR MISC DEVI
11 refills | 90.00000 days | Status: AC
Start: 2023-03-22 — End: ?

## 2023-03-22 MED ORDER — SPIRONOLACTONE 25 MG PO TAB
50 mg | ORAL_TABLET | Freq: Every day | ORAL | 1 refills | 90.00000 days | Status: AC
Start: 2023-03-22 — End: ?

## 2023-03-22 MED ORDER — PEN NEEDLE, DIABETIC 31 GAUGE X 5/16" MISC NDLE
3 refills | 90.00000 days | Status: AC
Start: 2023-03-22 — End: ?
  Filled 2023-03-24: qty 50, 50d supply, fill #3

## 2023-03-22 NOTE — Progress Notes
 A follow-up comprehensive medication management visit was completed today via telephone.    Referral reason: Diabetes Management  Referring provider: Orson Gear, MD    Assessment & Plan     Diabetes  Patient's diabetes is uncontrolled as reflected by home blood sugars and A1C. Their goal glucose is pre-prandial 80-130 and post-prandial <180. Their most recent A1C was 7.8% on 12/02/2022 with a goal of < 7%    Average fasting blood sugar continues to not be at goal despite making dietary changes and being more adherent to medications. On maximally tolerated dose of current medications. Would benefit from adding a 3rd agent at this time for additional blood sugar lowering.    Had discussed SGLT-2 inhibitor previously, however, due to concerns with urosepsis and recurrent UTIs, will avoid this agent at this time.     Basal insulin would be a good option for him to lower blood sugars. He would likely qualify for a CGM once he is on insulin, which would allow for closer BG monitoring. Patient is agreeable to making this change today.    Plan  - START Lantus 10 units once daily - will bring in to clinic for teaching once he receives from pharmacy  - START using CGM - will bring into clinic for teaching  - Continue Mounjaro 12.5 mg once a week   - Continue metformin XR 500 mg AM and 1000 mg PM    Follow-up  The patient will continue to follow up with the pharmacist. Return to pharmacist in 2 weeks in person. The return visit will be scheduled at a later date.    Subjective & Objective      Has been trying to eat healthier but not sure why his blood sugars haven't been better  Hasn't been able to get his fasting blood sugars <130     Did discuss SGLT-2 inhibitor with cardiologist. Molli Knock from cardiac standpoint, concerned about his kidneys and history of recurrent infections/urosepsis.    Weight trend:  12/17 - 258 lb without his braces (5-7 lb)    Hasn't been very physically active, hasn't been in the gym, would like to start going back with his brother    Eating 2-3 meals a day. Lunch is usually 12:45 - dinner is around 5-5:30     Diabetes    HPI  Current diabetes medication regimen:   - metformin XR 500 mg in the morning and 1000 mg in the evening   - Mounjaro 12.5 mg once a week on Tuesdays  Previous medications for diabetes:   - Mounjaro 15 mg - unable to tolerate   - Trulicity - switched to [employer]   - metformin IR - diarrhea   - Januvia - switched to Trulicity     Blood Glucose  Monitoring times: fasting  The baseline HbA1C was 9.4 on 07/07/2020.    Patient reported readings:   fasting  21-Feb 126  22-Feb 186  23-Feb 154  24-Feb 151  25-Feb 151  26-Feb 163  27-Feb 186  28-Feb 170  1-Mar 146  2-Mar 167  3-Mar 148  4-Mar 156  5-Mar 171  6-Mar 170  avg 160  Prev-avg 172  Pre-avg 169    Diet and Exercise  Number of meals per day: 2  Breakfast: doesn't usually eat breakfast  Lunch: chicken salad  Dinner: mac and cheese, chicken salad, veggies, usually fairly carb heavy   Snacks: sometimes will snack throughout the day - popcorn, cookies, rice crispy, potato chips  Drinks: if not going out and wants something with his meal then will drink soda, but when he goes out will have water  Last meal of the day is usually 5 pm     Comorbidities  ASCVD condition(s): none  CKD: no  Heart failure: no    Obesity: yes    Health Maintenance  Aspirin utilization: Patient is not taking aspirin due to the following reason: not indicated.  Statin utilization: Patient is taking a statin.  Hypertension: Patient has hypertension, which is not controlled.    Primary Care - Labs   Basic Metabolic Profile    Lab Results   Component Value Date/Time    NA 140 01/04/2023 11:45 AM    K 3.9 01/04/2023 11:45 AM    CA 9.5 01/04/2023 11:45 AM    CL 103 01/04/2023 11:45 AM    CO2 30 01/04/2023 11:45 AM    GAP 7 01/04/2023 11:45 AM    EGFR1 >60 09/01/2022 03:14 PM    Lab Results   Component Value Date/Time    BUN 17 01/04/2023 11:45 AM    CR 0.68 01/04/2023 11:45 AM    GLU 220 (H) 01/04/2023 11:45 AM         Lab Draw:  Lab Results   Component Value Date/Time    HGBA1C 8.8 (H) 09/01/2022 03:14 PM    HGBA1C 8.0 (H) 10/28/2021 02:42 PM    HGBA1C 8.6 (H) 04/29/2021 12:32 PM     POC:  Lab Results   Component Value Date/Time    A1C 7.8 (A) 12/02/2022 12:00 AM    A1C 8.3 (A) 04/27/2022 12:00 AM        No results found for: MCALB24   Microalbumin/CR ratio Urine   Date Value Ref Range Status   12/02/2022 92.5 (H) <30.0 ?g/mg Final     Urine Microalbumin   Date Value Ref Range Status   12/02/2022 84.2 ?g/mL Final        Lab Results   Component Value Date    CHOL 118 12/02/2022    TRIG 178 (H) 12/02/2022    HDL 36 (L) 12/02/2022    LDL 66 12/02/2022    VLDL 18.8 12/02/2022    NONHDLCHOL 82 12/02/2022    CHOLHDLC 6.9 (H) 10/29/2020         BP Readings from Last 1 Encounters:   01/27/23 103/72      Medication History  Medication history was not completed because previously completed (follow-up visit).    Adverse Drug Reactions  Adverse drug reactions were reviewed with the patient.    Significant adverse drug reaction(s) were not identified.    Side effect(s) were not reported.    The patient is filling their medications through 2 pharmacies due to the following reason(s): patient preference. Patient is currently using [facility] pharmacy. Patient is currently using [facility] based on patient preference.    Home Medications    Medication Sig   acetaminophen (TYLENOL EXTRA STRENGTH) 500 mg tablet Take two tablets by mouth every 6 hours as needed for Pain. Max of 4,000 mg of acetaminophen in 24 hours.  Indications: pain   blood sugar diagnostic (ONETOUCH ULTRA TEST) test strip Use to check blood sugars daily. E11.65   blood-glucose meter (ACCU-CHEK GUIDE GLUCOSE METER) kit Use one strip as directed daily. Diagnosis Code: DM-2   carvediloL (COREG) 6.25 mg tablet take 2 tablets BY MOUTH TWICE DAILY WITH MEALS   cholecalciferol(+) (VITAMIN D3) 2,000 unit  tablet Take one tablet by mouth daily.   cyanocobalamin (vitamin B-12) 3,000 mcg cap Take one capsule by mouth daily.   ezetimibe (ZETIA) 10 mg tablet TAKE 1 TABLET BY MOUTH once daily   ferrous sulfate (SLOW RELEASE IRON) 142 mg (45 mg iron) ER tablet Take one tablet by mouth daily.   furosemide (LASIX) 20 mg tablet Take one tablet by mouth daily as needed.   icosapent ethyL (VASCEPA) 1 gram capsule Take two capsules by mouth twice daily with meals.   lancets 33 gauge 33 gauge Use to check blood sugars daily E11.65   Leg Brace misc Bilateral Fitted leg brace and right shoe for foot drop.  Dx: spina bifida.   levETIRAcetam (KEPPRA) 500 mg tablet Take one tablet by mouth twice daily.   loperamide (IMODIUM A-D) 2 mg capsule Take one capsule by mouth as Needed for Diarrhea.   losartan (COZAAR) 50 mg tablet Take one tablet by mouth daily.   metFORMIN-XR (GLUCOPHAGE XR) 500 mg extended release tablet Take one tablet by mouth daily with breakfast AND two tablets daily with dinner.   Miscellaneous Medical Supply misc CPAP- auto PAP with heated humdification, 4-20, with mask of patient's choice.  Indications: severe sleep apnea   Miscellaneous Medical Supply misc Shoe lift for right foot.  Dx: spina bifida   multivit-min/folic/vit K/lycop (ONE-A-DAY MEN'S MULTIVITAMIN PO) Take 1 tablet by mouth at bedtime daily.   Ostomy Supplies 1  misc [employer] 1 piece Urostomy bag.  Use 1 bag as needed.   psyllium seed (with sugar) (FIBER PO) Take 1 tablet by mouth twice daily.   rosuvastatin (CRESTOR) 40 mg tablet TAKE 1 TABLET BY MOUTH at bedtime   sennosides-docusate sodium (SENOKOT-S) 8.6/50 mg tablet Take one tablet by mouth twice daily. Indications: constipation  Patient not taking: Reported on 01/27/2023   spironolactone (ALDACTONE) 25 mg tablet Take two tablets by mouth daily. Take with food   tirzepatide (MOUNJARO) 12.5 mg/0.5 mL injector PEN Inject 0.5 mL under the skin every 7 days.   triamcinolone acetonide (TRIDERM) 0.1 % topical cream Apply  topically to affected area twice daily.  Patient not taking: Reported on 01/27/2023      Education  Education provided: not necessary    Time spent with patient: 20 minutes    Reece Leader, [facility]

## 2023-03-24 ENCOUNTER — Encounter: Admit: 2023-03-24 | Discharge: 2023-03-24 | Payer: MEDICARE

## 2023-03-24 DIAGNOSIS — E1165 Type 2 diabetes mellitus with hyperglycemia: Secondary | ICD-10-CM

## 2023-03-24 MED ORDER — FREESTYLE LIBRE 3 PLUS SENSOR MISC DEVI
11 refills | 90.00000 days | Status: AC
Start: 2023-03-24 — End: ?

## 2023-04-05 ENCOUNTER — Ambulatory Visit: Admit: 2023-04-05 | Discharge: 2023-04-06 | Payer: MEDICARE

## 2023-04-05 ENCOUNTER — Encounter: Admit: 2023-04-05 | Discharge: 2023-04-05 | Payer: MEDICARE

## 2023-04-05 DIAGNOSIS — E1165 Type 2 diabetes mellitus with hyperglycemia: Secondary | ICD-10-CM

## 2023-04-05 MED ORDER — MOUNJARO 12.5 MG/0.5 ML SC PNIJ
12.5 mg | SUBCUTANEOUS | 5 refills | Status: AC
Start: 2023-04-05 — End: ?
  Filled 2023-04-21: qty 2, 28d supply, fill #1

## 2023-04-05 NOTE — Progress Notes
 A follow-up comprehensive medication management visit was completed today via telephone.    Referral reason: Diabetes Management  Referring provider: Orson Gear, MD    Assessment & Plan     Diabetes  Patient's diabetes is uncontrolled as reflected by home blood sugars and A1C. Their goal glucose is pre-prandial 80-130 and post-prandial <180. Their most recent A1C was 7.8% on 12/02/2022 with a goal of < 7%    Average fasting blood sugar continues to be elevated despite being on maximally tolerated dose of Mounjaro and metformin.     Patient will be starting on insulin glargine today. Provided teaching in clinic and patient was able to self administer first dose in clinic with no issues. Will provide instructions for self-titration of dose until next appt. Awaiting approval of CGM.     Had discussed SGLT-2 inhibitor previously, however, due to concerns with urosepsis and recurrent UTIs, will avoid this agent at this time.       Plan  - START Lantus 14 units once daily - first dose administered today in clinic  - START using CGM - will bring into clinic for teaching if covered by insurance  - Continue Mounjaro 12.5 mg once a week   - Continue metformin XR 500 mg AM and 1000 mg PM    Follow-up  The patient will continue to follow up with the pharmacist. Return to pharmacist in 2 weeks via telephone. The return visit will be scheduled at a later date.    Subjective & Objective      Patient was accompanied by his case worker, Carmel Sacramento.   Reported his fasting blood sugar was 300s this morning, fasting. He thinks it was due to eating late last night. Usually his fasting BG range 160s-170s.     Brought in insulin glargine pen for teaching today.     Did discuss SGLT-2 inhibitor with cardiologist. Molli Knock from cardiac standpoint, concerned about his kidneys and history of recurrent infections/urosepsis.    Weight trend:  12/17 - 258 lb without his braces (5-7 lb)    Hasn't been very physically active, hasn't been in the gym, would like to start going back with his brother    Eating 2-3 meals a day. Lunch is usually 12:45 - dinner is around 5-5:30     Diabetes    HPI  Current diabetes medication regimen:   - metformin XR 500 mg in the morning and 1000 mg in the evening   - Mounjaro 12.5 mg once a week on Tuesdays  - insulin glargine - not yet started  Previous medications for diabetes:   - Mounjaro 15 mg - unable to tolerate   - Trulicity - switched to [employer]   - metformin IR - diarrhea   - Januvia - switched to Trulicity     Blood Glucose  Monitoring times: fasting  The baseline HbA1C was 9.4 on 07/07/2020.    Patient reported readings:  Previous fasting avg 160    Diet and Exercise  Number of meals per day: 2  Breakfast: doesn't usually eat breakfast  Lunch: chicken salad  Dinner: mac and cheese, chicken salad, veggies, usually fairly carb heavy   Snacks: sometimes will snack throughout the day - popcorn, cookies, rice crispy, potato chips   Drinks: if not going out and wants something with his meal then will drink soda, but when he goes out will have water  Last meal of the day is usually 5 pm     Comorbidities  ASCVD condition(s):  none  CKD: no  Heart failure: no    Obesity: yes    Health Maintenance  Aspirin utilization: Patient is not taking aspirin due to the following reason: not indicated.  Statin utilization: Patient is taking a statin.  Hypertension: Patient has hypertension, which is not controlled.    Primary Care - Labs   Basic Metabolic Profile    Lab Results   Component Value Date/Time    NA 140 01/04/2023 11:45 AM    K 3.9 01/04/2023 11:45 AM    CA 9.5 01/04/2023 11:45 AM    CL 103 01/04/2023 11:45 AM    CO2 30 01/04/2023 11:45 AM    GAP 7 01/04/2023 11:45 AM    EGFR1 >60 09/01/2022 03:14 PM    Lab Results   Component Value Date/Time    BUN 17 01/04/2023 11:45 AM    CR 0.68 01/04/2023 11:45 AM    GLU 220 (H) 01/04/2023 11:45 AM         Lab Draw:  Lab Results   Component Value Date/Time    HGBA1C 8.8 (H) 09/01/2022 03:14 PM    HGBA1C 8.0 (H) 10/28/2021 02:42 PM    HGBA1C 8.6 (H) 04/29/2021 12:32 PM     POC:  Lab Results   Component Value Date/Time    A1C 7.8 (A) 12/02/2022 12:00 AM    A1C 8.3 (A) 04/27/2022 12:00 AM        No results found for: MCALB24   Microalbumin/CR ratio Urine   Date Value Ref Range Status   12/02/2022 92.5 (H) <30.0 ?g/mg Final     Urine Microalbumin   Date Value Ref Range Status   12/02/2022 84.2 ?g/mL Final        Lab Results   Component Value Date    CHOL 118 12/02/2022    TRIG 178 (H) 12/02/2022    HDL 36 (L) 12/02/2022    LDL 66 12/02/2022    VLDL 98.1 12/02/2022    NONHDLCHOL 82 12/02/2022    CHOLHDLC 6.9 (H) 10/29/2020         BP Readings from Last 1 Encounters:   01/27/23 103/72      Medication History  Medication history was not completed because previously completed (follow-up visit).    Adverse Drug Reactions  Adverse drug reactions were reviewed with the patient.    Significant adverse drug reaction(s) were not identified.    Side effect(s) were not reported.    The patient is filling their medications through 2 pharmacies due to the following reason(s): patient preference. Patient is currently using [facility] pharmacy. Patient is currently using [facility] based on patient preference.    Home Medications    Medication Sig   acetaminophen (TYLENOL EXTRA STRENGTH) 500 mg tablet Take two tablets by mouth every 6 hours as needed for Pain. Max of 4,000 mg of acetaminophen in 24 hours.  Indications: pain   blood sugar diagnostic (ONETOUCH ULTRA TEST) test strip Use to check blood sugars daily. E11.65   blood-glucose meter (ACCU-CHEK GUIDE GLUCOSE METER) kit Use one strip as directed daily. Diagnosis Code: DM-2   blood-glucose sensor (FREESTYLE LIBRE 3 PLUS SENSOR) sensor device Use to check blood sugars continuously. Replace every 15 days.   carvediloL (COREG) 6.25 mg tablet take 2 tablets BY MOUTH TWICE DAILY WITH MEALS   cholecalciferol(+) (VITAMIN D3) 2,000 unit tablet Take one tablet by mouth daily.   cyanocobalamin (vitamin B-12) 3,000 mcg cap Take one capsule by mouth  daily.   ezetimibe (ZETIA) 10 mg tablet TAKE 1 TABLET BY MOUTH once daily   ferrous sulfate (SLOW RELEASE IRON) 142 mg (45 mg iron) ER tablet Take one tablet by mouth daily.   furosemide (LASIX) 20 mg tablet Take one tablet by mouth daily as needed.   icosapent ethyL (VASCEPA) 1 gram capsule Take two capsules by mouth twice daily with meals.   insulin glargine (LANTUS SOLOSTAR U-100 INSULIN) 100 unit/mL (3 mL) subcutaneous PEN Inject ten Units to fifty Units under the skin at bedtime daily. Max 50 units/day   insulin pen needles (disposable) (COMFORT EZ PEN NEEDLES) 31 gauge x 5/16 pen needle Use to inject insulin 1x/day   lancets 33 gauge 33 gauge Use to check blood sugars daily E11.65   Leg Brace misc Bilateral Fitted leg brace and right shoe for foot drop.  Dx: spina bifida.   levETIRAcetam (KEPPRA) 500 mg tablet Take one tablet by mouth twice daily.   loperamide (IMODIUM A-D) 2 mg capsule Take one capsule by mouth as Needed for Diarrhea.   losartan (COZAAR) 50 mg tablet Take one tablet by mouth daily.   metFORMIN-XR (GLUCOPHAGE XR) 500 mg extended release tablet Take one tablet by mouth daily with breakfast AND two tablets daily with dinner.   Miscellaneous Medical [employer] misc CPAP- auto PAP with heated humdification, 4-20, with mask of patient's choice.  Indications: severe sleep apnea   Miscellaneous Medical Supply misc Shoe lift for right foot.  Dx: spina bifida   multivit-min/folic/vit K/lycop (ONE-A-DAY MEN'S MULTIVITAMIN PO) Take 1 tablet by mouth at bedtime daily.   Ostomy Supplies 1  misc [employer] 1 piece Urostomy bag.  Use 1 bag as needed.   psyllium seed (with sugar) (FIBER PO) Take 1 tablet by mouth twice daily.   rosuvastatin (CRESTOR) 40 mg tablet TAKE 1 TABLET BY MOUTH at bedtime   sennosides-docusate sodium (SENOKOT-S) 8.6/50 mg tablet Take one tablet by mouth twice daily. Indications: constipation  Patient not taking: Reported on 01/27/2023   spironolactone (ALDACTONE) 25 mg tablet take 2 tablets BY MOUTH ONCE daily WITH FOOD   tirzepatide (MOUNJARO) 12.5 mg/0.5 mL injector PEN Inject 0.5 mL under the skin every 7 days.   triamcinolone acetonide (TRIDERM) 0.1 % topical cream Apply  topically to affected area twice daily.  Patient not taking: Reported on 01/27/2023      Education  Education provided: not necessary    Time spent with patient: 20 minutes    Reece Leader, [facility]

## 2023-04-05 NOTE — Patient Instructions
-   START insulin glargine 14 units once daily in the morning. Increase dose by 2 units EVERY 3 DAYS IF your fasting blood sugar is still >130 mg/dL. Once your fasting blood sugar is <130, then stay at that dose until we talk.     Continue Mounjaro 12.5 mg once a week on Tuesdays   Continue metformin XR 1 tablet in the morning and 2 tablets in the evening

## 2023-04-07 ENCOUNTER — Encounter: Admit: 2023-04-07 | Discharge: 2023-04-07 | Payer: MEDICARE

## 2023-04-07 NOTE — Progress Notes
 Fax received, completed, and faxed back to # [phone].

## 2023-04-08 ENCOUNTER — Encounter: Admit: 2023-04-08 | Discharge: 2023-04-08

## 2023-04-11 ENCOUNTER — Encounter: Admit: 2023-04-11 | Discharge: 2023-04-11

## 2023-04-11 MED ORDER — ICOSAPENT ETHYL 1 GRAM PO CAP
2 g | ORAL_CAPSULE | Freq: Two times a day (BID) | ORAL | 1 refills | Status: AC
Start: 2023-04-11 — End: ?
  Filled 2023-05-11: qty 50, 30d supply, fill #4

## 2023-04-11 NOTE — Telephone Encounter
 Medication Name: Tillman Sers  Last office  visit: 12/02/2022  Next office visit: 06/02/2023  Last labs drawn: 12/02/2022    Routing to Dr. Larina Bras for approval/denial.

## 2023-04-15 ENCOUNTER — Encounter: Admit: 2023-04-15 | Discharge: 2023-04-15

## 2023-04-19 ENCOUNTER — Ambulatory Visit: Admit: 2023-04-19 | Discharge: 2023-04-20

## 2023-04-19 NOTE — Progress Notes
 A follow-up comprehensive medication management visit was completed today via telephone.    Referral reason: Diabetes Management  Referring provider: Mosetta Areola, MD    Assessment & Plan     Diabetes  Patient's diabetes is uncontrolled as reflected by home blood sugars and A1C. Their goal glucose is pre-prandial 80-130 and post-prandial <180. Their most recent A1C was 7.8% on 12/02/2022 with a goal of < 7%    Average fasting blood sugars continue to be significantly elevated despite starting on insulin glargine, likely due to dietary indiscretions. Discussed with patient to cut down on [place]  sweet tea, do not drink more than 1 can per day and cut down to 1 can every other day. He is agreeable to making this change.     Will adjust insulin dose at this time. Will have patient come in tomorrow for CGM set up, which will allow for closer blood sugar monitoring.     Patient was not able to tolerate Mounjaro 15 mg. Had discussed SGLT-2 inhibitor previously, however, due to concerns with urosepsis and recurrent UTIs, will avoid this agent at this time.       Plan  - INCREASE Lantus 20 units once daily   - START using CGM - will bring into clinic for teaching and set up  - Continue Mounjaro 12.5 mg once a week   - Continue metformin XR 500 mg AM and 1000 mg PM    Follow-up  The patient will continue to follow up with the pharmacist. Return to pharmacist in 1 day in person. The return visit was scheduled during today's visit.    Subjective & Objective      Reported his blood sugars have not been good due to not eating great. Has been eating noodles and co more. Will go back to eating fish and chicken that are not fried.  Has been eating dinner later than usual. Has been drinking more [place]  sweet tea, sometimes drinking 2 cans.    Low 158   High 222  Just got Freestyle Libre sensors this morning.   Has google pixel 7 phone and plans to use app on this phone to monitor.    Weight trend:  12/17 - 258 lb without his braces (5-7 lb)    Diabetes    HPI  Current diabetes medication regimen:   - metformin XR 500 mg in the morning and 1000 mg in the evening   - Mounjaro 12.5 mg once a week on Tuesdays  - insulin glargine 16 units once daily in the morning at 9 am - increased this past Saturday  Previous medications for diabetes:   - Mounjaro 15 mg - unable to tolerate   - Trulicity - switched to Mounjaro   - metformin IR - diarrhea   - Januvia - switched to Trulicity     Blood Glucose  Monitoring times: fasting  The baseline HbA1C was 9.4 on 07/07/2020.    Patient reported readings:   fasting   25-Mar 316  26-Mar 200  27-Mar 213  28-Mar 197  29-Mar 199  30-Mar 158  31-Mar 168  1-Apr 180  2-Apr 167  3-Apr 202  4-Apr 211  5-Apr 173  6-Apr 169  7-Apr 222  8-Apr 221    Diet and Exercise  Number of meals per day: 2  Breakfast: doesn't usually eat breakfast  Lunch: chicken salad  Dinner: mac and cheese, chicken salad, veggies, usually fairly carb heavy   Snacks: sometimes will snack throughout the day -  popcorn, cookies, rice crispy, potato chips   Drinks: if not going out and wants something with his meal then will drink soda, but when he goes out will have water  Last meal of the day is usually 5 pm     Comorbidities  ASCVD condition(s): none  CKD: no  Heart failure: no    Obesity: yes    Health Maintenance  Aspirin utilization: Patient is not taking aspirin due to the following reason: not indicated.  Statin utilization: Patient is taking a statin.  Hypertension: Patient has hypertension, which is not controlled.    Primary Care - Labs   Basic Metabolic Profile    Lab Results   Component Value Date/Time    NA 140 01/04/2023 11:45 AM    K 3.9 01/04/2023 11:45 AM    CA 9.5 01/04/2023 11:45 AM    CL 103 01/04/2023 11:45 AM    CO2 30 01/04/2023 11:45 AM    GAP 7 01/04/2023 11:45 AM    EGFR1 >60 09/01/2022 03:14 PM    Lab Results   Component Value Date/Time    BUN 17 01/04/2023 11:45 AM    CR 0.68 01/04/2023 11:45 AM    GLU 220 (H) 01/04/2023 11:45 AM         Lab Draw:  Lab Results   Component Value Date/Time    HGBA1C 8.8 (H) 09/01/2022 03:14 PM    HGBA1C 8.0 (H) 10/28/2021 02:42 PM    HGBA1C 8.6 (H) 04/29/2021 12:32 PM     POC:  Lab Results   Component Value Date/Time    A1C 7.8 (A) 12/02/2022 12:00 AM    A1C 8.3 (A) 04/27/2022 12:00 AM        No results found for: MCALB24   Microalbumin/CR ratio Urine   Date Value Ref Range Status   12/02/2022 92.5 (H) <30.0 ?g/mg Final     Urine Microalbumin   Date Value Ref Range Status   12/02/2022 84.2 ?g/mL Final        Lab Results   Component Value Date    CHOL 118 12/02/2022    TRIG 178 (H) 12/02/2022    HDL 36 (L) 12/02/2022    LDL 66 12/02/2022    VLDL 16.1 12/02/2022    NONHDLCHOL 82 12/02/2022    CHOLHDLC 6.9 (H) 10/29/2020         BP Readings from Last 1 Encounters:   01/27/23 103/72      Medication History  Medication history was not completed because previously completed (follow-up visit).    Adverse Drug Reactions  Adverse drug reactions were reviewed with the patient.    Significant adverse drug reaction(s) were not identified.    Side effect(s) were not reported.    The patient is filling their medications through 2 pharmacies due to the following reason(s): patient preference. Patient is currently using [facility] pharmacy. Patient is currently using [facility] based on patient preference.    Home Medications    Medication Sig   acetaminophen (TYLENOL EXTRA STRENGTH) 500 mg tablet Take two tablets by mouth every 6 hours as needed for Pain. Max of 4,000 mg of acetaminophen in 24 hours.  Indications: pain   blood sugar diagnostic (ONETOUCH ULTRA TEST) test strip Use to check blood sugars daily. E11.65   blood-glucose meter (ACCU-CHEK GUIDE GLUCOSE METER) kit Use one strip as directed daily. Diagnosis Code: DM-2   blood-glucose sensor (FREESTYLE LIBRE 3 PLUS SENSOR) sensor device Use to check blood  sugars continuously. Replace every 15 days.   carvediloL (COREG) 6.25 mg tablet take 2 tablets BY MOUTH TWICE DAILY WITH MEALS   cholecalciferol(+) (VITAMIN D3) 2,000 unit tablet Take one tablet by mouth daily.   cyanocobalamin (vitamin B-12) 3,000 mcg cap Take one capsule by mouth daily.   ezetimibe (ZETIA) 10 mg tablet TAKE 1 TABLET BY MOUTH once daily   ferrous sulfate (SLOW RELEASE IRON) 142 mg (45 mg iron) ER tablet Take one tablet by mouth daily.   furosemide (LASIX) 20 mg tablet Take one tablet by mouth daily as needed.   icosapent ethyL (VASCEPA) 1 gram capsule Take two capsules by mouth twice daily with meals.   insulin glargine (LANTUS SOLOSTAR U-100 INSULIN) 100 unit/mL (3 mL) subcutaneous PEN Inject ten Units to fifty Units under the skin at bedtime daily. Max 50 units/day  Patient taking differently: Inject fourteen Units under the skin at bedtime daily. Max 50 units/day   insulin pen needles (disposable) (COMFORT EZ PEN NEEDLES) 31 gauge x 5/16 pen needle Use to inject insulin 1x/day   lancets 33 gauge 33 gauge Use to check blood sugars daily E11.65   Leg Brace misc Bilateral Fitted leg brace and right shoe for foot drop.  Dx: spina bifida.   levETIRAcetam (KEPPRA) 500 mg tablet Take one tablet by mouth twice daily.   loperamide (IMODIUM A-D) 2 mg capsule Take one capsule by mouth as Needed for Diarrhea.   losartan (COZAAR) 50 mg tablet Take one tablet by mouth daily.   metFORMIN-XR (GLUCOPHAGE XR) 500 mg extended release tablet Take one tablet by mouth daily with breakfast AND two tablets daily with dinner.   Miscellaneous Medical Supply misc CPAP- auto PAP with heated humdification, 4-20, with mask of patient's choice.  Indications: severe sleep apnea   Miscellaneous Medical Supply misc Shoe lift for right foot.  Dx: spina bifida   multivit-min/folic/vit K/lycop (ONE-A-DAY MEN'S MULTIVITAMIN PO) Take 1 tablet by mouth at bedtime daily.   Ostomy Supplies 1  misc [employer] 1 piece Urostomy bag.  Use 1 bag as needed.   psyllium seed (with sugar) (FIBER PO) Take 1 tablet by mouth twice daily.   rosuvastatin (CRESTOR) 40 mg tablet TAKE 1 TABLET BY MOUTH at bedtime   sennosides-docusate sodium (SENOKOT-S) 8.6/50 mg tablet Take one tablet by mouth twice daily. Indications: constipation  Patient not taking: Reported on 01/27/2023   spironolactone (ALDACTONE) 25 mg tablet take 2 tablets BY MOUTH ONCE daily WITH FOOD   tirzepatide (MOUNJARO) 12.5 mg/0.5 mL injector PEN Inject 0.5 mL under the skin every 7 days.   triamcinolone acetonide (TRIDERM) 0.1 % topical cream Apply  topically to affected area twice daily.  Patient not taking: Reported on 01/27/2023      Education  Education provided: not necessary    Time spent with patient: 20 minutes    Kasarah Sitts, PHARMD

## 2023-04-20 ENCOUNTER — Encounter: Admit: 2023-04-20 | Discharge: 2023-04-20

## 2023-04-20 ENCOUNTER — Ambulatory Visit: Admit: 2023-04-20 | Discharge: 2023-04-21

## 2023-04-22 ENCOUNTER — Encounter: Admit: 2023-04-22 | Discharge: 2023-04-22 | Payer: MEDICARE

## 2023-04-27 ENCOUNTER — Encounter: Admit: 2023-04-27 | Discharge: 2023-04-27 | Payer: MEDICARE

## 2023-04-29 ENCOUNTER — Encounter: Admit: 2023-04-29 | Discharge: 2023-04-29 | Payer: MEDICARE

## 2023-05-03 ENCOUNTER — Ambulatory Visit: Admit: 2023-05-03 | Discharge: 2023-05-04 | Payer: MEDICARE

## 2023-05-03 NOTE — Progress Notes
 A follow-up comprehensive medication management visit was completed today via telephone.    Referral reason: Diabetes Management  Referring provider: Mosetta Areola, MD    Assessment & Plan     Diabetes  Patient's diabetes is uncontrolled as reflected by home blood sugars and A1C. Their goal glucose is pre-prandial 80-130 and post-prandial <180. Their most recent A1C was 7.8% on 12/02/2022 with a goal of < 7%    Cgm report show patient having significantly elevated post-prandial blood sugars with TIR 55%, average glucose 186 mg/dL, and GMI 0.9%.    Patient will work on making dietary change, recommended decrease carb count per meal, decrease portion size, decrease consumption of sweet iced tea.     Patient was not able to tolerate Mounjaro 15 mg. Had discussed SGLT-2 inhibitor previously, however, due to concerns with urosepsis and recurrent UTIs, will avoid this agent at this time.     Plan  - INCREASE Lantus to 28 units if fasting BG >130  - Continue Mounjaro 12.5 mg once a week   - Continue metformin XR 500 mg AM and 1000 mg PM    Follow-up  The patient will continue to follow up with the pharmacist. Return to pharmacist in 4 days via telephone. The return visit was scheduled during today's visit.    Subjective & Objective      Reported his new sensor has been working well with no issues.   The last sensor was having readings that were much higher than readings via fingerstick.   Trying to track his food intake via an app. Trying to eat healthier.   Reported he had spaghetti yesterday with 3 cans of [place]  sweet tea (12 oz cans)  Insulin dose increased recently by PCP when he sent CGM report    Did increase insulin dose to 20 units this morning. Blood sugar this morning was 200s before eating. Not sure why his blood sugars have been so high, thinks dietary related, will try to cut out [place]  sweet tea.     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   Has google pixel 7 phone and plans to use app on this phone to monitor.  Was able to set up account on his [place] app.    Weight trend:  12/17 - 258 lb without his braces (5-7 lb)    Diabetes    HPI  Current diabetes medication regimen:   - metformin XR 500 mg in the morning and 1000 mg in the evening   - Mounjaro 12.5 mg once a week on Tuesdays  - insulin glargine 26 units once daily in the morning at 9 am - increased dose 2 days ago  Previous medications for diabetes:   - Mounjaro 15 mg - unable to tolerate   - Trulicity - switched to Mounjaro   - metformin IR - diarrhea   - Januvia - switched to Trulicity     Blood Glucose  Monitoring times: fasting  The baseline HbA1C was 9.4 on 07/07/2020.    Diet and Exercise  Number of meals per day: 2  Breakfast: doesn't usually eat breakfast  Lunch: chicken salad  Dinner: mac and cheese, chicken salad, veggies, usually fairly carb heavy   Snacks: sometimes will snack throughout the day - popcorn, cookies, rice crispy, potato chips   Drinks: [place]  lite half and half tea - 12 oz can, sometimes will hvae 2-3 /day  Last meal of the day is usually 5 pm  Comorbidities  ASCVD condition(s): none  CKD: no  Heart failure: no    Obesity: yes    Health Maintenance  Aspirin  utilization: Patient is not taking aspirin  due to the following reason: not indicated.  Statin utilization: Patient is taking a statin.  Hypertension: Patient has hypertension, which is not controlled.      -----------------------------  Continuous Glucose Monitor Data:      05/03/2023   Glucose Management Indicator   Start Date of Blood Glucose Data 04/20/2023   End Date of Blood Glucose Measurements 05/03/2023   Days of Blood Glucose Data 14   Percent of Time Active 93 %   Glucose Management Indicator 7.8 %   % time very high (above 250) 17 %   % time high (180-250) 28 %   % time in range (70-180) 55 %   % time low (54-70) 0 %   % time very low (below 54) 0 %      Primary Care - Labs   Basic Metabolic Profile    Lab Results   Component Value Date/Time    NA 140 01/04/2023 11:45 AM    K 3.9 01/04/2023 11:45 AM    CA 9.5 01/04/2023 11:45 AM    CL 103 01/04/2023 11:45 AM    CO2 30 01/04/2023 11:45 AM    GAP 7 01/04/2023 11:45 AM    EGFR1 >60 09/01/2022 03:14 PM    Lab Results   Component Value Date/Time    BUN 17 01/04/2023 11:45 AM    CR 0.68 01/04/2023 11:45 AM    GLU 220 (H) 01/04/2023 11:45 AM         Lab Draw:  Lab Results   Component Value Date/Time    HGBA1C 8.8 (H) 09/01/2022 03:14 PM    HGBA1C 8.0 (H) 10/28/2021 02:42 PM    HGBA1C 8.6 (H) 04/29/2021 12:32 PM     POC:  Lab Results   Component Value Date/Time    A1C 7.8 (A) 12/02/2022 12:00 AM    A1C 8.3 (A) 04/27/2022 12:00 AM        No results found for: MCALB24   Microalbumin/CR ratio Urine   Date Value Ref Range Status   12/02/2022 92.5 (H) <30.0 ?g/mg Final     Urine Microalbumin   Date Value Ref Range Status   12/02/2022 84.2 ?g/mL Final        Lab Results   Component Value Date    CHOL 118 12/02/2022    TRIG 178 (H) 12/02/2022    HDL 36 (L) 12/02/2022    LDL 66 12/02/2022    VLDL 16.1 12/02/2022    NONHDLCHOL 82 12/02/2022    CHOLHDLC 6.9 (H) 10/29/2020         BP Readings from Last 1 Encounters:   01/27/23 103/72      Medication History  Medication history was not completed because previously completed (follow-up visit).    Adverse Drug Reactions  Adverse drug reactions were reviewed with the patient.    Significant adverse drug reaction(s) were not identified.    Side effect(s) were not reported.    The patient is filling their medications through 2 pharmacies due to the following reason(s): patient preference. Patient is currently using [facility]. Patient is currently using [facility] based on patient preference.    Home Medications    Medication Sig   acetaminophen  (TYLENOL  EXTRA STRENGTH) 500 mg tablet Take two tablets by mouth every 6 hours as  needed for Pain. Max of 4,000 mg of acetaminophen in 24 hours.  Indications: pain   blood sugar diagnostic (ONETOUCH ULTRA TEST) test strip Use to check blood sugars daily. E11.65   blood-glucose meter (ACCU-CHEK GUIDE GLUCOSE METER) kit Use one strip as directed daily. Diagnosis Code: DM-2   blood-glucose sensor (FREESTYLE LIBRE 3 PLUS SENSOR) sensor device Use to check blood sugars continuously. Replace every 15 days.   carvediloL (COREG) 6.25 mg tablet take 2 tablets BY MOUTH TWICE DAILY WITH MEALS   cholecalciferol(+) (VITAMIN D3) 2,000 unit tablet Take one tablet by mouth daily.   cyanocobalamin (vitamin B-12) 3,000 mcg cap Take one capsule by mouth daily.   ezetimibe (ZETIA) 10 mg tablet TAKE 1 TABLET BY MOUTH once daily   ferrous sulfate (SLOW RELEASE IRON) 142 mg (45 mg iron) ER tablet Take one tablet by mouth daily.   furosemide (LASIX) 20 mg tablet Take one tablet by mouth daily as needed.   icosapent ethyL (VASCEPA) 1 gram capsule Take two capsules by mouth twice daily with meals.   insulin glargine (LANTUS SOLOSTAR U-100 INSULIN) 100 unit/mL (3 mL) subcutaneous PEN Inject ten Units to fifty Units under the skin at bedtime daily. Max 50 units/day  Patient taking differently: Inject twenty Units under the skin at bedtime daily. Max 50 units/day   insulin pen needles (disposable) (COMFORT EZ PEN NEEDLES) 31 gauge x 5/16 pen needle Use to inject insulin 1x/day   lancets 33 gauge 33 gauge Use to check blood sugars daily E11.65   Leg Brace misc Bilateral Fitted leg brace and right shoe for foot drop.  Dx: spina bifida.   levETIRAcetam (KEPPRA) 500 mg tablet Take one tablet by mouth twice daily.   loperamide (IMODIUM A-D) 2 mg capsule Take one capsule by mouth as Needed for Diarrhea.   losartan (COZAAR) 50 mg tablet Take one tablet by mouth daily.   metFORMIN-XR (GLUCOPHAGE XR) 500 mg extended release tablet Take one tablet by mouth daily with breakfast AND two tablets daily with dinner.   Miscellaneous Medical Supply misc CPAP- auto PAP with heated humdification, 4-20, with mask of patient's choice.  Indications: severe sleep apnea   Miscellaneous Medical Supply misc Shoe lift for right foot.  Dx: spina bifida   multivit-min/folic/vit K/lycop (ONE-A-DAY MEN'S MULTIVITAMIN PO) Take 1 tablet by mouth at bedtime daily.   Ostomy Supplies 1  misc [employer] 1 piece Urostomy bag.  Use 1 bag as needed.   psyllium seed (with sugar) (FIBER PO) Take 1 tablet by mouth twice daily.   rosuvastatin (CRESTOR) 40 mg tablet TAKE 1 TABLET BY MOUTH at bedtime   sennosides-docusate sodium (SENOKOT-S) 8.6/50 mg tablet Take one tablet by mouth twice daily. Indications: constipation  Patient not taking: Reported on 01/27/2023   spironolactone (ALDACTONE) 25 mg tablet take 2 tablets BY MOUTH ONCE daily WITH FOOD   tirzepatide (MOUNJARO) 12.5 mg/0.5 mL injector PEN Inject 0.5 mL under the skin every 7 days.   triamcinolone acetonide (TRIDERM) 0.1 % topical cream Apply  topically to affected area twice daily.  Patient not taking: Reported on 01/27/2023      Education  Education provided: not necessary    Time spent with patient: 30 minutes    Farrah Skoda, PHARMD

## 2023-05-11 ENCOUNTER — Encounter: Admit: 2023-05-11 | Discharge: 2023-05-11 | Payer: MEDICARE

## 2023-05-18 ENCOUNTER — Encounter: Admit: 2023-05-18 | Discharge: 2023-05-18 | Payer: MEDICARE

## 2023-05-18 DIAGNOSIS — E119 Type 2 diabetes mellitus without complications: Secondary | ICD-10-CM

## 2023-05-18 MED ORDER — METFORMIN 500 MG PO TB24
500 mg | ORAL_TABLET | Freq: Two times a day (BID) | ORAL | 0 refills | 90.00000 days | Status: AC
Start: 2023-05-18 — End: ?

## 2023-05-18 NOTE — Telephone Encounter
 Last seen CPX 12-02-22, pend OV 06-02-23

## 2023-05-23 ENCOUNTER — Encounter: Admit: 2023-05-23 | Discharge: 2023-05-23 | Payer: MEDICARE

## 2023-05-23 DIAGNOSIS — Q050 Cervical spina bifida with hydrocephalus: Secondary | ICD-10-CM

## 2023-05-23 MED ORDER — LEG BRACE MISC MISC
ORAL | 0 refills | Status: AC
Start: 2023-05-23 — End: ?

## 2023-05-24 ENCOUNTER — Encounter: Admit: 2023-05-24 | Discharge: 2023-05-24 | Payer: MEDICARE

## 2023-05-24 MED ORDER — LOSARTAN 50 MG PO TAB
50 mg | ORAL_TABLET | Freq: Every day | ORAL | 3 refills | 90.00000 days | Status: AC
Start: 2023-05-24 — End: ?

## 2023-05-31 ENCOUNTER — Ambulatory Visit: Admit: 2023-05-31 | Discharge: 2023-06-01 | Payer: MEDICARE

## 2023-05-31 ENCOUNTER — Encounter: Admit: 2023-05-31 | Discharge: 2023-05-31 | Payer: MEDICARE

## 2023-05-31 MED ORDER — LANTUS SOLOSTAR U-100 INSULIN 100 UNIT/ML (3 ML) SC INPN
26-50 [IU] | Freq: Every evening | SUBCUTANEOUS | 5 refills | 68.00000 days | Status: AC
Start: 2023-05-31 — End: ?

## 2023-05-31 NOTE — Progress Notes
 A follow-up comprehensive medication management visit was completed today via telephone.    Referral reason: Diabetes Management  Referring provider: Mosetta Areola, MD    Assessment & Plan     Diabetes  Patient's diabetes is controlled as reflected by home blood sugars. Their goal glucose is pre-prandial 80-130 and post-prandial <180. Their most recent A1C was 7.8% on 12/02/2022 with a goal of < 7%    Reported blood sugars are at goal. Doing well on current regimen,will continue current doses.     Patient has PCP appt this week and will be due to A1C repeat.     Patient was not able to tolerate Mounjaro 15 mg. Had discussed SGLT-2 inhibitor previously, however, due to concerns with urosepsis and recurrent UTIs, will avoid this agent at this time.     Plan  - Continue Lantus 26 units once daily   - Continue Mounjaro 12.5 mg once a week   - Continue metformin XR 500 mg AM and 1000 mg PM    Follow-up  The patient will continue to follow up with the pharmacist. Return to pharmacist in 6 days via telephone. The return visit was scheduled during today's visit.    Subjective & Objective      Reported his blood sugars have been doing very well.   Checking blood sugars via fingerstick. Plans on putting on a new libre 3 plus sensor over the next few days.  Has not had any s/sx of hypglycemia.     Weight trend:  12/17 - 258 lb without his braces (5-7 lb)    Diabetes    HPI  Current diabetes medication regimen:   - metformin XR 500 mg in the morning and 1000 mg in the evening   - Mounjaro 12.5 mg once a week on Tuesdays  - insulin glargine 26 units once daily in the morning at 9 am  Previous medications for diabetes:   - Mounjaro 15 mg - unable to tolerate   - Trulicity - switched to Mounjaro   - metformin IR - diarrhea   - Januvia - switched to Trulicity     Blood Glucose  Monitoring times: fasting  The baseline HbA1C was 9.4 on 07/07/2020.    Patient reported readings:  Fasting 83-101    Diet and Exercise  Number of meals per day: 2  Breakfast: doesn't usually eat breakfast  Lunch: chicken salad  Dinner: mac and cheese, chicken salad, veggies, usually fairly carb heavy   Snacks: sometimes will snack throughout the day - popcorn, cookies, rice crispy, potato chips   Drinks: [place]  lite half and half tea - 12 oz can, sometimes will hvae 2-3 /day  Last meal of the day is usually 5 pm     Comorbidities  ASCVD condition(s): none  CKD: no  Heart failure: no    Obesity: yes    Health Maintenance  Aspirin utilization: Patient is not taking aspirin due to the following reason: not indicated.  Statin utilization: Patient is taking a statin.  Hypertension: Patient has hypertension, which is not controlled.      -----------------------------  Continuous Glucose Monitor Data:      05/03/2023   Glucose Management Indicator   Start Date of Blood Glucose Data 04/20/2023   End Date of Blood Glucose Measurements 05/03/2023   Days of Blood Glucose Data 14   Percent of Time Active 93 %   Glucose Management Indicator 7.8 %   % time very high (above 250) 17 %   %  time high (180-250) 28 %   % time in range (70-180) 55 %   % time low (54-70) 0 %   % time very low (below 54) 0 %      Primary Care - Labs   Basic Metabolic Profile    Lab Results   Component Value Date/Time    NA 140 01/04/2023 11:45 AM    K 3.9 01/04/2023 11:45 AM    CA 9.5 01/04/2023 11:45 AM    CL 103 01/04/2023 11:45 AM    CO2 30 01/04/2023 11:45 AM    GAP 7 01/04/2023 11:45 AM    EGFR1 >60 09/01/2022 03:14 PM    Lab Results   Component Value Date/Time    BUN 17 01/04/2023 11:45 AM    CR 0.68 01/04/2023 11:45 AM    GLU 220 (H) 01/04/2023 11:45 AM         Lab Draw:  Lab Results   Component Value Date/Time    HGBA1C 8.8 (H) 09/01/2022 03:14 PM    HGBA1C 8.0 (H) 10/28/2021 02:42 PM    HGBA1C 8.6 (H) 04/29/2021 12:32 PM     POC:  Lab Results   Component Value Date/Time    A1C 7.8 (A) 12/02/2022 12:00 AM    A1C 8.3 (A) 04/27/2022 12:00 AM        No results found for: MCALB24   Microalbumin/CR ratio Urine   Date Value Ref Range Status   12/02/2022 92.5 (H) <30.0 ?g/mg Final     Urine Microalbumin   Date Value Ref Range Status   12/02/2022 84.2 ?g/mL Final        Lab Results   Component Value Date    CHOL 118 12/02/2022    TRIG 178 (H) 12/02/2022    HDL 36 (L) 12/02/2022    LDL 66 12/02/2022    VLDL 29.5 12/02/2022    NONHDLCHOL 82 12/02/2022    CHOLHDLC 6.9 (H) 10/29/2020         BP Readings from Last 1 Encounters:   01/27/23 103/72      Medication History  Medication history was not completed because previously completed (follow-up visit).    Adverse Drug Reactions  Adverse drug reactions were reviewed with the patient.    Significant adverse drug reaction(s) were not identified.    Side effect(s) were not reported.    The patient is filling their medications through 2 pharmacies due to the following reason(s): patient preference. Patient is [facility] using [facility]. Patient is currently using [facility] based on patient preference.    Home Medications    Medication Sig   acetaminophen (TYLENOL EXTRA STRENGTH) 500 mg tablet Take two tablets by mouth every 6 hours as needed for Pain. Max of 4,000 mg of acetaminophen in 24 hours.  Indications: pain   blood sugar diagnostic (ONETOUCH ULTRA TEST) test strip Use to check blood sugars daily. E11.65   blood-glucose meter (ACCU-CHEK GUIDE GLUCOSE METER) kit Use one strip as directed daily. Diagnosis Code: DM-2   blood-glucose sensor (FREESTYLE LIBRE 3 PLUS SENSOR) sensor device Use to check blood sugars continuously. Replace every 15 days.   carvediloL (COREG) 6.25 mg tablet take 2 tablets BY MOUTH TWICE DAILY WITH MEALS   cholecalciferol(+) (VITAMIN D3) 2,000 unit tablet Take one tablet by mouth daily.   cyanocobalamin (vitamin B-12) 3,000 mcg cap Take one capsule by mouth daily.   ezetimibe (ZETIA) 10 mg tablet TAKE 1 TABLET BY MOUTH once daily  ferrous sulfate (SLOW RELEASE IRON) 142 mg (45 mg iron) ER tablet Take one tablet by mouth daily.   furosemide (LASIX) 20 mg tablet Take one tablet by mouth daily as needed.   icosapent ethyL (VASCEPA) 1 gram capsule Take two capsules by mouth twice daily with meals.   insulin glargine (LANTUS SOLOSTAR U-100 INSULIN) 100 unit/mL (3 mL) subcutaneous PEN Inject ten Units to fifty Units under the skin at bedtime daily. Max 50 units/day  Patient taking differently: Inject twenty six Units to twenty eight Units under the skin at bedtime daily. Max 50 units/day   insulin pen needles (disposable) (COMFORT EZ PEN NEEDLES) 31 gauge x 5/16 pen needle Use to inject insulin 1x/day   lancets 33 gauge 33 gauge Use to check blood sugars daily E11.65   Leg Brace misc Bilateral Fitted leg brace and right shoe for foot drop.  Dx: spina bifida.   levETIRAcetam (KEPPRA) 500 mg tablet Take one tablet by mouth twice daily.   loperamide (IMODIUM A-D) 2 mg capsule Take one capsule by mouth as Needed for Diarrhea.   losartan (COZAAR) 50 mg tablet Take 1 tablet by mouth daily   metFORMIN-XR (GLUCOPHAGE XR) 500 mg extended release tablet TAKE 1 TABLET BY MOUTH TWICE DAILY with meals.   Miscellaneous Medical Supply misc CPAP- auto PAP with heated humdification, 4-20, with mask of patient's choice.  Indications: severe sleep apnea   Miscellaneous Medical Supply misc Shoe lift for right foot.  Dx: spina bifida   multivit-min/folic/vit K/lycop (ONE-A-DAY MEN'S MULTIVITAMIN PO) Take 1 tablet by mouth at bedtime daily.   Ostomy Supplies 1  misc [employer] 1 piece Urostomy bag.  Use 1 bag as needed.   psyllium seed (with sugar) (FIBER PO) Take 1 tablet by mouth twice daily.   rosuvastatin (CRESTOR) 40 mg tablet TAKE 1 TABLET BY MOUTH at bedtime   sennosides-docusate sodium (SENOKOT-S) 8.6/50 mg tablet Take one tablet by mouth twice daily. Indications: constipation  Patient not taking: Reported on 01/27/2023   spironolactone (ALDACTONE) 25 mg tablet take 2 tablets BY MOUTH ONCE daily WITH FOOD   tirzepatide (MOUNJARO) 12.5 mg/0.5 mL injector PEN Inject 0.5 mL under the skin every 7 days.   triamcinolone acetonide (TRIDERM) 0.1 % topical cream Apply  topically to affected area twice daily.  Patient not taking: Reported on 01/27/2023      Education  Education provided: not necessary    Time spent with patient: 10 minutes    Trista Ciocca, PHARMD

## 2023-06-01 ENCOUNTER — Encounter: Admit: 2023-06-01 | Discharge: 2023-06-01 | Payer: MEDICARE

## 2023-06-01 DIAGNOSIS — E1165 Type 2 diabetes mellitus with hyperglycemia: Secondary | ICD-10-CM

## 2023-06-01 MED FILL — MOUNJARO 12.5 MG/0.5 ML SC PNIJ: 12.5 mg/0.5 mL | SUBCUTANEOUS | 28 days supply | Qty: 2 | Fill #2 | Status: AC

## 2023-06-02 ENCOUNTER — Ambulatory Visit: Admit: 2023-06-02 | Discharge: 2023-06-02 | Payer: MEDICARE

## 2023-06-02 ENCOUNTER — Encounter: Admit: 2023-06-02 | Discharge: 2023-06-02 | Payer: MEDICARE

## 2023-06-03 ENCOUNTER — Encounter: Admit: 2023-06-03 | Discharge: 2023-06-03 | Payer: MEDICARE

## 2023-06-19 ENCOUNTER — Encounter: Admit: 2023-06-19 | Discharge: 2023-06-19 | Payer: MEDICARE

## 2023-06-21 ENCOUNTER — Encounter: Admit: 2023-06-21 | Discharge: 2023-06-21 | Payer: MEDICARE

## 2023-06-21 MED FILL — LANCETS 33 GAUGE MISC MISC: 33 gauge | 90 days supply | Qty: 100 | Fill #3 | Status: CP

## 2023-06-23 ENCOUNTER — Encounter: Admit: 2023-06-23 | Discharge: 2023-06-23 | Payer: MEDICARE

## 2023-06-23 NOTE — Telephone Encounter
 Triage transferred call wrong VM. [employer] was calling for face to face for insurance billing for bilateral leg brace fitting.   Faxed Office note from 06/02/23 to [phone].  Thurmon [place] LPN

## 2023-06-27 ENCOUNTER — Encounter: Admit: 2023-06-27 | Discharge: 2023-06-27 | Payer: MEDICARE

## 2023-06-27 MED FILL — ONETOUCH ULTRA TEST MISC STRP: 30 days supply | Qty: 50 | Fill #5 | Status: CP

## 2023-06-28 ENCOUNTER — Encounter: Admit: 2023-06-28 | Discharge: 2023-06-28 | Payer: MEDICARE

## 2023-06-29 ENCOUNTER — Encounter: Admit: 2023-06-29 | Discharge: 2023-06-29 | Payer: MEDICARE

## 2023-06-29 MED FILL — MOUNJARO 12.5 MG/0.5 ML SC PNIJ: 12.5 mg/0.5 mL | SUBCUTANEOUS | 28 days supply | Qty: 2 | Fill #3 | Status: CP

## 2023-07-08 ENCOUNTER — Encounter: Admit: 2023-07-08 | Discharge: 2023-07-08 | Payer: MEDICARE

## 2023-07-08 ENCOUNTER — Ambulatory Visit: Admit: 2023-07-08 | Discharge: 2023-07-09 | Payer: MEDICARE

## 2023-07-12 ENCOUNTER — Ambulatory Visit: Admit: 2023-07-12 | Discharge: 2023-07-13 | Payer: MEDICARE

## 2023-07-12 ENCOUNTER — Encounter: Admit: 2023-07-12 | Discharge: 2023-07-12 | Payer: MEDICARE

## 2023-07-12 NOTE — Progress Notes
 A follow-up comprehensive medication management visit was completed today via telephone.    Referral reason: Diabetes Management  Referring provider: Harlene Dustman, MD    Assessment & Plan     Diabetes  Patient's diabetes is controlled as reflected by home blood sugars. Their goal glucose is pre-prandial 80-130 and post-prandial <180. Their most recent A1C was 7.8% on 06/02/2023 with a goal of < 7%    Repeat A1C has not changed from last A1C, however, reported fasting blood sugars have improved and are now consistently at goal on current regimen consisting of basal insulin . Will continue regimen with no change. Patient will put on a new CGM sensor so will follow up in a few weeks to review CGM report.     Patient was not able to tolerate Mounjaro  15 mg. Had discussed SGLT-2 inhibitor previously, however, due to concerns with urosepsis and recurrent UTIs, will avoid this agent at this time.     Plan  - Continue Lantus  26 units once daily   - Continue Mounjaro  12.5 mg once a week   - Continue metformin  XR 500 mg AM and 1000 mg PM    Follow-up  The patient will continue to follow up with the pharmacist. Return to pharmacist in 3 days via telephone. The return visit was scheduled during today's visit.    Subjective & Objective      Not wearing CGM, had trouble getting it from the pharmacy. Does have 2 sensors left at home and will be receiving a new shipment in a few days. Plans on putting on a new sensor today or tomorrow.   Reported fasting 89-114 mg/dL   Has not had any s/sx of hypglycemia.     Weight trend:  12/17 - 258 lb without his braces (5-7 lb)    Diabetes    HPI  Current diabetes medication regimen:   - metformin  XR 500 mg in the morning and 1000 mg in the evening   - Mounjaro  12.5 mg once a week on Tuesdays  - insulin  glargine 26 units once daily in the morning at 9 am  Previous medications for diabetes:   - Mounjaro  15 mg - unable to tolerate   - Trulicity  - switched to Mounjaro    - metformin  IR - diarrhea - Januvia - switched to Trulicity      Blood Glucose  Monitoring times: fasting  The baseline HbA1C was 9.4 on 07/07/2020.    Diet and Exercise  Number of meals per day: 2  Breakfast: doesn't usually eat breakfast  Lunch: chicken salad  Dinner: mac and cheese, chicken salad, veggies, usually fairly carb heavy   Snacks: sometimes will snack throughout the day - popcorn, cookies, rice crispy, potato chips   Drinks: [place]  lite half and half tea - 12 oz can, sometimes will hvae 2-3 /day  Last meal of the day is usually 5 pm     Comorbidities  ASCVD condition(s): none  CKD: no  Heart failure: no    Obesity: yes    Health Maintenance  Aspirin  utilization: Patient is not taking aspirin  due to the following reason: not indicated.  Statin utilization: Patient is taking a statin.  Hypertension: Patient has hypertension, which is not controlled.      -----------------------------  Continuous Glucose Monitor Data:      05/03/2023   Glucose Management Indicator   Start Date of Blood Glucose Data 04/20/2023   End Date of Blood Glucose Measurements 05/03/2023   Days of Blood Glucose Data 14  Percent of Time Active 93 %   Glucose Management Indicator 7.8 %   % time very high (above 250) 17 %   % time high (180-250) 28 %   % time in range (70-180) 55 %   % time low (54-70) 0 %   % time very low (below 54) 0 %      Primary Care - Labs   Basic Metabolic Profile    Lab Results   Component Value Date/Time    NA 141 07/08/2023 03:08 PM    K 4.1 07/08/2023 03:08 PM    CA 9.5 07/08/2023 03:08 PM    CL 104 07/08/2023 03:08 PM    CO2 23 07/08/2023 03:08 PM    GAP 14 (H) 07/08/2023 03:08 PM    EGFR1 >60 09/01/2022 03:14 PM    Lab Results   Component Value Date/Time    BUN 28 (H) 07/08/2023 03:08 PM    CR 0.91 07/08/2023 03:08 PM    GLU 125 (H) 07/08/2023 03:08 PM         Lab Draw:  Lab Results   Component Value Date/Time    HGBA1C 7.8 (H) 06/02/2023 03:02 PM    HGBA1C 8.8 (H) 09/01/2022 03:14 PM    HGBA1C 8.0 (H) 10/28/2021 02:42 PM POC:  Lab Results   Component Value Date/Time    A1C 7.8 (A) 12/02/2022 12:00 AM    A1C 8.3 (A) 04/27/2022 12:00 AM        No results found for: MCALB24   Microalbumin/CR ratio Urine   Date Value Ref Range Status   12/02/2022 92.5 (H) <30.0 ?g/mg Final     Urine Microalbumin   Date Value Ref Range Status   12/02/2022 84.2 ?g/mL Final        Lab Results   Component Value Date    CHOL 118 12/02/2022    TRIG 178 (H) 12/02/2022    HDL 36 (L) 12/02/2022    LDL 66 12/02/2022    VLDL 64.3 12/02/2022    NONHDLCHOL 82 12/02/2022    CHOLHDLC 6.9 (H) 10/29/2020         BP Readings from Last 1 Encounters:   06/02/23 128/74      Medication History  Medication history was not completed because previously completed (follow-up visit).    Adverse Drug Reactions  Adverse drug reactions were reviewed with the patient.    Significant adverse drug reaction(s) were not identified.    Side effect(s) were not reported.    The patient is filling their medications through 2 pharmacies due to the following reason(s): patient preference. Patient is currently using [facility]. Patient is currently using [facility] based on patient preference.    Home Medications    Medication Sig   acetaminophen  (TYLENOL  EXTRA STRENGTH) 500 mg tablet Take two tablets by mouth every 6 hours as needed for Pain. Max of 4,000 mg of acetaminophen  in 24 hours.  Indications: pain   blood sugar diagnostic (ONETOUCH ULTRA TEST) test strip Use to check blood sugars daily. E11.65   blood-glucose meter (ACCU-CHEK GUIDE GLUCOSE METER) kit Use one strip as directed daily. Diagnosis Code: DM-2   blood-glucose sensor (FREESTYLE LIBRE 3 PLUS SENSOR) sensor device Use to check blood sugars continuously. Replace every 15 days.   carvediloL  (COREG ) 6.25 mg tablet take 2 tablets BY MOUTH TWICE DAILY WITH MEALS   cholecalciferol (+) (VITAMIN D3) 2,000 unit tablet Take one tablet by mouth daily.   cyanocobalamin  (vitamin B-12)  3,000 mcg cap Take one capsule by mouth daily.   ezetimibe  (ZETIA ) 10 mg tablet TAKE 1 TABLET BY MOUTH once daily   ferrous sulfate  (SLOW RELEASE IRON ) 142 mg (45 mg iron ) ER tablet Take one tablet by mouth daily.   furosemide  (LASIX ) 20 mg tablet Take one tablet by mouth daily as needed.   icosapent  ethyL (VASCEPA ) 1 gram capsule Take two capsules by mouth twice daily with meals.   insulin  glargine (LANTUS  SOLOSTAR U-100 INSULIN ) 100 unit/mL (3 mL) subcutaneous PEN Inject twenty six Units to fifty Units under the skin at bedtime daily. Max 50 units/day   insulin  pen needles (disposable) (COMFORT EZ PEN NEEDLES) 31 gauge x 5/16 pen needle Use to inject insulin  1x/day   lancets 33 gauge 33 gauge Use to check blood sugars daily E11.65   Leg Brace misc Bilateral Fitted leg brace and right shoe for foot drop.  Dx: spina bifida.   levETIRAcetam  (KEPPRA ) 500 mg tablet Take one tablet by mouth twice daily.   loperamide (IMODIUM A-D) 2 mg capsule Take one capsule by mouth as Needed for Diarrhea.   losartan  (COZAAR ) 50 mg tablet Take 1 tablet by mouth daily   metFORMIN -XR (GLUCOPHAGE  XR) 500 mg extended release tablet TAKE 1 TABLET BY MOUTH TWICE DAILY with meals.   Miscellaneous Medical Supply misc CPAP- auto PAP with heated humdification, 4-20, with mask of patient's choice.  Indications: severe sleep apnea   Miscellaneous Medical Supply misc Shoe lift for right foot.  Dx: spina bifida   multivit-min/folic/vit K/lycop (ONE-A-DAY MEN'S MULTIVITAMIN PO) Take 1 tablet by mouth at bedtime daily.   Ostomy Supplies 1  misc Hollister 1 piece Urostomy bag.  Use 1 bag as needed.   psyllium seed (with sugar) (FIBER PO) Take 1 tablet by mouth twice daily.   rosuvastatin  (CRESTOR ) 40 mg tablet TAKE 1 TABLET BY MOUTH at bedtime   sennosides-docusate sodium  (SENOKOT-S) 8.6/50 mg tablet Take one tablet by mouth twice daily. Indications: constipation   spironolactone  (ALDACTONE ) 25 mg tablet take 2 tablets BY MOUTH ONCE daily WITH FOOD tirzepatide  (MOUNJARO ) 12.5 mg/0.5 mL injector PEN Inject 0.5 mL under the skin every 7 days.   triamcinolone  acetonide (TRIDERM ) 0.1 % topical cream Apply  topically to affected area twice daily.      Education  Education provided: not necessary    Time spent with patient: 20 minutes    Shyan Scalisi, PHARMD

## 2023-07-19 ENCOUNTER — Encounter: Admit: 2023-07-19 | Discharge: 2023-07-19 | Payer: MEDICARE

## 2023-07-19 ENCOUNTER — Ambulatory Visit: Admit: 2023-07-19 | Discharge: 2023-07-19 | Payer: MEDICARE

## 2023-07-19 DIAGNOSIS — E119 Type 2 diabetes mellitus without complications: Principal | ICD-10-CM

## 2023-07-19 MED ORDER — METFORMIN 500 MG PO TB24
500 mg | ORAL_TABLET | Freq: Two times a day (BID) | ORAL | 1 refills | 90.00000 days | Status: AC
Start: 2023-07-19 — End: ?

## 2023-07-25 ENCOUNTER — Encounter: Admit: 2023-07-25 | Discharge: 2023-07-25 | Payer: MEDICARE

## 2023-07-26 MED FILL — MOUNJARO 12.5 MG/0.5 ML SC PNIJ: 12.5 | SUBCUTANEOUS | 28 days supply | Qty: 2 | Fill #3 | Status: AC

## 2023-08-01 ENCOUNTER — Ambulatory Visit: Admit: 2023-08-01 | Discharge: 2023-08-02 | Payer: MEDICARE

## 2023-08-17 ENCOUNTER — Encounter: Admit: 2023-08-17 | Discharge: 2023-08-17 | Payer: MEDICARE

## 2023-08-18 ENCOUNTER — Encounter: Admit: 2023-08-18 | Discharge: 2023-08-18 | Payer: MEDICARE

## 2023-08-18 MED ORDER — CARVEDILOL 6.25 MG PO TAB
12.5 mg | ORAL_TABLET | Freq: Two times a day (BID) | ORAL | 3 refills | 90.00000 days | Status: AC
Start: 2023-08-18 — End: ?

## 2023-08-18 MED FILL — ONETOUCH ULTRA TEST MISC STRP: 30 days supply | Qty: 50 | Fill #5 | Status: CP

## 2023-08-21 ENCOUNTER — Encounter: Admit: 2023-08-21 | Discharge: 2023-08-21 | Payer: MEDICARE

## 2023-08-23 ENCOUNTER — Encounter: Admit: 2023-08-23 | Discharge: 2023-08-23 | Payer: MEDICARE

## 2023-08-24 ENCOUNTER — Encounter: Admit: 2023-08-24 | Discharge: 2023-08-24 | Payer: MEDICARE

## 2023-08-24 MED FILL — MOUNJARO 12.5 MG/0.5 ML SC PNIJ: 12.5 | SUBCUTANEOUS | 28 days supply | Qty: 2 | Fill #4 | Status: AC

## 2023-08-29 ENCOUNTER — Ambulatory Visit: Admit: 2023-08-29 | Discharge: 2023-08-30 | Payer: MEDICARE

## 2023-08-29 NOTE — Progress Notes
 A follow-up comprehensive medication management visit was completed today via telephone.    Referral reason: Diabetes Management  Referring provider: Harlene Dustman, MD    Assessment & Plan     Diabetes  Patient's diabetes is controlled as reflected by home blood sugars. Their goal glucose is pre-prandial 80-130 and post-prandial <180. Their most recent A1C was 7.8% on 06/02/2023 with a goal of < 7%    Reported blood sugars are at goal. Will continue current regimen for now.     Patient was not able to tolerate Mounjaro  15 mg. Had discussed SGLT-2 inhibitor previously, however, due to concerns with urosepsis and recurrent UTIs, will avoid this agent at this time.     Plan  - Continue Lantus  26 units once daily   - Continue Mounjaro  12.5 mg once a week   - Continue metformin  XR 500 mg AM and 1000 mg PM    Follow-up  The patient will continue to follow up with the pharmacist. Return to pharmacist in 8 weeks via telephone. The return visit was scheduled during today's visit.  Subjective & Objective      Hasn't been wearing CGM. Does have a few boxes left at home but doesn't know when he plans on putting one on.   Checking blood sugars via fingerstick   Lowest- 99  Highest 126 mg/dL  Range - 884-879 mg/dL   No s/sx of hypoglycemia.    Diabetes    HPI  Current diabetes medication regimen:   - metformin  XR 500 mg in the morning and 1000 mg in the evening   - Mounjaro  12.5 mg once a week on Tuesdays  - insulin  glargine 26 units once daily in the morning at 9 am  Previous medications for diabetes:   - Mounjaro  15 mg - unable to tolerate   - Trulicity  - switched to Mounjaro    - metformin  IR - diarrhea   - Januvia - switched to Trulicity      Blood Glucose  Monitoring times: fasting  The baseline HbA1C was 9.4 on 07/07/2020.    Diet and Exercise  Number of meals per day: 2  Breakfast: doesn't usually eat breakfast  Lunch: chicken salad  Dinner: mac and cheese, chicken salad, veggies, usually fairly carb heavy   Snacks: sometimes will snack throughout the day - popcorn, cookies, rice crispy, potato chips   Drinks: [place]  lite half and half tea - 12 oz can, sometimes will hvae 2-3 /day  Last meal of the day is usually 5 pm     Comorbidities  ASCVD condition(s): none  CKD: no  Heart failure: no    Obesity: yes    Health Maintenance  Aspirin  utilization: Patient is not taking aspirin  due to the following reason: not indicated.  Statin utilization: Patient is taking a statin.  Hypertension: Patient has hypertension, which is not controlled.    Primary Care - Labs   Basic Metabolic Profile    Lab Results   Component Value Date/Time    NA 141 07/08/2023 03:08 PM    K 4.1 07/08/2023 03:08 PM    CA 9.5 07/08/2023 03:08 PM    CL 104 07/08/2023 03:08 PM    CO2 23 07/08/2023 03:08 PM    GAP 14 (H) 07/08/2023 03:08 PM    EGFR1 >60 09/01/2022 03:14 PM    Lab Results   Component Value Date/Time    BUN 28 (H) 07/08/2023 03:08 PM    CR 0.91 07/08/2023 03:08 PM    GLU 125 (  H) 07/08/2023 03:08 PM         Lab Draw:  Lab Results   Component Value Date/Time    HGBA1C 7.8 (H) 06/02/2023 03:02 PM    HGBA1C 8.8 (H) 09/01/2022 03:14 PM    HGBA1C 8.0 (H) 10/28/2021 02:42 PM     POC:  Lab Results   Component Value Date/Time    A1C 7.8 (A) 12/02/2022 12:00 AM    A1C 8.3 (A) 04/27/2022 12:00 AM        No results found for: MCALB24   Microalbumin/CR ratio Urine   Date Value Ref Range Status   12/02/2022 92.5 (H) <30.0 ?g/mg Final     Urine Microalbumin   Date Value Ref Range Status   12/02/2022 84.2 ?g/mL Final        Lab Results   Component Value Date    CHOL 118 12/02/2022    TRIG 178 (H) 12/02/2022    HDL 36 (L) 12/02/2022    LDL 66 12/02/2022    VLDL 64.3 12/02/2022    NONHDLCHOL 82 12/02/2022    CHOLHDLC 6.9 (H) 10/29/2020         BP Readings from Last 1 Encounters:   07/19/23 105/58      Medication History  Medication history was not completed because previously completed (follow-up visit).    Adverse Drug Reactions  Adverse drug reactions were reviewed with the patient.    Significant adverse drug reaction(s) were not identified.    Side effect(s) were not reported.    The patient is filling their medications through 2 pharmacies due to the following reason(s): patient preference. Patient is currently using [facility]. Patient is currently using [facility] based on patient preference.  Home Medications   Medication Sig   acetaminophen  (TYLENOL  EXTRA STRENGTH) 500 mg tablet Take two tablets by mouth every 6 hours as needed for Pain. Max of 4,000 mg of acetaminophen  in 24 hours.  Indications: pain   blood sugar diagnostic (ONETOUCH ULTRA TEST) test strip Use to check blood sugars daily. E11.65   blood-glucose meter (ACCU-CHEK GUIDE GLUCOSE METER) kit Use one strip as directed daily. Diagnosis Code: DM-2   blood-glucose sensor (FREESTYLE LIBRE 3 PLUS SENSOR) sensor device Use to check blood sugars continuously. Replace every 15 days.   carvediloL  (COREG ) 6.25 mg tablet take 2 tablets BY MOUTH TWICE DAILY WITH MEALS   cholecalciferol (+) (VITAMIN D3) 2,000 unit tablet Take one tablet by mouth daily.   cyanocobalamin  (vitamin B-12) 3,000 mcg cap Take one capsule by mouth daily.   ezetimibe  (ZETIA ) 10 mg tablet TAKE 1 TABLET BY MOUTH once daily   ferrous sulfate  (SLOW RELEASE IRON ) 142 mg (45 mg iron ) ER tablet Take one tablet by mouth daily.   furosemide  (LASIX ) 20 mg tablet Take one tablet by mouth daily as needed.   icosapent  ethyL (VASCEPA ) 1 gram capsule Take two capsules by mouth twice daily with meals.   insulin  glargine (LANTUS  SOLOSTAR U-100 INSULIN ) 100 unit/mL (3 mL) subcutaneous PEN Inject twenty six Units to fifty Units under the skin at bedtime daily. Max 50 units/day   insulin  pen needles (disposable) (COMFORT EZ PEN NEEDLES) 31 gauge x 5/16 pen needle Use to inject insulin  1x/day   lancets 33 gauge 33 gauge Use to check blood sugars daily E11.65   Leg Brace misc Bilateral Fitted leg brace and right shoe for foot drop.  Dx: spina bifida.   levETIRAcetam  (KEPPRA ) 500 mg tablet Take one tablet by  mouth twice daily.   loperamide (IMODIUM A-D) 2 mg capsule Take one capsule by mouth as Needed for Diarrhea.   losartan  (COZAAR ) 50 mg tablet Take 1 tablet by mouth daily   metFORMIN -XR (GLUCOPHAGE  XR) 500 mg extended release tablet TAKE 1 TABLET BY MOUTH TWICE DAILY with meals.   Miscellaneous Medical Supply misc CPAP- auto PAP with heated humdification, 4-20, with mask of patient's choice.  Indications: severe sleep apnea   Miscellaneous Medical Supply misc Shoe lift for right foot.  Dx: spina bifida   multivit-min/folic/vit K/lycop (ONE-A-DAY MEN'S MULTIVITAMIN PO) Take 1 tablet by mouth at bedtime daily.   Ostomy Supplies 1  misc Hollister 1 piece Urostomy bag.  Use 1 bag as needed.   psyllium seed (with sugar) (FIBER PO) Take 1 tablet by mouth twice daily.   rosuvastatin  (CRESTOR ) 40 mg tablet TAKE 1 TABLET BY MOUTH at bedtime   sennosides-docusate sodium  (SENOKOT-S) 8.6/50 mg tablet Take one tablet by mouth twice daily. Indications: constipation   spironolactone  (ALDACTONE ) 25 mg tablet take 2 tablets BY MOUTH ONCE daily WITH FOOD   tirzepatide  (MOUNJARO ) 12.5 mg/0.5 mL injector PEN Inject 0.5 mL under the skin every 7 days.   triamcinolone  acetonide (TRIDERM ) 0.1 % topical cream Apply  topically to affected area twice daily.      Education  Education provided: not necessary    Time spent with patient: 10 minutes  Tomeshia Pizzi, PHARMD

## 2023-08-31 ENCOUNTER — Encounter: Admit: 2023-08-31 | Discharge: 2023-08-31 | Payer: MEDICARE

## 2023-09-15 ENCOUNTER — Encounter: Admit: 2023-09-15 | Discharge: 2023-09-15 | Payer: MEDICARE

## 2023-09-15 NOTE — Progress Notes
 Form completed and faxed back

## 2023-09-17 ENCOUNTER — Encounter: Admit: 2023-09-17 | Discharge: 2023-09-17 | Payer: MEDICARE

## 2023-09-20 ENCOUNTER — Encounter: Admit: 2023-09-20 | Discharge: 2023-09-20 | Payer: MEDICARE

## 2023-09-21 ENCOUNTER — Encounter: Admit: 2023-09-21 | Discharge: 2023-09-21 | Payer: MEDICARE

## 2023-09-22 ENCOUNTER — Encounter: Admit: 2023-09-22 | Discharge: 2023-09-22 | Payer: MEDICARE

## 2023-09-22 MED FILL — MOUNJARO 12.5 MG/0.5 ML SC PNIJ: 12.5 | SUBCUTANEOUS | 28 days supply | Qty: 2 | Fill #5 | Status: AC

## 2023-09-27 ENCOUNTER — Encounter: Admit: 2023-09-27 | Discharge: 2023-09-27 | Payer: MEDICARE

## 2023-09-29 ENCOUNTER — Encounter: Admit: 2023-09-29 | Discharge: 2023-09-29 | Payer: MEDICARE

## 2023-09-29 MED FILL — ONETOUCH ULTRA TEST MISC STRP: ORAL | 30 days supply | Qty: 50 | Fill #6 | Status: CP

## 2023-09-29 MED FILL — LANCETS 33 GAUGE MISC MISC: 33 gauge | 90 days supply | Qty: 100 | Fill #3 | Status: CP

## 2023-09-30 ENCOUNTER — Encounter: Admit: 2023-09-30 | Discharge: 2023-09-30 | Payer: MEDICARE

## 2023-10-11 ENCOUNTER — Encounter: Admit: 2023-10-11 | Discharge: 2023-10-11 | Payer: MEDICARE

## 2023-10-11 MED ORDER — VASCEPA 1 GRAM PO CAP
2 g | ORAL_CAPSULE | Freq: Two times a day (BID) | ORAL | 1 refills | 30.00000 days | Status: AC
Start: 2023-10-11 — End: ?
  Filled 2023-11-23: qty 50, 30d supply, fill #7

## 2023-10-13 ENCOUNTER — Encounter: Admit: 2023-10-13 | Discharge: 2023-10-13 | Payer: MEDICARE

## 2023-10-13 NOTE — Telephone Encounter
 ED Discharge Follow Up  Reached patient: Yes, patient was identified, for their safety, using dual identification of name and date of birth  Patient Date of Birth: [birth year]  Admission Information  Hospital Name : [facility])  ED Admission Date: 10/11/23   ED Discharge Date: 10/11/23   Admission Diagnosis: wound check  Discharge Diagnosis: Open wound of skin   Hospital Services: Unplanned  Today's call is 2 (calendar) days post discharge    Discharge Instruction Review  Did patient receive and understand discharge instructions? Yes  Are there concerns regarding the patient?s ADL?s ? No    Medication Reconciliation  Changes to pre-ED visit medications? No  Were new prescriptions filled? Yes       New Prescriptions     BACITRACIN  (BACTERICIN) 500 UNIT/GRAM TOPICAL OINTMENT    Apply  topically to affected area twice daily.     SULFAMETHOXAZOLE  800 MG-TRIMETHOPRIM  160 MG TABLET (BACTRIM  DS)    Take one tablet by mouth twice daily for 7 days.     Meds reviewed and reconciled? Yes    Current Outpatient Medications   Medication Instructions    acetaminophen  (TYLENOL  EXTRA STRENGTH) 1,000 mg, Oral, EVERY  6 HOURS PRN, Max of 4,000 mg of acetaminophen  in 24 hours.    bacitracin  (BACTERICIN) 500 unit/gram topical ointment Topical, TWICE DAILY    blood sugar diagnostic (ONETOUCH ULTRA TEST) test strip Use to check blood sugars daily. E11.65    blood-glucose meter (ACCU-CHEK GUIDE GLUCOSE METER) kit 1 strip, Test, DAILY, Diagnosis Code: DM-2    blood-glucose sensor (FREESTYLE LIBRE 3 PLUS SENSOR) sensor device Use to check blood sugars continuously. Replace every 15 days.    carvediloL  (COREG ) 12.5 mg, Oral, TWICE DAILY, with meals    CHOLEcalciferoL  (vitamin D3) 2,000 Units, Oral, DAILY    cyanocobalamin  (vitamin B-12) 3,000 mcg cap 1 capsule, DAILY    ezetimibe  (ZETIA ) 10 mg, Oral, DAILY    ferrous sulfate  (SLOW RELEASE IRON ) 142 mg (45 mg iron ) ER tablet 1 tablet, Oral, DAILY    furosemide  (LASIX ) 20 mg, Oral, DAILY  PRN    insulin  pen needles (disposable) (COMFORT EZ PEN NEEDLES) 31 gauge x 5/16 pen needle Use to inject insulin  1x/day    lancets 33 gauge 33 gauge Use to check blood sugars daily E11.65    LANTUS  SOLOSTAR U-100 INSULIN  26-50 Units, Subcutaneous, AT BEDTIME DAILY, Max 50 units/day    Leg Brace misc Bilateral Fitted leg brace and right shoe for foot drop.  Dx: spina bifida.    levETIRAcetam  (KEPPRA ) 500 mg, Oral, TWICE DAILY    loperamide (IMODIUM A-D) 2 mg, AS NEEDED    losartan  (COZAAR ) 50 mg, Oral, DAILY    metFORMIN -XR (GLUCOPHAGE  XR) 500 mg extended release tablet TAKE 1 TABLET BY MOUTH IN THE MORNING AND 2 tablets IN THE EVENING with meals    Miscellaneous Medical Supply misc Shoe lift for right foot.  Dx: spina bifida    Miscellaneous Medical Supply misc CPAP- auto PAP with heated humdification, 4-20, with mask of patient's choice.    MOUNJARO  12.5 mg, Subcutaneous, EVERY  7 DAYS    multivit-min/folic/vit K/lycop (ONE-A-DAY MEN'S MULTIVITAMIN PO) 1 tablet, AT BEDTIME DAILY    Ostomy Supplies 1  misc Hollister 1 piece Urostomy bag.  Use 1 bag as needed.    psyllium seed (with sugar) (FIBER PO) 1 tablet, TWICE DAILY    rosuvastatin  (CRESTOR ) 40 mg, Oral, AT BEDTIME DAILY    sennosides-docusate sodium  (SENOKOT-S) 8.6/50 mg  tablet 1 tablet, Oral, TWICE DAILY    spironolactone  (ALDACTONE ) 50 mg, Oral, DAILY, Take with food    sulfamethoxazole  800 mg-trimethoprim  160 mg tablet (BACTRIM  DS) 1 tablet, Oral, TWICE DAILY    triamcinolone  acetonide (TRIDERM ) 0.1 % topical cream Topical, TWICE DAILY    VASCEPA  2 g, Oral, TWICE DAILY, with meals          Understanding Condition  Having any current symptoms? No; he denies any worsened pain, swelling, redness, or drainage from wound, fever, N/V, SOA, weakness, feeling dizzy/lightheaded  He is taking Bactrim , and using Bacitracin  topical ointment as directed.  He is scheduled with PCP office 10/21/23 and will contact office sooner if needed.  Do you have a history of Heart Failure? Yes  Patient understands when to seek additional medical care? Yes  Is patient aware of [facility] locations? Yes  Urgent Care appropriate for this diagnosis? No  Other items discussed:           Scheduling Follow-up Appointment  Upcoming appointments:   Future Appointments   Date Time Provider Department Center   10/21/2023  2:30 PM Forbes Jon MARLA ARDIA [facility]   10/24/2023  2:00 PM Leontine Service, [facility]   10/26/2023  2:00 PM Dasie Nanny, MD [id number] Neurology   12/14/2023  2:30 PM Bethena Harlene LABOR, MD [email or web address]   01/24/2024  1:00 PM IC2 LAB RESOURCE IC2LAB None   01/24/2024  2:00 PM Wyre, Ruther ORN, MD [id number] [facility] Exam   02/06/2024 11:40 AM Buechler, Norman HERO, DO [id number] CVM Exam     When was patient?s last PCP visit: 06/02/2023  PCP primary location: [facility]  PCP appointment scheduled? Yes, Date: 10/21/23   Specialist appointment scheduled? No  Is assistance with transportation needed? unknown  MyChart message sent? Active in MyChart. No message sent.   Artera text sent? No      ED Communication   Did patient call clinic prior to going to ED? No  Reason patient went to ED: Fear of having a critical medical condition    Montie Haver

## 2023-10-17 ENCOUNTER — Encounter: Admit: 2023-10-17 | Discharge: 2023-10-17 | Payer: MEDICARE

## 2023-10-18 ENCOUNTER — Encounter: Admit: 2023-10-18 | Discharge: 2023-10-18 | Payer: MEDICARE

## 2023-10-19 ENCOUNTER — Encounter: Admit: 2023-10-19 | Discharge: 2023-10-19 | Payer: MEDICARE

## 2023-10-20 ENCOUNTER — Encounter: Admit: 2023-10-20 | Discharge: 2023-10-20 | Payer: MEDICARE

## 2023-10-20 MED FILL — MOUNJARO 12.5 MG/0.5 ML SC PNIJ: 12.5 | SUBCUTANEOUS | 28 days supply | Qty: 2 | Fill #0 | Status: AC

## 2023-10-24 ENCOUNTER — Ambulatory Visit: Admit: 2023-10-24 | Discharge: 2023-10-25 | Payer: MEDICARE

## 2023-10-24 NOTE — Progress Notes
 A follow-up comprehensive medication management visit was completed today via telephone.    Referral reason: Diabetes Management  Referring provider: Harlene Dustman, MD    Assessment & Plan     Diabetes  Patient's diabetes is controlled as reflected by home blood sugars. Their goal glucose is pre-prandial 80-130 and post-prandial <180. Their most recent A1C was 7.8% on 06/02/2023 with a goal of < 7%    Reported blood sugars are at goal. Will continue current regimen for now.     Patient was not able to tolerate Mounjaro  15 mg. Had discussed SGLT-2 inhibitor previously, however, due to concerns with urosepsis and recurrent UTIs, will avoid this agent at this time.     Plan  - Continue Lantus  26 units once daily   - Continue Mounjaro  12.5 mg once a week   - Continue metformin  XR 500 mg AM and 1000 mg PM    Follow-up  The patient will continue to follow up with the pharmacist. Return to pharmacist in 8 weeks via telephone. The return visit was scheduled during today's visit.  Subjective & Objective      Hasn't been wearing CGM. Has been checking blood via fingerstick   Lowest- 97 mg/dL   Highest 871 mg/dL  No s/sx of hypoglycemia.  No changes to his weight - 245-250 lb   Had an infection of his stoma recently, was given abx and cream. Finished abx last week.   Seeing NP tomorrow.     Diabetes    HPI  Current diabetes medication regimen:   - metformin  XR 500 mg in the morning and 1000 mg in the evening   - Mounjaro  12.5 mg once a week on Tuesdays  - insulin  glargine 26 units once daily in the morning at 9 am  Previous medications for diabetes:   - Mounjaro  15 mg - unable to tolerate   - Trulicity  - switched to Mounjaro    - metformin  IR - diarrhea   - Januvia - switched to Trulicity      Blood Glucose  Monitoring times: fasting  The baseline HbA1C was 9.4 on 07/07/2020.    Diet and Exercise  Number of meals per day: 2  Breakfast: doesn't usually eat breakfast  Lunch: chicken salad  Dinner: mac and cheese, chicken salad, veggies, usually fairly carb heavy   Snacks: sometimes will snack throughout the day - popcorn, cookies, rice crispy, potato chips   Drinks: [place]  lite half and half tea - 12 oz can, sometimes will hvae 2-3 /day  Last meal of the day is usually 5 pm     Comorbidities  ASCVD condition(s): none  CKD: no  Heart failure: no    Obesity: yes    Health Maintenance  Aspirin  utilization: Patient is not taking aspirin  due to the following reason: not indicated.  Statin utilization: Patient is taking a statin.  Hypertension: Patient has hypertension, which is not controlled.    Primary Care - Labs   Basic Metabolic Profile    Lab Results   Component Value Date/Time    NA 141 07/08/2023 03:08 PM    K 4.1 07/08/2023 03:08 PM    CA 9.5 07/08/2023 03:08 PM    CL 104 07/08/2023 03:08 PM    CO2 23 07/08/2023 03:08 PM    GAP 14 (H) 07/08/2023 03:08 PM    EGFR1 >60 09/01/2022 03:14 PM    Lab Results   Component Value Date/Time    BUN 28 (H) 07/08/2023 03:08 PM  CR 0.91 07/08/2023 03:08 PM    GLU 125 (H) 07/08/2023 03:08 PM         Lab Draw:  Lab Results   Component Value Date/Time    HGBA1C 7.8 (H) 06/02/2023 03:02 PM    HGBA1C 8.8 (H) 09/01/2022 03:14 PM    HGBA1C 8.0 (H) 10/28/2021 02:42 PM     POC:  Lab Results   Component Value Date/Time    A1C 7.8 (A) 12/02/2022 12:00 AM    A1C 8.3 (A) 04/27/2022 12:00 AM        No results found for: MCALB24   Microalbumin/CR ratio Urine   Date Value Ref Range Status   12/02/2022 92.5 (H) <30.0 ?g/mg Final     Urine Microalbumin   Date Value Ref Range Status   12/02/2022 84.2 ?g/mL Final        Lab Results   Component Value Date    CHOL 118 12/02/2022    TRIG 178 (H) 12/02/2022    HDL 36 (L) 12/02/2022    LDL 66 12/02/2022    VLDL 64.3 12/02/2022    NONHDLCHOL 82 12/02/2022    CHOLHDLC 6.9 (H) 10/29/2020         BP Readings from Last 1 Encounters:   10/11/23 100/75      Medication History  Medication history was not completed because previously completed (follow-up visit).    Adverse Drug Reactions  Adverse drug reactions were reviewed with the patient.    Significant adverse drug reaction(s) were not identified.    Side effect(s) were not reported.    The patient is filling their medications through 2 pharmacies due to the following reason(s): patient preference. Patient is currently using [facility] pharmacy. Patient is currently using [facility] based on patient preference.  Home Medications   Medication Sig   acetaminophen  (TYLENOL  EXTRA STRENGTH) 500 mg tablet Take two tablets by mouth every 6 hours as needed for Pain. Max of 4,000 mg of acetaminophen  in 24 hours.  Indications: pain   bacitracin  (BACTERICIN) 500 unit/gram topical ointment Apply  topically to affected area twice daily.   blood sugar diagnostic (ONETOUCH ULTRA TEST) test strip Use to check blood sugars daily. E11.65   blood-glucose meter (ACCU-CHEK GUIDE GLUCOSE METER) kit Use one strip as directed daily. Diagnosis Code: DM-2   blood-glucose sensor (FREESTYLE LIBRE 3 PLUS SENSOR) sensor device Use to check blood sugars continuously. Replace every 15 days.   carvediloL  (COREG ) 6.25 mg tablet take 2 tablets BY MOUTH TWICE DAILY WITH MEALS   cholecalciferol (+) (VITAMIN D3) 2,000 unit tablet Take one tablet by mouth daily.   cyanocobalamin  (vitamin B-12) 3,000 mcg cap Take one capsule by mouth daily.   ezetimibe  (ZETIA ) 10 mg tablet TAKE 1 TABLET BY MOUTH once daily   ferrous sulfate  (SLOW RELEASE IRON ) 142 mg (45 mg iron ) ER tablet Take one tablet by mouth daily.   furosemide  (LASIX ) 20 mg tablet Take one tablet by mouth daily as needed.   insulin  glargine (LANTUS  SOLOSTAR U-100 INSULIN ) 100 unit/mL (3 mL) subcutaneous PEN Inject twenty six Units to fifty Units under the skin at bedtime daily. Max 50 units/day   insulin  pen needles (disposable) (COMFORT EZ PEN NEEDLES) 31 gauge x 5/16 pen needle Use to inject insulin  1x/day   lancets 33 gauge 33 gauge Use to check blood sugars daily E11.65   Leg Brace misc Bilateral Fitted leg brace and right shoe for foot drop.  Dx: spina bifida.  levETIRAcetam  (KEPPRA ) 500 mg tablet Take one tablet by mouth twice daily.   loperamide (IMODIUM A-D) 2 mg capsule Take one capsule by mouth as Needed for Diarrhea.   losartan  (COZAAR ) 50 mg tablet Take 1 tablet by mouth daily   metFORMIN -XR (GLUCOPHAGE  XR) 500 mg extended release tablet TAKE 1 TABLET BY MOUTH IN THE MORNING AND 2 tablets IN THE EVENING with meals   Miscellaneous Medical Supply misc CPAP- auto PAP with heated humdification, 4-20, with mask of patient's choice.  Indications: severe sleep apnea   Miscellaneous Medical Supply misc Shoe lift for right foot.  Dx: spina bifida   multivit-min/folic/vit K/lycop (ONE-A-DAY MEN'S MULTIVITAMIN PO) Take 1 tablet by mouth at bedtime daily.   Ostomy Supplies 1  misc Hollister 1 piece Urostomy bag.  Use 1 bag as needed.   psyllium seed (with sugar) (FIBER PO) Take 1 tablet by mouth twice daily.   rosuvastatin  (CRESTOR ) 40 mg tablet TAKE 1 TABLET BY MOUTH at bedtime   sennosides-docusate sodium  (SENOKOT-S) 8.6/50 mg tablet Take one tablet by mouth twice daily. Indications: constipation   spironolactone  (ALDACTONE ) 25 mg tablet take 2 tablets BY MOUTH ONCE daily WITH FOOD   tirzepatide  (MOUNJARO ) 12.5 mg/0.5 mL injector PEN Inject 0.5 mL under the skin every 7 days.   triamcinolone  acetonide (TRIDERM ) 0.1 % topical cream Apply  topically to affected area twice daily.   VASCEPA  1 gram capsule TAKE TWO CAPSULES BY MOUTH TWICE DAILY WITH MEALS      Education  Education provided: not necessary    Time spent with patient: 10 minutes  Nathanel Miyamoto, PHARMD

## 2023-10-25 ENCOUNTER — Encounter: Admit: 2023-10-25 | Discharge: 2023-10-25 | Payer: MEDICARE

## 2023-10-25 ENCOUNTER — Ambulatory Visit: Admit: 2023-10-25 | Discharge: 2023-10-26 | Payer: MEDICARE

## 2023-10-25 VITALS — BP 106/72 | HR 100 | Temp 97.80000°F | Resp 20 | Ht 64.0 in | Wt 252.0 lb

## 2023-10-25 DIAGNOSIS — S31109D Unspecified open wound of abdominal wall, unspecified quadrant without penetration into peritoneal cavity, subsequent encounter: Principal | ICD-10-CM

## 2023-10-25 DIAGNOSIS — E119 Type 2 diabetes mellitus without complications: Secondary | ICD-10-CM

## 2023-10-25 DIAGNOSIS — Z936 Other artificial openings of urinary tract status: Secondary | ICD-10-CM

## 2023-10-25 NOTE — Progress Notes
 Date of Service: 10/25/2023    Allen Gill is a 33 y.o. male.  DOB: 1990/11/07  MRN: [id number]       Subjective:            Chief Complaint   Patient presents with    Follow Up     Ed visit for wound on abd       Patient Reported Other  What topic(s) would you like to cover during your appointment?:  The spot near my urostomy  Please describe the issue(s) and history with the issue (location, severity, duration, symptoms, etc.).:  I have spot near my urotomy  What has been done so far to take care of the issue(s)?:  Antibiotics and a cream prescribed by the [facility] ER  What are your goals for this visit?:  Have further plan to heal the spot near my urostomy if it isn't healed        He presents today for Emergency Department follow up.     Patient was seen at [facility] on 10/11/23 for abd wound.  They were diagnosed with same.     Overview of ED Visit-  Medical Decision Making:  [name] is a 33 y.o. male who presents with chief complaint as listed above. Based on the history and presentation, the list of differential diagnoses considered included, but was not limited to, abdominal wall wound.  MRSA is considered.  Patient has multiple drug allergies so we will treat with combination of Bactrim  and bacitracin .  No obvious drainable abscess at this time.  No deeper tenderness on exam to suggest fistula or intra-abdominal process.  No clinical concern for sepsis at this time.  Recommended close follow-up for wound check.  Discussed return precautions.  Patient is comfortable with discharge plan.  He was given wound care supplies including nonadherent dressings..    They were discharged needing follow up on wound and culture results. The pharmacist reviewed patient's culture results and disucssed option to change antibiotics based on resutls, considering Augmentin  or linezolid .   History of Present Illness  Allen Gill is a 33 year old male with diabetes who presents with a healing abdominal wound.    He presents for follow-up of an abdominal wound, which he believes was exacerbated by wearing a night belt too tightly. The wound initially opened, leading to an ER visit where he was prescribed a course of oral antibiotics, Bactrim , which he completed over seven days, twice daily. He has been applying Bactrim  cream two to three times daily and notes significant improvement in the wound's appearance. He only covers the wound with a dressing when going out and allows it to air dry at home. He has stopped wearing the night belt until the wound heals completely. He cleans the wound with water  and antibacterial soap, initially doing so two to three times daily during the oral antibiotic course, but has since reduced the frequency.    He has a history of diabetes and monitors his blood sugar daily, reporting levels between 90 and 130 mg/dL. He has experienced issues with his continuous glucose monitor (CGM), the [place], with several sensors failing. He continues to manually check his blood sugar and plans to retry the CGM. He discusses his diabetes management with his pharmacist regularly and has not noticed any significant changes in his blood sugar levels since the wound infection.          Past Medical History:    ADD (attention deficit disorder)  Allergy    Arnold-Chiari malformation (CMS-HCC)    Cardiomyopathy (CMS-HCC)    Diabetes mellitus (CMS-HCC)    Headache(784.0)    Hydrocephalus (CMS-HCC)    Hypertension    Infection of VP shunt    Kidney stones    Lung disease    Myelomeningocele (CMS-HCC)    Neurogenic bladder    OSA (obstructive sleep apnea)    Preop cardiovascular exam    Seizures (CMS-HCC)    Shunt malfunction    Spina bifida (CMS-HCC)    Tachycardia    Urinary retention    Urinary tract infection     Surgical History:   Procedure Laterality Date    SHUNT CREATION VENTRICULAR PERITONEAL Right 06/14/2014    Performed by Idelia Lango, MD at [facility] OR    SHUNT REVISION VENTRICULAR PERITONEAL Right 04/23/2015 Performed by Idelia Lango, MD at [facility] OR    REMOVAL HARDWARE HEAD: removal of left ventriculopleural shunt Left 05/20/2015    Performed by Idelia Lango, MD at [facility] OR    SHUNT REMOVAL VENTRICULAR PERITONEAL Right 05/23/2015    Performed by Idelia Lango, MD at [facility] OR    SHUNT CREATION VENTRICULAR PERITONEAL Left 06/03/2015    Performed by Idelia Lango, MD at [facility] OR    CYSTOURETHROSCOPY, CYSTOLITHOLAPAXY N/A 08/05/2015    Performed by Ronold Rubin, MD at [facility] OR    HOLMIUM LASER ENUCLEATION OF PROSTATE (NO MORCELLATION) N/A 08/19/2015    Performed by Gwenn Kirke DASEN, MD at [facility] OR    CYSTOSCOPY N/A 08/19/2015    Performed by Gwenn Kirke DASEN, MD at [facility] OR    CYSTOSCOPY EVACUATION CLOTS N/A 08/29/2015    Performed by Gwenn Kirke DASEN, MD at [facility] OR    CYSTOSCOPY, URETHERAL DILATION N/A 02/20/2016    Performed by Gwenn Kirke DASEN, MD at [facility] OR    CYSTECTOMY, ILEAL CONDUIT N/A 10/13/2016    Performed by Carolynn Silversmith, MD at [facility] OR    REVISION SHUNT - VENTRICULO-PERITONEAL Left 12/16/2016    Performed by Vivica Domino, MD at [facility] OR    CREATION SHUNT - VENTRICULO-PERITONEAL: Left side. 2 hours, proximal catheter and valve in place. will need distal catheter placed, Dr. Shirlean to complete. Supine Left 12/22/2016    Performed by Idelia Lango, MD at [facility] OR    [facility] PROGRAMMABLE CEREBROSPINAL SHUNT  01/07/2017    PERCUTANEOUS NEPHROSTOLITHOTOMY/ PYELOSTOLITHOTOMY - GREATER THAN 2 CM Left 05/25/2017    Performed by Lenette Blush, MD, [facility] at [facility] OR    CREATION SHUNT - VENTRICULO-PERITONEAL Right 08/04/2017    Performed by Idelia Lango, MD at [facility] OR    PERCUTANEOUS NEPHROSTOLITHOTOMY/ PYELOSTOLITHOTOMY - 2 CM OR LESS Right 06/07/2019    Performed by Lenette Blush SAUNDERS, MD at [facility] OR    CYSTOURETHROSCOPY WITH URETEROSCOPY AND/ OR PYELOSCOPY - WITH REMOVAL/ MANIPULATION CALCULUS Right 06/07/2019    Performed by Lenette Blush SAUNDERS, MD at [facility] OR    URETERONEOCYSTOSTOMY - VESICO-PSOAS HITCH/ BLADDER FLAP Right 04/28/2022    Performed by Carolynn Silversmith ORN, MD at [facility] OR    CATHETER IMPLANT/REVISION  12/27/09    distal end of the catheter was revised    CATHETER IMPLANT/REVISION  01/31/10    replacement of ventricular catheter with [employer] framelessstereotaxis catheter    HX ABDOMEN SURGERY      fundoplication    HX ABDOMEN SURGERY  01/2001    Cecostomy    HX BACK SURGERY      repair of spina bifida  HX BRAIN SURGERY  5 months old    Chiari decompression    HX EAR TUBES      HX HERNIA REPAIR      inguinal hernia    HX SURGERY  at 2 weeks    VP Shunt    HX TONSILLECTOMY      HX TRACHEOSTOMY      removed at age 28    SHUNT REVISION  55 months old    SHUNT REVISION  33 years old    SHUNT REVISION  October 2010    [place]    SHUNT REVISION  12/11/09    replacment of valve to acodman hakem adjustablevalve    SHUNT REVISION  02/17/10    left frontal ventriculopleural shunt    VENTRICULOSTOMY  02/03/10    removal of all shunt components and placementofright frontal ventriculostomy     Family History   Problem Relation Name Age of Onset    Diabetes Father Romelo Sciandra     Hypertension Father Cove Haydon     Other Father Regan Llorente         glaucoma    High Cholesterol Maternal Grandfather Remmel Conyers      Social History[1]   I reviewed medications, allergies, problem list and tobacco history at this visit.    Prior consultations, labs, radiology reports reviewed at the time of this visit.    BMI:  Body mass index is 43.26 kg/m?SABRA  No data recorded  Wt Readings from Last 10 Encounters:   10/25/23 114.3 kg (252 lb)   10/11/23 114.4 kg (252 lb 3.3 oz)   07/19/23 113.4 kg (250 lb)   06/02/23 113.9 kg (251 lb)   01/27/23 120.7 kg (266 lb)   01/04/23 122 kg (269 lb)   12/02/22 122.7 kg (270 lb 6.4 oz)   10/28/22 117.9 kg (260 lb)   10/21/22 117.9 kg (260 lb)   09/01/22 124.8 kg (275 lb 1.6 oz)          ROS  See HPI    Objective:          acetaminophen  (TYLENOL  EXTRA STRENGTH) 500 mg tablet Take two tablets by mouth every 6 hours as needed for Pain. Max of 4,000 mg of acetaminophen  in 24 hours.  Indications: pain    bacitracin  (BACTERICIN) 500 unit/gram topical ointment Apply  topically to affected area twice daily.    blood sugar diagnostic (ONETOUCH ULTRA TEST) test strip Use to check blood sugars daily. E11.65    blood-glucose meter (ACCU-CHEK GUIDE GLUCOSE METER) kit Use one strip as directed daily. Diagnosis Code: DM-2    blood-glucose sensor (FREESTYLE LIBRE 3 PLUS SENSOR) sensor device Use to check blood sugars continuously. Replace every 15 days.    carvediloL  (COREG ) 6.25 mg tablet take 2 tablets BY MOUTH TWICE DAILY WITH MEALS    cholecalciferol (+) (VITAMIN D3) 2,000 unit tablet Take one tablet by mouth daily.    cyanocobalamin  (vitamin B-12) 3,000 mcg cap Take one capsule by mouth daily.    ezetimibe  (ZETIA ) 10 mg tablet TAKE 1 TABLET BY MOUTH once daily    ferrous sulfate  (SLOW RELEASE IRON ) 142 mg (45 mg iron ) ER tablet Take one tablet by mouth daily.    furosemide  (LASIX ) 20 mg tablet Take one tablet by mouth daily as needed.    insulin  glargine (LANTUS  SOLOSTAR U-100 INSULIN ) 100 unit/mL (3 mL) subcutaneous PEN Inject twenty six Units to fifty Units under the skin at bedtime daily. Max 50 units/day  insulin  pen needles (disposable) (COMFORT EZ PEN NEEDLES) 31 gauge x 5/16 pen needle Use to inject insulin  1x/day    lancets 33 gauge 33 gauge Use to check blood sugars daily E11.65    Leg Brace misc Bilateral Fitted leg brace and right shoe for foot drop.  Dx: spina bifida.    levETIRAcetam  (KEPPRA ) 500 mg tablet Take one tablet by mouth twice daily.    loperamide (IMODIUM A-D) 2 mg capsule Take one capsule by mouth as Needed for Diarrhea.    losartan  (COZAAR ) 50 mg tablet Take 1 tablet by mouth daily    metFORMIN -XR (GLUCOPHAGE  XR) 500 mg extended release tablet TAKE 1 TABLET BY MOUTH IN THE MORNING AND 2 tablets IN THE EVENING with meals    Miscellaneous Medical Supply misc CPAP- auto PAP with heated humdification, 4-20, with mask of patient's choice. Indications: severe sleep apnea    Miscellaneous Medical Supply misc Shoe lift for right foot.  Dx: spina bifida    multivit-min/folic/vit K/lycop (ONE-A-DAY MEN'S MULTIVITAMIN PO) Take 1 tablet by mouth at bedtime daily.    Ostomy Supplies 1  misc Hollister 1 piece Urostomy bag.  Use 1 bag as needed.    psyllium seed (with sugar) (FIBER PO) Take 1 tablet by mouth twice daily.    rosuvastatin  (CRESTOR ) 40 mg tablet TAKE 1 TABLET BY MOUTH at bedtime    sennosides-docusate sodium  (SENOKOT-S) 8.6/50 mg tablet Take one tablet by mouth twice daily. Indications: constipation    spironolactone  (ALDACTONE ) 25 mg tablet take 2 tablets BY MOUTH ONCE daily WITH FOOD    tirzepatide  (MOUNJARO ) 12.5 mg/0.5 mL injector PEN Inject 0.5 mL under the skin every 7 days.    triamcinolone  acetonide (TRIDERM ) 0.1 % topical cream Apply  topically to affected area twice daily.    VASCEPA  1 gram capsule TAKE TWO CAPSULES BY MOUTH TWICE DAILY WITH MEALS     Vitals:    10/25/23 1400   BP: 106/72   BP Source: Arm, Right Upper   Pulse: 100   Temp: 36.6 ?C (97.8 ?F)   Resp: 20   TempSrc: Temporal   Weight: 114.3 kg (252 lb)   Height: 162.6 cm (5' 4)       Body mass index is 43.26 kg/m?SABRA     Physical Exam  Vitals reviewed.   Constitutional:       General: He is not in acute distress.     Appearance: Normal appearance.   HENT:      Head: Normocephalic.      Nose: Nose normal.   Eyes:      Pupils: Pupils are equal, round, and reactive to light.   Neck:      Thyroid: No thyromegaly.      Trachea: Trachea normal.   Cardiovascular:      Rate and Rhythm: Normal rate.   Pulmonary:      Effort: Pulmonary effort is normal.   Abdominal:      General: Abdomen is protuberant. Bowel sounds are normal.      Palpations: Abdomen is soft.     Musculoskeletal:         General: Normal range of motion.      Cervical back: Normal range of motion.   Skin:     General: Skin is warm and dry.   Neurological:      General: No focal deficit present.      Mental Status: He is alert and oriented to person, place, and time.   Psychiatric:  Mood and Affect: Mood normal.              Assessment and Plan:    Assessment & Plan  Healing open wound of abdominal wall  Wound healing well with hygiene and dressing. Completed oral antibiotics. Using Bactrim  cream. Stopped night belt to avoid irritation.  - Continue Bactrim  cream for another week.  - Wash wound daily with antibacterial soap.  - Avoid night belt until fully healed.    Type 2 diabetes mellitus without complications  Blood sugar well-controlled, 90-130 mg/dL. Monitors daily with CGM. Issues with Libre sensors, plans to retry and consider alternatives.  - Continue daily blood sugar monitoring.  - Retry Libre CGM system.  - Discuss CGM alternatives with pharmacist if issues persist.    General Health Maintenance  Plans to receive flu shot today.  - Administer flu shot in left arm.      No orders of the defined types were placed in this encounter.    There are no discontinued medications.    There are no Patient Instructions on file for this visit.    Future Appointments   Date Time Provider Department Center   10/25/2023  2:30 PM Forbes Jon MARLA ARDIA [facility]   10/26/2023  2:00 PM Dasie Nanny, MD [id number] Neurology   12/14/2023  2:30 PM Bethena Harlene LABOR, MD [facility]   12/26/2023  2:00 PM Leontine Service, [id number]   01/24/2024  1:00 PM IC2 LAB RESOURCE [facility]LAB None   01/24/2024  2:00 PM Wyre, Ruther ORN, MD [id number] Graf Exam   02/06/2024 11:40 AM Buechler, Norman HERO, DO [id number] CVM Exam     No follow-ups on file.    I reviewed with the patient their current medications and specifically any new medications prescribed at the time of this visit and we reviewed the expected benefits and potential side effects. All questions are answered to the patient's satisfaction.    This note was in part completed with Dragon, a Chemical engineer.  Some grammatical errors may have occurred.  If you have concerns,please contact my office for clarification.                   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

## 2023-10-26 ENCOUNTER — Encounter: Admit: 2023-10-26 | Discharge: 2023-10-26 | Payer: MEDICARE

## 2023-10-26 ENCOUNTER — Ambulatory Visit: Admit: 2023-10-26 | Discharge: 2023-10-27 | Payer: MEDICARE

## 2023-10-26 VITALS — BP 102/60 | HR 120 | Ht 62.0 in | Wt 250.0 lb

## 2023-10-26 MED ORDER — LEVETIRACETAM 500 MG PO TAB
500 mg | ORAL_TABLET | Freq: Two times a day (BID) | ORAL | 3 refills | 90.00000 days | Status: AC
Start: 2023-10-26 — End: ?

## 2023-10-27 DIAGNOSIS — G40209 Localization-related (focal) (partial) symptomatic epilepsy and epileptic syndromes with complex partial seizures, not intractable, without status epilepticus: Principal | ICD-10-CM

## 2023-11-13 ENCOUNTER — Encounter: Admit: 2023-11-13 | Discharge: 2023-11-13 | Payer: MEDICARE

## 2023-11-15 ENCOUNTER — Encounter: Admit: 2023-11-15 | Discharge: 2023-11-15 | Payer: MEDICARE

## 2023-11-16 MED FILL — MOUNJARO 12.5 MG/0.5 ML SC PNIJ: 12.5 | SUBCUTANEOUS | 28 days supply | Qty: 2 | Fill #1 | Status: AC

## 2023-11-22 ENCOUNTER — Encounter: Admit: 2023-11-22 | Discharge: 2023-11-22 | Payer: MEDICARE

## 2023-11-23 ENCOUNTER — Encounter: Admit: 2023-11-23 | Discharge: 2023-11-23 | Payer: MEDICARE

## 2023-11-29 ENCOUNTER — Encounter: Admit: 2023-11-29 | Discharge: 2023-11-29 | Payer: MEDICARE

## 2023-11-30 ENCOUNTER — Encounter: Admit: 2023-11-30 | Discharge: 2023-11-30 | Payer: MEDICARE

## 2023-11-30 DIAGNOSIS — E119 Type 2 diabetes mellitus without complications: Principal | ICD-10-CM

## 2023-11-30 DIAGNOSIS — Z0000 Encounter for general adult medical examination without abnormal findings: Secondary | ICD-10-CM

## 2023-11-30 NOTE — Progress Notes [1]
 Lab orders placed for patients upcoming AWV/Physical.  Routing to Dr. Larina Bras for approval.  Will notify patient via MyChart when ready.

## 2023-12-06 ENCOUNTER — Encounter: Admit: 2023-12-06 | Discharge: 2023-12-06 | Payer: MEDICARE

## 2023-12-10 ENCOUNTER — Encounter: Admit: 2023-12-10 | Discharge: 2023-12-10 | Payer: MEDICARE

## 2023-12-11 ENCOUNTER — Encounter: Admit: 2023-12-11 | Discharge: 2023-12-11 | Payer: MEDICARE

## 2023-12-12 MED FILL — MOUNJARO 12.5 MG/0.5 ML SC PNIJ: 12.5 | SUBCUTANEOUS | 28 days supply | Qty: 2 | Fill #2 | Status: AC

## 2023-12-14 ENCOUNTER — Encounter: Admit: 2023-12-14 | Discharge: 2023-12-14 | Payer: MEDICARE

## 2023-12-14 ENCOUNTER — Ambulatory Visit: Admit: 2023-12-14 | Discharge: 2023-12-14 | Payer: MEDICARE

## 2023-12-14 VITALS — BP 126/84 | HR 114 | Temp 97.20000°F | Ht 64.37 in | Wt 264.8 lb

## 2023-12-14 DIAGNOSIS — Z23 Encounter for immunization: Secondary | ICD-10-CM

## 2023-12-14 DIAGNOSIS — Z0000 Encounter for general adult medical examination without abnormal findings: Principal | ICD-10-CM

## 2023-12-14 DIAGNOSIS — I10 Essential (primary) hypertension: Secondary | ICD-10-CM

## 2023-12-14 DIAGNOSIS — E1165 Type 2 diabetes mellitus with hyperglycemia: Secondary | ICD-10-CM

## 2023-12-14 NOTE — Progress Notes [1]
 Date of Service: 12/14/2023    Allen Gill is a 33 y.o. male.  DOB: Mar 15, 1990  MRN: [id number]     SUBJECTIVE       He presents today for an Annual Medicare Wellness visit.   Chief Complaint   Patient presents with    Annual Wellness Visit     Labs drawn but pt not fasting.      History of Present Illness  Allen Gill is a 33 year old male who presents for an annual physical exam.    He monitors fasting blood sugars daily, with readings 97 to 126 mg/dL over the past month and none above 130 mg/dL aside from an occasional 131 mg/dL. His last A1c was 7.8%.    He has had recent weight gain from about 250 pounds in the summer to 264 pounds and feels less motivated to exercise.    He eats less fast food and mostly home-cooked meals but has eaten less healthily over the past 2 to 3 weeks with higher calorie intake during the holidays.    He received a pneumonia vaccine and needs a COVID vaccine, which was unavailable today.     He has no current concerns, including no heartburn, depression, anxiety, or sleep problems, and reports good mental health.          Problem List Review     I have reviewed and updated the problem list below and addressed acute and chronic conditions with patient or recommended a follow up plan.   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)    UTI (urinary tract infection)    Neurogenic bladder    S/P ileal conduit (CMS-HCC) Non-ischemic cardiomyopathy (CMS-HCC)    Essential hypertension    Urethral Kashus Karlen    Morbid obesity (CMS-HCC)    Seizure disorder (CMS-HCC)    Hydrocephalus (CMS-HCC)    Kidney Tyrone Pautsch    S/P VP shunt    Arnold-Chiari malformation (CMS-HCC)    ADD (attention deficit disorder)    Spina bifida (CMS-HCC)    Unspecified constipation        Other concerns addressed:  None      Risk Review   Lab Draw:  Lab Results   Component Value Date/Time    HGBA1C 7.8 (H) 06/02/2023 03:02 PM    HGBA1C 8.8 (H) 09/01/2022 03:14 PM    HGBA1C 8.0 (H) 10/28/2021 02:42 PM         Lab Results   Component Value Date    CHOL 118 12/02/2022    LDL 66 12/02/2022     The ASCVD Risk score (Arnett DK, et al., 2019) failed to calculate for the following reasons:    The 2019 ASCVD risk score is only valid for ages 26 to 7  Preventive Medications   Statin assessment: I have reviewed the patient's ASCVD risk and patient: Patient is on statin  Aspirin  assessment: Patient does have a history of cardiovascular disease and after risk benefit assessment, aspirin  not indicated.    Preventive Screening   I reviewed the preventive care and immunizations information with the patient and discussed a plan of care.    Health Maintenance   Topic Date Due    HIV SCREENING  Never done    HPV VACCINES (1 - Risk 3-dose SCDM series) Never done    COVID-19 VACCINE (5 - 2025-26 season) 09/12/2023    DIABETES MICROALBUMIN TO CREATININE RATIO  12/02/2023    DIABETES HBA1C  12/03/2023    DIABETES EGFR SCREEN  07/07/2024    DIABETES DILATED EYE EXAM  10/27/2024    DIABETES FOOT EXAM (.dmfoot)  12/13/2024    MEDICARE ANNUAL WELLNESS VISIT  12/13/2024    DTAP/TDAP VACCINES (10 - Td or Tdap) 03/01/2031    PNEUMOCOCCAL VACCINE  Completed    HEPATITIS C SCREENING  Completed    DEPRESSION SCREENING  Completed    INFLUENZA VACCINE  Completed    MENINGOCOCCAL B VACCINE  Aged Out         Health Risk Assessment Questionnaire     The patient completed a health risk assessment with results reviewed and addressed with patient.    Health Risk Assessment Questionnaire  Current Care  List of Providers you have seen in the last two years: Multiple  Are you receiving home health?: No  During the past 4 weeks, how would you rate your health in general?: Very Good    Outside Care  Since your last PCP visit, have you received care outside of [facility]?: No          Physical Activity  Do you exercise or are you physically active?: (!) No             Diet  In the past month, were you worried whether your food would run out before you or your family had money to buy more?: No  In the past 7 days, how many times did you eat fast food or junk food or pizza?: (!) 2  In the past 7 days, how many servings of fruits or vegetables did you eat each day?: (!) 0-1  In the past 7 days, how many sodas and sugar sweetened drinks (regular, not diet) did you drink each day?: 1    Smoke/Tobacco Use  Are you currently a smoker?: No       Alcohol Use  Do you drink alcohol?: No             Other Symptoms  How would you rate your pain today?: No pain  Do you feel tired during the day?: No  Do you feel stress - tense, restless, nervous, anxious - or unable to sleep at night because your mind is troubled?: No  Have you often felt angry or had difficulty controlling your temper?: No  Do you often feel lonely?: No    Ambulation  Do you use any assistive devices for ambulation?: No       Fall Risk  Does it take you longer than 30 seconds to get up and out of a chair?: No  Have you fallen in the past year?: No       Motor Vehicle Safety  Do you fasten your seat belt when you are in the car?: Yes    Sun Exposure  Do you protect yourself from the sun? For example, wear sunscreen when outside.:  Yes    Hearing Loss  Do you have trouble hearing the television or radio when others do not?: No  Do you have to strain or struggle to hear/understand conversation?: No  Do you use hearing aids?: No    Cognitive Impairment  During the past 12 months, have you experienced confusion or memory loss that is happening more often or is getting worse?: No    Functional Screen  Do you live alone?: Yes  Do you live at: Home  Can you drive your own car or travel alone by bus or taxi?: Yes  Can you shop for groceries or clothes without help?: Yes  Can you prepare your own meals?: Yes  Can you do your own housework (such as laundry or other household tasks) without help?: Yes  Can you handle your own money without help?: Yes  Do you need help eating, bathing, dressing, or getting around your home?: No  Do you feel safe?: Yes  Does anyone at home hurt you, hit you, or threaten you?: No  Have you ever been the victim of abuse?: No    Home Safety  Does your home have grab bars in the bathroom?: Yes  Does your home have hand rails on stairs and steps?: No stairs at home  Does your home have functioning smoke alarms?: Yes    Advance Directive  Do you have a living will or Advance Directive?: Yes       Dental Screen  Have you had an exam by your dentist in the last year?: Yes    Vision Screen  Do you have diabetes?: (!) Yes  When was your last eye exam?: 09/02/23  Eye Doctor Name?: Dr. [facility]  Eye Doctor Facility?: [facility]    Urinary Incontinence  Have you had urine leakage in the past 6 months?: No       Social Drivers of Health            History Review   Past Medical History[1]  Surgical History[2]   reports that he has never smoked. He has never used smokeless tobacco. He reports that he is not currently sexually active and has had partner(s) who are male. He reports that he does not drink alcohol and does not use drugs.  Family History[3]  Vaping/E-liquid Use    Vaping Use Never User               Allergies[4]  Review of Systems       Review of Systems    OBJECTIVE     Vitals:    12/14/23 1409   BP: 126/84   BP Source: Arm, Right Upper   Pulse: 114   Temp: 36.2 ?C (97.2 ?F)   SpO2: 96%   TempSrc: Temporal   PainSc: Zero   Weight: 120.1 kg (264 lb 12.8 oz)   Height: 163.5 cm (5' 4.37)     Body mass index is 44.93 kg/m?SABRA   Physical Exam  Constitutional:       Appearance: Normal appearance.   Cardiovascular:      Rate and Rhythm: Normal rate and regular rhythm.   Pulmonary:      Effort: Pulmonary effort is normal.      Breath sounds: Normal breath sounds.   Neurological:      Mental Status: He is alert and oriented to person, place, and time. Mental status is at baseline.   Psychiatric:         Mood and Affect: Mood  normal.         Behavior: Behavior normal.            Depression Screening:  Depression screening 5 minutes  Patient Scores:  : PHQ-2 Score: (Patient-Rptd) 0 (12/07/2023  9:39 AM)    Patient Health Questionnaire (PHQ)-9: No data recorded  Interventions:  Patient Health Questionnaire (PHQ)-2: PHQ-2 Score less than 3: No follow-up or recommendations are necessary at this time (10/26/2023  1:51 PM)    Depression Interventions Patient Health Questionnaire (PHQ)-2/9: No data recorded   Cognitive Assessment  No deficits   No data recorded       SLUMS Exam Scoring  High School Education  Less than High School Education   27-30 Normal 25-30   21-26 MNCD* 20-24   1-20 Dementia 1-19   *Mild Neurocognitive Disorder     Get-up-and-go Test  Less than 12 seconds, normal    Medications   I have completed a medication reconciliation, discussed medication adherence, and reviewed the list for high-risk medications (BEERS) and results are below:      acetaminophen  (TYLENOL  EXTRA STRENGTH) 500 mg tablet Take two tablets by mouth every 6 hours as needed for Pain. Max of 4,000 mg of acetaminophen  in 24 hours.  Indications: pain    bacitracin  (BACTERICIN) 500 unit/gram topical ointment Apply  topically to affected area twice daily.    blood sugar diagnostic (ONETOUCH ULTRA TEST) test strip Use to check blood sugars daily. E11.65    blood-glucose meter (ACCU-CHEK GUIDE GLUCOSE METER) kit Use one strip as directed daily. Diagnosis Code: DM-2 blood-glucose sensor (FREESTYLE LIBRE 3 PLUS SENSOR) sensor device Use to check blood sugars continuously. Replace every 15 days.    carvediloL  (COREG ) 6.25 mg tablet take 2 tablets BY MOUTH TWICE DAILY WITH MEALS    cholecalciferol (+) (VITAMIN D3) 2,000 unit tablet Take one tablet by mouth daily.    cyanocobalamin  (vitamin B-12) 3,000 mcg cap Take one capsule by mouth daily.    ezetimibe  (ZETIA ) 10 mg tablet TAKE 1 TABLET BY MOUTH once daily    ferrous sulfate  (SLOW RELEASE IRON ) 142 mg (45 mg iron ) ER tablet Take one tablet by mouth daily.    furosemide  (LASIX ) 20 mg tablet Take one tablet by mouth daily as needed.    insulin  glargine (LANTUS  SOLOSTAR U-100 INSULIN ) 100 unit/mL (3 mL) subcutaneous PEN Inject twenty six Units to fifty Units under the skin at bedtime daily. Max 50 units/day    insulin  pen needles (disposable) (COMFORT EZ PEN NEEDLES) 31 gauge x 5/16 pen needle Use to inject insulin  1x/day    lancets 33 gauge 33 gauge Use to check blood sugars daily E11.65    Leg Brace misc Bilateral Fitted leg brace and right shoe for foot drop.  Dx: spina bifida.    levETIRAcetam  (KEPPRA ) 500 mg tablet Take one tablet by mouth twice daily.    loperamide (IMODIUM A-D) 2 mg capsule Take one capsule by mouth as Needed for Diarrhea.    losartan  (COZAAR ) 50 mg tablet Take 1 tablet by mouth daily    metFORMIN -XR (GLUCOPHAGE  XR) 500 mg extended release tablet TAKE 1 TABLET BY MOUTH IN THE MORNING AND 2 tablets IN THE EVENING with meals    Miscellaneous Medical Supply misc CPAP- auto PAP with heated humdification, 4-20, with mask of patient's choice.  Indications: severe sleep apnea    Miscellaneous Medical Supply misc Shoe lift for right foot.  Dx: spina bifida    multivit-min/folic/vit K/lycop (ONE-A-DAY MEN'S MULTIVITAMIN PO) Take  1 tablet by mouth at bedtime daily.    Ostomy Supplies 1  misc [employer] 1 piece Urostomy bag.  Use 1 bag as needed.    psyllium seed (with sugar) (FIBER PO) Take 1 tablet by mouth twice daily.    rosuvastatin  (CRESTOR ) 40 mg tablet TAKE 1 TABLET BY MOUTH at bedtime    sennosides-docusate sodium  (SENOKOT-S) 8.6/50 mg tablet Take one tablet by mouth twice daily. Indications: constipation    spironolactone  (ALDACTONE ) 25 mg tablet take 2 tablets BY MOUTH ONCE daily WITH FOOD    tirzepatide  (MOUNJARO ) 12.5 mg/0.5 mL injector PEN Inject 0.5 mL under the skin every 7 days.    triamcinolone  acetonide (TRIDERM ) 0.1 % topical cream Apply  topically to affected area twice daily.    VASCEPA  1 gram capsule TAKE TWO CAPSULES BY MOUTH TWICE DAILY WITH MEALS          ASSESSMENT AND PLAN       Personal prevention plan reviewed with patient and is accessible via patient's After Visit Summary and visit note.    Allen Gill was seen today for annual wellness visit.    Diagnoses and all orders for this visit:    Encounter for Medicare annual wellness exam    Need for pneumococcal vaccine  -     PNEUMOCOCCAL VACCINE 20-VAL    Essential hypertension    Type 2 diabetes mellitus with hyperglycemia, with long-term current use of insulin  (CMS-HCC)          Assessment & Plan  Adult Wellness Visit  Annual wellness visit conducted. No acute concerns. Weight increased to 264 lbs. No sleep or mental health issues. Engaging in healthier eating habits.  - Continue current dietary habits with reduced fast food intake.  - Encouraged regular exercise to aid in weight management.  - Schedule follow-up in June.  - Reviewed HRA    Type 2 diabetes mellitus  Blood glucose levels well-controlled, ranging from 97 to 126 mg/dL. Last A1c was 7.8%, expected to improve based on current readings.  - Continue current diabetes management regimen.  - Monitor blood glucose levels regularly.  - Review A1c results when available.    Essential hypertension, controlled  - Continue current antihypertensive regimen.    Immunization management  Received pneumonia vaccine today      Patient Instructions     Health Maintenance   Topic Date Due    HIV SCREENING  Never done    PNEUMOCOCCAL VACCINE (1 of 2 - PCV) Never done    HPV VACCINES (1 - Risk 3-dose SCDM series) Never done    COVID-19 VACCINE (5 - 2025-26 season) 09/12/2023    DIABETES FOOT EXAM (.dmfoot)  12/02/2023    DIABETES MICROALBUMIN TO CREATININE RATIO  12/02/2023    DIABETES HBA1C  12/03/2023    DIABETES EGFR SCREEN  07/07/2024    DIABETES DILATED EYE EXAM  10/27/2024    MEDICARE ANNUAL WELLNESS VISIT  12/13/2024    DTAP/TDAP VACCINES (10 - Td or Tdap) 03/01/2031    HEPATITIS C SCREENING  Completed    DEPRESSION SCREENING  Completed    INFLUENZA VACCINE  Completed    MENINGOCOCCAL B VACCINE  Aged Out      Visit Disposition       Dispositions    Return in about 6 months (around 06/13/2024) for DM f/u.                            [  1]   Past Medical History:  Diagnosis Date    ADD (attention deficit disorder)     without hyperactivity    Allergy     Arnold-Chiari malformation (CMS-HCC)     Cardiomyopathy (CMS-HCC)     Diabetes mellitus (CMS-HCC)     Headache(784.0)     Hydrocephalus (CMS-HCC)     Hypertension     Infection of VP shunt     Kidney stones     frequent    Lung disease     Myelomeningocele (CMS-HCC)     Neurogenic bladder     OSA (obstructive sleep apnea)     Preop cardiovascular exam 07/30/2016    Seizures (CMS-HCC)     blank staring spells in childhood - last absentee seizure 2012    Shunt malfunction     Spina bifida (CMS-HCC)     Tachycardia 07/30/2016    Urinary retention 02/19/2016    Added automatically from request for surgery [id number]    Urinary tract infection     frequent   [2]   Past Surgical History:  Procedure Laterality Date    CATHETER IMPLANT/REVISION  12/27/09    distal end of the catheter was revised    CATHETER IMPLANT/REVISION  01/31/10    replacement of ventricular catheter with [employer] framelessstereotaxis catheter    CSF SHUNT Left 12/22/2016    CREATION SHUNT - VENTRICULO-PERITONEAL: Left side. 2 hours, proximal catheter and valve in place. will need distal catheter placed, Dr. Shirlean to complete. Supine performed by Idelia Lango, MD at [facility] OR/[facility]iop    CSF SHUNT Left 12/16/2016    REVISION SHUNT - VENTRICULO-PERITONEAL performed by Vivica Domino, MD at [facility]    CSF SHUNT Left 06/03/2015    SHUNT CREATION VENTRICULAR PERITONEAL performed by Idelia L[facility], [facility]    CSF SHUNT Right 06/14/2014    SHUNT CREATION VENTRICULAR PERITONEAL performed by Idelia Lango, MD at [facility]    CSF SHUNT Right 08/04/2017    CREATION SHUNT - VENTRICULO-PERITONEAL performed by Idelia Lango, MD at [facility]    CYSTECTOMY N/A 10/13/2016    CYSTECTOMY, ILEAL CONDUIT performed by Carolynn Silversmith, MD at [facility]    CYSTOSCOPY N/A 02/20/2016    CYSTOSCOPY, URETHERAL DILATION performed by Gwenn Kirke DASEN, MD at [facility]    CYSTOSCOPY N/A 08/29/2015    CYSTOSCOPY EVACUATION CLOTS performed by Gwenn Kirke DASEN, MD at [facility]    CYSTOSCOPY N/A 08/19/2015    CYSTOSCOPY performed by Gwenn Kirke DASEN, MD at [facility]    CYSTOSCOPY N/A 08/05/2015    CYSTOURETHROSCOPY, CYSTOLITHOLAPAXY performed by Ronold Rubin, MD at [facility]    CYSTOURETHROSCOPY Right 06/07/2019    CYSTOURETHROSCOPY WITH URETEROSCOPY AND/ OR PYELOSCOPY - WITH REMOVAL/ MANIPULATION CALCULUS performed by Lenette Tanda SAUNDERS, MD at [facility] OR    HARDWARE REMOVAL Left 05/20/2015    REMOVAL HARDWARE HEAD: removal of left ventriculopleural shunt performed by Idelia Lango, MD at [facility]    HX ABDOMEN SURGERY      fundoplication    HX ABDOMEN SURGERY  01/2001    Cecostomy    HX BACK SURGERY      repair of spina bifida    HX BRAIN SURGERY  5 months old    Chiari decompression    HX EAR TUBES      HX HERNIA REPAIR      inguinal hernia    HX SURGERY  at 2 weeks    VP Shunt  HX TONSILLECTOMY      HX TRACHEOSTOMY      removed at age 43    KIDNEY Verta Riedlinger SURGERY Left 05/25/2017    PERCUTANEOUS NEPHROSTOLITHOTOMY/ PYELOSTOLITHOTOMY - GREATER THAN 2 CM performed by Lenette Blush, MD, [facility]    KIDNEY Shacola Schussler SURGERY Right 06/07/2019    PERCUTANEOUS NEPHROSTOLITHOTOMY/ PYELOSTOLITHOTOMY - 2 CM OR LESS performed by Lenette Blush SAUNDERS, MD at [facility] OR    PR [facility] PROGRAMMABLE CEREBROSPINAL SHUNT  01/07/2017    PROSTATECTOMY N/A 08/19/2015    HOLMIUM LASER ENUCLEATION OF PROSTATE (NO MORCELLATION) performed by Gwenn Kirke DASEN, MD at [facility]    SHUNT REMOVAL Right 05/23/2015    SHUNT REMOVAL VENTRICULAR PERITONEAL performed by Idelia Lango, MD at [facility]    SHUNT REVISION  3 months old    SHUNT REVISION  33 years old    SHUNT REVISION  October 2010    [place]    SHUNT REVISION  12/11/09    replacment of valve to acodman hakem adjustablevalve    SHUNT REVISION  02/17/10    left frontal ventriculopleural shunt    SHUNT REVISION Right 04/23/2015    SHUNT REVISION VENTRICULAR PERITONEAL performed by Idelia Lango, MD at [facility]    URETER SURGERY Right 04/28/2022    URETERONEOCYSTOSTOMY - VESICO-PSOAS HITCH/ BLADDER FLAP performed by Carolynn Ruther ORN, MD at [facility] OR    VENTRICULOSTOMY  02/03/10    removal of all shunt components and placementofright frontal ventriculostomy   [3]   Family History  Problem Relation Age of Onset    Diabetes Father     Hypertension Father     Other Father         glaucoma    High Cholesterol Maternal Grandfather    [4]   Allergies  Allergen Reactions    Latex RASH and SHORTNESS OF BREATH    Amoxicillin  RASH and STOMACH UPSET     08/03/17 discussed this w/ patient. Amox/clav in 2014. He certainly took it.  Notes from that time indicate diarrhea. Discussed this with him on 03/13/18 and he stated that he'd taken amox 3 times in past and each time had HA, debilitating fatigue and diarrhea. And maybe rash. At this time he was tolerating IV ampicillin . Doria, MD 03/13/18  Update Luchi MD, 11/28/18: He got IV ampicillin  at [facility] 2/29 to 03/15/18 w/o rash or SE. He was started on Augmentin  05/08/19 without significant SE    Ceclor [Cefaclor] HIVES     Pt has tolerated ancef , keflex , ceftriaxone  and cefepime  (many prescriptions for these in med review in [place].    Clindamycin HIVES    Zosyn  [Piperacillin -Tazobactam] HIVES

## 2023-12-14 NOTE — Patient Instructions [37]
 Health Maintenance   Topic Date Due    HIV SCREENING  Never done    PNEUMOCOCCAL VACCINE (1 of 2 - PCV) Never done    HPV VACCINES (1 - Risk 3-dose SCDM series) Never done    COVID-19 VACCINE (5 - 2025-26 season) 09/12/2023    DIABETES FOOT EXAM (.dmfoot)  12/02/2023    DIABETES MICROALBUMIN TO CREATININE RATIO  12/02/2023    DIABETES HBA1C  12/03/2023    DIABETES EGFR SCREEN  07/07/2024    DIABETES DILATED EYE EXAM  10/27/2024    MEDICARE ANNUAL WELLNESS VISIT  12/13/2024    DTAP/TDAP VACCINES (10 - Td or Tdap) 03/01/2031    HEPATITIS C SCREENING  Completed    DEPRESSION SCREENING  Completed    INFLUENZA VACCINE  Completed    MENINGOCOCCAL B VACCINE  Aged Out

## 2023-12-14 NOTE — Progress Notes [1]
Patient came into office for PCV 20  injection.  Patient tolerated well. Injection was given in the Right deltoid .  Patient signed Consent and was given the VIS.  Pt able to ambulate out of office on their own.  Vaccine was documented in the patients chart.

## 2023-12-15 ENCOUNTER — Encounter: Admit: 2023-12-15 | Discharge: 2023-12-15 | Payer: MEDICARE

## 2023-12-19 ENCOUNTER — Emergency Department: Admit: 2023-12-19 | Discharge: 2023-12-19 | Payer: MEDICARE

## 2023-12-19 ENCOUNTER — Encounter: Admit: 2023-12-19 | Discharge: 2023-12-19 | Payer: MEDICARE

## 2023-12-19 DIAGNOSIS — N1330 Unspecified hydronephrosis: Secondary | ICD-10-CM

## 2023-12-19 DIAGNOSIS — R519 Nonintractable headache, unspecified chronicity pattern, unspecified headache type: Secondary | ICD-10-CM

## 2023-12-19 DIAGNOSIS — E1165 Type 2 diabetes mellitus with hyperglycemia: Secondary | ICD-10-CM

## 2023-12-19 DIAGNOSIS — N390 Urinary tract infection, site not specified: Principal | ICD-10-CM

## 2023-12-19 DIAGNOSIS — N12 Tubulo-interstitial nephritis, not specified as acute or chronic: Secondary | ICD-10-CM

## 2023-12-19 MED ORDER — IOHEXOL 350 MG IODINE/ML IV SOLN
100 mL | Freq: Once | INTRAVENOUS | 0 refills | Status: CP
Start: 2023-12-19 — End: ?
  Administered 2023-12-20: 02:00:00 100 mL via INTRAVENOUS

## 2023-12-19 MED ORDER — ENOXAPARIN 40 MG/0.4 ML SC SYRG
40 mg | Freq: Every day | SUBCUTANEOUS | 0 refills | Status: DC
Start: 2023-12-19 — End: 2023-12-20

## 2023-12-19 MED ORDER — EZETIMIBE 10 MG PO TAB
10 mg | Freq: Every day | ORAL | 0 refills | Status: DC
Start: 2023-12-19 — End: 2023-12-21
  Administered 2023-12-20 – 2023-12-21 (×2): 10 mg via ORAL

## 2023-12-19 MED ORDER — OXYCODONE 5 MG PO TAB
5 mg | ORAL | 0 refills | Status: DC | PRN
Start: 2023-12-19 — End: 2023-12-21

## 2023-12-19 MED ORDER — ONDANSETRON 4 MG PO TBDI
4 mg | ORAL | 0 refills | Status: DC | PRN
Start: 2023-12-19 — End: 2023-12-21

## 2023-12-19 MED ORDER — SPIRONOLACTONE 50 MG PO TAB
50 mg | Freq: Every day | ORAL | 0 refills | Status: DC
Start: 2023-12-19 — End: 2023-12-21
  Administered 2023-12-20 – 2023-12-21 (×2): 50 mg via ORAL

## 2023-12-19 MED ORDER — ACETAMINOPHEN 500 MG PO TAB
1000 mg | Freq: Once | ORAL | 0 refills | Status: CP
Start: 2023-12-19 — End: ?
  Administered 2023-12-20: 03:00:00 1000 mg via ORAL

## 2023-12-19 MED ORDER — LOPERAMIDE 2 MG PO CAP
2 mg | ORAL | 0 refills | Status: DC | PRN
Start: 2023-12-19 — End: 2023-12-21

## 2023-12-19 MED ORDER — LEVETIRACETAM 500 MG PO TAB
500 mg | Freq: Two times a day (BID) | ORAL | 0 refills | Status: DC
Start: 2023-12-19 — End: 2023-12-21
  Administered 2023-12-20 – 2023-12-21 (×3): 500 mg via ORAL

## 2023-12-19 MED ORDER — POLYETHYLENE GLYCOL 3350 17 GRAM PO PWPK
1 | Freq: Every day | ORAL | 0 refills | Status: DC | PRN
Start: 2023-12-19 — End: 2023-12-21

## 2023-12-19 MED ORDER — CEFTRIAXONE INJ 2GM IVP
2 g | Freq: Once | INTRAVENOUS | 0 refills | Status: CP
Start: 2023-12-19 — End: ?
  Administered 2023-12-20: 02:00:00 2 g via INTRAVENOUS

## 2023-12-19 MED ORDER — LOSARTAN 50 MG PO TAB
50 mg | Freq: Every day | ORAL | 0 refills | Status: DC
Start: 2023-12-19 — End: 2023-12-21
  Administered 2023-12-20 – 2023-12-21 (×2): 50 mg via ORAL

## 2023-12-19 MED ORDER — CYANOCOBALAMIN (VITAMIN B-12) 500 MCG PO TAB
1000 ug | Freq: Every day | ORAL | 0 refills | Status: DC
Start: 2023-12-19 — End: 2023-12-21
  Administered 2023-12-20 – 2023-12-21 (×2): 1000 ug via ORAL

## 2023-12-19 MED ORDER — CEFTRIAXONE INJ 1GM IVP
1 g | INTRAVENOUS | 0 refills | Status: DC
Start: 2023-12-19 — End: 2023-12-21
  Administered 2023-12-21: 02:00:00 1 g via INTRAVENOUS

## 2023-12-19 MED ORDER — ACETAMINOPHEN 500 MG PO TAB
1000 mg | ORAL | 0 refills | Status: DC | PRN
Start: 2023-12-19 — End: 2023-12-21

## 2023-12-19 MED ORDER — INSULIN GLARGINE 100 UNIT/ML (3 ML) SC INJ PEN
26 [IU] | Freq: Every day | SUBCUTANEOUS | 0 refills | Status: DC
Start: 2023-12-19 — End: 2023-12-21
  Administered 2023-12-20 – 2023-12-21 (×2): 26 [IU] via SUBCUTANEOUS

## 2023-12-19 MED ORDER — ONDANSETRON HCL (PF) 4 MG/2 ML IJ SOLN
4 mg | INTRAVENOUS | 0 refills | Status: DC | PRN
Start: 2023-12-19 — End: 2023-12-21

## 2023-12-19 MED ORDER — CHOLECALCIFEROL (VITAMIN D3) 25 MCG (1,000 UNIT) PO TAB
2000 [IU] | Freq: Every day | ORAL | 0 refills | Status: DC
Start: 2023-12-19 — End: 2023-12-21
  Administered 2023-12-20 – 2023-12-21 (×2): 2000 [IU] via ORAL

## 2023-12-19 MED ORDER — CARVEDILOL 12.5 MG PO TAB
12.5 mg | Freq: Two times a day (BID) | ORAL | 0 refills | Status: DC
Start: 2023-12-19 — End: 2023-12-21
  Administered 2023-12-20 – 2023-12-21 (×3): 12.5 mg via ORAL

## 2023-12-19 MED ORDER — PSYLLIUM SEED (ASPARTAME) PO POWD
3.4 g | Freq: Two times a day (BID) | ORAL | 0 refills | Status: DC
Start: 2023-12-19 — End: 2023-12-20

## 2023-12-19 MED ORDER — ROSUVASTATIN 20 MG PO TAB
40 mg | Freq: Every evening | ORAL | 0 refills | Status: DC
Start: 2023-12-19 — End: 2023-12-21
  Administered 2023-12-21: 02:00:00 40 mg via ORAL

## 2023-12-19 MED ORDER — KETOROLAC 15 MG/ML IJ SOLN
15 mg | Freq: Once | INTRAVENOUS | 0 refills | Status: CP
Start: 2023-12-19 — End: ?
  Administered 2023-12-20: 03:00:00 15 mg via INTRAVENOUS

## 2023-12-19 MED ORDER — SODIUM CHLORIDE 0.9 % IJ SOLN
50 mL | Freq: Once | INTRAVENOUS | 0 refills | Status: CP
Start: 2023-12-19 — End: ?
  Administered 2023-12-20: 02:00:00 50 mL via INTRAVENOUS

## 2023-12-19 MED ORDER — LACTATED RINGERS IV BOLUS
1000 mL | Freq: Once | INTRAVENOUS | 0 refills | Status: CP
Start: 2023-12-19 — End: ?
  Administered 2023-12-20: 03:00:00 1000 mL via INTRAVENOUS

## 2023-12-19 MED ORDER — MELATONIN 5 MG PO TAB
5 mg | Freq: Every evening | ORAL | 0 refills | Status: DC | PRN
Start: 2023-12-19 — End: 2023-12-21

## 2023-12-19 NOTE — ED Notes [6]
 Headache 2-3 hours; hx vp shunt; right side; 6; last issue with VP shunt 5-6 years ago; no vision changes, normally starts 5-6 hours vision changes and decrease in hand eye coordination; 4mm perrl    Flank pain x24 hours; blood in urostomy; right side; blood in bag prior to changing bag Sunday morning 0530; drops from penis, denies symptoms; denies fevers    Allen Gill is a 33 y.o. gender male pt presents to ED 92 with CC flank pain and headache.     Pt reports he has been experiencing right side flank pain for approximately 24 hours at this time. Pt noticed blood in his urostomy bag yesterday morning upon waking at about 0530. He immediately changed his bag and started pushing PO fluids. He denies any additional blood in urine after changing bag. Flank pain persists. Pt reports he wears a brief because he dribbles but does void next to any urine through his penis. Pt denies any urinary symptoms to include pain or fevers.    Pt is more concerned at this time for a right sided headache that started about 3 hours ago. Pt reports a history of a VP shunt, last revision 5-6 years ago. Pt is AO4, denies any confusion, vision changes, photophobia. Pt does state that this headache is similar to the past revision he had and at about the 5-6 hour duration point he experiences vision changes and decrease in hand eye coordination. Otherwise assymptomatic at this point with regards to headache.    Pt denies cp, sob, nvd, fever chills, sick contacts and/or further complaints at this time. Pt A&Ox4, respirations even unlabored on ra, skin warm/dry, capillary refill <2 seconds, and mucous membranes pink/moist. Pt placed on spo2 bp cardiac monitoring. Pt resting on locked cart lowest position with call light within reach.    Past Medical History:    ADD (attention deficit disorder)    Allergy    Arnold-Chiari malformation (CMS-HCC)    Cardiomyopathy (CMS-HCC)    Diabetes mellitus (CMS-HCC)    Headache(784.0)    Hydrocephalus (CMS-HCC)    Hypertension    Infection of VP shunt    Kidney stones    Lung disease    Myelomeningocele (CMS-HCC)    Neurogenic bladder    OSA (obstructive sleep apnea)    Preop cardiovascular exam    Seizures (CMS-HCC)    Shunt malfunction    Spina bifida (CMS-HCC)    Tachycardia    Urinary retention    Urinary tract infection       Belongings: shirt pants shoes undergarments remain worn; smart phone in hand; pt accounting for belongings at this time

## 2023-12-19 NOTE — ED Notes [6]
 ED Initial Provider Note:    This patient was seen in the ED triage area to initiate and expedite the patients ED care when possible.    ED Chief Complaint:   Chief Complaint   Patient presents with    Flank Pain     Right side. Reports headache that made him concerned thus he came in. States he has had the headache for a few hours. Has a VP shunt and wants to make sure the headache is not related.     Headache       S: Allen Gill is a 33 y.o. male who presents to the Emergency Department for flank pain. Patient reports right-sided flank pain for 24 hours.  He reports noticing blood in his urostomy bag that later resolved after drinking fluids.  Flank pain has persisted.  He also notes he developed a headache soon after flank pain and endorses concerns given his history of VP shunt and wants to make sure everything is okay, prompting his visit.    PMHx:  Past Medical History:    ADD (attention deficit disorder)    Allergy    Arnold-Chiari malformation (CMS-HCC)    Cardiomyopathy (CMS-HCC)    Diabetes mellitus (CMS-HCC)    Headache(784.0)    Hydrocephalus (CMS-HCC)    Hypertension    Infection of VP shunt    Kidney stones    Lung disease    Myelomeningocele (CMS-HCC)    Neurogenic bladder    OSA (obstructive sleep apnea)    Preop cardiovascular exam    Seizures (CMS-HCC)    Shunt malfunction    Spina bifida (CMS-HCC)    Tachycardia    Urinary retention    Urinary tract infection       BP 105/65  - Pulse 99  - Temp 36.9 ?C (98.4 ?F)  - Wt 119.7 kg (264 lb)  - SpO2 94%  - BMI 44.80 kg/m?    O: Brief Physical: No acute distress.  Respirations even and unlabored.  Patient alert and oriented x 4.    A/P: The patient was seen by me as an initial provider in triage. A brief history and physical was obtained. My exam is intended to be an initial medial screening exam. Initial orders have been placed by me. My working diagnosis is shunt malfunction, kidney stone, UTI, pyelonephritis, dehydration.    The patient is deemed appropriate for the main ED. The patient's care will be resumed by the ED provider care team once the patient is roomed in the ED. A more detailed / complete H&P will be documented by those providers.

## 2023-12-19 NOTE — H&P [4]
 Admission History and Physical Examination    Name: Allen Gill   MRN: [id number]     DOB: 1990-02-22      Age: 33 y.o.  Admission Date: 12/19/2023     LOS: 0 days     Date of Service: 12/19/2023                       Assessment/Plan:    Principal Problem:    UTI (urinary tract infection)  Active Problems:    Spina bifida (CMS-HCC)    Arnold-Chiari malformation (CMS-HCC)    S/P VP shunt    Morbid obesity (CMS-HCC)    Seizure disorder (CMS-HCC)    Essential hypertension    Non-ischemic cardiomyopathy (CMS-HCC)    S/P ileal conduit (CMS-HCC)    Neurogenic bladder    GERD (gastroesophageal reflux disease)    Type 2 diabetes mellitus with hyperglycemia (CMS-HCC)    Elevated triglycerides with high cholesterol    #UTI  #Neurogenic bladder s/p ileal conduit and bilateral ureteral reattachment   -Patient is not septic. UA shows bacteria, leukocytes, RBC, and nitrites. CT A/P shows right ureteral fat stranding and mild right sided hydroureteronephrosis. Creatinine at baseline.  -Blood and urine cultures collected in the ER; follow results  -Started on ceftriaxone , continue  -Multimodal pain control  -Urology consulted    #HF with recovered EF  #HTN  -Continue PTA carvedilol  and losartan . Hold PRN Lasix  with infection    #T2DM  -Continue PTA Lantus  26U daily. Hold metformin  while inpatient. SSI added    #DLP  -Continue PTA rosuvastatin  and Zetia     #Chiari malformation/hydrocephalus s/p VP shunt placement  -Shunt series and CT head in the ER unremarkable    Diet: Regular   DVT prophylaxis: SCD with reported hematuria  Code status: Full Code     High medical decision making due to the following:  1 acute or chronic illness that poses a threat to life or bodily function  decision regarding hospitalization __________________________________________________________________________________  Primary Care Physician: Bethena Harlene LABOR      Chief Complaint:  Hematuria and flank pain  History of Present Illness: Allen Gill is a 33 y.o. 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 Pain is sharp, radiates to the groin. Patient noted frank blood in his ostomy bag today, so he changed the bag and started drinking more fluids. Since then, he has not seen any frank blood in his urine. Denies blood clots in his urine. Denies fever, chills, rigors, nausea, vomiting, or diarrhea.       Past Medical History:    ADD (attention deficit disorder)    Allergy    Arnold-Chiari malformation (CMS-HCC)    Cardiomyopathy (CMS-HCC)    Diabetes mellitus (CMS-HCC)    Headache(784.0)    Hydrocephalus (CMS-HCC)    Hypertension    Infection of VP shunt    Kidney stones    Lung disease    Myelomeningocele (CMS-HCC)    Neurogenic bladder    OSA (obstructive sleep apnea)    Preop cardiovascular exam    Seizures (CMS-HCC)    Shunt malfunction    Spina bifida (CMS-HCC)    Tachycardia    Urinary retention    Urinary tract infection  Surgical History:   Procedure Laterality Date    SHUNT CREATION VENTRICULAR PERITONEAL Right 06/14/2014    Performed by Idelia Lango, MD at [facility] OR    SHUNT REVISION VENTRICULAR PERITONEAL Right 04/23/2015    Performed by Idelia Lango, MD at [facility] OR    REMOVAL HARDWARE HEAD: removal of left ventriculopleural shunt Left 05/20/2015    Performed by Idelia Lango, MD at [facility] OR    SHUNT REMOVAL VENTRICULAR PERITONEAL Right 05/23/2015    Performed by Idelia Lango, MD at [facility] OR    SHUNT CREATION VENTRICULAR PERITONEAL Left 06/03/2015    Performed by Idelia Lango, MD at [facility] OR    CYSTOURETHROSCOPY, CYSTOLITHOLAPAXY N/A 08/05/2015    Performed by Ronold Rubin, MD at [facility] OR    HOLMIUM LASER ENUCLEATION OF PROSTATE (NO MORCELLATION) N/A 08/19/2015    Performed by Gwenn Kirke DASEN, MD at [facility] OR    CYSTOSCOPY N/A 08/19/2015    Performed by Gwenn Kirke DASEN, MD at [facility] OR    CYSTOSCOPY EVACUATION CLOTS N/A 08/29/2015    Performed by Gwenn Kirke DASEN, MD at [facility] OR    CYSTOSCOPY, URETHERAL DILATION N/A 02/20/2016    Performed by Gwenn Kirke DASEN, MD at [facility] OR    CYSTECTOMY, ILEAL CONDUIT N/A 10/13/2016    Performed by Carolynn Silversmith, MD at [facility] OR    REVISION SHUNT - VENTRICULO-PERITONEAL Left 12/16/2016    Performed by Vivica Domino, MD at [facility] OR    CREATION SHUNT - VENTRICULO-PERITONEAL: Left side. 2 hours, proximal catheter and valve in place. will need distal catheter placed, Dr. Shirlean to complete. Supine Left 12/22/2016    Performed by Idelia Lango, MD at [facility] OR    PR [facility] PROGRAMMABLE CEREBROSPINAL SHUNT  01/07/2017    PERCUTANEOUS NEPHROSTOLITHOTOMY/ PYELOSTOLITHOTOMY - GREATER THAN 2 CM Left 05/25/2017    Performed by Lenette Blush, MD, [facility] OR    CREATION SHUNT - VENTRICULO-PERITONEAL Right 08/04/2017    Performed by Idelia Lango, MD at [facility] OR    PERCUTANEOUS NEPHROSTOLITHOTOMY/ PYELOSTOLITHOTOMY - 2 CM OR LESS Right 06/07/2019    Performed by Lenette Blush SAUNDERS, MD at [facility] OR    CYSTOURETHROSCOPY WITH URETEROSCOPY AND/ OR PYELOSCOPY - WITH REMOVAL/ MANIPULATION CALCULUS Right 06/07/2019    Performed by Lenette Blush SAUNDERS, MD at [facility] OR    URETERONEOCYSTOSTOMY - VESICO-PSOAS HITCH/ BLADDER FLAP Right 04/28/2022    Performed by Carolynn Silversmith ORN, MD at [facility] OR    CATHETER IMPLANT/REVISION  12/27/09    distal end of the catheter was revised    CATHETER IMPLANT/REVISION  01/31/10    replacement of ventricular catheter with [employer] framelessstereotaxis catheter    HX ABDOMEN SURGERY      fundoplication    HX ABDOMEN SURGERY  01/2001    Cecostomy    HX BACK SURGERY      repair of spina bifida    HX BRAIN SURGERY  5 months old    Chiari decompression    HX EAR TUBES      HX HERNIA REPAIR      inguinal hernia    HX SURGERY  at 2 weeks    VP Shunt    HX TONSILLECTOMY      HX TRACHEOSTOMY removed at age 46    SHUNT REVISION  58 months old    SHUNT REVISION  33 years old    SHUNT REVISION  October 2010    [place]    SHUNT REVISION  12/11/09  replacment of valve to acodman hakem adjustablevalve    SHUNT REVISION  02/17/10    left frontal ventriculopleural shunt    VENTRICULOSTOMY  02/03/10    removal of all shunt components and placementofright frontal ventriculostomy     Family History   Problem Relation Name Age of Onset    Diabetes Father Brodey Bonn     Hypertension Father Odus Clasby     Other Father Praveen Coia         glaucoma    High Cholesterol Maternal Grandfather Remmel Conyers      Social History     Tobacco Use    Smoking status: Never    Smokeless tobacco: Never   Vaping Use    Vaping status: Never Used   Substance and Sexual Activity    Alcohol use: Never    Drug use: Never    Sexual activity: Not Currently     Partners: Female           Immunizations (includes history and patient reported):   Immunization History   Administered Date(s) Administered    COVID-19 ([employer]ON [employer] JOHNSON/JANSSEN) recombinant protein vacc, 0.5 mL (PF) 03/22/2019    COVID-19 (PFIZER), mRNA vacc, 30 mcg/0.3 mL (PF) 03/13/2020    COVID-19 Bivalent (32YR+)(PFIZER), mRNA vacc, 41mcg/0.3mL 10/29/2020    Covid-19 mRNA Vaccine >=12yo (Moderna)(Spikevax) 12/02/2022    Covid-19 mRNA Vaccine >=12yo (Pfizer)(Comirnaty) 10/16/2021    DTaP Vaccine 01/12/1989, 02/11/1989, 04/18/1989, 03/17/1990, 08/04/1994    FLU VACCINE >3YO 10/28/2006, 10/24/2007    FLU VACCINE >3YO (Preservative Free) 10/29/2008, 11/27/2009    Flu Vaccine =>6 Months Quadrivalent PF 10/29/2020    Flu Vaccine =>6 Months Trivalent PF 12/02/2022, 10/25/2023    Flu Vaccine Cell Cult Quad 4+YO 10/16/2021    HEPATITIS B vaccine, unspecified (Historical) 06/19/1991, 07/18/1991, 12/10/1991    HIB Vaccine 01/12/1989, 02/11/1989, 04/18/1989, 01/09/1990    IPV 01/12/1989, 02/11/1989, 03/17/1990, 08/04/1994    MMR Vaccine 01/09/1990, 08/04/1994    Meningococcal Vaccine 08/27/2004    Pneumococcal Vaccine (20-Val) 12/14/2023    Td Vaccine 07/12/2000    Tdap Vaccine 07/12/2000, 05/25/2018, 02/28/2021    Tuberculin Skin Test, Unspecified 05/10/2019, 05/20/2019, 06/13/2019    Varicella Vaccine Live 10/08/2005           Allergies:  Latex, Amoxicillin , Ceclor [cefaclor], Clindamycin, and Zosyn  [piperacillin -tazobactam]    Medications:  (Not in a hospital admission)   Current Facility-Administered Medications   Medication    [START ON 12/20/2023] cefTRIAXone  (ROCEPHIN ) IVP 1 g     Current Outpatient Medications   Medication Sig    acetaminophen  (TYLENOL  EXTRA STRENGTH) 500 mg tablet Take two tablets by mouth every 6 hours as needed for Pain. Max of 4,000 mg of acetaminophen  in 24 hours.  Indications: pain    bacitracin  (BACTERICIN) 500 unit/gram topical ointment Apply  topically to affected area twice daily.    blood sugar diagnostic (ONETOUCH ULTRA TEST) test strip Use to check blood sugars daily. E11.65    blood-glucose meter (ACCU-CHEK GUIDE GLUCOSE METER) kit Use one strip as directed daily. Diagnosis Code: DM-2    blood-glucose sensor (FREESTYLE LIBRE 3 PLUS SENSOR) sensor device Use to check blood sugars continuously. Replace every 15 days.    carvediloL  (COREG ) 6.25 mg tablet take 2 tablets BY MOUTH TWICE DAILY WITH MEALS    cholecalciferol (+) (VITAMIN D3) 2,000 unit tablet Take one tablet by mouth daily.    cyanocobalamin  (vitamin B-12) 3,000 mcg cap Take one capsule by mouth daily.    ezetimibe  (ZETIA )  10 mg tablet TAKE 1 TABLET BY MOUTH once daily    ferrous sulfate  (SLOW RELEASE IRON ) 142 mg (45 mg iron ) ER tablet Take one tablet by mouth daily.    furosemide  (LASIX ) 20 mg tablet Take one tablet by mouth daily as needed.    insulin  glargine (LANTUS  SOLOSTAR U-100 INSULIN ) 100 unit/mL (3 mL) subcutaneous PEN Inject twenty six Units to fifty Units under the skin at bedtime daily. Max 50 units/day    insulin  pen needles (disposable) (COMFORT EZ PEN NEEDLES) 31 gauge x 5/16 pen needle Use to inject insulin  1x/day    lancets 33 gauge 33 gauge Use to check blood sugars daily E11.65    Leg Brace misc Bilateral Fitted leg brace and right shoe for foot drop.  Dx: spina bifida.    levETIRAcetam  (KEPPRA ) 500 mg tablet Take one tablet by mouth twice daily.    loperamide  (IMODIUM  A-D) 2 mg capsule Take one capsule by mouth as Needed for Diarrhea.    losartan  (COZAAR ) 50 mg tablet Take 1 tablet by mouth daily    metFORMIN -XR (GLUCOPHAGE  XR) 500 mg extended release tablet TAKE 1 TABLET BY MOUTH IN THE MORNING AND 2 tablets IN THE EVENING with meals    Miscellaneous Medical Supply misc CPAP- auto PAP with heated humdification, 4-20, with mask of patient's choice.  Indications: severe sleep apnea    Miscellaneous Medical Supply misc Shoe lift for right foot.  Dx: spina bifida    multivit-min/folic/vit K/lycop (ONE-A-DAY MEN'S MULTIVITAMIN PO) Take 1 tablet by mouth at bedtime daily.    Ostomy Supplies 1  misc [employer] 1 piece Urostomy bag.  Use 1 bag as needed.    psyllium seed (with sugar) (FIBER PO) Take 1 tablet by mouth twice daily.    rosuvastatin  (CRESTOR ) 40 mg tablet TAKE 1 TABLET BY MOUTH at bedtime    sennosides-docusate sodium  (SENOKOT-S) 8.6/50 mg tablet Take one tablet by mouth twice daily. Indications: constipation    spironolactone  (ALDACTONE ) 25 mg tablet take 2 tablets BY MOUTH ONCE daily WITH FOOD    tirzepatide  (MOUNJARO ) 12.5 mg/0.5 mL injector PEN Inject 0.5 mL under the skin every 7 days.    triamcinolone  acetonide (TRIDERM ) 0.1 % topical cream Apply  topically to affected area twice daily.    VASCEPA  1 gram capsule TAKE TWO CAPSULES BY MOUTH TWICE DAILY WITH MEALS       Review of Systems   All other systems reviewed and are negative.      Physical Exam  Constitutional:       General: He is not in acute distress.     Appearance: Normal appearance. He is obese.   Cardiovascular:      Rate and Rhythm: Normal rate and regular rhythm.      Heart sounds: Normal heart sounds. Pulmonary:      Breath sounds: Normal breath sounds. No wheezing.   Abdominal:      General: Bowel sounds are normal. There is no distension.      Palpations: Abdomen is soft.      Tenderness: There is no abdominal tenderness. There is right CVA tenderness.      Comments: Ostomy appears healthy   Musculoskeletal:         General: Normal range of motion.      Right lower leg: No edema.      Left lower leg: No edema.   Skin:     General: Skin is warm.      Capillary Refill: Capillary refill  takes less than 2 seconds.   Neurological:      Mental Status: He is alert. Mental status is at baseline.       Vital Signs: Last Filed In 24 Hours Vital Signs: 24 Hour Range   BP: 103/50 (12/08 2220)  Temp: 36.9 ?C (98.4 ?F) (12/08 1653)  Pulse: 96 (12/08 2220)  Respirations: 16 PER MINUTE (12/08 2220)  SpO2: 97 % (12/08 2220)  O2 Device: Nasal cannula (12/08 2042)  O2 Liter Flow: 2 Lpm (12/08 2042) BP: (92-105)/(50-65)   Temp:  [36.9 ?C (98.4 ?F)]   Pulse:  [96-99]   Respirations:  [16 PER MINUTE-21 PER MINUTE]   SpO2:  [94 %-97 %]   O2 Device: Nasal cannula  O2 Liter Flow: 2 Lpm   Intensity Pain Scale (Self Report): 5 (12/19/23 1653)  Intensity Pain Scale (Self Report): 6 (12/19/23 1653)      Lab/Radiology/Other Diagnostic Tests:  24-hour labs:    Results for orders placed or performed during the hospital encounter of 12/19/23 (from the past 24 hours)   CBC AND DIFF    Collection Time: 12/19/23  6:11 PM   Result Value Ref Range    White Blood Cells 9.40 4.50 - 11.00 10*3/uL    Red Blood Cells 5.04 4.40 - 5.50 10*6/uL    Hemoglobin 14.4 13.5 - 16.5 g/dL    Hematocrit 58.2 59.9 - 50.0 %    MCV 82.7 80.0 - 100.0 fL    MCH 28.6 26.0 - 34.0 pg    MCHC 34.6 32.0 - 36.0 g/dL    RDW 86.4 88.9 - 84.9 %    Platelet Count 354 150 - 400 10*3/uL    MPV 8.1 7.0 - 11.0 fL    Neutrophils 60.0 41.0 - 77.0 %    Lymphocytes 24.7 24.0 - 44.0 %    Monocytes 10.1 4.0 - 12.0 %    Eosinophils 4.4 0.0 - 5.0 %    Basophils 0.8 0.0 - 2.0 %    Absolute Neutrophil Count 5.60 1.80 - 7.00 10*3/uL    Absolute Lymph Count 2.30 1.00 - 4.80 10*3/uL    Absolute Monocyte Count 0.90 (H) 0.00 - 0.80 10*3/uL    Absolute Eosinophil Count 0.40 0.00 - 0.45 10*3/uL    Absolute Basophil Count 0.10 0.00 - 0.20 10*3/uL    MDW (Monocyte Distribution Width) 22.9 (H) <=20.6   COMPREHENSIVE METABOLIC PANEL    Collection Time: 12/19/23  6:11 PM   Result Value Ref Range    Sodium 137 137 - 147 mmol/L    Potassium 4.8 3.5 - 5.1 mmol/L    Chloride 100 98 - 110 mmol/L    Glucose 107 (H) 70 - 100 mg/dL    Blood Urea Nitrogen 17 7 - 25 mg/dL    Creatinine 9.28 9.59 - 1.24 mg/dL    Calcium  9.3 8.5 - 10.6 mg/dL    Total Protein 7.6 6.0 - 8.0 g/dL    Total Bilirubin 0.3 0.2 - 1.3 mg/dL    Albumin  4.3 3.5 - 5.0 g/dL    Alk Phosphatase 96 25 - 110 U/L    AST 33 7 - 40 U/L    ALT 41 7 - 56 U/L    CO2 29 21 - 30 mmol/L    Anion Gap 8 3 - 12    Glomerular Filtration Rate (GFR) >60 >60 mL/min   URINALYSIS DIPSTICK REFLEX TO CULTURE    Collection Time: 12/19/23  6:15 PM    Specimen: Midstream; Urine  Result Value Ref Range    Color,UA Yellow     Turbidity,UA Clear Clear    Specific Gravity-Urine 1.021 1.005 - 1.030    pH,UA 7.0 5.0 - 8.0    Protein,UA 2+ (A) Negative    Glucose,UA Negative Negative    Ketones,UA Negative Negative    Bilirubin,UA Negative Negative    Blood,UA 2+ (A) Negative    Urobilinogen,UA Normal Normal    Nitrite,UA Positive (A) Negative    Leukocytes,UA Trace (A) Negative   URINALYSIS MICROSCOPIC REFLEX TO CULTURE    Collection Time: 12/19/23  6:15 PM    Specimen: Midstream; Urine   Result Value Ref Range    WBCs,UA 11 - 20 (A) None, 0 - 2  /HPF    RBCs,UA 20 - 50 (A) None, 0 - 2  /HPF    Mucous,UA Trace None, Trace /LPF    Bacteria,UA Few (A) None /HPF    Squamous Epithelial Cells 0 - 2 None, 0 - 2 , 2 - 5 /HPF   POC LACTATE    Collection Time: 12/19/23  8:24 PM   Result Value Ref Range    LACTIC ACID POC 0.7 0.5 - 2.0 mmol/L     Glucose: (!) 107 (12/19/23 1811)  Pertinent radiology reviewed.    Belynda DELENA Bean, MD  Please contact assigned team through [employer]

## 2023-12-20 LAB — URINALYSIS DIPSTICK REFLEX TO CULTURE
~~LOC~~ BKR GLUCOSE,UA: NEGATIVE g/dL — AB (ref 3.5–5.0)
~~LOC~~ BKR URINE BILE: NEGATIVE % (ref 4.0–12.0)
~~LOC~~ BKR URINE PH: 7 g/dL — AB (ref 5.0–8.0)
~~LOC~~ BKR URINE SPEC GRAVITY: 1 % — AB (ref 1.005–1.030)

## 2023-12-20 LAB — POC LACTATE: ~~LOC~~ BKR POC LACTIC ACID: 0.7 mmol/L (ref 0.5–2.0)

## 2023-12-20 LAB — COMPREHENSIVE METABOLIC PANEL
~~LOC~~ BKR ALBUMIN: 3.5 g/dL (ref 3.5–5.0)
~~LOC~~ BKR ALK PHOSPHATASE: 67 U/L (ref 25–110)
~~LOC~~ BKR ALT: 34 U/L — ABNORMAL HIGH (ref 7–56)
~~LOC~~ BKR ANION GAP: 8 10*3/uL (ref 3–12)
~~LOC~~ BKR AST: 25 U/L — ABNORMAL HIGH (ref 7–40)
~~LOC~~ BKR BLD UREA NITROGEN: 17 mg/dL (ref 7–25)
~~LOC~~ BKR CALCIUM: 8.7 mg/dL (ref 8.5–10.6)
~~LOC~~ BKR CO2: 28 mmol/L (ref 21–30)
~~LOC~~ BKR CREATININE: 0.8 mg/dL (ref 0.40–1.24)
~~LOC~~ BKR GLOMERULAR FILTRATION RATE (GFR): >60 mL/min (ref >60–4.80)
~~LOC~~ BKR POTASSIUM: 4.8 mmol/L (ref 3.5–5.1)
~~LOC~~ BKR TOTAL BILIRUBIN: 0.3 mg/dL (ref 0.2–1.3)
~~LOC~~ BKR TOTAL PROTEIN: 6.7 g/dL (ref 6.0–8.0)

## 2023-12-20 LAB — CBC AND DIFF
~~LOC~~ BKR ABSOLUTE BASO COUNT: 0.1 10*3/uL (ref 0.00–0.20)
~~LOC~~ BKR ABSOLUTE BASO COUNT: 0.1 10*3/uL (ref 0.00–0.20)
~~LOC~~ BKR ABSOLUTE EOS COUNT: 0.4 10*3/uL (ref 0.00–0.45)
~~LOC~~ BKR ABSOLUTE EOS COUNT: 0.5 10*3/uL — ABNORMAL HIGH (ref 0.00–0.45)
~~LOC~~ BKR ABSOLUTE LYMPH COUNT: 2.3 10*3/uL — AB (ref 1.00–4.80)
~~LOC~~ BKR ABSOLUTE MONO COUNT: 0.9 10*3/uL — ABNORMAL HIGH (ref 0.00–0.80)
~~LOC~~ BKR ABSOLUTE MONO COUNT: 1 10*3/uL — ABNORMAL HIGH (ref 0.00–0.80)
~~LOC~~ BKR ABSOLUTE NEUTROPHIL: 5.6 10*3/uL — AB (ref 1.80–7.00)
~~LOC~~ BKR HEMATOCRIT: 41 % (ref 40.0–50.0)
~~LOC~~ BKR MCH: 28 pg (ref 26.0–34.0)
~~LOC~~ BKR MCV: 82 fL — ABNORMAL HIGH (ref 80.0–100.0)
~~LOC~~ BKR MCV: 83 fL (ref 80.0–100.0)
~~LOC~~ BKR MDW (MONOCYTE DISTRIBUTION WIDTH): 22 — ABNORMAL HIGH (ref <=20.6)
~~LOC~~ BKR MDW (MONOCYTE DISTRIBUTION WIDTH): 27 — ABNORMAL HIGH (ref <=20.6)
~~LOC~~ BKR RBC COUNT: 5 10*6/uL (ref 4.40–5.50)
~~LOC~~ BKR RBC COUNT: 4.4 10*6/uL (ref 4.40–5.50)
~~LOC~~ BKR WBC COUNT: 9.4 10*3/uL (ref 4.50–11.00)
~~LOC~~ BKR WBC COUNT: 8.2 10*3/uL (ref 4.50–11.00)

## 2023-12-20 LAB — MAGNESIUM: ~~LOC~~ BKR MAGNESIUM: 1.7 mg/dL — ABNORMAL LOW (ref 1.6–2.6)

## 2023-12-20 LAB — POC GLUCOSE
~~LOC~~ BKR POC GLUCOSE: 104 mg/dL — ABNORMAL HIGH (ref 70–100)
~~LOC~~ BKR POC GLUCOSE: 170 mg/dL — ABNORMAL HIGH (ref 70–100)
~~LOC~~ BKR POC GLUCOSE: 147 mg/dL — ABNORMAL HIGH (ref 70–100)

## 2023-12-20 LAB — URINALYSIS MICROSCOPIC REFLEX TO CULTURE: ~~LOC~~ BKR SQUAMOUS EPI CELLS: 0 /HPF (ref 25–110)

## 2023-12-20 LAB — PHOSPHORUS: ~~LOC~~ BKR PHOSPHORUS: 4.6 mg/dL — ABNORMAL HIGH (ref 2.0–4.5)

## 2023-12-20 MED ORDER — PSYLLIUM HUSK 3 GRAM PO PWPK
1 | Freq: Two times a day (BID) | ORAL | 0 refills | Status: DC
Start: 2023-12-20 — End: 2023-12-21
  Administered 2023-12-20 – 2023-12-21 (×3): 1 via ORAL

## 2023-12-20 NOTE — Progress Notes [1]
 [facility] END OF SHIFT NOTE    Patient Interventions and Education  Fall Risk/JHFRAT Interventions and Education: (Charting when applicable)  Elimination Interventions : Commode at bedside and Communicate timing of laxatives/diuretics with assistive personnel to support proactive elimination needs.   Medications : Educate patient on medication side effects  Patient Care Equipment: Needs assistance with patient care equipment when ambulating, Ensure environment is free of clutter and walkways are clear from tripping hazards, and Assess need for patient equipment and remove if not in use  Mobility: Assist x1, Elimination equipment at bedside (urinal or commode) , and Ensure the use of corrective lens and/or hearing aides are in place prior to ambulation   Cognition: N/A  Risk for Moderate/Major Injury: N/A

## 2023-12-20 NOTE — Case Mgmt DC Plan [600024]
 Case Management Admission Assessment    NAME:Allen Gill                          MRN: [id number]             DOB:Aug 15, 1990          AGE: 33 y.o.  ADMISSION DATE: 12/19/2023             DAYS ADMITTED: LOS: 0 days      Today?s Date: 12/20/2023    Source of Information: EMR and patient     Plan     This CM met with pt for assessment on this date.  Provided contact information and explanation of SW/NCM roles.  Reviewed [facility] and Preparing for Discharge hand-outs.  Provided opportunity for questions and discussion. Pt/family encouraged to contact Case Management team with questions and concerns during hospitalization and until patient is able to transition back to the patient's primary care physician.    Assessment Notes:    Pt resides in ground level apartment-level alone with zero steps to entry.    Pt lives alone.    Pt is dependent with the following ADLs: transportation, medication management, household chores and meal prep.    Pt is disabled.    Pt utilizes bilat leg braces, glucometer, quad cane, shower chair, walker and illeal conduit supplies provided by [employer], BP monitor DME.     Pt's previous history of [facility], and would return to [facility] and [facility].    PCP: Current with Dr. Harlene Dustman.    DC transportation will be provided by: brother Ludie [phone].    Case Management Needs:    No NCM needs identified.   Pt anticipated to discharge home on oral antibiotics.   Will continue to follow.           Patient Address/Phone  [address] [place]  [address]  [phone] (home)     Emergency Contact  Extended Emergency Contact Information  Primary Emergency Contact: Kretzschmar,Ann  Address: [address] [place], [place] [address] [place]   Home Phone: [phone]  Mobile Phone: [phone]  Relation: Mother  Preferred language: ENGLISH  Interpreter needed? No  Secondary Emergency Contact: Comyers,Ed  Home Phone: [phone]  Mobile Phone: [phone]  Relation: Relative  Preferred language: ENGLISH  Interpreter needed? No    Forensic Scientist: Yes, patient has a healthcare directive  Type of Healthcare Directive: Durable power of attorney for healthcare  Location of Healthcare Directive: Patient does not have it with him/her  Would patient like to fill out a (a new) Healthcare Directive?: No, patient declined  Psych Advance Directive (Psych unit only): No, patient does not have a Social Research Officer, Government  Does the Patient Need Case Management to Arrange Discharge Transport? (ex: facility, ambulance, wheelchair/stretcher, Medicaid, cab, other): No  Will the Patient Use Family Transport?: Yes  Transportation Name, Phone and Availability #1: Brother Ludie [phone]    Expected Discharge Date  12/21/2023     Living Situation Prior to Admission  Living Arrangements  Type of Residence: Home, dependent on others  Living Arrangements: Alone  Financial Risk Analyst / Tub: Tub Only  How many levels in the residence?: 1  Can patient live on one level if needed?: Yes  Does residence have entry and/or inside stairs?:  No  Assistance needed prior to admit or anticipated on discharge: Yes  Who provides assistance or could if needed?: Caregiver Glory Shan [phone])  Are they in good health?: Yes  Can support system provide 24/7 care if needed?: Maybe  Level of Function   Prior level of function: Needs assist with ADLs  Which ADLs require assistance?: Transportation, medication management, meal prep, household chores  Who assists with ADLs?: Caregiver Glory and family  Cognitive Abilities   Cognitive Abilities: Scientist, Research (medical)  Coverage  Primary Insurance: Medicare (A B)  Secondary Insurance: Medicaid  Medicaid State: [place]  ([facility]  Additional Coverage: None  Medication Coverage    Medication Coverage: No insurance  Have you experienced a noticeable increase in your copay costs recently?: No  Are current medications affordable?: Yes  Do You Use a Co-Pay Card or a Medication Assistance Program to Help Manage Medication Costs?: No  Do You Manage Your Own Medications?: No  Who is Responsible for Ordering and Setting up Medications?: Caregiver Materials Engineer of Income   Source Of Income: SSDI  Financial Assistance Needed?      Psychosocial Needs  Mental Health  Mental Health History: No  Substance Use History  Substance Use History Screen: No  Other      Current/Previous Services  PCP  Bethena Harlene LABOR, [phone], [phone]  Pharmacy    [id number] - [place], [place] - [address]  [address]  [place] [address]  Phone: [phone] Fax: [phone]    [facility]  [address]  [place]  [place] [address]  Phone: [phone] Fax: [phone]    [facility]  [address] [place] [address]  Phone: [phone] Fax: [phone]    Durable Medical Equipment   Durable Medical Equipment at home: Carlis Finder, Shower Chair, [place], Grab bars, Other (comment) (leg braces, glucometer, illeal conduit)  Home Health  Receiving home health: In the past  Agency name: [facility]  Would patient use this agency again?: Yes  Hemodialysis or Peritoneal Dialysis  Undergoing hemodialysis or peritoneal dialysis: No  Tube/Enteral Feeds  Receive tube/enteral feeds: In the past (As a child)  Infusion  Receive infusions: In the past  Infusion company: [facility]  Would patient use this agency again?: Yes  Private Duty  Private duty help used: No  Home and Community Based Services  Home and community based services: Yes  HCBS assistance hours/week: 20hrs/week  Provider Schedule: 4 hrs/day/varies  Services provided: transportation, meal prep, household chores, medication management  Ryan White  Ryan White: N/A  Hospice  Hospice: No  Outpatient Therapy  PT: No  OT: No  SLP: No  Skilled Nursing Facility/Nursing Home  SNF: No  NH: No  Inpatient Rehab  IPR: In the past  Name of Facility: [facility]  Would patient return for future services?: Yes  [facility]  LTACH: No  Acute Hospital Stay  Acute Hospital Stay: In the past  Was patient's stay within the last 30 days?: No      Randall Her BSN, Museum/gallery Conservator Case Manager  [phone]  [place]

## 2023-12-20 NOTE — Progress Notes [1]
 General Progress Note    Name: Allen Gill     Unijb'd Date:  12/20/2023  Admission Date: 12/19/2023  LOS: 0 days    Assessment/Plan   Principal Problem:    UTI (urinary tract infection)  Active Problems:    Spina bifida (CMS-HCC)    Arnold-Chiari malformation (CMS-HCC)    S/P VP shunt    Morbid obesity (CMS-HCC)    Seizure disorder (CMS-HCC)    Essential hypertension    Non-ischemic cardiomyopathy (CMS-HCC)    S/P ileal conduit (CMS-HCC)    Neurogenic bladder    GERD (gastroesophageal reflux disease)    Type 2 diabetes mellitus with hyperglycemia (CMS-HCC)    Elevated triglycerides with high cholesterol    Allen Gill is a 33 y.o. 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 Urology consulted.     UTI  Neurogenic bladder s/p ileal conduit and bilateral ureteral reattachment   Right Flank Pain   Pyelonephritis  -WBC 8.2, no reports of fevers, remained afebrile   -UA shows bacteria, leukocytes, RBC, and nitrites  -CT A/P shows right ureteral fat stranding and mild right sided hydroureteronephrosis  -Lactic acid 0.7  -s/p 1L LR in ED   PLAN  -Follow up urine and blood cultures  -Continue Ceftriaxone   -Discussed with Urology continuing antibiotics to treat pyelonephritis and this is likely the cause of hydronephrosis on CT  -PRN tylenol  and oxy for pain; however would encourage tylenol  use first  -Pt declined need for lidocaine  patch     Microcytic anemia  -hgb 14.4 on admission, down to 12.9 on 12/9  -reported bleeding from ileal conduit, also could be a component of dilution given fluid administration  -pt denies any further bleeding from urostomy since 12/8 in the afternoon   -repeat H/H this afternoon, monitor closely for bleeding     HF with recovered EF  HTN/HLD  -Last echo at [facility] in Oct 2023 with LVEF 50%, no segmental wall motion abnormalities which demonstrated no changes from echo in 2021  -Continue PTA carvedilol  and losartan , crestor , zetia    -Hold PRN lasix  with infection and monitor daily for need to resume   -Appears euvolemic on exam     T2DM  -A1c 12/14/23: 7.7  -Continue PTA Lantus  26U daily (confirmed dose with patient on 12/9), PTA Mounjaro  last dose Tuesday 12/2, due for it today  -Hold metformin  while inpatient as well as Mounjaro  (resume at dc)  -LDCF, hypoglycemia protocol  -ACHS blood sugars      Chiari malformation/hydrocephalus s/p VP shunt placement  -Shunt series on admission with Right frontal ventriculoperitoneal shunt catheters are in place. Shunt setting unchanged since 07/06/2020.Visualized shunt components over the skull, neck, chest, abdomen appear   intact without kinking or disruption. The left-sided shunt terminates over   the left upper quadrant, and the right-sided shunt terminates in the   pelvis. No pulmonary consolidation or pleural effusion. Cardiac silhouette and pulmonary vasculature are within normal limits. The bowel gas pattern is not obstructed.   -CT head on admission with  No acute intracranial hemorrhage or mass effect. Unchanged indwelling bilateral frontal approach VP shunt catheters   with stable nondistended slitlike orientation of the ventricles. Stable findings of Chiari II malformation with changes of   craniocervical decompression.   -  Continue to monitor.     FEN:  -No IVF   -Regular diet  -Daily AM labs, replace electrolyte as needed    DVT ppx:  -Hold due to bleeding reported in urostomy    Code: Full code    Disp: continue inpatient admission pending urine cultures and urology consult. Likely back home 12/10 with oral antibiotics pending cultures.     Patient discussed with Dr. Merlynn    Total time spent was greater than?50??minutes in patient care today with greater than 50% spent reviewing the chart/records and coordinating care. Remainder of the time was spent examining the patient, discussing the care plan, and answering the patient's questions in the patient's room.   Complexity of medical decision making is high because of the multi-system nature of disease process.?    Allen Batter, APRN-NP  Pager [phone]  Voalte @ Allen Gill [facility]    Subjective:     No overnight events. Patient reports right sided flank pain that started around Sunday 12/7 at 0430. He noted his urostomy had dark red blood in it with no clots. He reports he changed his ostomy and drank a lot of water  and reports no further blood after that. He also reports a headache that started yesterday around 330 pm which he reports has resolved after being given Tylenol . He reports flank pain is much improved and is not requiring much medication for pain currently. Last UTI was in 2024.    Denies fever, chill, chest pain, shortness of breath, abdominal pain, nausea, vomiting. Denies diarrhea, reports some continued flank pain but much improved. Urostomy is having good output, no further bleeding.    No family at bedside, patient updated on plan of care.     Objective:     Allergies: Latex, Amoxicillin , Ceclor [cefaclor], Clindamycin, and Zosyn  [piperacillin -tazobactam]    Medications:  Scheduled Meds:carvediloL  (COREG ) tablet 12.5 mg, 12.5 mg, Oral, BID  cefTRIAXone  (ROCEPHIN ) IVP 1 g, 1 g, Intravenous, Q24H*  CHOLEcalciferoL  (vitamin D3) tablet 2,000 Units, 2,000 Units, Oral, QDAY  cyanocobalamin  (vitamin B-12) tablet 1,000 mcg, 1,000 mcg, Oral, QDAY  ezetimibe  (ZETIA ) tablet 10 mg, 10 mg, Oral, QDAY  insulin  glargine (LANTUS  SOLOSTAR U-100 INSULIN ) injection PEN 26 Units, 26 Units, Subcutaneous, QDAY  levETIRAcetam  (KEPPRA ) tablet 500 mg, 500 mg, Oral, BID  losartan  (COZAAR ) tablet 50 mg, 50 mg, Oral, QDAY  psyllium husk packet 1 packet, 1 packet, Oral, BID  rosuvastatin  (CRESTOR ) tablet 40 mg, 40 mg, Oral, QHS  spironolactone  (ALDACTONE ) tablet 50 mg, 50 mg, Oral, QDAY    Continuous Infusions:  PRN and Respiratory Meds:acetaminophen  Q6H PRN, loperamide  PRN, melatonin QHS PRN, ondansetron  Q6H PRN **OR** ondansetron  Q6H PRN, oxyCODONE  Q4H PRN, polyethylene glycol 3350  QDAY PRN    Physical Exam:    Vital Signs: Last Filed In 24 Hours Vital Signs: 24 Hour Range   BP: 114/76 (12/09 0731)  Temp: 36.5 ?C (97.7 ?F) (12/09 9268)  Pulse: 91 (12/09 0731)  Respirations: 16 PER MINUTE (12/09 0731)  SpO2: 95 % (12/09 0731)  O2 Device: None (Room air) (12/09 0731)  O2 Liter Flow: 2 Lpm (12/08 2042) BP: (83-118)/(50-76)   Temp:  [36.5 ?C (97.7 ?F)-36.9 ?C (98.4 ?F)]   Pulse:  [76-99]   Respirations:  [14 PER MINUTE-21 PER MINUTE]   SpO2:  [94 %-99 %]   O2 Device: None (Room air)  O2 Liter Flow: 2 Lpm   Intensity Pain Scale (Self Report): 2 (12/20/23 9360) Vitals:    12/19/23 1653   Weight: 119.7  kg (264 lb)         Constitutional: Alert, no acute distress. Answers questions appropriately.   Ears, eyes, nose, mouth, and throat: Normal conjunctivae. PERRL, moist mucous membranes.   Chest: Symmetric.    Cardiovascular: Regular rhythm and rate. Normal S1 and S2. No murmurs. Normal symmetrical pulses.   Respiratory: Non-labored, breathing comfortable without use of accessory muscles. Breath sounds equal bilaterally. No crackles, wheezes, or rhonchi.   Gastrointestinal: Soft, non-distended, non-tender to palpation. Normal bowel sounds.  Genitourinary: Urostomy noted, with dark yellow urine, area clean and dry. Stoma red.   Musculoskeletal: Not edema. Active ROM of all extremities.   Skin: Warm,dry. No rashes or lesions noted.   Neuro: Oriented x4, no focal deficits, sensation intact. Moving all limbs spontaneously throughout my exam.  Psych: Calm, cooperative, pleasant.     Intake/Output Summary:  (Last 24 hours)    Intake/Output Summary (Last 24 hours) at 12/20/2023 0809  Last data filed at 12/19/2023 2225  Gross per 24 hour   Intake 1000 ml   Output 400 ml   Net 600 ml         Lab/Radiology/Other Diagnostic Tests:  24-hour labs:    Results for orders placed or performed during the hospital encounter of 12/19/23 (from the past 24 hours)   CBC AND DIFF    Collection Time: 12/19/23  6:11 PM   Result Value Ref Range    White Blood Cells 9.40 4.50 - 11.00 10*3/uL    Red Blood Cells 5.04 4.40 - 5.50 10*6/uL    Hemoglobin 14.4 13.5 - 16.5 g/dL    Hematocrit 58.2 59.9 - 50.0 %    MCV 82.7 80.0 - 100.0 fL    MCH 28.6 26.0 - 34.0 pg    MCHC 34.6 32.0 - 36.0 g/dL    RDW 86.4 88.9 - 84.9 %    Platelet Count 354 150 - 400 10*3/uL    MPV 8.1 7.0 - 11.0 fL    Neutrophils 60.0 41.0 - 77.0 %    Lymphocytes 24.7 24.0 - 44.0 %    Monocytes 10.1 4.0 - 12.0 %    Eosinophils 4.4 0.0 - 5.0 %    Basophils 0.8 0.0 - 2.0 %    Absolute Neutrophil Count 5.60 1.80 - 7.00 10*3/uL    Absolute Lymph Count 2.30 1.00 - 4.80 10*3/uL    Absolute Monocyte Count 0.90 (H) 0.00 - 0.80 10*3/uL    Absolute Eosinophil Count 0.40 0.00 - 0.45 10*3/uL    Absolute Basophil Count 0.10 0.00 - 0.20 10*3/uL    MDW (Monocyte Distribution Width) 22.9 (H) <=20.6   COMPREHENSIVE METABOLIC PANEL    Collection Time: 12/19/23  6:11 PM   Result Value Ref Range    Sodium 137 137 - 147 mmol/L    Potassium 4.8 3.5 - 5.1 mmol/L    Chloride 100 98 - 110 mmol/L    Glucose 107 (H) 70 - 100 mg/dL    Blood Urea Nitrogen 17 7 - 25 mg/dL    Creatinine 9.28 9.59 - 1.24 mg/dL    Calcium  9.3 8.5 - 10.6 mg/dL    Total Protein 7.6 6.0 - 8.0 g/dL    Total Bilirubin 0.3 0.2 - 1.3 mg/dL    Albumin  4.3 3.5 - 5.0 g/dL    Alk Phosphatase 96 25 - 110 U/L    AST 33 7 - 40 U/L    ALT 41 7 - 56 U/L    CO2 29 21 - 30 mmol/L  Anion Gap 8 3 - 12    Glomerular Filtration Rate (GFR) >60 >60 mL/min   URINALYSIS DIPSTICK REFLEX TO CULTURE    Collection Time: 12/19/23  6:15 PM    Specimen: Midstream; Urine   Result Value Ref Range    Color,UA Yellow     Turbidity,UA Clear Clear    Specific Gravity-Urine 1.021 1.005 - 1.030    pH,UA 7.0 5.0 - 8.0    Protein,UA 2+ (A) Negative    Glucose,UA Negative Negative    Ketones,UA Negative Negative    Bilirubin,UA Negative Negative    Blood,UA 2+ (A) Negative    Urobilinogen,UA Normal Normal    Nitrite,UA Positive (A) Negative    Leukocytes,UA Trace (A) Negative   URINALYSIS MICROSCOPIC REFLEX TO CULTURE    Collection Time: 12/19/23  6:15 PM    Specimen: Midstream; Urine   Result Value Ref Range    WBCs,UA 11 - 20 (A) None, 0 - 2  /HPF    RBCs,UA 20 - 50 (A) None, 0 - 2  /HPF    Mucous,UA Trace None, Trace /LPF    Bacteria,UA Few (A) None /HPF    Squamous Epithelial Cells 0 - 2 None, 0 - 2 , 2 - 5 /HPF   POC LACTATE    Collection Time: 12/19/23  8:24 PM   Result Value Ref Range    LACTIC ACID POC 0.7 0.5 - 2.0 mmol/L   CBC AND DIFF    Collection Time: 12/20/23  3:16 AM   Result Value Ref Range    White Blood Cells 8.20 4.50 - 11.00 10*3/uL    Red Blood Cells 4.49 4.40 - 5.50 10*6/uL    Hemoglobin 12.9 (L) 13.5 - 16.5 g/dL    Hematocrit 62.4 (L) 40.0 - 50.0 %    MCV 83.7 80.0 - 100.0 fL    MCH 28.8 26.0 - 34.0 pg    MCHC 34.4 32.0 - 36.0 g/dL    RDW 86.6 88.9 - 84.9 %    Platelet Count 276 150 - 400 10*3/uL    MPV 7.7 7.0 - 11.0 fL    Neutrophils 51.4 41.0 - 77.0 %    Lymphocytes 30.4 24.0 - 44.0 %    Monocytes 12.1 (H) 4.0 - 12.0 %    Eosinophils 5.5 (H) 0.0 - 5.0 %    Basophils 0.6 0.0 - 2.0 %    Absolute Neutrophil Count 4.20 1.80 - 7.00 10*3/uL    Absolute Lymph Count 2.50 1.00 - 4.80 10*3/uL    Absolute Monocyte Count 1.00 (H) 0.00 - 0.80 10*3/uL    Absolute Eosinophil Count 0.50 (H) 0.00 - 0.45 10*3/uL    Absolute Basophil Count 0.10 0.00 - 0.20 10*3/uL    MDW (Monocyte Distribution Width) 27.9 (H) <=20.6   COMPREHENSIVE METABOLIC PANEL    Collection Time: 12/20/23  3:16 AM   Result Value Ref Range    Sodium 139 137 - 147 mmol/L    Potassium 3.9 3.5 - 5.1 mmol/L    Chloride 103 98 - 110 mmol/L    Glucose 94 70 - 100 mg/dL    Blood Urea Nitrogen 17 7 - 25 mg/dL    Creatinine 9.19 9.59 - 1.24 mg/dL    Calcium  8.7 8.5 - 10.6 mg/dL    Total Protein 6.7 6.0 - 8.0 g/dL Total Bilirubin 0.3 0.2 - 1.3 mg/dL    Albumin  3.5 3.5 - 5.0 g/dL    Alk Phosphatase 67 25 - 110 U/L  AST 25 7 - 40 U/L    ALT 34 7 - 56 U/L    CO2 28 21 - 30 mmol/L    Anion Gap 8 3 - 12    Glomerular Filtration Rate (GFR) >60 >60 mL/min   MAGNESIUM     Collection Time: 12/20/23  3:16 AM   Result Value Ref Range    Magnesium  1.7 1.6 - 2.6 mg/dL   PHOSPHORUS    Collection Time: 12/20/23  3:16 AM   Result Value Ref Range    Phosphorus 4.6 (H) 2.0 - 4.5 mg/dL     Glucose: 94 (87/90/74 0316)  Pertinent radiology reviewed.                                       Allen Batter, APRN-NP  Pager [phone]  [facility] @ [facility]

## 2023-12-20 NOTE — Progress Notes [1]
 [facility] END OF SHIFT NOTE    Patient Interventions and Education  Fall Risk/JHFRAT Interventions and Education: (Charting when applicable)  Elimination Interventions : Communicate timing of laxatives/diuretics with assistive personnel to support proactive elimination needs.   Medications : Bedside commode (i.e., urgency, frequency, dizziness) , Educate patient on medication side effects, Consult the provider to decrease polypharmacy and unused medications , and Consult pharmacy for medication changes to decrease polypharmacy or high-risk medication use   Patient Care Equipment: Does not need assistance with patient care equipment when ambulating  Mobility: Assist x1  Cognition: N/A  Risk for Moderate/Major Injury: Risk for fracture

## 2023-12-20 NOTE — Care Coordination-Inpatient [600030]
 Med Private Night 8 - [phone] [place] (available on Voalte) will take calls on this patient until 8 am on 12/20/2023. Afterwards, please contact Med-Private S team for any questions or concerns. Voalte is the preferred way of communication.     Deatrice Cellar, M.D.  Night AOD  On Voalte ---- 8 pm to 8 am

## 2023-12-21 ENCOUNTER — Encounter: Admit: 2023-12-21 | Discharge: 2023-12-21 | Payer: MEDICARE

## 2023-12-21 ENCOUNTER — Inpatient Hospital Stay
Admission: EM | Admit: 2023-12-19 | Discharge: 2023-12-19 | Disposition: A | Discharge status: Home / Self Care | Payer: MEDICARE

## 2023-12-21 VITALS — BP 141/67 | HR 97 | Temp 98.20000°F | Wt 259.0 lb

## 2023-12-21 DIAGNOSIS — Z936 Other artificial openings of urinary tract status: Secondary | ICD-10-CM

## 2023-12-21 DIAGNOSIS — I428 Other cardiomyopathies: Secondary | ICD-10-CM

## 2023-12-21 DIAGNOSIS — Z982 Presence of cerebrospinal fluid drainage device: Secondary | ICD-10-CM

## 2023-12-21 DIAGNOSIS — Z8249 Family history of ischemic heart disease and other diseases of the circulatory system: Secondary | ICD-10-CM

## 2023-12-21 DIAGNOSIS — N319 Neuromuscular dysfunction of bladder, unspecified: Secondary | ICD-10-CM

## 2023-12-21 DIAGNOSIS — G40909 Epilepsy, unspecified, not intractable, without status epilepticus: Secondary | ICD-10-CM

## 2023-12-21 DIAGNOSIS — N136 Pyonephrosis: Secondary | ICD-10-CM

## 2023-12-21 DIAGNOSIS — E7800 Pure hypercholesterolemia, unspecified: Secondary | ICD-10-CM

## 2023-12-21 DIAGNOSIS — I110 Hypertensive heart disease with heart failure: Secondary | ICD-10-CM

## 2023-12-21 DIAGNOSIS — Z833 Family history of diabetes mellitus: Secondary | ICD-10-CM

## 2023-12-21 DIAGNOSIS — E781 Pure hyperglyceridemia: Secondary | ICD-10-CM

## 2023-12-21 DIAGNOSIS — K219 Gastro-esophageal reflux disease without esophagitis: Secondary | ICD-10-CM

## 2023-12-21 DIAGNOSIS — Q0703 Arnold-Chiari syndrome with spina bifida and hydrocephalus: Secondary | ICD-10-CM

## 2023-12-21 DIAGNOSIS — Z794 Long term (current) use of insulin: Secondary | ICD-10-CM

## 2023-12-21 DIAGNOSIS — Z87442 Personal history of urinary calculi: Secondary | ICD-10-CM

## 2023-12-21 DIAGNOSIS — I5032 Chronic diastolic (congestive) heart failure: Secondary | ICD-10-CM

## 2023-12-21 DIAGNOSIS — Z83438 Family history of other disorder of lipoprotein metabolism and other lipidemia: Secondary | ICD-10-CM

## 2023-12-21 DIAGNOSIS — D509 Iron deficiency anemia, unspecified: Secondary | ICD-10-CM

## 2023-12-21 LAB — CULTURE-URINE W/SENSITIVITY

## 2023-12-21 LAB — POC GLUCOSE: ~~LOC~~ BKR POC GLUCOSE: 135 mg/dL — ABNORMAL HIGH (ref 70–100)

## 2023-12-21 MED ORDER — INSULIN GLARGINE-YFGN 100 UNIT/ML (3 ML) SC INPN
26 [IU] | Freq: Every morning | SUBCUTANEOUS | 3 refills | 68.00000 days | Status: DC
Start: 2023-12-21 — End: 2023-12-21

## 2023-12-21 MED ORDER — INSULIN GLARGINE-YFGN 100 UNIT/ML (3 ML) SC INPN
26 [IU] | Freq: Every morning | SUBCUTANEOUS | 3 refills | 68.00000 days | Status: AC
Start: 2023-12-21 — End: ?

## 2023-12-21 MED ORDER — CEFPODOXIME 200 MG PO TAB
200 mg | ORAL_TABLET | Freq: Two times a day (BID) | ORAL | 0 refills | 7.00000 days | Status: AC
Start: 2023-12-21 — End: ?
  Filled 2023-12-21: qty 16, 8d supply, fill #0

## 2023-12-21 NOTE — Progress Notes [1]
 Went over discharge information/instructions, medication instructions/information, and where to pick up medications with pt. PIV removed. Pt belongings packed and sent safely with pt. Pt taken in wheelchair off unit at 1030 on 12/21/2023.

## 2023-12-21 NOTE — Discharge Instr - Appointments [65]
 Please call your PCP to schedule follow up within 7-10 days of discharge.

## 2023-12-21 NOTE — Discharge Summary [5]
 Discharge Summary      Name: Allen Gill  Medical Record Number: [id number]        Account Number:  [id number]  Date Of Birth:  11/24/1990                         Age:  33 y.o.  Admit date:  12/19/2023                     Discharge date: 12/21/2023      Discharge Attending:  Dr. Medford Rung, DO  Discharge Summary Completed By: Joshua FORBES Batter, APRN-NP    Service: Med Private S-NP [phone]    Reason for hospitalization:  UTI (urinary tract infection) [N39.0]    Primary Discharge Diagnosis:   UTI (urinary tract infection)    Hospital Diagnoses:  Hospital Problems        Active Problems    * (Principal) UTI (urinary tract infection)    Spina bifida (CMS-HCC)    Arnold-Chiari malformation (CMS-HCC)    S/P VP shunt    Morbid obesity (CMS-HCC)    Seizure disorder (CMS-HCC)    Essential hypertension    Non-ischemic cardiomyopathy (CMS-HCC)    S/P ileal conduit (CMS-HCC)    Neurogenic bladder    GERD (gastroesophageal reflux disease)    Type 2 diabetes mellitus with hyperglycemia (CMS-HCC)    Elevated triglycerides with high cholesterol     Present on Admission:   UTI (urinary tract infection)   Spina bifida (CMS-HCC)   Arnold-Chiari malformation (CMS-HCC)   S/P VP shunt   Morbid obesity (CMS-HCC)   Seizure disorder (CMS-HCC)   Essential hypertension   Non-ischemic cardiomyopathy (CMS-HCC)   S/P ileal conduit (CMS-HCC)   Neurogenic bladder   GERD (gastroesophageal reflux disease)   Type 2 diabetes mellitus with hyperglycemia (CMS-HCC)   Elevated triglycerides with high cholesterol    Significant Past Medical History        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  Allergy  Arnold-Chiari malformation (CMS-HCC)  Cardiomyopathy (CMS-HCC)  Diabetes mellitus (CMS-HCC)  Headache(784.0)  Hydrocephalus (CMS-HCC)  Hypertension  Infection of VP shunt  Kidney stones      Comment:  frequent  Lung disease  Myelomeningocele (CMS-HCC)  Neurogenic bladder  OSA (obstructive sleep apnea)  Preop cardiovascular exam  Seizures (CMS-HCC)      Comment:  blank staring spells in childhood - last absentee                seizure 2012  Shunt malfunction  Spina bifida (CMS-HCC)  Tachycardia  Urinary retention      Comment:  Added automatically from request for surgery [phone]  Urinary tract infection      Comment:  frequent    Allergies   Latex, Amoxicillin , Ceclor [cefaclor], Clindamycin, and Zosyn  [piperacillin -tazobactam]    Brief Hospital Course     Allen Gill is a 33 year old male with a history of spina bifida with neurogenic bladder s/p ileal conduit and bilateral ureteral reimplantation, 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    [facility] Course    Urinary Tract Infection / Pyelonephritis / Hematuria  He presented with right flank pain and frank hematuria in  his urostomy bag, which resolved after increased oral hydration and ostomy change. He denied fever, chills, or systemic symptoms. Urinalysis was notable for bacteria, leukocytes, RBCs, and nitrites. CT abdomen/pelvis demonstrated right ureteral fat stranding and mild right-sided hydroureteronephrosis. Blood and urine cultures were obtained. He was started on ceftriaxone , which was continued during hospitalization. Urology was consulted and recommended continuing antibiotics for presumed pyelonephritis, likely the cause of the hydronephrosis. He remained afebrile and hemodynamically stable. No further hematuria was reported after admission. Multimodal pain control was provided, with improvement in flank pain and minimal need for analgesics. Urine cultures grew over three organisms which is likely a contaminate.     Neurogenic Bladder / S/P Ileal Conduit and Bilateral Ureteral Reimplantation  He has a history of neurogenic bladder secondary to spina bifida, with prior ileal conduit and bilateral ureteral reimplantation. The current admission was complicated by UTI and right-sided hydroureteronephrosis. Urostomy output was adequate, and the stoma appeared healthy throughout hospitalization. No further bleeding was observed after the initial episode on 12/19/2023.    Spina Bifida / Arnold-Chiari Malformation / Hydrocephalus S/P VP Shunt  He has a history of spina bifida and Arnold-Chiari malformation with hydrocephalus, status post multiple VP shunt placements and revisions. On admission, shunt series and CT head were unremarkable, with no evidence of shunt malfunction or acute intracranial process. He reported a headache on admission that resolved with acetaminophen . Neurological exam remained at baseline throughout hospitalization.    Heart Failure with Recovered Ejection Fraction / Non-Ischemic Cardiomyopathy  He has a history of non-ischemic cardiomyopathy with recovered EF (last echo in October 2023 with LVEF 50%). He was continued on carvedilol  and losartan . PRN furosemide  was held during the infection and he appeared euvolemic on exam.    Type 2 Diabetes Mellitus  He has type 2 diabetes with a recent A1c of 7.7% (12/14/2023). Insulin  glargine was continued at home dose. Metformin  and tirzepatide  were held during hospitalization, with plans to resume at discharge. Hypoglycemia protocol and blood glucose monitoring were implemented.    Dyslipidemia  He was continued on rosuvastatin  and ezetimibe  during hospitalization.    Seizure Disorder  He has a history of seizure disorder and was continued on levetiracetam  during hospitalization. No seizures were reported.    Essential Hypertension  He was continued on losartan  and carvedilol  during hospitalization. Blood pressure remained stable.    Morbid Obesity  Morbid obesity was noted on exam (weight 119.7 kg). No acute issues related to obesity during this admission.    Other Chronic Conditions  He has a history of GERD, which was not a focus of this admission. No acute issues related to GERD were reported.    Anemia  Hemoglobin decreased from 14.4 on admission to 12.9 on 12/20/2023, likely multifactorial due to bleeding from the ileal conduit and possible dilution from IV fluids. No further bleeding was reported, and hemoglobin was monitored closely and remained stable.    Disposition   He remained hemodynamically stable and afebrile, with resolution of hematuria and improvement in flank pain at the time of discharge planning. Given clinical stability, urine culture was contaminated. He was discharged on oral Vantin  for 8 more days for a total of 10 days of antibiotic therapy.     Day of discharge exam notable for:   Constitutional: Alert, no acute distress. Answers questions appropriately. Sitting up in the chair.   Ears, eyes, nose, mouth, and throat: Normal conjunctivae. PERRL, moist mucous membranes.   Chest: Symmetric.    Cardiovascular: Regular rhythm  and rate. Normal S1 and S2. No murmurs. Normal symmetrical pulses.   Respiratory: Non-labored, breathing comfortable without use of accessory muscles. Breath sounds equal bilaterally. No crackles, wheezes, or rhonchi.   Gastrointestinal: Soft, non-distended, non-tender to palpation. Normal bowel sounds.  Genitourinary: Urostomy noted, with yellow urine, area clean and dry. Stoma red.   Musculoskeletal: Not edema. Active ROM of all extremities.   Skin: Warm,dry. No rashes or lesions noted.   Neuro: Oriented x4, no focal deficits, sensation intact. Moving all limbs spontaneously throughout my exam.  Psych: Calm, cooperative, pleasant    Items Needing Follow Up   Pending items or areas that need to be addressed at follow up: None    Pending Labs and Follow Up Radiology    Pending labs and/or radiology review at this time of discharge are listed below: None   Pending Labs Order Current Status    CULTURE-BLOOD W/SENSITIVITY Preliminary result    CULTURE-BLOOD W/SENSITIVITY Preliminary result            Medications        Medication List        ASK your doctor about these medications      acetaminophen  500 mg tablet  Commonly known as: TYLENOL  EXTRA STRENGTH  Dose: 1,000 mg  Take two tablets by mouth every 6 hours as needed for Pain. Max of 4,000 mg of acetaminophen  in 24 hours.  Indications: pain  For: pain  Refills: 0     bacitracin  500 unit/gram topical ointment  Commonly known as: BACTERICIN  Apply  topically to affected area twice daily.  Quantity: 30 g  Refills: 0     carvediloL  6.25 mg tablet  Commonly known as: COREG   Dose: 12.5 mg  take 2 tablets BY MOUTH TWICE DAILY WITH MEALS  Quantity: 180 tablet  Refills: 3     CHOLEcalciferoL  (vitamin D3) 50 mcg (2,000 unit) tablet  Dose: 2,000 Units  Take one tablet by mouth daily.  Quantity: 90 tablet  Refills: 0     cyanocobalamin  (vitamin B-12) 3,000 mcg Cap  Dose: 1 capsule  Take one capsule by mouth daily.  Refills: 0     ezetimibe  10 mg tablet  Commonly known as: ZETIA   Dose: 10 mg  TAKE 1 TABLET BY MOUTH once daily  Quantity: 90 tablet  Refills: 3     FIBER PO  Dose: 1 tablet  Take 1 tablet by mouth twice daily.  Refills: 0     FREESTYLE LIBRE 3 PLUS SENSOR sensor device  Generic drug: blood-glucose sensor  Use to check blood sugars continuously. Replace every 15 days.  For diagnoses: Type 2 diabetes mellitus with hyperglycemia, without long-term current use of insulin  (CMS-HCC)  Quantity: 2 each  Refills: 11     insulin  pen needles (disposable) 31 gauge x 5/16 pen needle  Commonly known as: COMFORT EZ PEN NEEDLES  Use to inject insulin  1x/day  For diagnoses: Type 2 diabetes mellitus with hyperglycemia, without long-term current use of insulin  (CMS-HCC)  Quantity: 100 each  Refills: 3     LANTUS  SOLOSTAR U-100 INSULIN  100 unit/mL (3 mL) subcutaneous PEN  Generic drug: insulin  glargine  Dose: 26-50 Units  Doctor's comments: The pharmacist may select Lantus  Solorstar or Basaglar  Kwikpen based on what is cheaper for the patient.  Inject twenty six Units to fifty Units under the skin at bedtime daily. Max 50 units/day  For diagnoses: Type 2 diabetes mellitus with hyperglycemia, without long-term current use of insulin  (CMS-HCC)  Quantity:  15 mL  Refills: 5     Leg Brace Misc  Bilateral Fitted leg brace and right shoe for foot drop.  Dx: spina bifida.  Quantity: 1 each  Refills: 0     levETIRAcetam  500 mg tablet  Commonly known as: KEPPRA   Dose: 500 mg  Take one tablet by mouth twice daily.  For diagnoses: Focal Epilepsy  Quantity: 180 tablet  Refills: 3     loperamide  2 mg capsule  Commonly known as: IMODIUM  A-D  Dose: 2 mg  Take one capsule by mouth as Needed for Diarrhea.  Refills: 0     losartan  50 mg tablet  Commonly known as: COZAAR   Dose: 50 mg  Take 1 tablet by mouth daily  Quantity: 90 tablet  Refills: 3     metFORMIN -XR 500 mg extended release tablet  Commonly known as: GLUCOPHAGE  XR  TAKE 1 TABLET BY MOUTH IN THE MORNING AND 2 tablets IN THE EVENING with meals  For diagnoses: Type 2 diabetes mellitus without complication, without long-term current use of insulin  (CMS-HCC)  Quantity: 270 tablet  Refills: 1     MOUNJARO  12.5 mg/0.5 mL injector PEN  Generic drug: tirzepatide   Dose: 12.5 mg  Inject 0.5 mL under the skin every 7 days.  For diagnoses: Type 2 diabetes mellitus with hyperglycemia, without long-term current use of insulin  (CMS-HCC)  Quantity: 2 mL  Refills: 5     ONE-A-DAY MEN'S MULTIVITAMIN PO  Dose: 1 tablet  Take 1 tablet by mouth daily.  Refills: 0     rosuvastatin  40 mg tablet  Commonly known as: CRESTOR   Dose: 40 mg  TAKE 1 TABLET BY MOUTH at bedtime  Quantity: 90 tablet  Refills: 3     spironolactone  25 mg tablet  Commonly known as: ALDACTONE   Dose: 50 mg  take 2 tablets BY MOUTH ONCE daily WITH FOOD  For diagnoses: Essential (primary) hypertension, Other cardiomyopathies (CMS-HCC)  Quantity: 180 tablet  Refills: 1     VASCEPA  1 gram capsule  Generic drug: icosapent  ethyL  Dose: 2 g  TAKE TWO CAPSULES BY MOUTH TWICE DAILY WITH MEALS  Quantity: 360 capsule  Refills: 1            Return Appointments and Scheduled Appointments     Scheduled appointments:      Dec 26, 2023 2:00 PM  Telephone visit with Nathanel Miyamoto, [facility]  Family Medicine: [place] [facility], [facility]) [address]  [address], [place] [address]  [place] [address]  [phone]     Feb 08, 2024 11:40 AM  Office visit with Norman CHRISTELLA Coder, DO  Cardiovascular Medicine: [facility] [place] ([facility] Exam) [address]  [address] 5, [place] JONETTA FORBES JULIANNA  [place]  [place] [address]  [phone]     Mar 27, 2024 1:00 PM  Lab Visit with [facility]  Lab, Draw Station: [place], [facility] (--) [address]  [address]  [place] [address]  [phone]     Mar 27, 2024 1:30 PM  (Arrive by 1:15 PM)  Office visit with Ruther LELON Flies, MD  Oncology: [place], [facility] Ex[facility]) [address]  [address]  [place] [address]  [phone]     Jun 21, 2024 1:30 PM  Office visit with Harlene DELENA Dustman, MD  Internal Medicine: [place], [facility]) [address]  [address], [place] [address]  [place] [address]  [phone]  Additional appointment instructions:    Please call your PCP to schedule follow up within 7-10 days of discharge.          Additional Discharge Appointments    Please call your PCP to schedule follow up within 7-10 days of discharge.          Consults, Procedures, Diagnostics, Micro, Pathology   Consults: None  Surgical Procedures & Dates: noted in brief hospital course, if applicable  Significant Diagnostic Studies, Micro and Procedures: noted in brief hospital course  Significant Pathology: noted in brief hospital course     Wound:      Wounds Pressure injury Coccyx (Active)   12/20/23 0733   Wound Type: Pressure injury   Orientation:    Location: Coccyx   Wound Location Comments:    Initial Wound Site Closure:    Initial Dressing Placed:    Initial Cycle:    Initial Suction Setting (mmHg):    Pressure Injury Stages: Stage 1   Pressure Injury Present Within 24 Hours of Hospital Admission: Yes   If This Pressure Injury Is Suspected to Be Device Related, Please Select the Device::    Is the Wound Open or Closed:    Wound Assessment Pink;Pale 12/21/23 0837   Peri-wound Assessment Dry;Intact 12/21/23 0837   Wound Drainage Amount None 12/21/23 0837   Wound Dressing Status None/open to air 12/21/23 0837   Wound Care Treatment or ointment applied 12/20/23 0800   Wound Dressing and/or Treatment Barrier cream 12/20/23 0800   Number of days: 1                      Discharge Disposition, Condition   Patient Disposition: Home with self care  Condition at Discharge: Stable    Code Status   Full Code    Patient Instructions     Activity       Activity as Tolerated   As directed      It is important to keep increasing your activity level after you leave the hospital.  Moving around can help prevent blood clots, lung infection (pneumonia) and other problems.  Gradually increasing the number of times you are up moving around will help you return to your normal activity level more quickly.  Continue to increase the number of times you are up to the chair and walking daily to return to your normal activity level. Begin to work toward your normal activity level at discharge.          Diet       Regular Diet   As directed      You have no dietary restriction. Please continue with a healthy balanced diet.             Discharge education provided to patient. and Signs and Symptoms:   Report these signs and symptoms       Report These Signs and Symptoms   As directed      Please contact your doctor if you have any of the following symptoms: temperature higher than 100.4 degrees F, uncontrolled pain, persistent nausea and/or vomiting, difficulty breathing, chest pain, severe abdominal pain, or unable to urinate.          Additional Orders: Case Management, Supplies, Home Health     Home Health/DME       None          Discharge APP Time: I, the APP, spent greater than 30 minutes in counseling pt, coordinating discharge care,  placing discharge orders and complete discharge summary.    Signed:  Joshua FORBES Batter, APRN-NP  12/21/2023    cc:  Primary Care Physician:  Bethena Harlene LABOR   Verified    Referring physicians:  No ref. provider found   Additional provider(s):      Did we miss something? If additional records are needed, please fax a request on office letterhead to [phone]. Please include the patient's name, date of birth, fax number and type of information needed. Additional request can be made by email at [email or web address]. For general questions of information about electronic records sharing, call [phone].

## 2023-12-22 ENCOUNTER — Encounter: Admit: 2023-12-22 | Discharge: 2023-12-22 | Payer: MEDICARE

## 2023-12-25 LAB — CULTURE-BLOOD W/SENSITIVITY

## 2023-12-26 ENCOUNTER — Ambulatory Visit: Admit: 2023-12-26 | Discharge: 2023-12-27 | Payer: MEDICARE

## 2023-12-26 NOTE — Progress Notes [1]
 A follow-up comprehensive medication management visit was completed today via telephone.    Referral reason: Diabetes Management  Referring provider: Harlene Dustman, MD    Assessment & Plan     Diabetes  Patient's diabetes is controlled as reflected by home blood sugars. Their goal glucose is pre-prandial 80-130 and post-prandial <180. Their most recent A1C was 7.6% on 12/14/2023 with a goal of < 7%    Repeat A1C still not at goal. Patient reported home fasting blood sugars are at goal, likely experiencing elevated post-prandial glucose. Patient plans on resuming CGM use but would like to hold off until after the holidays since he is with family and is not able to control what he eats. Will follow up in a month to review blood sugar and CGM data.     Patient was not able to tolerate Mounjaro  15 mg. Had discussed SGLT-2 inhibitor previously, however, due to concerns with urosepsis and recurrent UTIs, will avoid this agent at this time.     Plan  - Continue Lantus  26 units once daily   - Continue Mounjaro  12.5 mg once a week on Tuesdays  - Continue metformin  XR 500 mg AM and 1000 mg PM    Follow-up  The patient will continue to follow up with the pharmacist. Return to pharmacist in 4 weeks via telephone. The return visit was scheduled during today's visit.  Subjective & Objective      Hasn't been wearing CGM. Has been checking blood via fingerstick. Does have cgm sensor left but would like to not wear it until after the holidays since he is with family and hard to control what he eats.   Fasting BG 97-128 mg/dL   Hasn't had an symptoms of low blood sugars   Hasn't had GI side effects with the metformin  or Mounjaro .   Was hospitalized recently for UTI, discharged on abx - still has 3 days left  Seeing PCP for f/u this week    Diabetes    HPI  Current diabetes medication regimen:   - metformin  XR 500 mg in the morning and 1000 mg in the evening   - Mounjaro  12.5 mg once a week on Tuesdays  - insulin  glargine 26 units once daily in the morning at 9 am  Previous medications for diabetes:   - Mounjaro  15 mg - unable to tolerate   - Trulicity  - switched to Mounjaro    - metformin  IR - diarrhea   - Januvia - switched to Trulicity      Blood Glucose  Monitoring times: fasting  The baseline HbA1C was 9.4 on 07/07/2020.    Diet and Exercise  Number of meals per day: 2  Breakfast: doesn't usually eat breakfast  Lunch: chicken salad  Dinner: mac and cheese, chicken salad, veggies, usually fairly carb heavy   Snacks: sometimes will snack throughout the day - popcorn, cookies, rice crispy, potato chips   Drinks: [place]  lite half and half tea - 12 oz can, sometimes will hvae 2-3 /day  Last meal of the day is usually 5 pm     Comorbidities  ASCVD condition(s): none  CKD: no  Heart failure: no    Obesity: yes    Health Maintenance  Aspirin  utilization: Patient is not taking aspirin  due to the following reason: not indicated.  Statin utilization: Patient is taking a statin.  Hypertension: Patient has hypertension, which is not controlled.    Primary Care - Labs   Basic Metabolic Profile    Lab Results  Component Value Date/Time    NA 140 12/21/2023 05:43 AM    K 4.7 12/21/2023 05:43 AM    CA 9.3 12/21/2023 05:43 AM    CL 105 12/21/2023 05:43 AM    CO2 27 12/21/2023 05:43 AM    GAP 8 12/21/2023 05:43 AM    EGFR1 >60 09/01/2022 03:14 PM    Lab Results   Component Value Date/Time    BUN 14 12/21/2023 05:43 AM    CR 0.60 12/21/2023 05:43 AM    GLU 157 (H) 12/21/2023 05:43 AM         Lab Draw:  Lab Results   Component Value Date/Time    HGBA1C 7.7 (H) 12/14/2023 01:59 PM    HGBA1C 7.8 (H) 06/02/2023 03:02 PM    HGBA1C 8.8 (H) 09/01/2022 03:14 PM     POC:  Lab Results   Component Value Date/Time    A1C 7.8 (A) 12/02/2022 12:00 AM    A1C 8.3 (A) 04/27/2022 12:00 AM        No results found for: MCALB24   Microalbumin/CR ratio Urine   Date Value Ref Range Status   12/02/2022 92.5 (H) <30.0 ?g/mg Final     Urine Microalbumin   Date Value Ref Range Status 12/02/2022 84.2 ?g/mL Final        Lab Results   Component Value Date    CHOL 103 12/14/2023    TRIG 159 (H) 12/14/2023    HDL 38 (L) 12/14/2023    LDL 62.94 12/14/2023    VLDL 31.8 12/14/2023    NONHDLCHOL 65 12/14/2023    CHOLHDLC 6.9 (H) 10/29/2020         BP Readings from Last 1 Encounters:   12/21/23 112/74      Medication History  Medication history was not completed because previously completed (follow-up visit).    Adverse Drug Reactions  Adverse drug reactions were reviewed with the patient.    Significant adverse drug reaction(s) were not identified.    Side effect(s) were not reported.    The patient is filling their medications through 2 pharmacies due to the following reason(s): patient preference. Patient is [facility] using [facility]. Patient is currently using [facility] based on patient preference.  Home Medications   Medication Sig   acetaminophen  (TYLENOL  EXTRA STRENGTH) 500 mg tablet Take two tablets by mouth every 6 hours as needed for Pain. Max of 4,000 mg of acetaminophen  in 24 hours.  Indications: pain   blood-glucose sensor (FREESTYLE LIBRE 3 PLUS SENSOR) sensor device Use to check blood sugars continuously. Replace every 15 days.   carvediloL  (COREG ) 6.25 mg tablet take 2 tablets BY MOUTH TWICE DAILY WITH MEALS   cefpodoxime  (VANTIN ) 200 mg tablet Take one tablet by mouth every 12 hours for 8 days. Take with food.  Indications: genitourinary tract infections   cholecalciferol (+) (VITAMIN D3) 2,000 unit tablet Take one tablet by mouth daily.   cyanocobalamin  (vitamin B-12) 3,000 mcg cap Take one capsule by mouth daily.   ezetimibe  (ZETIA ) 10 mg tablet TAKE 1 TABLET BY MOUTH once daily   insulin  glargine-yfgn (SEMGLEE  U-100) 100 unit/mL (3 mL) injectable PEN Inject twenty six Units under the skin every morning. Indications: type 2 diabetes mellitus   insulin  pen needles (disposable) (COMFORT EZ PEN NEEDLES) 31 gauge x 5/16 pen needle Use to inject insulin  1x/day   Leg Brace misc Bilateral Fitted leg brace and right shoe for foot drop.  Dx: spina bifida.  levETIRAcetam  (KEPPRA ) 500 mg tablet Take one tablet by mouth twice daily.   loperamide  (IMODIUM  A-D) 2 mg capsule Take one capsule by mouth as Needed for Diarrhea.   losartan  (COZAAR ) 50 mg tablet Take 1 tablet by mouth daily   metFORMIN -XR (GLUCOPHAGE  XR) 500 mg extended release tablet TAKE 1 TABLET BY MOUTH IN THE MORNING AND 2 tablets IN THE EVENING with meals   multivit-min/folic/vit K/lycop (ONE-A-DAY MEN'S MULTIVITAMIN PO) Take 1 tablet by mouth daily.   psyllium seed (with sugar) (FIBER PO) Take 1 tablet by mouth twice daily.   rosuvastatin  (CRESTOR ) 40 mg tablet TAKE 1 TABLET BY MOUTH at bedtime   spironolactone  (ALDACTONE ) 25 mg tablet take 2 tablets BY MOUTH ONCE daily WITH FOOD   tirzepatide  (MOUNJARO ) 12.5 mg/0.5 mL injector PEN Inject 0.5 mL under the skin every 7 days.   VASCEPA  1 gram capsule TAKE TWO CAPSULES BY MOUTH TWICE DAILY WITH MEALS      Education  Education provided: not necessary    Time spent with patient: 10 minutes  Nathanel Miyamoto, PHARMD

## 2023-12-28 ENCOUNTER — Encounter: Admit: 2023-12-28 | Discharge: 2023-12-28 | Payer: MEDICARE

## 2023-12-28 ENCOUNTER — Ambulatory Visit: Admit: 2023-12-28 | Discharge: 2023-12-29 | Payer: MEDICARE

## 2023-12-28 VITALS — BP 123/82

## 2023-12-28 DIAGNOSIS — R109 Unspecified abdominal pain: Secondary | ICD-10-CM

## 2023-12-28 DIAGNOSIS — Z09 Encounter for follow-up examination after completed treatment for conditions other than malignant neoplasm: Principal | ICD-10-CM

## 2023-12-28 DIAGNOSIS — N390 Urinary tract infection, site not specified: Secondary | ICD-10-CM

## 2023-12-28 NOTE — Progress Notes [1]
 Telehealth Visit Note    Date of Service: 12/28/2023      Subjective:         Allen Gill is a 33 y.o. male.    Chief Complaint   Patient presents with    Post-hospital Follow Up     @ [facility]  Spot on arm- staph? Putting Bactrim  on it 2-3x daily. Was a boil-   Pain in abdomen left side- waiting to see if goes away after finishes antibiotic.  Last pill of Cefpodoxime  tomorrow am for UTI- developing stricture?  No side effects-       History of Present Illness    History of Present Illness  Allen Gill is a 33 year old male who presents for telehealth visit for hospital discharge follow-up.  He was admitted to [facility] from December 8 to December 10.    He has persistent left lower quadrant abdominal pain rated 4 to 5 out of 10. The pain has improved compared with before his recent hospitalization but has not resolved despite completing antibiotics.    While in [place]  he noted a large amount of blood in his urostomy bag and returned to [place]  for evaluation. The urine then cleared, but he developed back and kidney pain and was diagnosed with a urinary tract infection and started on antibiotics. Imaging noted prior strictures with a possible new stricture. He has urology follow-up arranged for further evaluation.    He is worried about a spot on his arm that he thinks may be a staph infection. Since hospital discharge he has applied Bactroban two to three times daily. The area is pinkish red, and he is monitoring it for change.    He has at least one bowel movement daily without current diarrhea and uses a medication that supports regular bowel movements.           Review of Systems   Constitutional:  Negative for fever.   Gastrointestinal:  Positive for abdominal pain.   Genitourinary:  Negative for hematuria.         Objective:          acetaminophen  (TYLENOL  EXTRA STRENGTH) 500 mg tablet Take two tablets by mouth every 6 hours as needed for Pain. Max of 4,000 mg of acetaminophen  in 24 hours.  Indications: pain blood-glucose sensor (FREESTYLE LIBRE 3 PLUS SENSOR) sensor device Use to check blood sugars continuously. Replace every 15 days.    carvediloL  (COREG ) 6.25 mg tablet take 2 tablets BY MOUTH TWICE DAILY WITH MEALS    cefpodoxime  (VANTIN ) 200 mg tablet Take one tablet by mouth every 12 hours for 8 days. Take with food.  Indications: genitourinary tract infections    cholecalciferol (+) (VITAMIN D3) 2,000 unit tablet Take one tablet by mouth daily.    cyanocobalamin  (vitamin B-12) 3,000 mcg cap Take one capsule by mouth daily.    ezetimibe  (ZETIA ) 10 mg tablet TAKE 1 TABLET BY MOUTH once daily    insulin  glargine-yfgn (SEMGLEE  U-100) 100 unit/mL (3 mL) injectable PEN Inject twenty six Units under the skin every morning. Indications: type 2 diabetes mellitus    insulin  pen needles (disposable) (COMFORT EZ PEN NEEDLES) 31 gauge x 5/16 pen needle Use to inject insulin  1x/day    Leg Brace misc Bilateral Fitted leg brace and right shoe for foot drop.  Dx: spina bifida.    levETIRAcetam  (KEPPRA ) 500 mg tablet Take one tablet by mouth twice daily.    loperamide  (IMODIUM  A-D) 2 mg capsule Take one  capsule by mouth as Needed for Diarrhea.    losartan  (COZAAR ) 50 mg tablet Take 1 tablet by mouth daily    metFORMIN -XR (GLUCOPHAGE  XR) 500 mg extended release tablet TAKE 1 TABLET BY MOUTH IN THE MORNING AND 2 tablets IN THE EVENING with meals    multivit-min/folic/vit K/lycop (ONE-A-DAY MEN'S MULTIVITAMIN PO) Take 1 tablet by mouth daily.    psyllium seed (with sugar) (FIBER PO) Take 1 tablet by mouth twice daily.    rosuvastatin  (CRESTOR ) 40 mg tablet TAKE 1 TABLET BY MOUTH at bedtime    spironolactone  (ALDACTONE ) 25 mg tablet take 2 tablets BY MOUTH ONCE daily WITH FOOD    tirzepatide  (MOUNJARO ) 12.5 mg/0.5 mL injector PEN Inject 0.5 mL under the skin every 7 days.    VASCEPA  1 gram capsule TAKE TWO CAPSULES BY MOUTH TWICE DAILY WITH MEALS      Telehealth Patient Reported Vitals       Row Name 12/28/23 1124                BP: 123/82        BP Method: Automatic/Digital Reading        Weight: 118.1 kg (260 lb 6.4 oz)        Pain Score: SIX        Pain Location: ABDOMEN            There is no height or weight on file to calculate BMI.     Physical Exam  Gen: NAD, alert and oriented  Pulm: nonlabored breathing, able to speak in complete sentences without difficulty  Psych: normal affect    Reviewed hospital stay, DC summary, imaging and labs       Assessment and Plan:  Allen Gill was seen today for post-hospital follow up.    Diagnoses and all orders for this visit:    Hospital discharge follow-up    Urinary tract infection with hematuria, site unspecified    Left sided abdominal pain          Assessment & Plan  Hospital discharge follow-up  Urinary tract infection   improved with antibiotics. Persistent back pain. Urology follow-up scheduled.  - Continue current antibiotic regimen.  - Follow up with urology    Left sided abdominal pain  Persistent pain, possibly due to constipation. CT showed stool in colon. Monitoring pain,  - Improving, constipation has resolved    Skin and soft tissue infection of the arm  Skin infection improving with topical antibiotics  - Continue topical antibiotic ointment application two to three times daily.    Left-sided pain, most likely secondary constipation   CT scan showed stool in colon. Regular bowel movements with current regimen.  - Continue current bowel regimen to maintain regular bowel movements.                      18 minutes spent on this patient's encounter with counseling and coordination of care taking >50% of the visit.

## 2023-12-31 ENCOUNTER — Emergency Department: Admit: 2023-12-31 | Discharge: 2023-12-31 | Payer: MEDICARE

## 2023-12-31 ENCOUNTER — Encounter: Admit: 2023-12-31 | Discharge: 2023-12-31 | Payer: MEDICARE

## 2023-12-31 ENCOUNTER — Inpatient Hospital Stay: Admit: 2023-12-31 | Discharge: 2024-01-03 | Payer: MEDICARE

## 2023-12-31 DIAGNOSIS — Q050 Cervical spina bifida with hydrocephalus: Secondary | ICD-10-CM

## 2023-12-31 DIAGNOSIS — I5042 Chronic combined systolic (congestive) and diastolic (congestive) heart failure: Secondary | ICD-10-CM

## 2023-12-31 DIAGNOSIS — R1084 Generalized abdominal pain: Secondary | ICD-10-CM

## 2023-12-31 DIAGNOSIS — Z936 Other artificial openings of urinary tract status: Secondary | ICD-10-CM

## 2023-12-31 DIAGNOSIS — N319 Neuromuscular dysfunction of bladder, unspecified: Secondary | ICD-10-CM

## 2023-12-31 DIAGNOSIS — K922 Gastrointestinal hemorrhage, unspecified: Secondary | ICD-10-CM

## 2023-12-31 DIAGNOSIS — K921 Melena: Principal | ICD-10-CM

## 2023-12-31 LAB — COMPREHENSIVE METABOLIC PANEL
~~LOC~~ BKR BLD UREA NITROGEN: 20 mg/dL (ref 7–25)
~~LOC~~ BKR CHLORIDE: 100 mmol/L (ref 98–110)
~~LOC~~ BKR CREATININE: 0.6 mg/dL (ref 0.40–1.24)

## 2023-12-31 LAB — POC LACTATE: ~~LOC~~ BKR POC LACTIC ACID: 1.8 mmol/L (ref 0.5–2.0)

## 2023-12-31 LAB — CBC AND DIFF
~~LOC~~ BKR ABSOLUTE BASO COUNT: 0 10*3/uL (ref 0.00–0.20)
~~LOC~~ BKR ABSOLUTE EOS COUNT: 0.2 10*3/uL (ref 0.00–0.45)
~~LOC~~ BKR MCV: 82 fL (ref 80.0–100.0)
~~LOC~~ BKR MDW (MONOCYTE DISTRIBUTION WIDTH): 21 — ABNORMAL HIGH (ref <=20.6)
~~LOC~~ BKR RBC COUNT: 5.1 10*6/uL — ABNORMAL LOW (ref 4.40–5.50)
~~LOC~~ BKR WBC COUNT: 14 10*3/uL — ABNORMAL HIGH (ref 4.50–11.00)

## 2023-12-31 LAB — URINALYSIS MICROSCOPIC REFLEX TO CULTURE
~~LOC~~ BKR SQUAMOUS EPI CELLS: 0 /HPF — ABNORMAL HIGH (ref 7–40)
~~LOC~~ BKR WBC, UA: 20 /HPF — AB (ref 1.005–1.030)

## 2023-12-31 LAB — POC CREATININE: ~~LOC~~ BKR POC CREATININE: 0.8 mg/dL (ref 0.4–1.24)

## 2023-12-31 LAB — URINALYSIS DIPSTICK REFLEX TO CULTURE
~~LOC~~ BKR LEUKOCYTES: NEGATIVE 10*3/uL — ABNORMAL HIGH (ref 0.00–0.80)
~~LOC~~ BKR NITRITE: NEGATIVE mL/min (ref >60–4.80)
~~LOC~~ BKR URINE BILE: NEGATIVE U/L — ABNORMAL HIGH (ref 7–56)

## 2023-12-31 LAB — LIPASE: ~~LOC~~ BKR LIPASE: 60 U/L (ref 11–82)

## 2023-12-31 MED ORDER — ACETAMINOPHEN 325 MG PO TAB
650 mg | ORAL | 0 refills | Status: DC | PRN
Start: 2023-12-31 — End: 2024-01-03
  Administered 2024-01-01 – 2024-01-02 (×3): 650 mg via ORAL

## 2023-12-31 MED ORDER — CEFTRIAXONE INJ 2GM IVP
2 g | Freq: Once | INTRAVENOUS | 0 refills | Status: CP
Start: 2023-12-31 — End: ?
  Administered 2024-01-01: 05:00:00 2 g via INTRAVENOUS

## 2023-12-31 MED ORDER — INSULIN GLARGINE 100 UNIT/ML (3 ML) SC INJ PEN
26 [IU] | Freq: Every evening | SUBCUTANEOUS | 0 refills | Status: DC
Start: 2023-12-31 — End: 2024-01-01

## 2023-12-31 MED ORDER — FENTANYL CITRATE (PF) 50 MCG/ML IJ SOLN
50 ug | Freq: Once | INTRAVENOUS | 0 refills | Status: CP
Start: 2023-12-31 — End: ?
  Administered 2023-12-31: 23:00:00 50 ug via INTRAVENOUS

## 2023-12-31 MED ORDER — DEXTROSE 50 % IN WATER (D50W) IV SYRG
12.5-25 g | INTRAVENOUS | 0 refills | Status: DC | PRN
Start: 2023-12-31 — End: 2024-01-03

## 2023-12-31 MED ORDER — LEVETIRACETAM 500 MG PO TAB
500 mg | Freq: Two times a day (BID) | ORAL | 0 refills | Status: DC
Start: 2023-12-31 — End: 2024-01-03
  Administered 2024-01-01 – 2024-01-03 (×6): 500 mg via ORAL

## 2023-12-31 MED ORDER — PANTOPRAZOLE 40 MG IV SOLR
40 mg | Freq: Two times a day (BID) | INTRAVENOUS | 0 refills | Status: DC
Start: 2023-12-31 — End: 2024-01-01

## 2023-12-31 MED ORDER — MELATONIN 5 MG PO TAB
5 mg | Freq: Every evening | ORAL | 0 refills | Status: DC | PRN
Start: 2023-12-31 — End: 2024-01-03

## 2023-12-31 MED ORDER — HEPARIN, PORCINE (PF) 5,000 UNIT/0.5 ML IJ SYRG
5000 [IU] | SUBCUTANEOUS | 0 refills | Status: DC
Start: 2023-12-31 — End: 2024-01-01

## 2023-12-31 MED ORDER — ONDANSETRON HCL (PF) 4 MG/2 ML IJ SOLN
4 mg | INTRAVENOUS | 0 refills | Status: DC | PRN
Start: 2023-12-31 — End: 2024-01-03

## 2023-12-31 MED ORDER — PANTOPRAZOLE 40 MG IV SOLR
80 mg | Freq: Once | INTRAVENOUS | 0 refills | Status: CP
Start: 2023-12-31 — End: ?
  Administered 2023-12-31: 22:00:00 80 mg via INTRAVENOUS

## 2023-12-31 MED ORDER — PANTOPRAZOLE 40 MG IV SOLR
80 mg | Freq: Once | INTRAVENOUS | 0 refills | Status: DC
Start: 2023-12-31 — End: 2024-01-01

## 2023-12-31 MED ORDER — SENNOSIDES-DOCUSATE SODIUM 8.6-50 MG PO TAB
1 | Freq: Every day | ORAL | 0 refills | Status: DC | PRN
Start: 2023-12-31 — End: 2024-01-03

## 2023-12-31 MED ORDER — INSULIN ASPART 100 UNIT/ML SC FLEXPEN
0-6 [IU] | Freq: Before meals | SUBCUTANEOUS | 0 refills | Status: DC
Start: 2023-12-31 — End: 2024-01-03
  Administered 2024-01-01: 2 [IU] via SUBCUTANEOUS

## 2023-12-31 MED ORDER — FENTANYL CITRATE (PF) 50 MCG/ML IJ SOLN
50 ug | Freq: Once | INTRAVENOUS | 0 refills | Status: CP
Start: 2023-12-31 — End: ?
  Administered 2024-01-01: 03:00:00 50 ug via INTRAVENOUS

## 2023-12-31 MED ORDER — IOHEXOL 350 MG IODINE/ML IV SOLN
100 mL | Freq: Once | INTRAVENOUS | 0 refills | Status: CP
Start: 2023-12-31 — End: ?
  Administered 2023-12-31: 23:00:00 100 mL via INTRAVENOUS

## 2023-12-31 MED ORDER — PANTOPRAZOLE 40 MG IV SOLR
40 mg | Freq: Two times a day (BID) | INTRAVENOUS | 0 refills | Status: DC
Start: 2023-12-31 — End: 2024-01-03
  Administered 2024-01-01 – 2024-01-03 (×5): 40 mg via INTRAVENOUS

## 2023-12-31 MED ORDER — ONDANSETRON 4 MG PO TBDI
4 mg | ORAL | 0 refills | Status: DC | PRN
Start: 2023-12-31 — End: 2024-01-03

## 2023-12-31 MED ORDER — CARVEDILOL 12.5 MG PO TAB
12.5 mg | Freq: Two times a day (BID) | ORAL | 0 refills | Status: DC
Start: 2023-12-31 — End: 2024-01-03
  Administered 2024-01-01 – 2024-01-03 (×5): 12.5 mg via ORAL

## 2023-12-31 MED ORDER — ROSUVASTATIN 20 MG PO TAB
40 mg | Freq: Every evening | ORAL | 0 refills | Status: DC
Start: 2023-12-31 — End: 2024-01-03
  Administered 2024-01-01 – 2024-01-03 (×3): 40 mg via ORAL

## 2023-12-31 MED ORDER — SPIRONOLACTONE 50 MG PO TAB
50 mg | Freq: Every day | ORAL | 0 refills | Status: DC
Start: 2023-12-31 — End: 2024-01-03

## 2023-12-31 MED ORDER — ONDANSETRON HCL (PF) 4 MG/2 ML IJ SOLN
4 mg | Freq: Once | INTRAVENOUS | 0 refills | Status: CP
Start: 2023-12-31 — End: ?
  Administered 2023-12-31: 23:00:00 4 mg via INTRAVENOUS

## 2023-12-31 MED ORDER — POLYETHYLENE GLYCOL 3350 17 GRAM PO PWPK
1 | Freq: Every day | ORAL | 0 refills | Status: DC | PRN
Start: 2023-12-31 — End: 2024-01-03

## 2023-12-31 MED ORDER — SODIUM CHLORIDE 0.9 % IJ SOLN
50 mL | Freq: Once | INTRAVENOUS | 0 refills | Status: CP
Start: 2023-12-31 — End: ?
  Administered 2023-12-31: 23:00:00 50 mL via INTRAVENOUS

## 2023-12-31 MED ORDER — LACTATED RINGERS IV BOLUS
500 mL | Freq: Once | INTRAVENOUS | 0 refills | Status: CP
Start: 2023-12-31 — End: ?
  Administered 2023-12-31: 23:00:00 500 mL via INTRAVENOUS

## 2023-12-31 NOTE — H&P [4]
 Admission History and Physical    Name:  Allen Gill                                                     MRN:  [id number]   Admission Date:  12/31/2023  Admission Diagnosis: GIB (gastrointestinal bleeding) [K92.2]     Principal Problem:    GIB (gastrointestinal bleeding)      Assessment/Plan:     Mildred Bollard is a 33 y.o. 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     # Melena with concern for GIB   - Patient presented with few days of abdominal pain and new onset Melena   - He reports fresh blood when wiping after a bowel movement around 3 PM, which was accompanied by small blood clots.  - Pt denies NSAID use, alcohol use, Tobacco use   - No previous EGD or colonoscopy   - stable vitals on arrival with labs notable for leucocytosis   - CT A/P with Changes of cystectomy and urinary diversion without significant hydronephrosis or urinary tract calculi. 2.  Changes of colostomy/appendicostomy and small bowel anastomoses. No bowel obstruction. 3.  No acute findings by CT.     Plan:   > Admit patient to the floor   > IV Protonix  80 mg then 40 BID   > Consult GI   > CBCQ8  > Transfuse if hgb < 7     # Leucocytosis   # UTI   # Neurogenic bladder s/p ileal conduit and bilateral ureteral reattachment   - Pt with reported flank pain but no fever  - Empiric Ceftriaxone  with pending cultures     # Hypertension.  # Heart failure with recovered EF.  -Asymptomatic.  -Echo 11/05/2021 showed normal EF 50% with no regional wall motion abnormalities.  No significant valvular heart disease.  -Home medications: Coreg  6.25 mg twice daily, losartan  100 mg daily, spironolactone  25 mg daily and ezetimibe .  -Continue PTA Coreg  with hold parameters  > Hold losartan  and spironolactone  in setting of soft BP on admission and concern for GIB  >Continue ezetimibe .     # Diabetes mellitus.  -Home regimen Lantus  26 units, metformin  and Mounjaro .  > Hold Lantus  given NPO status and decrease PO intake.   -LDCF while inpatient.     # Chiari malformation.  -Continue PTA levetiracetam  500 mg twice daily.    FEN NPO    VTE PPX Only SCDs    Dispo  - Admit to the floor      Code Status: Full Code    High medical decision making due to the following:  Decision regarding hospitalization  Review of notes outside of my specialty, Review of each unique test and Ordering of each unique test      Emery Rouleau, MD  Internal Medicine      Note: For any questions/concerns after 8 AM; please contact the assigned team. Voalte is the preferred way of communication.       Subjective:  Primary Care Physician: Bethena Raisin A      Chief Complaint:  Chief Complaint   Patient presents with    Flank Pain     Recent UTI on 12/8 finished antibiotic a couple of days ago, having flank pain since then. Now having L sided abdominal pain     Melena     Noticed dark blood in stool today       HPI: Allen Gill is a 33 y.o. 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     The patient is a 33 year old with heart failure with reduced ejection fraction, type 2 diabetes, and hypertension who presents with abdominal pain and rectal bleeding.    He has been experiencing left-sided abdominal pain for the past four to five days, rated as a seven out of ten in severity. The pain slightly improves with bowel movements. He noticed dark bowel movements earlier today and feels more constipated.    He reports seeing blood when wiping after a bowel movement around 3 PM, which was accompanied by small blood clots. There are no previous episodes of bleeding like this. He denies any history of endoscopy or colonoscopy.    No fever, coughing up blood, smoking, or alcohol use. He has not been taking any NSAIDs such as ibuprofen , Motrin , Aleve, or Naproxen.    He lives alone but is in contact with one of his brothers who lives in town. His current medications include Coreg , insulin  glargine 26 units at night, Keppra  for seizure precaution, losartan , rosuvastatin , spironolactone , metformin , and Mounjaro .    Past Medical History:    ADD (attention deficit disorder)    Allergy    Arnold-Chiari malformation (CMS-HCC)    Cardiomyopathy (CMS-HCC)    Diabetes mellitus (CMS-HCC)    Headache(784.0)    Hydrocephalus (CMS-HCC)    Hypertension    Infection of VP shunt    Kidney stones    Lung disease    Myelomeningocele (CMS-HCC)    Neurogenic bladder    OSA (obstructive sleep apnea)    Preop cardiovascular exam    Seizures (CMS-HCC)    Shunt malfunction    Spina bifida (CMS-HCC)    Tachycardia    Urinary retention    Urinary tract infection        Surgical History:   Procedure Laterality Date    SHUNT CREATION VENTRICULAR PERITONEAL Right 06/14/2014    Performed by Idelia Lango, MD at [facility] OR    SHUNT REVISION VENTRICULAR PERITONEAL Right 04/23/2015    Performed by Idelia Lango, MD at [facility] OR    REMOVAL HARDWARE HEAD: removal of left ventriculopleural shunt Left 05/20/2015    Performed by Idelia Lango, MD at [facility] OR    SHUNT REMOVAL VENTRICULAR PERITONEAL Right 05/23/2015    Performed by Idelia Lango, MD at [facility] OR    SHUNT CREATION VENTRICULAR PERITONEAL Left 06/03/2015    Performed by Idelia Lango, MD at [facility] OR    CYSTOURETHROSCOPY, CYSTOLITHOLAPAXY N/A 08/05/2015    Performed by Ronold Rubin, MD at [facility] OR    HOLMIUM LASER ENUCLEATION OF PROSTATE (NO MORCELLATION) N/A 08/19/2015    Performed by Gwenn Kirke DASEN, MD at [facility] OR    CYSTOSCOPY N/A 08/19/2015    Performed by Gwenn Kirke DASEN, MD at [facility]  OR    CYSTOSCOPY EVACUATION CLOTS N/A 08/29/2015    Performed by Gwenn Kirke DASEN, MD at [facility] OR    CYSTOSCOPY, URETHERAL DILATION N/A 02/20/2016    Performed by Gwenn Kirke DASEN, MD at [facility] OR    CYSTECTOMY, ILEAL CONDUIT N/A 10/13/2016    Performed by Carolynn Silversmith, MD at [facility] OR    REVISION SHUNT - VENTRICULO-PERITONEAL Left 12/16/2016    Performed by Vivica Domino, MD at [facility] OR    CREATION SHUNT - VENTRICULO-PERITONEAL: Left side. 2 hours, proximal catheter and valve in place. will need distal catheter placed, Dr. Shirlean to complete. Supine Left 12/22/2016    Performed by Idelia Lango, MD at [facility] OR    PR [facility] PROGRAMMABLE CEREBROSPINAL SHUNT  01/07/2017    PERCUTANEOUS NEPHROSTOLITHOTOMY/ PYELOSTOLITHOTOMY - GREATER THAN 2 CM Left 05/25/2017    Performed by Lenette Blush, MD, [facility] OR    CREATION SHUNT - VENTRICULO-PERITONEAL Right 08/04/2017    Performed by Idelia Lango, MD at [facility] OR    PERCUTANEOUS NEPHROSTOLITHOTOMY/ PYELOSTOLITHOTOMY - 2 CM OR LESS Right 06/07/2019    Performed by Lenette Blush SAUNDERS, MD at [facility] OR    CYSTOURETHROSCOPY WITH URETEROSCOPY AND/ OR PYELOSCOPY - WITH REMOVAL/ MANIPULATION CALCULUS Right 06/07/2019    Performed by Lenette Blush SAUNDERS, MD at [facility] OR    URETERONEOCYSTOSTOMY - VESICO-PSOAS HITCH/ BLADDER FLAP Right 04/28/2022    Performed by Carolynn Silversmith ORN, MD at [facility] OR    CATHETER IMPLANT/REVISION  12/27/09    distal end of the catheter was revised    CATHETER IMPLANT/REVISION  01/31/10    replacement of ventricular catheter with [employer] framelessstereotaxis catheter    HX ABDOMEN SURGERY      fundoplication    HX ABDOMEN SURGERY  01/2001    Cecostomy    HX BACK SURGERY      repair of spina bifida    HX BRAIN SURGERY  5 months old    Chiari decompression    HX EAR TUBES      HX HERNIA REPAIR      inguinal hernia    HX SURGERY  at 2 weeks    VP Shunt    HX TONSILLECTOMY      HX TRACHEOSTOMY      removed at age 89    SHUNT REVISION  43 months old    SHUNT REVISION  33 years old    SHUNT REVISION  October 2010    [place] SHUNT REVISION  12/11/09    replacment of valve to acodman hakem adjustablevalve    SHUNT REVISION  02/17/10    left frontal ventriculopleural shunt    VENTRICULOSTOMY  02/03/10    removal of all shunt components and placementofright frontal ventriculostomy     Family History   Problem Relation Name Age of Onset    Diabetes Father Florencio Gill     Hypertension Father Dayan Desa     Other Father Quintrell Baze         glaucoma    High Cholesterol Maternal Grandfather Remmel Conyers             Social History[1]       Allergies[2]     Review of Systems  Review of symptoms was performed and is negative except as mentioned in the HPI above.    Objective:      Vital Signs: Last Filed In 24 Hours Vital Signs: 24 Hour Range   BP: 87/61 (12/20 2031)  Temp:  36.7 ?C (98.1 ?F) (12/20 1432)  Pulse: 101 (12/20 2040)  Respirations: 19 PER MINUTE (12/20 2040)  SpO2: 95 % (12/20 2040)  O2 Device: None (Room air) (12/20 1432) BP: (87-110)/(53-71)   Temp:  [36.7 ?C (98.1 ?F)]   Pulse:  [101-113]   Respirations:  [15 PER MINUTE-23 PER MINUTE]   SpO2:  [94 %-97 %]   O2 Device: None (Room air)     PHYSICAL EXAMINATION:    GENERAL APPEARANCE: Alert and Oriented x 4. Appeared to be in no acute distress.  HEAD: Normocephalic, Atraumatic  EYES: PERRL, EOMI.   EARS: External auditory canals and tympanic membranes clear, hearing grossly appears to normal  NOSE: No nasal discharge.  THROAT: No inflammation, swelling, exudate, or lesions. Teeth and gingiva in good general condition.  NECK: Neck supple, non-tender, No JVD. No lymphadenopathy, masses or thyromegaly.  CARDIAC: Regular Rate and Rhythm. No Murmurs. There is no pedal edema. Extremities are warm.   LUNGS: Clear to auscultation. No rales, rhonchi or wheezing.  ABDOMEN: Soft, nondistended, LLQ tenderness. No guarding or rebound. Urostomy in place  EXTREMITIES: No significant deformity or obvious joint abnormality.   MUSKULOSKELETAL: Adequately aligned spine. ROM intact in spine and extremities. No joint erythema or tenderness. Normal muscular development.  NEUROLOGICAL: CN III-XII grossly intact. Strength and sensation grossly symmetric and intact throughout.   SKIN: No lesions or eruptions.  PSYCHIATRIC: The patient was able to demonstrate good judgement and reason, without hallucinations, abnormal affect or abnormal behaviors during the examination. Patient is not suicidal or homicidal.      LABS:  Recent Labs     12/31/23  1603 12/31/23  1620   NA 135*  --    K 4.5  --    CL 100  --    CO2 22  --    GAP 13*  --    BUN 20  --    CR 0.68 0.8   GLU 95  --    CA 9.2  --    ALBUMIN  4.1  --        Recent Labs     12/31/23  1603   WBC 14.50*   HGB 14.5   HCT 42.6   PLTCT 366   AST 47*   ALT 91*   ALKPHOS 73      CrCl cannot be calculated (Unknown ideal weight.).There were no vitals filed for this visit. No results for input(s): PHART, PO2ART in the last 72 hours.    Invalid input(s): PC02A                       MEDS[START ON 01/01/2024] carvedilol , 12.5 mg, Oral, BID  cefTRIAXone   (ROCEPHIN )  injection (IV or IM), 2 g, Intravenous, ONCE  heparin  (porcine), 5,000 Units, Subcutaneous, Q8H  insulin  aspart, 0-6 Units, Subcutaneous, ACHS (22)  insulin  glargine, 26 Units, Subcutaneous, QHS(22)  levETIRAcetam , 500 mg, Oral, BID  pantoprazole , 80 mg, Intravenous, ONCE   Followed by  [START ON 01/01/2024] pantoprazole , 40 mg, Intravenous, BID(11-21)  rosuvastatin , 40 mg, Oral, QHS  [START ON 01/01/2024] spironolactone , 50 mg, Oral, QDAY     IV MEDS  Prn acetaminophen  Q4H PRN, dextrose  50% PRN, melatonin QHS PRN, ondansetron  Q6H PRN **OR** ondansetron  Q6H PRN, polyethylene glycol 3350  QDAY PRN, sennosides-docusate sodium  QDAY PRN     HOME MEDS Medications Ordered Prior to Encounter[3]       CT ABD/PELV W CONTRAST  Result Date: 12/31/2023  1.  Changes of  cystectomy and urinary diversion without significant hydronephrosis or urinary tract calculi. 2.  Changes of colostomy/appendicostomy and small bowel anastomoses. No bowel obstruction. 3.  No acute findings by CT.  Finalized by Cathaleen JONETTA Race, MD on 12/31/2023 6:05 PM. Dictated by Cathaleen JONETTA Race, MD on 12/31/2023 5:43 PM.           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 Amox/clav in 2014. He certainly took it.  Notes from that time indicate diarrhea. Discussed this with him on 03/13/18 and he stated that he'd taken amox 3 times in past and each time had HA, debilitating fatigue and diarrhea. And maybe rash. At this time he was tolerating IV ampicillin . Doria, MD 03/13/18  Update Luchi MD, 11/28/18: He got IV ampicillin  at [facility] 2/29 to 03/15/18 w/o rash or SE. He was started on Augmentin  05/08/19 without significant SE    Ceclor [Cefaclor] HIVES     Pt has tolerated ancef , keflex , cefpodoxime , ceftriaxone , cefoxitin , and cefepime  (many prescriptions for these in med review in [place]).    Clindamycin HIVES    Zosyn  [Piperacillin -Tazobactam] HIVES   [3]   No current facility-administered medications on file prior to encounter.     Current Outpatient Medications on File Prior to Encounter   Medication Sig Dispense Refill    acetaminophen  (TYLENOL  EXTRA STRENGTH) 500 mg tablet Take two tablets by mouth every 6 hours as needed for Pain. Max of 4,000 mg of acetaminophen  in 24 hours.  Indications: pain      blood-glucose sensor (FREESTYLE LIBRE 3 PLUS SENSOR) sensor device Use to check blood sugars continuously. Replace every 15 days. 2 each 11    carvediloL  (COREG ) 6.25 mg tablet take 2 tablets BY MOUTH TWICE DAILY WITH MEALS 180 tablet 3    cholecalciferol (+) (VITAMIN D3) 2,000 unit tablet Take one tablet by mouth daily. 90 tablet 0    cyanocobalamin  (vitamin B-12) 3,000 mcg cap Take one capsule by mouth daily.      ezetimibe  (ZETIA ) 10 mg tablet TAKE 1 TABLET BY MOUTH once daily 90 tablet 3    insulin  glargine-yfgn (SEMGLEE  U-100) 100 unit/mL (3 mL) injectable PEN Inject twenty six Units under the skin every morning. Indications: type 2 diabetes mellitus 15 mL 3    insulin  pen needles (disposable) (COMFORT EZ PEN NEEDLES) 31 gauge x 5/16 pen needle Use to inject insulin  1x/day 100 each 3    Leg Brace misc Bilateral Fitted leg brace and right shoe for foot drop.  Dx: spina bifida. 1 each 0    levETIRAcetam  (KEPPRA ) 500 mg tablet Take one tablet by mouth twice daily. 180 tablet 3    loperamide  (IMODIUM  A-D) 2 mg capsule Take one capsule by mouth as Needed for Diarrhea.      losartan  (COZAAR ) 50 mg tablet Take 1 tablet by mouth daily  90 tablet 3    metFORMIN -XR (GLUCOPHAGE  XR) 500 mg extended release tablet TAKE 1 TABLET BY MOUTH IN THE MORNING AND 2 tablets IN THE EVENING with meals 270 tablet 1    multivit-min/folic/vit K/lycop (ONE-A-DAY MEN'S MULTIVITAMIN PO) Take 1 tablet by mouth daily.      psyllium seed (with sugar) (FIBER PO) Take 1 tablet by mouth twice daily.      rosuvastatin  (CRESTOR ) 40 mg tablet TAKE 1 TABLET BY MOUTH at bedtime 90 tablet 3    spironolactone  (ALDACTONE ) 25 mg tablet take 2 tablets BY MOUTH ONCE daily WITH FOOD 180 tablet 1    tirzepatide  (MOUNJARO ) 12.5 mg/0.5 mL injector PEN Inject 0.5 mL under the skin every 7 days. 2 mL 5    VASCEPA  1 gram capsule TAKE TWO CAPSULES BY MOUTH TWICE DAILY WITH MEALS 360 capsule 1

## 2023-12-31 NOTE — ED Provider Notes [19]
 Emergency Department Provider Note    Chief Complaint:  Chief Complaint   Patient presents with   ? Flank Pain     Recent UTI on 12/8 finished antibiotic a couple of days ago, having flank pain since then. Now having L sided abdominal pain    ? Melena     Noticed dark blood in stool today     History of Present Illness:  Pt with hx spina bifida, neurogenic bladder with ileal conduit, nephrolithiasis presents for R flank pain, L abdominal pain and hematochezia. Pt was recently seen here for UTI, given rocephin  and cefpodoxime  (cultures grew multiple organisms and called as contaminated) which helped with sx, but has had return of flank pain similar in quality to prior presentation. Still producing clear urine from ileal conduit, no obvious clouding or purulence. Additionally, pt has had several days of new L-sided aching 7-8/10 abdominal pain, worse and sharpening with movement and slightly improved with stooling or positioning, as well as an episode of dark blood in stools this AM. Pt feels constipated, but has been passing loose brown stools.  Denies fever, chills, CP, SOB, cough, HA, LOC, last seizure in 2012.        Review of Systems:  Pertinent ROS was obtained and non-contributory except as noted in HPI.     Allergies:  Latex, Amoxicillin , Ceclor [cefaclor], Clindamycin, and Zosyn  [piperacillin -tazobactam]    Past Medical History:  Past Medical History:   ? ADD (attention deficit disorder)   ? Allergy   ? Arnold-Chiari malformation (CMS-HCC)   ? Cardiomyopathy (CMS-HCC)   ? Diabetes mellitus (CMS-HCC)   ? Headache(784.0)   ? Hydrocephalus (CMS-HCC)   ? Hypertension   ? Infection of VP shunt   ? Kidney stones   ? Lung disease   ? Myelomeningocele (CMS-HCC)   ? Neurogenic bladder   ? OSA (obstructive sleep apnea)   ? Preop cardiovascular exam   ? Seizures (CMS-HCC)   ? Shunt malfunction   ? Spina bifida (CMS-HCC)   ? Tachycardia   ? Urinary retention   ? Urinary tract infection     Past Surgical History:  Surgical History:   Procedure Laterality Date   ? SHUNT CREATION VENTRICULAR PERITONEAL Right 06/14/2014    Performed by Idelia Lango, MD at [facility] OR   ? SHUNT REVISION VENTRICULAR PERITONEAL Right 04/23/2015    Performed by Idelia Lango, MD at [facility] OR   ? REMOVAL HARDWARE HEAD: removal of left ventriculopleural shunt Left 05/20/2015    Performed by Idelia Lango, MD at [facility] OR   ? SHUNT REMOVAL VENTRICULAR PERITONEAL Right 05/23/2015    Performed by Idelia Lango, MD at [facility] OR   ? SHUNT CREATION VENTRICULAR PERITONEAL Left 06/03/2015    Performed by Idelia Lango, MD at [facility] OR   ? CYSTOURETHROSCOPY, CYSTOLITHOLAPAXY N/A 08/05/2015    Performed by Ronold Rubin, MD at [facility] OR   ? HOLMIUM LASER ENUCLEATION OF PROSTATE (NO MORCELLATION) N/A 08/19/2015    Performed by Gwenn Kirke DASEN, MD at [facility] OR   ? CYSTOSCOPY N/A 08/19/2015    Performed by Gwenn Kirke DASEN, MD at [facility] OR   ? CYSTOSCOPY EVACUATION CLOTS N/A 08/29/2015    Performed by Gwenn Kirke DASEN, MD at [facility] OR   ? CYSTOSCOPY, URETHERAL DILATION N/A 02/20/2016    Performed by Gwenn Kirke DASEN, MD at [facility] OR   ? CYSTECTOMY, ILEAL CONDUIT N/A 10/13/2016    Performed by Carolynn Silversmith, MD at [facility] OR   ?  REVISION SHUNT - VENTRICULO-PERITONEAL Left 12/16/2016    Performed by Vivica Domino, MD at [facility] OR   ? CREATION SHUNT - VENTRICULO-PERITONEAL: Left side. 2 hours, proximal catheter and valve in place. will need distal catheter placed, Dr. Shirlean to complete. Supine Left 12/22/2016    Performed by Idelia Lango, MD at [facility] OR   ? PR REPRGRMG PROGRAMMABLE CEREBROSPINAL SHUNT  01/07/2017   ? PERCUTANEOUS NEPHROSTOLITHOTOMY/ PYELOSTOLITHOTOMY - GREATER THAN 2 CM Left 05/25/2017    Performed by Lenette Blush, MD, [facility] OR   ? CREATION SHUNT - VENTRICULO-PERITONEAL Right 08/04/2017    Performed by Idelia Lango, MD at [facility] OR   ? PERCUTANEOUS NEPHROSTOLITHOTOMY/ PYELOSTOLITHOTOMY - 2 CM OR LESS Right 06/07/2019    Performed by Lenette Blush SAUNDERS, MD at [facility] OR   ? CYSTOURETHROSCOPY WITH URETEROSCOPY AND/ OR PYELOSCOPY - WITH REMOVAL/ MANIPULATION CALCULUS Right 06/07/2019    Performed by Lenette Blush SAUNDERS, MD at [facility] OR   ? URETERONEOCYSTOSTOMY - VESICO-PSOAS HITCH/ BLADDER FLAP Right 04/28/2022    Performed by Carolynn Ruther ORN, MD at [facility] OR   ? CATHETER IMPLANT/REVISION  12/27/09    distal end of the catheter was revised   ? CATHETER IMPLANT/REVISION  01/31/10    replacement of ventricular catheter with [employer] framelessstereotaxis catheter   ? HX ABDOMEN SURGERY      fundoplication   ? HX ABDOMEN SURGERY  01/2001    Cecostomy   ? HX BACK SURGERY      repair of spina bifida   ? HX BRAIN SURGERY  5 months old    Chiari decompression   ? HX EAR TUBES     ? HX HERNIA REPAIR      inguinal hernia   ? HX SURGERY  at 2 weeks    VP Shunt   ? HX TONSILLECTOMY     ? HX TRACHEOSTOMY      removed at age 7   ? SHUNT REVISION  3 months old   ? SHUNT REVISION  33 years old   ? SHUNT REVISION  October 2010    [place]   ? SHUNT REVISION  12/11/09    replacment of valve to acodman hakem adjustablevalve   ? SHUNT REVISION  02/17/10    left frontal ventriculopleural shunt   ? VENTRICULOSTOMY  02/03/10    removal of all shunt components and placementofright frontal ventriculostomy     Social History:  Social History     Tobacco Use   ? Smoking status: Never   ? Smokeless tobacco: Never   Vaping Use   ? Vaping status: Never Used   Substance Use Topics   ? Alcohol use: Never   ? Drug use: Never     Family History:  Family History   Problem Relation Name Age of Onset   ? Diabetes Father Eula Mazzola    ? Hypertension Father Clary Meeker    ? Other Father Jakhai Fant         glaucoma   ? High Cholesterol Maternal Grandfather Remmel Conyers      Pertinent medical/surgical/social/family/allergy history reviewed.     Vitals:  ED Vitals      Date and Time T BP P RR SPO2 L User   12/31/23 1432 36.7 ?C (98.1 ?F) -- 113 18 PER MINUTE 95 % -- CB   12/31/23 1441 -- 110/53 -- -- -- -- KW   12/31/23 1600 -- 106/71 106 15 PER MINUTE 97 % -- TC  12/31/23 2031 -- 87/61 101 23 PER MINUTE 94 % -- TC   12/31/23 2040 -- -- 101 19 PER MINUTE 95 % -- TC          Physical Exam:  Physical Exam  Vitals and nursing note reviewed. Exam conducted with a chaperone present.   Constitutional:       General: He is not in acute distress.     Appearance: He is not ill-appearing or toxic-appearing.   HENT:      Head: Normocephalic and atraumatic.      Right Ear: External ear normal.      Left Ear: External ear normal.      Nose: Nose normal.      Mouth/Throat:      Mouth: Mucous membranes are moist.      Pharynx: Oropharynx is clear.   Eyes:      Extraocular Movements: Extraocular movements intact.      Pupils: Pupils are equal, round, and reactive to light.   Cardiovascular:      Rate and Rhythm: Normal rate and regular rhythm.      Pulses: Normal pulses.      Heart sounds: Normal heart sounds. No murmur heard.     No friction rub. No gallop.   Pulmonary:      Effort: Pulmonary effort is normal. No respiratory distress.      Breath sounds: Normal breath sounds. No stridor. No wheezing, rhonchi or rales.   Abdominal:      General: There is no distension.      Palpations: Abdomen is soft. There is no mass.      Tenderness: There is abdominal tenderness (sharp LUQ>LLQ). There is no guarding or rebound.      Hernia: No hernia is present.   Genitourinary:     Prostate: Normal.      Rectum: Guaiac result positive. No mass, tenderness, anal fissure, external hemorrhoid or internal hemorrhoid. Normal anal tone.      Comments: EMT Marty present for exam, firm stool present in rectal vault. Slight chafing around anus, no obvious bleeding/breakdown  Musculoskeletal:      Right lower leg: No edema.      Left lower leg: No edema.      Comments: Bil AFOs present   Skin:     General: Skin is warm and dry.      Capillary Refill: Capillary refill takes less than 2 seconds.   Neurological:      General: No focal deficit present.      Mental Status: He is alert and oriented to person, place, and time.   Psychiatric:         Mood and Affect: Mood normal.         Behavior: Behavior normal.         Thought Content: Thought content normal.         Laboratory Results:  Labs Reviewed   CBC AND DIFF - Abnormal       Result Value Ref Range Status    White Blood Cells 14.50 (*) 4.50 - 11.00 10*3/uL Final    Red Blood Cells 5.17  4.40 - 5.50 10*6/uL Final    Hemoglobin 14.5  13.5 - 16.5 g/dL Final    Hematocrit 57.3  40.0 - 50.0 % Final    MCV 82.4  80.0 - 100.0 fL Final    MCH 28.0  26.0 - 34.0 pg Final    MCHC 34.0  32.0 - 36.0 g/dL Final  RDW 13.5  11.0 - 15.0 % Final    Platelet Count 366  150 - 400 10*3/uL Final    MPV 8.8  7.0 - 11.0 fL Final    Neutrophils 71.2  41.0 - 77.0 % Final    Lymphocytes 20.6 (*) 24.0 - 44.0 % Final    Monocytes 6.4  4.0 - 12.0 % Final    Eosinophils 1.5  0.0 - 5.0 % Final    Basophils 0.3  0.0 - 2.0 % Final    Absolute Neutrophil Count 10.30 (*) 1.80 - 7.00 10*3/uL Final    Absolute Lymph Count 3.00  1.00 - 4.80 10*3/uL Final    Absolute Monocyte Count 0.90 (*) 0.00 - 0.80 10*3/uL Final    Absolute Eosinophil Count 0.20  0.00 - 0.45 10*3/uL Final    Absolute Basophil Count 0.00  0.00 - 0.20 10*3/uL Final    MDW (Monocyte Distribution Width) 21.0 (*) <=20.6 Final   COMPREHENSIVE METABOLIC PANEL - Abnormal    Sodium 135 (*) 137 - 147 mmol/L Final    Potassium 4.5  3.5 - 5.1 mmol/L Final    Chloride 100  98 - 110 mmol/L Final    Glucose 95  70 - 100 mg/dL Final    Blood Urea Nitrogen 20  7 - 25 mg/dL Final    Creatinine 9.31  0.40 - 1.24 mg/dL Final    Calcium  9.2  8.5 - 10.6 mg/dL Final    Total Protein 7.6  6.0 - 8.0 g/dL Final    Total Bilirubin 0.3  0.2 - 1.3 mg/dL Final    Albumin  4.1  3.5 - 5.0 g/dL Final    Alk Phosphatase 73  25 - 110 U/L Final    AST 47 (*) 7 - 40 U/L Final    ALT 91 (*) 7 - 56 U/L Final    CO2 22  21 - 30 mmol/L Final    Anion Gap 13 (*) 3 - 12 Final    Glomerular Filtration Rate (GFR) >60  >60 mL/min Final   URINALYSIS DIPSTICK REFLEX TO CULTURE - Abnormal    Color,UA Yellow   Final    Turbidity,UA 2+ (*) Clear Final    Specific Gravity-Urine 1.034 (*) 1.005 - 1.030 Final    pH,UA 6.0  5.0 - 8.0 Final    Protein,UA 3+ (*) Negative Final    Glucose,UA Negative  Negative Final    Ketones,UA Negative  Negative Final    Bilirubin,UA Negative  Negative Final    Blood,UA 2+ (*) Negative Final    Urobilinogen,UA Normal  Normal Final    Nitrite,UA Negative  Negative Final    Leukocytes,UA Negative  Negative Final   URINALYSIS MICROSCOPIC REFLEX TO CULTURE - Abnormal    WBCs,UA 20 - 50 (*) None, 0 - 2  /HPF Final    RBCs,UA Packed (*) None, 0 - 2  /HPF Final    Mucous,UA 1+ (*) None, Trace /LPF Final    Bacteria,UA Few (*) None /HPF Final    Squamous Epithelial Cells 0 - 2  None, 0 - 2 , 2 - 5 /HPF Final   LIPASE - Normal    Lipase 60  11 - 82 U/L Final   POC CREATININE - Normal    Creatinine, POC 0.8  0.4 - 1.24 mg/dL Final   POC LACTATE - Normal    LACTIC ACID POC 1.8  0.5 - 2.0 mmol/L Final   POC HEMOCCULT - DIAGNOSTIC - Normal  POC Hemoccult Negative   Final    POC Hemoccult Quality Control         Lot Number POC Hemoccult [id number] 08-26   Final   CULTURE-URINE W/SENSITIVITY   URINALYSIS, COMPLETE W/REFLEX TO CULTURE    Narrative:     The following orders were created for panel order URINALYSIS, COMPLETE W/REFLEX TO CULTURE.  Procedure                               Abnormality         Status                     ---------                               -----------         ------                     URINALYSIS DIPSTICK REF.SABRASABRA[[id number]]  Abnormal            Final result               URINALYSIS MICROSCOPIC .SABRA.[[id number]]  Abnormal            Final result               EXTRA CLEAR URINE [id number]]                                                     EXTRA URINE GRAY UNE[[id number]]                            In process                   Please view results for these tests on the individual orders.   POC CREATININE   POC LACTATE     Radiology Interpretation:  CT ABD/PELV W CONTRAST   Final Result      1.  Changes of cystectomy and urinary diversion without significant hydronephrosis or urinary tract calculi.   2.  Changes of colostomy/appendicostomy and small bowel anastomoses. No bowel obstruction.   3.  No acute findings by CT.          Finalized by Cathaleen JONETTA Race, MD on 12/31/2023 6:05 PM. Dictated by Cathaleen JONETTA Race, MD on 12/31/2023 5:43 PM.        EKG:  ECG Results    None         Medical Decision Making:  Allen Gill is a 33 y.o. male who presents with chief complaint as listed above. Based on the history and presentation, the list of differential diagnoses considered included, but was not limited to, obstruction, diverticulitis, UTI, colitis  - Initial vitals were reassuring  - See physical exam as noted above  - Labs were obtained and showed stable Hgb, leukocytosis, pyuria  - CTAP was obtained without acute pathology.  - 33 y.o. male presented to the ED for GI bleed and R flank pain in setting of recent UTI.  - Patient was evaluated by resident and attending physicians.  - Available records were reviewed.  - Vitals  were stable throughout ED stay.  - Given fentanyl  and IVF for symptomatic relief  - Pantoprazole  was started in the Emergency Department  - On re-evaluation, pt reports improved discomfort.  - Findings discussed with patient and the plan for admission to medicine.  Patient understands and agrees with plan.  Pt reassessed with VSS and appropriate symptom control for floor.    Adriana Slovak, MD  Emergency Medicine, PGY-2       ED Course as of 12/31/23 2249   Sat Dec 31, 2023   2249 Sign out given to oncoming provider Dr. Levander Kalata    Summary  Stable 32 y.o. y/o male here with UTI, new GI bleed   To Do  N/A  To Be Aware  N/A  Dispo  Admitted    Adriana Slovak, MD   [RZ]      ED Course User Index  [RZ] Slovak Adriana, MD                         Facility Administered Meds:  Medications   lactated ringers  IV bolus 500 mL (0 mL Intravenous Infusion Stopped 12/31/23 1703)   fentaNYL  citrate PF (SUBLIMAZE ) injection 50 mcg (50 mcg Intravenous Given 12/31/23 1632)   ondansetron  HCL (PF) (ZOFRAN  (PF)) injection 4 mg (4 mg Intravenous Given 12/31/23 1632)   pantoprazole  (PROTONIX ) injection 80 mg (80 mg Intravenous Given 12/31/23 1629)   iohexoL  (OMNIPAQUE -350) 350 mg/mL injection 100 mL (100 mL Intravenous Given 12/31/23 1729)   sodium chloride  PF 0.9% injection 50 mL (50 mL Intravenous Given 12/31/23 1729)   fentaNYL  citrate PF (SUBLIMAZE ) injection 50 mcg (50 mcg Intravenous Given 12/31/23 2032)     Coding:  Additional data reviewed:  History was obtained from an independent historian: Not in addition to what is mentioned above  Prior non-ED notes reviewed: Prior labs, Prior imaging, and Recent discharge summary and/or H&P  Independent interpretation of diagnostic tests was performed by me: Not in addition to what is mentioned above  Patient presentation/management was discussed with the following qualified health care professionals and/or other relevant professionals: Admitting Physician    Risk evaluation:  Diagnosis or treatment of patient condition impacted by social determinant of health: None  Tests Considered but not performed due to clinical scoring (if not mentioned in ED course, aside from what is implied by clinical scores listed): N/A  Rationale regarding whether admission or escalation of care considered if not performed (if not mentioned in ED course, aside from what is implied by clinical scores listed): N/A    Clinical Impression and COPA:  Patient's active diagnoses as well as contributing pre-existing medical problems include:  Clinical Impression   Hematochezia   Abdominal pain, generalized   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  New Prescriptions    No medications on file     Procedure Notes:  Procedures       Adriana Slovak, MD

## 2024-01-01 ENCOUNTER — Encounter: Admit: 2024-01-01 | Discharge: 2024-01-01 | Payer: MEDICARE

## 2024-01-01 LAB — POC GLUCOSE
~~LOC~~ BKR POC GLUCOSE: 99 mg/dL (ref 70–100)
~~LOC~~ BKR POC GLUCOSE: 102 mg/dL — ABNORMAL HIGH (ref 70–100)
~~LOC~~ BKR POC GLUCOSE: 239 mg/dL — ABNORMAL HIGH (ref 70–100)
~~LOC~~ BKR POC GLUCOSE: 93 mg/dL (ref 70–100)

## 2024-01-01 LAB — POC HEMOCCULT - DIAGNOSTIC
LOT # POC HEMOCCULT: 53
POC HEMOCCULT: NEGATIVE

## 2024-01-01 LAB — CULTURE-URINE W/SENSITIVITY

## 2024-01-01 MED ORDER — PEG-ELECTROLYTE SOLN 420 GRAM PO SOLR
2 L | Freq: Two times a day (BID) | ORAL | 0 refills | Status: CP
Start: 2024-01-01 — End: ?
  Administered 2024-01-01: 22:00:00 2 L via ORAL

## 2024-01-01 MED ORDER — INSULIN GLARGINE 100 UNIT/ML (3 ML) SC INJ PEN
15 [IU] | Freq: Every day | SUBCUTANEOUS | 0 refills | Status: DC
Start: 2024-01-01 — End: 2024-01-03
  Administered 2024-01-02: 15:00:00 15 [IU] via SUBCUTANEOUS

## 2024-01-01 MED ORDER — BISACODYL 5 MG PO TBEC
10 mg | ORAL | 0 refills | Status: AC | PRN
Start: 2024-01-01 — End: ?

## 2024-01-01 MED ORDER — PEG-ELECTROLYTE SOLN 420 GRAM PO SOLR
2 L | ORAL | 0 refills | Status: CP | PRN
Start: 2024-01-01 — End: ?
  Administered 2024-01-02: 12:00:00 2 L via ORAL

## 2024-01-01 NOTE — Progress Notes [1]
 [facility] END OF SHIFT/ J[facility]FRAT NOTE    Admission Date: 12/31/2023    Acute events, interventions, provider communication: na    Patient Interventions and Education  Fall Risk/JHFRAT Interventions and Education: (Charting when applicable)  Total Fall Risk Score: Total Fall Risk Score: 8.  Elimination Interventions : Use of bladder management device (e.g., male/male external urinary containment device)   Medications : Educate patient on medication side effects  Patient Care Equipment: Ensure environment is free of clutter and walkways are clear from tripping hazards  Mobility: Utilize walker, cane, or additional walking aid for ambulation  Cognition: N/A  Risk for Moderate/Major Injury: Risk for fracture      2.   Active CBHP: No    3.   Restraints:  No       Intake and Output:      Intake/Output Summary (Last 24 hours) at 01/01/2024 0201  Last data filed at 01/01/2024 0000  Gross per 24 hour   Intake 0 ml   Output --   Net 0 ml            Last Bowel Movement Date: 12/31/23    Pt has ileal conduit, output 875 cc

## 2024-01-01 NOTE — Progress Notes [1]
 Inpatient Medicine General Progress Note      Name: Allen Gill        MRN: [id number]          DOB: [birth year]            Age: 33 y.o.  Admission Date: 12/31/2023       LOS: 1 day    Date of Service: 01/01/2024    Assessment/Plan:    Principal Problem:    GIB (gastrointestinal bleeding)    Allen Gill is a 33 y.o. 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      # Melena with concern for GIB   - Patient presented with few days of abdominal pain and new onset Melena   - He reports fresh blood when wiping after a bowel movement around 3 PM, which was accompanied by small blood clots.  - Pt denies NSAID use, alcohol use, Tobacco use   - No previous EGD or colonoscopy   - stable vitals on arrival with labs notable for leucocytosis   - CT A/P with Changes of cystectomy and urinary diversion without significant hydronephrosis or urinary tract calculi. 2.  Changes of colostomy/appendicostomy and small bowel anastomoses. No bowel obstruction. 3.  No acute findings by CT.    Plan:   > Admit patient to the floor   > IV Protonix  80 mg then 40 BID   > Consult GI   > CBCQ8  > Transfuse if hgb < 7      # Leucocytosis   # UTI   # Neurogenic bladder s/p ileal conduit and bilateral ureteral reattachment   - Pt with reported flank pain but no fever  - Empiric Ceftriaxone  with pending cultures      # Hypertension  # Heart failure with recovered EF  -Asymptomatic.  -Echo 11/05/2021 showed normal EF 50% with no regional wall motion abnormalities.  No significant valvular heart disease.  -Home medications: Coreg  6.25 mg twice daily, losartan  100 mg daily, spironolactone  25 mg daily and ezetimibe .  -Continue PTA Coreg  with hold parameters  > Hold losartan  and spironolactone  in setting of soft BP on admission and concern for GIB  >Continue ezetimibe .     # Diabetes mellitus  -Home regimen Lantus  26 units, metformin  and Mounjaro .  > Decreased Lantus  given NPO status and decrease PO intake.   > LDCF while inpatient.     # Chiari malformation  -Continue PTA levetiracetam  500 mg twice daily.     FEN NPO     VTE PPX Only SCDs     Dispo  - Admit to the floor        Code Status: Full Code                         Marylynn Candela, MD   Available by [facility] medical decision making due to the following:  1 or more chronic illness with progression  drug therapy requiring intensive monitoring for toxicity    _______________________________________________________________________    Subjective  Allen Gill is a 33 y.o. male.  Patient is feeling better this AM. Abdominal pain has improved. He reports plans for Colonoscopy tomorrow with GI. Will await their final recs but let him eat  some breakfast this morning. He is sitting up in the chair. Denies fever, chills. Has not had a BM yet today.     Medications  Scheduled Meds:carvedilol  (COREG ) tablet 12.5 mg, 12.5 mg, Oral, BID  insulin  aspart (U-100) (NOVOLOG  FLEXPEN U-100 INSULIN ) injection PEN 0-6 Units, 0-6 Units, Subcutaneous, ACHS (22)  [Held by Provider] insulin  glargine (LANTUS  SOLOSTAR U-100 INSULIN ) injection PEN 26 Units, 26 Units, Subcutaneous, QHS(22)  levETIRAcetam  (KEPPRA ) tablet 500 mg, 500 mg, Oral, BID  pantoprazole  (PROTONIX ) injection 40 mg, 40 mg, Intravenous, API(88-78)  rosuvastatin  (CRESTOR ) tablet 40 mg, 40 mg, Oral, QHS  [Held by Provider] spironolactone  (ALDACTONE ) tablet 50 mg, 50 mg, Oral, QDAY    Continuous Infusions:  PRN and Respiratory Meds:acetaminophen  Q4H PRN, dextrose  50% PRN, melatonin QHS PRN, ondansetron  Q6H PRN **OR** ondansetron  Q6H PRN, polyethylene glycol 3350  QDAY PRN, sennosides-docusate sodium  QDAY PRN          Objective:                          Vital Signs: Last Filed                 Vital Signs: 24 Hour Range   BP: 96/62 (12/21 0348)  Temp: 36.8 ?C (98.3 ?F) (12/21 9771)  Pulse: 81 (12/21 0347)  Respirations: 16 PER MINUTE (12/21 0228)  SpO2: 98 % (12/21 0228)  O2 Device: Nasal cannula (12/21 0228)  O2 Liter Flow: 1 Lpm (12/21 0228)  Height: 157.5 cm (5' 2) (12/20 2356) BP: (86-110)/(53-71)   Temp:  [36.7 ?C (98.1 ?F)-36.8 ?C (98.3 ?F)]   Pulse:  [81-113]   Respirations:  [14 PER MINUTE-23 PER MINUTE]   SpO2:  [92 %-98 %]   O2 Device: Nasal cannula  O2 Liter Flow: 1 Lpm   Intensity Pain Scale (Self Report): 7 (7.5) (01/01/24 0007)  Intensity Pain Scale (Self Report): 7 (7.5) (01/01/24 0007) Vitals:    12/31/23 2130 12/31/23 2356   Weight: 116.2 kg (256 lb 2.8 oz) 116.2 kg (256 lb 2.8 oz)         Intake/Output Summary:  (Last 24 hours)    Intake/Output Summary (Last 24 hours) at 01/01/2024 9177  Last data filed at 01/01/2024 0600  Gross per 24 hour   Intake 0 ml   Output 875 ml   Net -875 ml           Physical Exam  Physical Exam  HENT:      Head: Normocephalic and atraumatic.   Cardiovascular:      Rate and Rhythm: Normal rate.   Pulmonary:      Effort: No respiratory distress.   Abdominal:      General: There is no distension.   Skin:     General: Skin is warm and dry.   Neurological:      Mental Status: He is alert.          Lab Review  24-hour labs:    Results for orders placed or performed during the hospital encounter of 12/31/23 (from the past 24 hours)   CBC AND DIFF    Collection Time: 12/31/23  4:03 PM   Result Value Ref Range    White Blood Cells 14.50 (H) 4.50 - 11.00 10*3/uL    Red Blood Cells 5.17 4.40 - 5.50 10*6/uL    Hemoglobin 14.5 13.5 - 16.5 g/dL    Hematocrit 57.3 59.9 - 50.0 %    MCV 82.4 80.0 - 100.0 fL  MCH 28.0 26.0 - 34.0 pg    MCHC 34.0 32.0 - 36.0 g/dL    RDW 86.4 88.9 - 84.9 %    Platelet Count 366 150 - 400 10*3/uL    MPV 8.8 7.0 - 11.0 fL    Neutrophils 71.2 41.0 - 77.0 %    Lymphocytes 20.6 (L) 24.0 - 44.0 %    Monocytes 6.4 4.0 - 12.0 %    Eosinophils 1.5 0.0 - 5.0 % Basophils 0.3 0.0 - 2.0 %    Absolute Neutrophil Count 10.30 (H) 1.80 - 7.00 10*3/uL    Absolute Lymph Count 3.00 1.00 - 4.80 10*3/uL    Absolute Monocyte Count 0.90 (H) 0.00 - 0.80 10*3/uL    Absolute Eosinophil Count 0.20 0.00 - 0.45 10*3/uL    Absolute Basophil Count 0.00 0.00 - 0.20 10*3/uL    MDW (Monocyte Distribution Width) 21.0 (H) <=20.6   COMPREHENSIVE METABOLIC PANEL    Collection Time: 12/31/23  4:03 PM   Result Value Ref Range    Sodium 135 (L) 137 - 147 mmol/L    Potassium 4.5 3.5 - 5.1 mmol/L    Chloride 100 98 - 110 mmol/L    Glucose 95 70 - 100 mg/dL    Blood Urea Nitrogen 20 7 - 25 mg/dL    Creatinine 9.31 9.59 - 1.24 mg/dL    Calcium  9.2 8.5 - 10.6 mg/dL    Total Protein 7.6 6.0 - 8.0 g/dL    Total Bilirubin 0.3 0.2 - 1.3 mg/dL    Albumin  4.1 3.5 - 5.0 g/dL    Alk Phosphatase 73 25 - 110 U/L    AST 47 (H) 7 - 40 U/L    ALT 91 (H) 7 - 56 U/L    CO2 22 21 - 30 mmol/L    Anion Gap 13 (H) 3 - 12    Glomerular Filtration Rate (GFR) >60 >60 mL/min   LIPASE    Collection Time: 12/31/23  4:03 PM   Result Value Ref Range    Lipase 60 11 - 82 U/L   POC CREATININE    Collection Time: 12/31/23  4:20 PM   Result Value Ref Range    Creatinine, POC 0.8 0.4 - 1.24 mg/dL   POC LACTATE    Collection Time: 12/31/23  4:21 PM   Result Value Ref Range    LACTIC ACID POC 1.8 0.5 - 2.0 mmol/L   URINALYSIS DIPSTICK REFLEX TO CULTURE    Collection Time: 12/31/23  4:38 PM    Specimen: Midstream; Urine   Result Value Ref Range    Color,UA Yellow     Turbidity,UA 2+ (A) Clear    Specific Gravity-Urine 1.034 (H) 1.005 - 1.030    pH,UA 6.0 5.0 - 8.0    Protein,UA 3+ (A) Negative    Glucose,UA Negative Negative    Ketones,UA Negative Negative    Bilirubin,UA Negative Negative    Blood,UA 2+ (A) Negative    Urobilinogen,UA Normal Normal    Nitrite,UA Negative Negative    Leukocytes,UA Negative Negative   URINALYSIS MICROSCOPIC REFLEX TO CULTURE    Collection Time: 12/31/23  4:38 PM    Specimen: Midstream; Urine   Result Value Ref Range    WBCs,UA 20 - 50 (A) None, 0 - 2  /HPF    RBCs,UA Packed (A) None, 0 - 2  /HPF    Mucous,UA 1+ (A) None, Trace /LPF    Bacteria,UA Few (A) None /HPF    Squamous Epithelial Cells 0 -  2 None, 0 - 2 , 2 - 5 /HPF   POC HEMOCCULT - DIAGNOSTIC    Collection Time: 12/31/23  8:31 PM   Result Value Ref Range    POC Hemoccult Negative     POC Hemoccult Quality Control      Lot Number POC Hemoccult [id number] 08-26    POC GLUCOSE    Collection Time: 12/31/23  9:57 PM   Result Value Ref Range    Glucose, POC 99 70 - 100 mg/dL   CBC AND DIFF    Collection Time: 01/01/24  3:15 AM   Result Value Ref Range    White Blood Cells 10.60 4.50 - 11.00 10*3/uL    Red Blood Cells 4.78 4.40 - 5.50 10*6/uL    Hemoglobin 13.7 13.5 - 16.5 g/dL    Hematocrit 60.3 (L) 40.0 - 50.0 %    MCV 82.8 80.0 - 100.0 fL    MCH 28.7 26.0 - 34.0 pg    MCHC 34.7 32.0 - 36.0 g/dL    RDW 86.1 88.9 - 84.9 %    Platelet Count 300 150 - 400 10*3/uL    MPV 8.4 7.0 - 11.0 fL    Neutrophils 61.0 41.0 - 77.0 %    Lymphocytes 29.5 24.0 - 44.0 %    Monocytes 7.4 4.0 - 12.0 %    Eosinophils 1.7 0.0 - 5.0 %    Basophils 0.4 0.0 - 2.0 %    Absolute Neutrophil Count 6.50 1.80 - 7.00 10*3/uL    Absolute Lymph Count 3.10 1.00 - 4.80 10*3/uL    Absolute Monocyte Count 0.80 0.00 - 0.80 10*3/uL    Absolute Eosinophil Count 0.20 0.00 - 0.45 10*3/uL    Absolute Basophil Count 0.00 0.00 - 0.20 10*3/uL   COMPREHENSIVE METABOLIC PANEL    Collection Time: 01/01/24  3:15 AM   Result Value Ref Range    Sodium 138 137 - 147 mmol/L    Potassium 4.4 3.5 - 5.1 mmol/L    Chloride 102 98 - 110 mmol/L    Glucose 89 70 - 100 mg/dL    Blood Urea Nitrogen 20 7 - 25 mg/dL    Creatinine 9.19 9.59 - 1.24 mg/dL    Calcium  8.7 8.5 - 10.6 mg/dL    Total Protein 7.0 6.0 - 8.0 g/dL    Total Bilirubin 0.4 0.2 - 1.3 mg/dL    Albumin  3.8 3.5 - 5.0 g/dL    Alk Phosphatase 58 25 - 110 U/L    AST 35 7 - 40 U/L    ALT 79 (H) 7 - 56 U/L    CO2 25 21 - 30 mmol/L    Anion Gap 11 3 - 12    Glomerular Filtration Rate (GFR) >60 >60 mL/min   PROTIME INR (PT)    Collection Time: 01/01/24  3:15 AM   Result Value Ref Range    Protime 12.4 9.9 - 14.2 Seconds    INR 1.1 0.9 - 1.2   POC GLUCOSE    Collection Time: 01/01/24  7:42 AM   Result Value Ref Range    Glucose, POC 102 (H) 70 - 100 mg/dL   POC GLUCOSE    Collection Time: 01/01/24 11:07 AM   Result Value Ref Range    Glucose, POC 93 70 - 100 mg/dL       Point of Care Testing  (Last 24 hours)  Glucose: 89 (01/01/24 0315)  POC Glucose (Download): (!) 102 (01/01/24 9257)    Radiology  and other Diagnostics Review:    Pertinent radiology reviewed.    Marylynn CHRISTELLA Candela, MD

## 2024-01-01 NOTE — Care Coordination-Inpatient [600030]
 Med Private Night 5 [phone] will take calls on this patient until 8:00 AM. Afterwards, please contact first on call for the designated service listed on chart.         AOD

## 2024-01-01 NOTE — Consults [2]
 GASTROENTEROLOGY CONSULT NOTE     Patient Name:Allen Gill         FMW:[id number]    Admission Date: 12/31/2023  2:45 PM    Admission diagnosis: GIB (gastrointestinal bleeding) [K92.2]     Referring Provider:    Principal Problem:    GIB (gastrointestinal bleeding)      LOS: 1 Day     Gastroenterology has been consulted for GI bleed    ASSESSMENT AND PLAN     ACTIVE PROBLEMS:      Lower GI bleed-hematochezia  Chronic constipation  History of Arnold-Chiari malformation status post VP shunt  Heart failure with recovered ejection fraction  Neurogenic bladder with ileal conduit  History of prior fundoplication      Allen Gill is a 33 y.o. male was admitted to [facility] on 12/31/2023 for complaints of left-sided abdominal pain associated with hematochezia.  Gastroenterology has been consulted for the same.    Past Medical History significant for Arnold-Chiari malformation status post VP shunt, neurogenic bladder status post ileal conduit and bilateral ureteral reattachment, dyslipidemia, type 2 diabetes mellitus, hydrocephalus status post VP shunt.    Currently presents with 1-2 episodes of small-volume hematochezia since yesterday.  No history of any prior episodes of hematochezia.  No history of any melena or hematemesis.  Patient also has significant abdominal pain for the past 4 to 5 days.  No history of any blood thinners or NSAID intake.  Prior history of cecostomy for possible chronic constipation and obstruction.  Prior history of fundoplication.     Relevant investigations:  Hemoglobin on presentation remains around 13 to 14 g/dL at baseline.  WBC and platelet PT/INR within normal limits.  Blood urea nitrogen and creatinine within normal limits.  Mild elevation of ALTs noted.  Lipase within normal limits.  CT of the abdomen and pelvis with contrast-12/31/2023: Shows changes of cystectomy and urinary diversion without significant hydronephrosis or urinary tract calculi.  Changes of colostomy/appendicostomy and small bowel anastomosis.  No bowel obstruction noted.  Suture material noted at GE junction representing prior fundoplication.  Right abdominal colostomy/appendicostomy.    Vitals remained stable.  Patient is currently on pantoprazole  40 mg IV twice daily, rosuvastatin , Keppra .    ASSESSMENT  33 year old gentleman with past medical history signal for Arnold-Chiari malformation, neurogenic bladder, prior history of chronic constipation, type 2 diabetes mellitus currently presents with small-volume hematochezia associated with intermittent abdominal pain.  Labs remain unremarkable.  He was unremarkable.    Differentials include hemorrhoids versus malignancy.    RECOMMENDATIONS:  Patient has been tentatively planned for colonoscopy on 01/02/2024  Kindly keep the patient n.p.o.[ midnight prior to the procedure].  Monitor hemoglobin every 8 hourly. In case of significant hemoglobin drop/ brisk GI bleed/ hemodynamic instability kindly page the GI fellow on call.  Transfuse to keep hemoglobin more than 7 g/dL [more than 8 g/dL in case of cardiac disease], platelets more than 50,000, INR less than 2  Kindly normalize electrolytes prior to procedure  Withhold all anticoagulation as applicable  Split dose bowel preparation on 01/02/2024  Clear liquid diet today  GI will continue to follow.  Kindly call us  with any questions     Plan of care has been communicated to the patient.The benefits and risks of the procedure has been clearly explained along with alternatives.  They voiced understanding and all questions have been answered.      Olean Sangster Vithya Romelia Dykes, MD  PGY - 4 Fellow, Division of Gastroenterology,  Hepatology & Motility  Available on Voalte me   01/01/2024 5:53 AM     Patient seen/discussed with staff Dr.Swaminathan. Kindly see their addendum for any changes.  ---------------------------------------------------------------------------------------------------------------------------------------------------------------------------------  HISTORY OF PRESENTING ILLNESS       Allen Gill is a 33 y.o. male was admitted to [facility] on 12/31/2023 for complaints of left-sided abdominal pain associated with hematochezia.  Gastroenterology has been consulted for the same.    Past Medical History significant for Arnold-Chiari malformation status post VP shunt, neurogenic bladder status post ileal conduit and bilateral ureteral reattachment, dyslipidemia, type 2 diabetes mellitus, hydrocephalus status post VP shunt.    Currently presents with 1-2 episodes of small-volume hematochezia since yesterday.  No history of any prior episodes of hematochezia.  No history of any melena or hematemesis.  Patient also has significant abdominal pain for the past 4 to 5 days.  No history of any blood thinners or NSAID intake.  Prior history of cecostomy for possible chronic constipation and obstruction.  Prior history of fundoplication.     Past medical history significant for attention deficit disorder, Arnold-Chiari malformation, type 2 diabetes mellitus, cardiomyopathy, VP shunt, obstructive sleep apnea, spina bifida, prior history of UTI.  Past GI surgical history is significant for fundoplication in the past, cecostomy in 2003.  No family history of colon cancer noted.  Denies any significant use of alcohol, tobacco or substance use.  No prior use of blood thinners or NSAIDs.    Relevant Home medications include carvedilol , ezetimibe , insulin , loperamide , Keppra , losartan , metformin , rosuvastatin , spironolactone , Mounjaro , Vascepa     PMH:  Past Medical History:    ADD (attention deficit disorder)    Allergy    Arnold-Chiari malformation (CMS-HCC)    Cardiomyopathy (CMS-HCC)    Diabetes mellitus (CMS-HCC)    Headache(784.0)    Hydrocephalus (CMS-HCC)    Hypertension Infection of VP shunt    Kidney stones    Lung disease    Myelomeningocele (CMS-HCC)    Neurogenic bladder    OSA (obstructive sleep apnea)    Preop cardiovascular exam    Seizures (CMS-HCC)    Shunt malfunction    Spina bifida (CMS-HCC)    Tachycardia    Urinary retention    Urinary tract infection       Current medications:  Medications Ordered Prior to Encounter[1]    PSH:  Surgical History:   Procedure Laterality Date    SHUNT CREATION VENTRICULAR PERITONEAL Right 06/14/2014    Performed by Idelia Lango, MD at [facility] OR    SHUNT REVISION VENTRICULAR PERITONEAL Right 04/23/2015    Performed by Idelia Lango, MD at [facility] OR    REMOVAL HARDWARE HEAD: removal of left ventriculopleural shunt Left 05/20/2015    Performed by Idelia Lango, MD at [facility] OR    SHUNT REMOVAL VENTRICULAR PERITONEAL Right 05/23/2015    Performed by Idelia Lango, MD at [facility] OR    SHUNT CREATION VENTRICULAR PERITONEAL Left 06/03/2015    Performed by Idelia Lango, MD at [facility] OR    CYSTOURETHROSCOPY, CYSTOLITHOLAPAXY N/A 08/05/2015    Performed by Ronold Rubin, MD at [facility] OR    HOLMIUM LASER ENUCLEATION OF PROSTATE (NO MORCELLATION) N/A 08/19/2015    Performed by Gwenn Kirke DASEN, MD at [facility] OR    CYSTOSCOPY N/A 08/19/2015    Performed by Gwenn Kirke DASEN, MD at [facility] OR    CYSTOSCOPY EVACUATION CLOTS N/A 08/29/2015    Performed by Gwenn Kirke DASEN, MD at [facility] OR  CYSTOSCOPY, URETHERAL DILATION N/A 02/20/2016    Performed by Gwenn Kirke DASEN, MD at [facility] OR    CYSTECTOMY, ILEAL CONDUIT N/A 10/13/2016    Performed by Carolynn Silversmith, MD at [facility] OR    REVISION SHUNT - VENTRICULO-PERITONEAL Left 12/16/2016    Performed by Vivica Domino, MD at [facility] OR    CREATION SHUNT - VENTRICULO-PERITONEAL: Left side. 2 hours, proximal catheter and valve in place. will need distal catheter placed, Dr. Shirlean to complete. Supine Left 12/22/2016    Performed by Idelia Lango, MD at [facility] OR    PR [facility] PROGRAMMABLE CEREBROSPINAL SHUNT  01/07/2017    PERCUTANEOUS NEPHROSTOLITHOTOMY/ PYELOSTOLITHOTOMY - GREATER THAN 2 CM Left 05/25/2017    Performed by Lenette Blush, MD, [facility] OR    CREATION SHUNT - VENTRICULO-PERITONEAL Right 08/04/2017    Performed by Idelia Lango, MD at [facility] OR    PERCUTANEOUS NEPHROSTOLITHOTOMY/ PYELOSTOLITHOTOMY - 2 CM OR LESS Right 06/07/2019    Performed by Lenette Blush SAUNDERS, MD at [facility] OR    CYSTOURETHROSCOPY WITH URETEROSCOPY AND/ OR PYELOSCOPY - WITH REMOVAL/ MANIPULATION CALCULUS Right 06/07/2019    Performed by Lenette Blush SAUNDERS, MD at [facility] OR    URETERONEOCYSTOSTOMY - VESICO-PSOAS HITCH/ BLADDER FLAP Right 04/28/2022    Performed by Carolynn Silversmith ORN, MD at [facility] OR    CATHETER IMPLANT/REVISION  12/27/09    distal end of the catheter was revised    CATHETER IMPLANT/REVISION  01/31/10    replacement of ventricular catheter with [employer] framelessstereotaxis catheter    HX ABDOMEN SURGERY      fundoplication    HX ABDOMEN SURGERY  01/2001    Cecostomy    HX BACK SURGERY      repair of spina bifida    HX BRAIN SURGERY  5 months old    Chiari decompression    HX EAR TUBES      HX HERNIA REPAIR      inguinal hernia    HX SURGERY  at 2 weeks    VP Shunt    HX TONSILLECTOMY      HX TRACHEOSTOMY      removed at age 47    SHUNT REVISION  58 months old    SHUNT REVISION  33 years old    SHUNT REVISION  October 2010    [place]    SHUNT REVISION  12/11/09    replacment of valve to acodman hakem adjustablevalve    SHUNT REVISION  02/17/10    left frontal ventriculopleural shunt    VENTRICULOSTOMY  02/03/10    removal of all shunt components and placementofright frontal ventriculostomy       SH:  Social History     Socioeconomic History    Marital status: Single   Occupational History    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     Employer: STUDENT   Tobacco Use    Smoking status: Never    Smokeless tobacco: Never   Vaping Use    Vaping status: Never Used   Substance and Sexual Activity    Alcohol use: Never    Drug use: Never    Sexual activity: Not Currently     Partners: Female       FH:  Family History Problem Relation Name Age of Onset    Diabetes Father Azzam Mehra     Hypertension Father Ellis Mehaffey     Other Father Huck Ashworth         glaucoma    High Cholesterol Maternal  Grandfather Remmel Conyers        Review of Systems:  Please see HPI for additional pertinent documentation    OBJECTIVE:     Physical Exam:  Vitals:    01/01/24 0015 01/01/24 0228 01/01/24 0347 01/01/24 0348   BP:  (!) 86/70 96/62 96/62    BP Source:  Arm, Right Upper     Pulse: 96 88 81    Temp:  36.8 ?C (98.3 ?F)     SpO2: 95% 98%     O2 Device: None (Room air) Nasal cannula     O2 Liter Flow:  1 Lpm     Weight:       Height:           General appearance  alert, cooperative, no distress,    Head  Normocephalic, atraumatic   Eyes  conjunctivae/corneas clear.    Throat Lips, mucosa, and tongue normal. Teeth and gums normal   Neck supple, symmetrical, trachea midline, and no JVD   Lungs   clear to auscultation bilaterally   Heart  regular rate and rhythm, S1, S2 normal, no murmur, click, rub or gallop   Abdomen   soft, non-tender. Bowel sounds normal. No masses,  No organomegaly   Extremities no cyanosis or edema   Skin Skin color, texture, turgor normal. No rashes or lesions   Neurologic Normal     IMAGING AND OTHER PERTINENT LABS REVIEWED IN EPIC.    Voice recognition software was used for this dictation and grammatorical errors may arise despite review. Feel free to contact me for any clarifications.          [1]   No current facility-administered medications on file prior to encounter.     Current Outpatient Medications on File Prior to Encounter   Medication Sig Dispense Refill    acetaminophen  (TYLENOL  EXTRA STRENGTH) 500 mg tablet Take two tablets by mouth every 6 hours as needed for Pain. Max of 4,000 mg of acetaminophen  in 24 hours.  Indications: pain      blood-glucose sensor (FREESTYLE LIBRE 3 PLUS SENSOR) sensor device Use to check blood sugars continuously. Replace every 15 days. 2 each 11    carvediloL  (COREG ) 6.25 mg tablet take 2 tablets BY MOUTH TWICE DAILY WITH MEALS 180 tablet 3    cholecalciferol (+) (VITAMIN D3) 2,000 unit tablet Take one tablet by mouth daily. 90 tablet 0    cyanocobalamin  (vitamin B-12) 3,000 mcg cap Take one capsule by mouth daily.      ezetimibe  (ZETIA ) 10 mg tablet TAKE 1 TABLET BY MOUTH once daily 90 tablet 3    insulin  glargine-yfgn (SEMGLEE  U-100) 100 unit/mL (3 mL) injectable PEN Inject twenty six Units under the skin every morning. Indications: type 2 diabetes mellitus 15 mL 3    insulin  pen needles (disposable) (COMFORT EZ PEN NEEDLES) 31 gauge x 5/16 pen needle Use to inject insulin  1x/day 100 each 3    Leg Brace misc Bilateral Fitted leg brace and right shoe for foot drop.  Dx: spina bifida. 1 each 0    levETIRAcetam  (KEPPRA ) 500 mg tablet Take one tablet by mouth twice daily. 180 tablet 3    loperamide  (IMODIUM  A-D) 2 mg capsule Take one capsule by mouth as Needed for Diarrhea.      losartan  (COZAAR ) 50 mg tablet Take 1 tablet by mouth daily 90 tablet 3    metFORMIN -XR (GLUCOPHAGE  XR) 500 mg extended release tablet TAKE 1 TABLET BY MOUTH IN THE MORNING AND 2 tablets  IN THE EVENING with meals 270 tablet 1    multivit-min/folic/vit K/lycop (ONE-A-DAY MEN'S MULTIVITAMIN PO) Take 1 tablet by mouth daily.      psyllium seed (with sugar) (FIBER PO) Take 1 tablet by mouth twice daily.      rosuvastatin  (CRESTOR ) 40 mg tablet TAKE 1 TABLET BY MOUTH at bedtime 90 tablet 3    spironolactone  (ALDACTONE ) 25 mg tablet take 2 tablets BY MOUTH ONCE daily WITH FOOD 180 tablet 1    tirzepatide  (MOUNJARO ) 12.5 mg/0.5 mL injector PEN Inject 0.5 mL under the skin every 7 days. 2 mL 5    VASCEPA  1 gram capsule TAKE TWO CAPSULES BY MOUTH TWICE DAILY WITH MEALS 360 capsule 1

## 2024-01-01 NOTE — Care Plan [600008]
 Problem: Glucose Management  Goal: Absence of hyperglycemia  Outcome: Goal Ongoing  Goal: Absence of Hypoglycemia  Outcome: Goal Ongoing  Goal: Glucose level within specified parameters  Outcome: Goal Ongoing     Problem: Moderate Fall Risk  Goal: Moderate Fall Risk  Outcome: Goal Ongoing     Problem: Pain  Goal: Management of pain  Outcome: Goal Ongoing  Goal: Knowledge of pain management  Outcome: Goal Ongoing  Goal: Progress Toward Pain Management Goals  Outcome: Goal Ongoing

## 2024-01-01 NOTE — Case Mgmt DC Plan [600024]
 Case Management 30 Day Readmission Assessment      NAME: Allen Gill                    MRN: [id number]              DOB: [birth year]          AGE: 33 y.o.  ADMISSION DATE: 12/31/2023             DAYS ADMITTED: LOS: 1 day    Today's Date: 01/01/2024    Source of Information: EMR    Allen Gill is a 33 y.o. 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     Expected Discharge Date:  01/03/2024     Plan:  Anticipate d/c pending medical stability.     Previous Inpatient Discharge Date:    12/21/23    Previous Case Management admission assessment dated 12/20/23 is copied at the end of this note for reference and was reviewed with patient/family.  Changes to information from previous admission assessment are: none.     Acute Hospital Stay:  Acute Hospital Stay: Yes  Was patient's stay within the last 30 days?: Yes  [facility] of Hospital: [facility]  When did patient receive care?: 12/19/23-12/21/23  Readmission Code Group: 9. Unrelated Readmision    Prior Living Situation/Admitted from?   Type of Residence: Home, dependent on others    Post-Acute Services/DME Last Discharge:  Were post-acute services/DME arranged at previous disharge?: No    Lack of services:  At or after previous discharge, what did the patient/family feel they lacked?: Patient/family felt discharge services were adequate    Previous Discharge Resources Provided:  Were resources provided to patient at previous discharge in person or within AVS?: No    Social Determinants of Health (SDOH):          Transportation:  Does the Patient Need Case Management to Arrange Discharge Transport? (ex: facility, ambulance, wheelchair/stretcher, Medicaid, cab, other): No  Will the Patient Use Family Transport?: Yes      Wyvonna Gaster, LSCSW, LCSW  Inpatient Social Work Case Manager  Med Onc, MPP  Available on Voalte   Work Cell: [phone]      Case Management Admission Assessment     NAME:Allen Gill                          MRN: [id number]             DOB:[birth year]          AGE: 33 y.o.  ADMISSION DATE: 12/19/2023             DAYS ADMITTED: LOS: 0 days      Today?s Date: 12/20/2023     Source of Information: EMR and patient      Plan  This CM met with pt for assessment on this date.  Provided contact information and explanation of SW/NCM roles.  Reviewed [facility] and Preparing for Discharge hand-outs.  Provided opportunity for questions and discussion. Pt/family encouraged to contact Case Management team with questions and concerns during hospitalization and until patient is able to transition back to the patient's primary care physician.     Assessment Notes:     Pt resides in ground  level apartment-level alone with zero steps to entry.     Pt lives alone.     Pt is dependent with the following ADLs: transportation, medication management, household chores and meal prep.     Pt is disabled.     Pt utilizes bilat leg braces, glucometer, quad cane, shower chair, walker and illeal conduit supplies provided by [employer], BP monitor DME.      Pt's previous history of [facility], and would return to [facility] and [facility].     PCP: Current with Dr. Harlene Dustman.     DC transportation will be provided by: brother Ludie [phone].     Case Management Needs:     No NCM needs identified.   Pt anticipated to discharge home on oral antibiotics.   Will continue to follow.               Patient Address/Phone  [address] [place] [address]  [phone] (home)      Emergency Contact  Extended Emergency Contact Information  Primary Emergency Contact: Gurka,Ann  Address: [address] [place], [place] [address] [place]   Home Phone: [phone]  Mobile Phone: [phone]  Relation: Mother  Preferred language: ENGLISH  Interpreter needed? No  Secondary Emergency Contact: Comyers,Ed  Home Phone: [phone]  Mobile Phone: [phone]  Relation: Relative  Preferred language: ENGLISH  Interpreter needed? No     Forensic Scientist: Yes, patient has a healthcare directive  Type of Healthcare Directive: Durable power of attorney for healthcare  Location of Healthcare Directive: Patient does not have it with him/her  Would patient like to fill out a (a new) Healthcare Directive?: No, patient declined  Psych Advance Directive (Psych unit only): No, patient does not have a Biomedical Scientist  Does the Patient Need Case Management to Arrange Discharge Transport? (ex: facility, ambulance, wheelchair/stretcher, Medicaid, cab, other): No  Will the Patient Use Family Transport?: Yes  Transportation Name, Phone and Availability #1: Brother Ludie [phone]     Expected Discharge Date  12/21/2023      Living Situation Prior to Admission  Living Arrangements  Type of Residence: Home, dependent on others  Living Arrangements: Alone  Financial Risk Analyst / Tub: Tub Only  How many levels in the residence?: 1  Can patient live on one level if needed?: Yes  Does residence have entry and/or inside stairs?: No  Assistance needed prior to admit or anticipated on discharge: Yes  Who provides assistance or could if needed?: Caregiver Glory Shan [phone])  Are they in good health?: Yes  Can support system provide 24/7 care if needed?: Maybe  Level of Function   Prior level of function: Needs assist with ADLs  Which ADLs require assistance?: Transportation, medication management, meal prep, household chores  Who assists with ADLs?: Caregiver Glory and family  Cognitive Abilities   Cognitive Abilities: Estate Agent Resources  Coverage  Primary Insurance: Medicare (A B)  Secondary Insurance: Medicaid  Medicaid State:   Herbalist)  Additional Coverage: None  Medication Coverage    Medication Coverage: No insurance  Have you experienced a noticeable increase in your copay costs recently?: No  Are current medications affordable?: Yes  Do You Use a Co-Pay Card or a Medication Assistance Program to Help Manage Medication Costs?: No  Do  You Manage Your Own Medications?: No  Who is Responsible for Ordering and Setting up Medications?: Caregiver Materials Engineer of Income   Source Of Income: SSDI  Financial Assistance Needed?        Psychosocial Needs  Mental Health  Mental Health History: No  Substance Use History  Substance Use History Screen: No  Other        Current/Previous Services  PCP  Bethena Harlene LABOR, [phone], [phone]  Pharmacy     [id number] - [place], [place] - [address]  [address]  [place] [address]  Phone: [phone] Fax: [phone]     [facility]  [address]  [place]  [place] [address]  Phone: [phone] Fax: [phone]     [facility]  [address] [place] [address]  Phone: [phone] Fax: [phone]     Durable Medical Equipment   Durable Medical Equipment at home: Carlis Finder, Shower Chair, Duncan, Grab bars, Other (comment) (leg braces, glucometer, illeal conduit)  Home Health  Receiving home health: In the past  Agency name: [facility]  Would patient use this agency again?: Yes  Hemodialysis or Peritoneal Dialysis  Undergoing hemodialysis or peritoneal dialysis: No  Tube/Enteral Feeds  Receive tube/enteral feeds: In the past (As a child)  Infusion  Receive infusions: In the past  Infusion company: [facility]  Would patient use this agency again?: Yes  Private Duty  Private duty help used: No  Home and Community Based Services  Home and community based services: Yes  HCBS assistance hours/week: 20hrs/week  Provider Schedule: 4 hrs/day/varies  Services provided: transportation, meal prep, household chores, medication management  Ryan White  Ryan White: N/A  Hospice  Hospice: No  Outpatient Therapy  PT: No  OT: No  SLP: No  Skilled Nursing Facility/Nursing Home  SNF: No  NH: No  Inpatient Rehab  IPR: In the past  Name of Facility: [facility]  Would patient return for future services?: Yes  Long-Term Acute Care Hospital  LTACH: No  Acute Hospital Stay  Acute Hospital Stay: In the past  Was patient's stay within the last 30 days?: No        Randall Her BSN, Museum/gallery Conservator Case Manager  [phone]  [place]

## 2024-01-01 NOTE — Progress Notes [1]
 CA8 END OF SHIFT/ JHFRAT NOTE    Admission Date: 12/31/2023    Acute events, interventions, provider communication: n/a    Patient Interventions and Education  Fall Risk/JHFRAT Interventions and Education: (Charting when applicable)  Total Fall Risk Score: Total Fall Risk Score: 6.  Elimination Interventions : Commode at bedside, Use of bladder management device (e.g., male/male external urinary containment device) , and Communicate timing of laxatives/diuretics with assistive personnel to support proactive elimination needs.   Medications : Bedside commode (i.e., urgency, frequency, dizziness)  and Educate patient on medication side effects  Patient Care Equipment: Ensure environment is free of clutter and walkways are clear from tripping hazards  Mobility: Assist x1 and Utilize walker, cane, or additional walking aid for ambulation  Cognition: N/A  Risk for Moderate/Major Injury: Risk for fracture      2.   Active CBHP: No    3.   Restraints:  No       Intake and Output:      Intake/Output Summary (Last 24 hours) at 01/01/2024 2355  Last data filed at 01/01/2024 2346  Gross per 24 hour   Intake 2000 ml   Output 2525 ml   Net -525 ml            Last Bowel Movement Date: 01/01/24

## 2024-01-01 NOTE — Progress Notes [1]
 RT Adult Assessment Note    NAME:Braxton Sergi Gellner             MRN: [id number]             DOB:March 14, 1990          AGE: 33 y.o.  ADMISSION DATE: 12/31/2023             DAYS ADMITTED: LOS: 1 day    Additional Comments:  Impressions of the patient: NAD on RA. Will put on 1L NC instead of a CPAP as patient does not wear a CPAP at home nor does he want a hospital CPAP  Intervention(s)/outcome(s): Ordering O2 and POX  Patient education that was completed: n/a  Recommendations to the care team: n/a    Vital Signs:  Pulse: 96  RR: 18 PER MINUTE  SpO2: 95 %  O2 Device: None (Room air)  Liter Flow:    O2%:      Breath Sounds:   Right Apex Breath Sounds: Clear (Implies normal)  Right Base Breath Sounds: Decreased  Left Apex Breath Sounds: Clear (Implies normal)  Left Base Breath Sounds: Decreased  Respiratory Effort:   Respiratory WDL: Within Defined Limits  Respiratory Effort/Pattern: Unlabored  Comments: Refuses OSA

## 2024-01-02 ENCOUNTER — Inpatient Hospital Stay: Admit: 2024-01-02 | Discharge: 2024-01-02 | Payer: MEDICARE

## 2024-01-02 ENCOUNTER — Encounter: Admit: 2024-01-02 | Discharge: 2024-01-02 | Payer: MEDICARE

## 2024-01-02 VITALS — BP 117/66 | HR 101 | Temp 98.50000°F | Ht 62.0 in | Wt 256.2 lb

## 2024-01-02 LAB — POC GLUCOSE
~~LOC~~ BKR POC GLUCOSE: 119 mg/dL — ABNORMAL HIGH (ref 70–100)
~~LOC~~ BKR POC GLUCOSE: 104 mg/dL — ABNORMAL HIGH (ref 70–100)
~~LOC~~ BKR POC GLUCOSE: 108 mg/dL — ABNORMAL HIGH (ref 70–100)
~~LOC~~ BKR POC GLUCOSE: 137 mg/dL — ABNORMAL HIGH (ref 70–100)
~~LOC~~ BKR POC GLUCOSE: 88 mg/dL (ref 70–100)
~~LOC~~ BKR POC GLUCOSE: 89 mg/dL (ref 70–100)

## 2024-01-02 LAB — COLONOSCOPY

## 2024-01-02 MED ORDER — LACTATED RINGERS IV SOLP
INTRAVENOUS | 0 refills | Status: DC
Start: 2024-01-02 — End: 2024-01-02
  Administered 2024-01-02: 21:00:00 1000.0000 mL via INTRAVENOUS

## 2024-01-02 MED ORDER — ACETAMINOPHEN 500 MG PO TAB
1000 mg | Freq: Once | ORAL | 0 refills | Status: CP
Start: 2024-01-02 — End: ?
  Administered 2024-01-02: 22:00:00 1000 mg via ORAL

## 2024-01-02 MED ORDER — METHOCARBAMOL 500 MG PO TAB
1000 mg | Freq: Once | ORAL | 0 refills | Status: CP
Start: 2024-01-02 — End: ?
  Administered 2024-01-02: 23:00:00 1000 mg via ORAL

## 2024-01-02 MED ORDER — PHENYLEPHRINE HCL IN 0.9% NACL 1 MG/10 ML (100 MCG/ML) IV SYRG
INTRAVENOUS | 0 refills | Status: DC
Start: 2024-01-02 — End: 2024-01-02

## 2024-01-02 MED ORDER — PROPOFOL 10 MG/ML IV EMUL 50 ML (INFUSION)(AM)(OR)
INTRAVENOUS | 0 refills | Status: DC
Start: 2024-01-02 — End: 2024-01-02

## 2024-01-02 MED ORDER — LIDOCAINE (PF) 20 MG/ML (2 %) IJ SOLN
INTRAVENOUS | 0 refills | Status: DC
Start: 2024-01-02 — End: 2024-01-02

## 2024-01-02 NOTE — Progress Notes [1]
 PHYSICAL THERAPY  ASSESSMENT      Name: Allen Gill   MRN: [id number]     DOB: [birth year]      Age: 33 y.o.  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  Mental / Cognitive: Alert;Oriented;Cooperative;Follows commands  Pain: No complaint of pain  Persons Present: Nursing Staff    Home Living Situation  Lives With: Fish Farm Manager (caregiver M-F 2hours day for IADLs)  Type of Home: Apartment  Entry Stairs: No stairs  In-Home Stairs: No stairs  Patient Owned Equipment: CanePatent Attorney with wheels;Handheld shower head;Bathing: Shower chair  Comments: Bilateral AFOs    Prior Level of Function  Level Of Independence: Independent with ADL and community mobility with device (quad cane)  History of Falls in Past 3 Months: No  Occupation/Education: Disability/disabled    ROM  R UE ROM: WFL   R UE ROM Method: Active  L UE ROM: WFL   L UE ROM Method: Active  R LE ROM: WFL  R LE ROM Method: Active  L LE ROM: WFL  L LE ROM Method: Active    Strength  Overall Strength: WFL;No focal deficits noted    Bed Mobility/Transfer  Bed Mobility: Supine to Sit: Modified Independent;Head of Bed Elevated  Bed Mobility: Sit to Supine: Modified Independent;HOB Elevated  Transfer Type: Sit to/from Stand (multiple reps, various surfaces)  Transfer: Assistance Level: To/From;Bed;Toilet;Standby Assist  Transfer: Assistive Device: [employer] Insurance  Transfers: Type Of Assistance: For Balance;For Safety Considerations  End Of Activity Status: In Bed (bed alarm activated)    Balance  Sitting Balance: Independent  Standing Balance: Standby Assist    Gait  Gait Distance: 15 feet (x2 bouts, to/from bathroom)  Gait: Assistance Level: Standby Assist;Safety Considerations  Gait: Assistive Device: [employer] Insurance  Gait: Descriptors: Pace: Futures Trader;No balance loss;Decreased step length  Comments: Patient didn't put on his bilateral AFOs/shoes as only walking to/from bathroom due to frequency (bowel prep). Completed without physical assistance from therapist. Extra time spent on toilet due to frequency of bowels.  Activity Limited By: Patient Choice    Education  Persons Educated: Patient  Patient Barriers To Learning: None Noted  Teaching Methods: Verbal Instruction  Patient Response: Verbalized Understanding  Topics: Plan/Goals of PT Interventions;Use of Assistive Device/Orthosis;Mobility Progression;Safety Awareness;Up with Assist Only;Importance of Increasing Activity;Therapy Schedule    Assessment/Progress  Impaired Mobility Due To: Safety Concerns;Decreased Activity Tolerance  Assessment/Progress: Should Improve w/ Continued PT;Expect Good Progress  Comments: Patient is likely near his functional baseline. Currently limited by medical status and completion of bowel prep for colonoscopy this afternoon. Will benefit from ongoing mobilization during inpatient stay to prepare for home discharge.    AM-PAC 6 Clicks Basic Mobility Inpatient  Turning from your back to your side while in a flat bed without using bed rails: None  Moving from lying on your back to sitting on the side of a flat bed without using bedrails : None  Moving to and from a bed to a chair (including a wheelchair): None  Standing up from a chair using your arms (e.g. wheelchair, or bedside chair): None  To walk in hospital room: None  Climbing 3-5 steps with a railing: A Little  Basic Mobility Inpatient Raw Score: 23  Standardized (T-scale) Score: 50.88  Mobility Goal [facility]: JH-HLM 7 Walk >= 25 ft    Goals  Goal Formulation: With Patient  Time For Goal Achievement: 3 days, To, 5 days  Patient Will Ambulate: Greater than 200 Feet, w/ Cane, w/ Stand By Assist    Plan  Treatment Interventions: Mobility training;Balance activities;Endurance training  Plan Frequency: 3-5 Days per Week  PT Plan for Next Visit: Don bilateral AFOs and shoes to progress gait into hallway with quad cane, higher level balance test/activities. Sign off if demonstrates back to baseline.    PT Discharge Recommendations  Recommendation: Home/prior living situation  Patient Currently Requires Equipment: Owns what is needed    Therapist  Royer Cristobal, PT, DPT [phone]   Date  01/02/2024

## 2024-01-02 NOTE — Procedure - Immed Post [600086]
 Colon/Lower EUS/Retrograde Enteroscopy     Post Lower Endoscopy Instructions    -If you feel feverish, have a temperature of 101 degrees or higher, persistent nausea and vomiting, abdominal pain or dark stools; please notify your nurse or GI physician.    -You may have abdominal cramping following the procedure this can be relieved by belching or passing air.    -If you have redness or swelling at the IV site, place a warm, wet washcloth over the affected areas for 15 minutes, 3-4 times a day until the redness subsides.  If symptoms continue for 2-3 days, contact your regular physician.    - If you have bleeding from your bowels over 2 tablespoons and increasing, please notify your physician.  A small amount of bleeding is normal if a biopsy or polyps were taken.    - If biopsies were obtained your provider will call you with the results, this can take up to 7 days after the procedure. Please note: As of April 5th, 2021, the Cures Act requires that all healthcare providers give patients immediate access to their health information. This information includes EHR (electronic health records), consultation notes, discharge summaries, images, lab reports, pathology reports, procedure notes, progress notes, and paper charts. This means you may receive test results in your MyChart before your provider has reviewed them with you. Your care team will follow up with you via MyChart or telephone after reviewing the tests to interpret your results for you.    - You may resume all your routine medications, if medications need to be held your physician and/or nurse will notify you post procedure.    SPECIFIC INSTRUCTIONS  INPATIENTS:  Ask for help when you get up in your room, as you may still be drowsy from your sedation.    Should you have any questions or concerns after your procedure please call [phone] M-F 8am-5:00 pm. After 5:00 pm, holidays or weekends call [phone] and ask for the GI Doctor on call.

## 2024-01-02 NOTE — Care Plan [600008]
 CA8 END OF SHIFT/ JHFRAT NOTE    Admission Date: 12/31/2023    Acute events, interventions, provider communication: Patient had Colonoscopy today    Patient Interventions and Education  Fall Risk/JHFRAT Interventions and Education: (Charting when applicable)  Total Fall Risk Score: Total Fall Risk Score: 6.  Elimination Interventions : Commode at bedside, Bed pan available in room, and Communicate timing of laxatives/diuretics with assistive personnel to support proactive elimination needs.   Medications : Bedside commode (i.e., urgency, frequency, dizziness) , Use of gait belt , Stay within arm's reach during toileting/showering (i.e., dizziness, orthostasis) , Educate patient on medication side effects, Consult the provider to decrease polypharmacy and unused medications , and Consult pharmacy for medication changes to decrease polypharmacy or high-risk medication use   Patient Care Equipment: Needs assistance with patient care equipment when ambulating, Ensure environment is free of clutter and walkways are clear from tripping hazards, and Assess need for patient equipment and remove if not in use  Mobility: Assist x1, Gait belt in use when ambulating, Utilize walker, cane, or additional walking aid for ambulation, and Elimination equipment at bedside (urinal or commode)   Cognition: Stay within arm's reach while patient ambulating/toileting/showering and Increase frequency of purposeful rounding  Risk for Moderate/Major Injury: Risk for fracture      2.   Active CBHP: No    3.   Restraints:  No       Intake and Output:      Intake/Output Summary (Last 24 hours) at 01/02/2024 1844  Last data filed at 01/02/2024 1542  Gross per 24 hour   Intake 4800 ml   Output 1525 ml   Net 3275 ml            Last Bowel Movement Date: 01/01/24   Problem: Glucose Management  Goal: Absence of hyperglycemia  Outcome: Goal Ongoing  Goal: Absence of Hypoglycemia  Outcome: Goal Ongoing  Goal: Glucose level within specified parameters  Outcome: Goal Ongoing     Problem: Moderate Fall Risk  Goal: Moderate Fall Risk  Outcome: Goal Ongoing     Problem: Pain  Goal: Management of pain  Outcome: Goal Ongoing  Goal: Knowledge of pain management  Outcome: Goal Ongoing  Goal: Progress Toward Pain Management Goals  Outcome: Goal Ongoing

## 2024-01-02 NOTE — Progress Notes [1]
 Inpatient Medicine General Progress Note      Name: Allen Gill        MRN: [id number]          DOB: 04-20-1990            Age: 33 y.o.  Admission Date: 12/31/2023       LOS: 2 days    Date of Service: 01/02/2024    Assessment/Plan:    Principal Problem:    GIB (gastrointestinal bleeding)    Allen Gill is a 33 y.o. 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      # Melena with concern for GIB   - Patient presented with few days of abdominal pain and new onset Melena   - He reports fresh blood when wiping after a bowel movement around 3 PM, which was accompanied by small blood clots.  - Pt denies NSAID use, alcohol use, Tobacco use   - No previous EGD or colonoscopy   - stable vitals on arrival with labs notable for leucocytosis   - CT A/P with Changes of cystectomy and urinary diversion without significant hydronephrosis or urinary tract calculi. 2.  Changes of colostomy/appendicostomy and small bowel anastomoses. No bowel obstruction. 3.  No acute findings by CT.    Plan:   > Admit patient to the floor   > IV Protonix  80 mg then 40 BID   > Consult GI - colonoscopy today 12/22  > Transfuse if hgb < 7      # Leucocytosis   # UTI   # Neurogenic bladder s/p ileal conduit and bilateral ureteral reattachment   - Pt with reported flank pain but no fever  - Empiric Ceftriaxone  started on admit    - cultures negative so will discontinue this      # Hypertension  # Heart failure with recovered EF  -Asymptomatic.  -Echo 11/05/2021 showed normal EF 50% with no regional wall motion abnormalities.  No significant valvular heart disease.  -Home medications: Coreg  6.25 mg twice daily, losartan  100 mg daily, spironolactone  25 mg daily and ezetimibe .  -Continue PTA Coreg  with hold parameters  > Hold losartan  and spironolactone  in setting of soft BP on admission and concern for GIB  >Continue ezetimibe .     # Diabetes mellitus  -Home regimen Lantus  26 units, metformin  and Mounjaro .  > Decreased Lantus  given NPO status and decrease PO intake.   > LDCF while inpatient.     # Chiari malformation  -Continue PTA levetiracetam  500 mg twice daily.     FEN NPO     VTE PPX Only SCDs     Dispo  - Admit to the floor        Code Status: Full Code                         Marylynn Candela, MD   Available by [facility] medical decision making due to the following:  1 or more chronic illness with progression  drug therapy requiring intensive monitoring for toxicity    _______________________________________________________________________    Subjective  Allen Gill is a 33 y.o. male.  Patient is feeling OK this AM. Stool is yellow/red, hoping it is clear enough. He thinks the  red is blood. Otherwise we is doing ok, without concerns. Denies fever, chills, shortness of breath, pain.     Medications  Scheduled Meds:carvedilol  (COREG ) tablet 12.5 mg, 12.5 mg, Oral, BID  insulin  aspart (U-100) (NOVOLOG  FLEXPEN U-100 INSULIN ) injection PEN 0-6 Units, 0-6 Units, Subcutaneous, ACHS (22)  insulin  glargine (LANTUS  SOLOSTAR U-100 INSULIN ) injection PEN 15 Units, 15 Units, Subcutaneous, QDAY w/breakfast  levETIRAcetam  (KEPPRA ) tablet 500 mg, 500 mg, Oral, BID  pantoprazole  (PROTONIX ) injection 40 mg, 40 mg, Intravenous, API(88-78)  rosuvastatin  (CRESTOR ) tablet 40 mg, 40 mg, Oral, QHS  [Held by Provider] spironolactone  (ALDACTONE ) tablet 50 mg, 50 mg, Oral, QDAY    Continuous Infusions:  PRN and Respiratory Meds:acetaminophen  Q4H PRN, bisacodyL  PRN, dextrose  50% PRN, melatonin QHS PRN, ondansetron  Q6H PRN **OR** ondansetron  Q6H PRN, polyethylene glycol 3350  QDAY PRN, sennosides-docusate sodium  QDAY PRN          Objective:                          Vital Signs: Last Filed                 Vital Signs: 24 Hour Range   BP: 107/62 (12/22 0257)  Temp: 36.6 ?C (97.9 ?F) (12/22 0257)  Pulse: 84 (12/22 0257)  Respirations: 16 PER MINUTE (12/22 0257)  SpO2: 98 % (12/22 0257)  O2 Device: Nasal cannula (12/22 0257)  O2 Liter Flow: 1 Lpm (12/22 0257) BP: (107-117)/(59-78)   Temp:  [36.6 ?C (97.9 ?F)-36.9 ?C (98.5 ?F)]   Pulse:  [84-101]   Respirations:  [16 PER MINUTE-18 PER MINUTE]   SpO2:  [92 %-98 %]   O2 Device: Nasal cannula  O2 Liter Flow: 1 Lpm   Intensity Pain Scale (Self Report): 5 (01/01/24 2210) Vitals:    12/31/23 2130 12/31/23 2356   Weight: 116.2 kg (256 lb 2.8 oz) 116.2 kg (256 lb 2.8 oz)         Intake/Output Summary:  (Last 24 hours)    Intake/Output Summary (Last 24 hours) at 01/02/2024 0846  Last data filed at 01/02/2024 0500  Gross per 24 hour   Intake 4000 ml   Output 2175 ml   Net 1825 ml           Physical Exam  Physical Exam  HENT:      Head: Normocephalic and atraumatic.   Cardiovascular:      Rate and Rhythm: Normal rate.   Pulmonary:      Effort: No respiratory distress.   Abdominal:      General: There is no distension.   Skin:     General: Skin is warm and dry.   Neurological:      Mental Status: He is alert.          Lab Review  24-hour labs:    Results for orders placed or performed during the hospital encounter of 12/31/23 (from the past 24 hours)   POC GLUCOSE    Collection Time: 01/01/24 11:07 AM   Result Value Ref Range    Glucose, POC 93 70 - 100 mg/dL   POC GLUCOSE    Collection Time: 01/01/24  4:35 PM   Result Value Ref Range    Glucose, POC 239 (H) 70 - 100 mg/dL   POC GLUCOSE    Collection Time: 01/01/24 10:25 PM   Result Value Ref Range    Glucose, POC 119 (H) 70 - 100 mg/dL   CBC AND DIFF  Collection Time: 01/02/24  5:14 AM   Result Value Ref Range    White Blood Cells 9.60 4.50 - 11.00 10*3/uL    Red Blood Cells 4.90 4.40 - 5.50 10*6/uL    Hemoglobin 13.9 13.5 - 16.5 g/dL    Hematocrit 59.5 59.9 - 50.0 %    MCV 82.3 80.0 - 100.0 fL    MCH 28.4 26.0 - 34.0 pg    MCHC 34.5 32.0 - 36.0 g/dL RDW 86.5 88.9 - 84.9 %    Platelet Count 324 150 - 400 10*3/uL    MPV 8.4 7.0 - 11.0 fL    Neutrophils 72.1 41.0 - 77.0 %    Lymphocytes 19.6 (L) 24.0 - 44.0 %    Monocytes 6.1 4.0 - 12.0 %    Eosinophils 2.0 0.0 - 5.0 %    Basophils 0.2 0.0 - 2.0 %    Absolute Neutrophil Count 6.90 1.80 - 7.00 10*3/uL    Absolute Lymph Count 1.90 1.00 - 4.80 10*3/uL    Absolute Monocyte Count 0.60 0.00 - 0.80 10*3/uL    Absolute Eosinophil Count 0.20 0.00 - 0.45 10*3/uL    Absolute Basophil Count 0.00 0.00 - 0.20 10*3/uL   COMPREHENSIVE METABOLIC PANEL    Collection Time: 01/02/24  5:14 AM   Result Value Ref Range    Sodium 139 137 - 147 mmol/L    Potassium 4.2 3.5 - 5.1 mmol/L    Chloride 101 98 - 110 mmol/L    Glucose 126 (H) 70 - 100 mg/dL    Blood Urea Nitrogen 12 7 - 25 mg/dL    Creatinine 9.23 9.59 - 1.24 mg/dL    Calcium  9.1 8.5 - 10.6 mg/dL    Total Protein 7.2 6.0 - 8.0 g/dL    Total Bilirubin 0.5 0.2 - 1.3 mg/dL    Albumin  4.0 3.5 - 5.0 g/dL    Alk Phosphatase 62 25 - 110 U/L    AST 32 7 - 40 U/L    ALT 72 (H) 7 - 56 U/L    CO2 32 (H) 21 - 30 mmol/L    Anion Gap 6 3 - 12    Glomerular Filtration Rate (GFR) >60 >60 mL/min   PROTIME INR (PT)    Collection Time: 01/02/24  5:14 AM   Result Value Ref Range    Protime 12.4 9.9 - 14.2 Seconds    INR 1.1 0.9 - 1.2   POC GLUCOSE    Collection Time: 01/02/24  7:39 AM   Result Value Ref Range    Glucose, POC 137 (H) 70 - 100 mg/dL       Point of Care Testing  (Last 24 hours)  Glucose: (!) 126 (01/02/24 0514)  POC Glucose (Download): (!) 137 (01/02/24 0739)    Radiology and other Diagnostics Review:    Pertinent radiology reviewed.    Marylynn CHRISTELLA Candela, MD

## 2024-01-02 NOTE — Progress Notes [1]
 OCCUPATIONAL THERAPY  NOTE   Name: Allen Gill   MRN: [id number]     DOB: 10-27-1990      Age: 33 y.o.  Admission Date: 12/31/2023     LOS: 2 days     Date of Service: 01/02/2024      Based on discussion with PT, patient's current level of function suggests that patient will progress with one discipline. OT will sign off at this time. Please re-consult if a change in functional status occurs.      Therapist: Vernell Mari, OT  Date: 01/02/2024

## 2024-01-02 NOTE — Anesthesia Postprocedure Evaluation [25]
 Post-Anesthesia Evaluation    Name: Allen Gill      MRN: [id number]     DOB: 10-06-1990     Age: 33 y.o.     Sex: male   __________________________________________________________________________     Procedure Information       Anesthesia Start Date/Time: 01/02/24 1453    Procedure: COLONOSCOPY, WITH BRUSH BIOPSY IF INDICATED    Location: ENDO 4 / ENDO/GI    Surgeons: Bonnetta Camellia BROCKS, MD            Post-Anesthesia Vitals  BP: 115/77 (12/22 1621)  Temp: 36.7 ?C (98.1 ?F) (12/22 1548)  Pulse: 91 (12/22 1621)  Respirations: 15 PER MINUTE (12/22 1621)  SpO2: 95 % (12/22 1621)  O2 Device: None (Room air) (12/22 1621)   Vitals Value Taken Time   BP 115/77 01/02/24 16:21   Temp 36.7 ?C (98.1 ?F) 01/02/24 15:48   Pulse 91 01/02/24 16:21   Respirations 15 PER MINUTE 01/02/24 16:21   SpO2 95 % 01/02/24 16:21   O2 Device None (Room air) 01/02/24 16:21   ABP     ART BP           Post Anesthesia Evaluation Note    Evaluation location: Pre/Post  Patient participation: recovered; patient participated in evaluation  Level of consciousness: alert    Pain score: 4  Pain management: adequate    Hydration: normovolemia  Temperature: 36.0?C - 38.4?C  Airway patency: adequate    Perioperative Events       Post-op nausea and vomiting: no PONV    Postoperative Status  Cardiovascular status: hemodynamically stable  Respiratory status: spontaneous ventilation  Follow-up needed: none  Additional comments: Neck pain 2/2 neck extension. Appears musculoskeletal by exam. Responded to 1g tylenol , 1g robaxin . Ok for discharge at this time.        Perioperative Events  There were no known complications for this encounter.

## 2024-01-03 ENCOUNTER — Encounter: Admit: 2024-01-03 | Discharge: 2024-01-03 | Payer: MEDICARE

## 2024-01-03 DIAGNOSIS — Z87440 Personal history of urinary (tract) infections: Secondary | ICD-10-CM

## 2024-01-03 DIAGNOSIS — Z880 Allergy status to penicillin: Secondary | ICD-10-CM

## 2024-01-03 DIAGNOSIS — Z881 Allergy status to other antibiotic agents status: Secondary | ICD-10-CM

## 2024-01-03 DIAGNOSIS — Z833 Family history of diabetes mellitus: Secondary | ICD-10-CM

## 2024-01-03 DIAGNOSIS — Z83438 Family history of other disorder of lipoprotein metabolism and other lipidemia: Secondary | ICD-10-CM

## 2024-01-03 DIAGNOSIS — Z7984 Long term (current) use of oral hypoglycemic drugs: Secondary | ICD-10-CM

## 2024-01-03 DIAGNOSIS — Z982 Presence of cerebrospinal fluid drainage device: Secondary | ICD-10-CM

## 2024-01-03 DIAGNOSIS — I428 Other cardiomyopathies: Secondary | ICD-10-CM

## 2024-01-03 DIAGNOSIS — K5909 Other constipation: Secondary | ICD-10-CM

## 2024-01-03 DIAGNOSIS — Z87442 Personal history of urinary calculi: Secondary | ICD-10-CM

## 2024-01-03 DIAGNOSIS — Q0703 Arnold-Chiari syndrome with spina bifida and hydrocephalus: Secondary | ICD-10-CM

## 2024-01-03 DIAGNOSIS — E1165 Type 2 diabetes mellitus with hyperglycemia: Secondary | ICD-10-CM

## 2024-01-03 DIAGNOSIS — Z8249 Family history of ischemic heart disease and other diseases of the circulatory system: Secondary | ICD-10-CM

## 2024-01-03 DIAGNOSIS — Z933 Colostomy status: Secondary | ICD-10-CM

## 2024-01-03 DIAGNOSIS — Z79899 Other long term (current) drug therapy: Secondary | ICD-10-CM

## 2024-01-03 DIAGNOSIS — E785 Hyperlipidemia, unspecified: Secondary | ICD-10-CM

## 2024-01-03 DIAGNOSIS — I110 Hypertensive heart disease with heart failure: Secondary | ICD-10-CM

## 2024-01-03 DIAGNOSIS — Z7985 Long-term (current) use of injectable non-insulin antidiabetic drugs: Secondary | ICD-10-CM

## 2024-01-03 DIAGNOSIS — Z91040 Latex allergy status: Secondary | ICD-10-CM

## 2024-01-03 DIAGNOSIS — Z794 Long term (current) use of insulin: Secondary | ICD-10-CM

## 2024-01-03 LAB — POC GLUCOSE
~~LOC~~ BKR POC GLUCOSE: 134 mg/dL — ABNORMAL HIGH (ref 70–100)
~~LOC~~ BKR POC GLUCOSE: 115 mg/dL — ABNORMAL HIGH (ref 70–100)

## 2024-01-03 MED ORDER — SIMETHICONE 80 MG PO CHEW
80 mg | ORAL | 0 refills | Status: DC | PRN
Start: 2024-01-03 — End: 2024-01-03

## 2024-01-03 NOTE — Discharge Summary [5]
 Discharge Summary      Name: Allen Gill  Medical Record Number: [id number]        Account Number:  [id number]  Date Of Birth:  [birth year]                         Age:  33 y.o.  Admit date:  12/31/2023                     Discharge date: 01/03/2024      Discharge Attending:  Marylynn Candela, MD  Discharge Summary Completed By: Marylynn CHRISTELLA Candela, MD    Service: Med Private [phone]    Reason for hospitalization:  GIB (gastrointestinal bleeding) [K92.2]    Primary Discharge Diagnosis:   Same as Above    Hospital Diagnoses:  Hospital Problems        Active Problems    * (Principal) GIB (gastrointestinal bleeding)     Present on Admission:   GIB (gastrointestinal bleeding)        Significant Past Medical History        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  Allergy  Arnold-Chiari malformation (CMS-HCC)  Cardiomyopathy (CMS-HCC)  Diabetes mellitus (CMS-HCC)  Headache(784.0)  Hydrocephalus (CMS-HCC)  Hypertension  Infection of VP shunt  Kidney stones      Comment:  frequent  Lung disease  Myelomeningocele (CMS-HCC)  Neurogenic bladder  OSA (obstructive sleep apnea)  Preop cardiovascular exam  Seizures (CMS-HCC)      Comment:  blank staring spells in childhood - last absentee                seizure 2012  Shunt malfunction  Spina bifida (CMS-HCC)  Tachycardia  Urinary retention      Comment:  Added automatically from request for surgery [phone]  Urinary tract infection      Comment:  frequent    Allergies   Latex, Amoxicillin , Ceclor [cefaclor], Clindamycin, and Zosyn  [piperacillin -tazobactam]    Brief Hospital Course     Allen Gill is a 33 year old male with a history of spina bifida with neurogenic bladder status post ileal conduit and bilateral ureteral reattachment, heart failure with recovered ejection fraction, hypertension, dyslipidemia, type 2 diabetes mellitus, and 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    He presented with several days of left-sided abdominal pain and new-onset melena, followed by episodes of hematochezia with small blood clots and fresh blood on wiping. He denied NSAID, tobacco, or alcohol use, and had no prior history of endoscopy or colonoscopy. On admission, he was hemodynamically stable with a hemoglobin of 14.5 g/dL. CT abdomen/pelvis showed post-surgical changes without acute findings or bowel obstruction. Gastroenterology was consulted and recommended colonoscopy, which was performed on 01/02/2024; post-procedure instructions were provided, and he tolerated the bowel prep and procedure without reported complications. No transfusions were required during the hospitalization. He was managed with IV pantoprazole  and kept NPO prior to the procedure. Hemoglobin was monitored every 8 hours as recommended by GI. The bleeding did not recur or worsen during the admission, and he remained hemodynamically stable throughout.    He has a history of neurogenic bladder status post ileal conduit and bilateral ureteral reattachment. He reported flank pain on admission but was afebrile. Urinalysis was notable for proteinuria, hematuria, and pyuria, with negative urine cultures. Empiric ceftriaxone  was  started on admission and discontinued after cultures returned negative. He continued to use bladder management devices during the hospitalization.    He has a history of heart failure with recovered ejection fraction (EF 50% on 11/05/2021) and hypertension. He remained asymptomatic from a cardiac standpoint during the admission. Losartan  and spironolactone  were held due to soft blood pressures. Carvedilol  and ezetimibe  were continued.    He was discharged to home in stable condition on 12/23.       Day of discharge exam notable for:   Physical Exam  Vitals reviewed.   Constitutional:       General: He is not in acute distress.  HENT:      Head: Normocephalic and atraumatic.   Cardiovascular:      Rate and Rhythm: Normal rate.   Pulmonary:      Effort: No respiratory distress.   Abdominal:      General: There is no distension.   Skin:     General: Skin is warm and dry.        Items Needing Follow Up   Pending items or areas that need to be addressed at follow up:     Pending Labs and Follow Up Radiology    Pending labs and/or radiology review at this time of discharge are listed below: Please note- any labs with collected status will not have a result; if this area is blank, there are no items for review.   Pending Labs       Order Current Status    EXTRA CLEAR URINE TUBE Collected (12/31/23 1638)    URINALYSIS, COMPLETE W/REFLEX TO CULTURE In process              Medications        Medication List        PAUSE taking these medications      losartan  50 mg tablet  Wait to take this until your doctor or other care provider tells you to start again.  Commonly known as: COZAAR   Dose: 50 mg  Take 1 tablet by mouth daily  Quantity: 90 tablet  Refills: 3            CONTINUE taking these medications      acetaminophen  500 mg tablet  Commonly known as: TYLENOL  EXTRA STRENGTH  Dose: 1,000 mg  Take two tablets by mouth every 6 hours as needed for Pain. Max of 4,000 mg of acetaminophen  in 24 hours.  Indications: pain  For: pain  Refills: 0     carvedilol  6.25 mg tablet  Commonly known as: COREG   Dose: 12.5 mg  take 2 tablets BY MOUTH TWICE DAILY WITH MEALS  Quantity: 180 tablet  Refills: 3     CHOLEcalciferoL  (vitamin D3) 50 mcg (2,000 unit) tablet  Dose: 2,000 Units  Take one tablet by mouth daily.  Quantity: 90 tablet  Refills: 0     cyanocobalamin  (vitamin B-12) 3,000 mcg Cap  Dose: 1 capsule  Take one capsule by mouth daily.  Refills: 0     ezetimibe  10 mg tablet  Commonly known as: ZETIA   Dose: 10 mg  TAKE 1 TABLET BY MOUTH once daily  Quantity: 90 tablet  Refills: 3     FIBER PO  Dose: 1 tablet  Take 1 tablet by mouth twice daily.  Refills: 0     FREESTYLE LIBRE 3 PLUS SENSOR sensor device  Generic drug: blood-glucose sensor  Use to check blood sugars continuously. Replace every 15 days.  For  diagnoses: Type 2 diabetes mellitus with hyperglycemia, without long-term current use of insulin  (CMS-HCC)  Quantity: 2 each  Refills: 11     insulin  glargine-yfgn 100 unit/mL (3 mL) injectable PEN  Commonly known as: SEMGLEE  U-100  Dose: 26 Units  Inject twenty six Units under the skin every morning. Indications: type 2 diabetes mellitus  For: type 2 diabetes mellitus  For diagnoses: Type 2 diabetes mellitus with hyperglycemia, without long-term current use of insulin  (CMS-HCC)  Quantity: 15 mL  Refills: 3     insulin  pen needles (disposable) 31 gauge x 5/16 pen needle  Commonly known as: COMFORT EZ PEN NEEDLES  Use to inject insulin  1x/day  For diagnoses: Type 2 diabetes mellitus with hyperglycemia, without long-term current use of insulin  (CMS-HCC)  Quantity: 100 each  Refills: 3     Leg Brace Misc  Bilateral Fitted leg brace and right shoe for foot drop.  Dx: spina bifida.  Quantity: 1 each  Refills: 0     levETIRAcetam  500 mg tablet  Commonly known as: KEPPRA   Dose: 500 mg  Take one tablet by mouth twice daily.  For diagnoses: Focal Epilepsy  Quantity: 180 tablet  Refills: 3     loperamide  2 mg capsule  Commonly known as: IMODIUM  A-D  Dose: 2 mg  Take one capsule by mouth as Needed for Diarrhea.  Refills: 0     metFORMIN -XR 500 mg extended release tablet  Commonly known as: GLUCOPHAGE  XR  TAKE 1 TABLET BY MOUTH IN THE MORNING AND 2 tablets IN THE EVENING with meals  For diagnoses: Type 2 diabetes mellitus without complication, without long-term current use of insulin  (CMS-HCC)  Quantity: 270 tablet  Refills: 1     MOUNJARO  12.5 mg/0.5 mL injector PEN  Generic drug: tirzepatide   Dose: 12.5 mg  Inject 0.5 mL under the skin every 7 days.  For diagnoses: Type 2 diabetes mellitus with hyperglycemia, without long-term current use of insulin  (CMS-HCC)  Quantity: 2 mL  Refills: 5 ONE-A-DAY MEN'S MULTIVITAMIN PO  Dose: 1 tablet  Take 1 tablet by mouth daily.  Refills: 0     rosuvastatin  40 mg tablet  Commonly known as: CRESTOR   Dose: 40 mg  TAKE 1 TABLET BY MOUTH at bedtime  Quantity: 90 tablet  Refills: 3     spironolactone  25 mg tablet  Commonly known as: ALDACTONE   Dose: 50 mg  take 2 tablets BY MOUTH ONCE daily WITH FOOD  For diagnoses: Essential (primary) hypertension, Other cardiomyopathies (CMS-HCC)  Quantity: 180 tablet  Refills: 1     VASCEPA  1 gram capsule  Generic drug: icosapent  ethyL  Dose: 2 g  TAKE TWO CAPSULES BY MOUTH TWICE DAILY WITH MEALS  Quantity: 360 capsule  Refills: 1              Return Appointments and Scheduled Appointments     Scheduled appointments:      Jan 23, 2024 2:00 PM  Telephone visit with Nathanel Miyamoto, [facility]  Family Medicine: [facility], [facility]) [address]  [address], [place] [address]  [place] [address]  [phone]     Feb 08, 2024 11:40 AM  Office visit with Norman CHRISTELLA Coder, DO  Cardiovascular Medicine: [facility] ([facility] Exam) [address]  [address], [place]  [place] [address]  [phone]     Mar 27, 2024 1:00 PM  Lab Visit with [facility]  Lab, Draw Station: [place], [facility] [place] [facility] (--)  [address]  [address]  [place] [address]  [phone]     Mar 27, 2024 1:30 PM  (Arrive by 1:15 PM)  Office visit with Ruther LELON Flies, MD  Oncology: [place], [facility] Exam) [address]  [address]  [place] [address]  [phone]     Jun 21, 2024 1:30 PM  Office visit with Harlene DELENA Dustman, MD  Internal Medicine: [place], Medical [facility]) [address]  [address], [place] [address]  [place] [address]  [phone]            Consults, Procedures, Diagnostics, Micro, Pathology   Consults: GI  Surgical Procedures & Dates: None  Significant Diagnostic Studies, Micro and Procedures: noted in brief hospital course  Significant Pathology: none                     Discharge Disposition, Condition   Patient Disposition: Home or Self Care [01]  Condition at Discharge: Stable    Code Status   Full Code    Patient Instructions     Activity       Activity as Tolerated   As directed      It is important to keep increasing your activity level after you leave the hospital.  Moving around can help prevent blood clots, lung infection (pneumonia) and other problems.  Gradually increasing the number of times you are up moving around will help you return to your normal activity level more quickly.  Continue to increase the number of times you are up to the chair and walking daily to return to your normal activity level. Begin to work toward your normal activity level at discharge          Diet       Diabetic Diet   As directed      You should eat between 1600 and 2000 calories per day.  This is equal to 60g (grams) of carbohydrates per meal, and 30g of carbohydrates for a bedtime snack.    If you have questions about your diet after you go home, you can call a dietitian at [phone].             Discharge education provided to patient.    Additional Orders: Case Management, Supplies, Home Health     Home Health/DME       None            Discharge Attending Time: I, the attending, spent greater than 30 minutes in counseling pt, coordinating discharge care, placing discharge orders and complete discharge summary.    Signed:  Marylynn CHRISTELLA Candela, MD  01/03/2024      cc:  Primary Care Physician:  Dustman Harlene DELENA   Verified    Referring physicians:  No ref. provider found   Additional provider(s):        Did we miss something? If additional records are needed, please fax a request on office letterhead to [phone]. Please include the patient's name, date of birth, fax number and type of information needed. Additional request can be made by email at [email or web address]. For general questions of information about electronic records sharing, call [phone].

## 2024-01-03 NOTE — Care Plan [600008]
 Problem: Glucose Management  Goal: Absence of hyperglycemia  Outcome: Goal Achieved  Goal: Absence of Hypoglycemia  Outcome: Goal Achieved  Goal: Glucose level within specified parameters  Outcome: Goal Achieved     Problem: Moderate Fall Risk  Goal: Moderate Fall Risk  Outcome: Goal Achieved     Problem: Pain  Goal: Management of pain  Outcome: Goal Achieved  Goal: Knowledge of pain management  Outcome: Goal Achieved  Goal: Progress Toward Pain Management Goals  Outcome: Goal Achieved

## 2024-01-03 NOTE — Progress Notes [1]
 Patient verbalized understanding of discharge. AVS reviewed with patient, all questions answered. New medications reviewed with patient. All belongings sent with patient. Patient transported to lobby.    Allen Gill discharged on 01/03/2024.  Discharge instructions reviewed with patient.  Functional assessment at discharge complete: Yes .

## 2024-01-03 NOTE — Progress Notes [1]
 CA8 END OF SHIFT/ JHFRAT NOTE    Admission Date: 12/31/2023    Acute events, interventions, provider communication: na    Patient Interventions and Education  Fall Risk/JHFRAT Interventions and Education: (Charting when applicable)  Total Fall Risk Score: Total Fall Risk Score: 8.  Elimination Interventions : Use of bladder management device (e.g., male/male external urinary containment device)   Medications : Educate patient on medication side effects  Patient Care Equipment: Ensure environment is free of clutter and walkways are clear from tripping hazards  Mobility: SBA  Cognition: N/A  Risk for Moderate/Major Injury: Risk for fracture      2.   Active CBHP: No    3.   Restraints:  No       Intake and Output:      Intake/Output Summary (Last 24 hours) at 01/03/2024 0338  Last data filed at 01/02/2024 2215  Gross per 24 hour   Intake 2920 ml   Output 1000 ml   Net 1920 ml            Last Bowel Movement Date: 01/02/24

## 2024-01-03 NOTE — Progress Notes [1]
 Gastroenterology Consultation - Follow Up      HISTORY OF PRESENT ILLNESS  Allen Gill is a pleasant 33 y.o. 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  GI consulted for the same he reports 1-2 episodes of small bowel hematochezia that started 1 day prior.  Denies any prior episodes of hematochezia or history of melena.  Also reports abdominal pain for the past 4 to 5 days.  Denies any NSAID use or blood thinners.  CTAP on 12/20 showed changes of colostomy/appendicostomy and small bowel anastomosis, no bowel obstruction noted, suture material noted at GE junction representing prior fundoplication.  Hemoglobin on admission around baseline at 13-14.  GI recommended colonoscopy for further evaluation.     24 Hour Events/Subjective:   Reviewed endoscopy report with patient.  Hemoglobin 13.1 this AM.  Denies any nausea.  Reports minimal abdominal pain that he attributes to gas.     REVIEW OF MEDICAL RECORDS  Past Medical History:    ADD (attention deficit disorder)    Allergy    Arnold-Chiari malformation (CMS-HCC)    Cardiomyopathy (CMS-HCC)    Diabetes mellitus (CMS-HCC)    Headache(784.0)    Hydrocephalus (CMS-HCC)    Hypertension    Infection of VP shunt    Kidney stones    Lung disease    Myelomeningocele (CMS-HCC)    Neurogenic bladder    OSA (obstructive sleep apnea)    Preop cardiovascular exam    Seizures (CMS-HCC)    Shunt malfunction    Spina bifida (CMS-HCC)    Tachycardia    Urinary retention    Urinary tract infection     Surgical History:   Procedure Laterality Date    SHUNT CREATION VENTRICULAR PERITONEAL Right 06/14/2014    Performed by Idelia Lango, MD at [facility] OR    SHUNT REVISION VENTRICULAR PERITONEAL Right 04/23/2015    Performed by Idelia Lango, MD at [facility] OR    REMOVAL HARDWARE HEAD: removal of left ventriculopleural shunt Left 05/20/2015    Performed by Idelia Lango, MD at [facility] OR    SHUNT REMOVAL VENTRICULAR PERITONEAL Right 05/23/2015    Performed by Idelia Lango, MD at [facility] OR    SHUNT CREATION VENTRICULAR PERITONEAL Left 06/03/2015    Performed by Idelia Lango, MD at [facility] OR    CYSTOURETHROSCOPY, CYSTOLITHOLAPAXY N/A 08/05/2015    Performed by Ronold Rubin, MD at [facility] OR    HOLMIUM LASER ENUCLEATION OF PROSTATE (NO MORCELLATION) N/A 08/19/2015    Performed by Gwenn Kirke DASEN, MD at [facility] OR    CYSTOSCOPY N/A 08/19/2015    Performed by Gwenn Kirke DASEN, MD at [facility] OR    CYSTOSCOPY EVACUATION CLOTS N/A 08/29/2015    Performed by Gwenn Kirke DASEN, MD at [facility] OR    CYSTOSCOPY, URETHERAL DILATION N/A 02/20/2016    Performed by Gwenn Kirke DASEN, MD at [facility] OR    CYSTECTOMY, ILEAL CONDUIT N/A 10/13/2016    Performed by Carolynn Silversmith, MD at [facility] OR    REVISION SHUNT - VENTRICULO-PERITONEAL Left 12/16/2016    Performed by Vivica Domino, MD at [facility] OR    CREATION SHUNT - VENTRICULO-PERITONEAL: Left side. 2 hours, proximal catheter and valve in place. will need distal catheter placed, Dr. Shirlean to complete. Supine Left 12/22/2016  Performed by Idelia Lango, MD at [facility] OR    PR [facility] PROGRAMMABLE CEREBROSPINAL SHUNT  01/07/2017    PERCUTANEOUS NEPHROSTOLITHOTOMY/ PYELOSTOLITHOTOMY - GREATER THAN 2 CM Left 05/25/2017    Performed by Lenette Blush, MD, [facility] OR    CREATION SHUNT - VENTRICULO-PERITONEAL Right 08/04/2017    Performed by Idelia Lango, MD at [facility] OR    PERCUTANEOUS NEPHROSTOLITHOTOMY/ PYELOSTOLITHOTOMY - 2 CM OR LESS Right 06/07/2019    Performed by Lenette Blush SAUNDERS, MD at [facility] OR    CYSTOURETHROSCOPY WITH URETEROSCOPY AND/ OR PYELOSCOPY - WITH REMOVAL/ MANIPULATION CALCULUS Right 06/07/2019    Performed by Lenette Blush SAUNDERS, MD at [facility] OR    URETERONEOCYSTOSTOMY - VESICO-PSOAS HITCH/ BLADDER FLAP Right 04/28/2022 Performed by Carolynn Ruther ORN, MD at [facility] OR    CATHETER IMPLANT/REVISION  12/27/09    distal end of the catheter was revised    CATHETER IMPLANT/REVISION  01/31/10    replacement of ventricular catheter with [employer] framelessstereotaxis catheter    HX ABDOMEN SURGERY      fundoplication    HX ABDOMEN SURGERY  01/2001    Cecostomy    HX BACK SURGERY      repair of spina bifida    HX BRAIN SURGERY  5 months old    Chiari decompression    HX EAR TUBES      HX HERNIA REPAIR      inguinal hernia    HX SURGERY  at 2 weeks    VP Shunt    HX TONSILLECTOMY      HX TRACHEOSTOMY      removed at age 17    SHUNT REVISION  67 months old    SHUNT REVISION  33 years old    SHUNT REVISION  October 2010    [place]    SHUNT REVISION  12/11/09    replacment of valve to acodman hakem adjustablevalve    SHUNT REVISION  02/17/10    left frontal ventriculopleural shunt    VENTRICULOSTOMY  02/03/10    removal of all shunt components and placementofright frontal ventriculostomy     Social History[1]  Allergies[2]  Current Medications[3]  I personally reviewed past medical history, family history, and social history.     REVIEW OF SYSTEMS  HEENT: No loss of vision or bleeding gums   Cardiovascular: No chest pain    Respiratory: No wheezing   Gastrointestinal: See HPI above.     PHYSICAL EXAMINATION  Vital Signs: BP 112/53 (BP Source: Arm, Right Upper)  - Pulse 79  - Temp 36.8 ?C (98.2 ?F)  - Ht 157.5 cm (5' 2)  - Wt 116.2 kg (256 lb 2.8 oz)  - SpO2 94%  - BMI 46.85 kg/m?   Body mass index is 46.85 kg/m?SABRA  Physical Exam  Constitutional:       General: He is awake.   Cardiovascular:      Rate and Rhythm: Normal rate.   Pulmonary:      Effort: Pulmonary effort is normal. No respiratory distress.   Abdominal:      General: Bowel sounds are normal. There is no distension.      Palpations: Abdomen is soft.      Tenderness: There is no abdominal tenderness.   Neurological:      Mental Status: He is alert.   Psychiatric:         Mood and Affect: Mood normal.         Speech: Speech normal.  RECENT LABS - Pertinent labs reviewed by me personally and abbreviated in HPI above.     RADIOLOGY - I have personally reviewed the following pertinent radiology images and/or reports:     CTAP with contrast 12/31/2023  1.  Changes of cystectomy and urinary diversion without significant hydronephrosis or urinary tract calculi.   2.  Changes of colostomy/appendicostomy and small bowel anastomoses. No bowel obstruction.   3.  No acute findings by CT.     GI PROCEDURES - I have personally reviewed the following pertinent GI procedure images and/or reports:     Colonoscopy 01/02/2024  - The examined portion of the ileum was normal.   - Moderate amount of red/orange liquid throughout the colon. No blood.   - The examination was otherwise normal on direct and retroflexion views.   - No hemorrhoids.     ASSESSMENT AND PLAN  Hematochezia  Chronic constipation s/p cecostomy in 2003  History of Arnold-Chiari malformation s/p VP shunt  Heart failure with recovered ejection fraction  Neurogenic bladder with ileal conduit  History of prior fundoplication    Recommendations:  - Monitor for overt GI bleeding signs & symptoms  - No barriers to discharge from GI standpoint  - Notify us  immediately if patient experiences brisk GI bleeding and/or a sudden non-dilutional 2 gm drop in Hgb.   - Repeat colonoscopy in 10 years for screening purposes (please include on discharge summary for patient's PCP to refer her back to GI at that time)     Total Time Today was 35 minutes in the following activities: Preparing to see the patient, Obtaining and/or reviewing separately obtained history, Performing a medically appropriate examination and/or evaluation, Counseling and educating the patient/family/caregiver, Documenting clinical information in the electronic or other health record, Independently interpreting results (not separately reported) and communicating results to the patient/family/caregiver, and Care coordination (not separately reported)     Patient discussed and plan agreed upon with Dr. Swaminathan. Thank you for this consultation. It has been a pleasure to be a part of the care team. Please feel free to contact the GI consult team with any questions or concerns.    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 Amox/clav in 2014. He certainly took it.  Notes from that time indicate diarrhea. Discussed this with him on 03/13/18 and he stated that he'd taken amox 3 times in past and each time had HA, debilitating fatigue and diarrhea. And maybe rash. At this time he was tolerating IV ampicillin . Doria, MD 03/13/18  Update Luchi MD, 11/28/18: He got IV ampicillin  at [facility] 2/29 to 03/15/18 w/o rash or SE. He was started on Augmentin  05/08/19 without significant SE  Ceclor [Cefaclor] HIVES     Pt has tolerated ancef , keflex , cefpodoxime , ceftriaxone , cefoxitin , and cefepime  (many prescriptions for these in med review in [place]).    Clindamycin HIVES    Zosyn  [Piperacillin -Tazobactam] HIVES   [3]   Current Facility-Administered Medications   Medication Dose Route Frequency Provider Last Rate Last Admin    acetaminophen  (TYLENOL ) tablet 650 mg  650 mg Oral Q4H PRN Claudie Re A, MD   650 mg at 01/02/24 1150    carvedilol  (COREG ) tablet 12.5 mg  12.5 mg Oral BID Khader, Mohammad A, MD   12.5 mg at 01/02/24 2016    dextrose  50% (D50) syringe 25-50 mL  12.5-25 g Intravenous PRN Claudie Re LABOR, MD        insulin  aspart (U-100) (NOVOLOG  FLEXPEN U-100 INSULIN ) injection PEN 0-6 Units  0-6 Units Subcutaneous ACHS (22) Claudie Re LABOR, MD   2 Units at 01/01/24 1750    insulin  glargine (LANTUS  SOLOSTAR U-100 INSULIN ) injection PEN 15 Units  15 Units Subcutaneous QDAY w/breakfast Brett Marylynn HERO, MD   15 Units at 01/02/24 0850    levETIRAcetam  (KEPPRA ) tablet 500 mg  500 mg Oral BID Claudie Re A, MD   500 mg at 01/02/24 2013    melatonin tablet 5 mg  5 mg Oral QHS PRN Claudie Re LABOR, MD        ondansetron  (ZOFRAN  ODT) rapid dissolve tablet 4 mg  4 mg Oral Q6H PRN Claudie Re LABOR, MD        Or    ondansetron  HCL (PF) (ZOFRAN  (PF)) injection 4 mg  4 mg Intravenous Q6H PRN Claudie Re A, MD        pantoprazole  (PROTONIX ) injection 40 mg  40 mg Intravenous BID(11-21) Claudie Re LABOR, MD   40 mg at 01/02/24 2013    polyethylene glycol 3350  (MIRALAX ) packet 17 g  1 packet Oral QDAY PRN Claudie Re LABOR, MD        rosuvastatin  (CRESTOR ) tablet 40 mg  40 mg Oral QHS Claudie Re A, MD   40 mg at 01/02/24 2210    sennosides-docusate sodium  (SENOKOT-S) tablet 1 tablet  1 tablet Oral QDAY PRN Claudie Re LABOR, MD        [Held by Provider] spironolactone  (ALDACTONE ) tablet 50 mg  50 mg Oral QDAY Claudie Re LABOR, MD

## 2024-01-04 ENCOUNTER — Encounter: Admit: 2024-01-04 | Discharge: 2024-01-04 | Payer: MEDICARE

## 2024-01-06 ENCOUNTER — Encounter: Admit: 2024-01-06 | Discharge: 2024-01-06 | Payer: MEDICARE

## 2024-01-06 ENCOUNTER — Ambulatory Visit: Admit: 2024-01-06 | Discharge: 2024-01-07 | Payer: MEDICARE

## 2024-01-06 VITALS — BP 135/79

## 2024-01-06 DIAGNOSIS — K921 Melena: Principal | ICD-10-CM

## 2024-01-06 DIAGNOSIS — Z936 Other artificial openings of urinary tract status: Secondary | ICD-10-CM

## 2024-01-06 DIAGNOSIS — N390 Urinary tract infection, site not specified: Secondary | ICD-10-CM

## 2024-01-06 DIAGNOSIS — Z09 Encounter for follow-up examination after completed treatment for conditions other than malignant neoplasm: Secondary | ICD-10-CM

## 2024-01-06 NOTE — Progress Notes [1]
 Date of Service: 01/06/2024    Allen Gill is a 33 y.o. male.  DOB: 09-02-1990  MRN: [id number]          Assessment and Plan:    [name] was seen today for post-hospital follow up.    Diagnoses and all orders for this visit:    Gastrointestinal hemorrhage with melena    Hospital discharge follow-up    Urinary tract infection with hematuria, site unspecified    S/P ileal conduit (CMS-HCC)      No orders of the defined types were placed in this encounter.    There are no discontinued medications.    Patient Instructions   Advised to contact Urology office and ensure he has follow up appt in January to discuss the stricture issue.   Visit Disposition       Dispositions    Return if symptoms worsen or fail to improve.            Future Appointments   Date Time Provider Department Center   01/23/2024  2:00 PM Leontine Service, [id number]   02/08/2024 11:40 AM Dorise, Norman HERO, DO [id number] CVM Exam   03/27/2024  1:00 PM IC2 LAB RESOURCE [facility]2LAB None   03/27/2024  1:30 PM Wyre, Ruther ORN, MD [id number] [place] Exam   06/21/2024  1:30 PM Bethena Harlene LABOR, MD [facility] Community     Return if symptoms worsen or fail to improve.       Subjective:          Chief Complaint   Patient presents with    Post-hospital Follow Up     Hospital follow up   Stomach pain   GI bleeding  Colonoscopy   Feeling better   Bowels are regular   Had to take imodium  due   To loose stools   Very little abdominal pain   Blood pressure discuss  Losartan  if top number is above 120s  He has not seen a top number below 130  Just took b/p 111/68  Took losartan  at 10am       History of Present Illness    He presents today via telehealth for hospital follow up.     Patient was seen at [facility] and admitted from 12/20 - 01/03/24 for GIB.      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    He presented with several days of left-sided abdominal pain and new-onset melena, followed by episodes of hematochezia with small blood clots and fresh blood on wiping. He denied NSAID, tobacco, or alcohol use, and had no prior history of endoscopy or colonoscopy. On admission, he was hemodynamically stable with a hemoglobin of 14.5 g/dL. CT abdomen/pelvis showed post-surgical changes without acute findings or bowel obstruction. Gastroenterology was consulted and recommended colonoscopy, which was performed on 01/02/2024 (no source of bleeding identified); post-procedure instructions were provided, and he tolerated the bowel prep and procedure without reported complications. No transfusions were required during the hospitalization. He was managed with IV pantoprazole  and kept NPO prior to the procedure. Hemoglobin was monitored every 8 hours as recommended by GI. The bleeding did not  recur or worsen during the admission, and he remained hemodynamically stable throughout.    He has a history of neurogenic bladder status post ileal conduit and bilateral ureteral reattachment. He reported flank pain on admission but was afebrile. Urinalysis was notable for proteinuria, hematuria, and pyuria, with negative urine cultures. Empiric ceftriaxone  was started on admission and discontinued after cultures returned negative. He continued to use bladder management devices during the hospitalization.    He has a history of heart failure with recovered ejection fraction (EF 50% on 11/05/2021) and hypertension. He remained asymptomatic from a cardiac standpoint during the admission. Losartan  and spironolactone  were held due to soft blood pressures. Carvedilol  and ezetimibe  were continued. He was discharged to home in stable condition on 12/23.    ___________________________________________________    They were discharged needing follow up on blood pressures as his losartan  was held for soft BP, but carvedilol  and ezetimibe , spironolactone  were continued. He has resumed his losartan  after messaging PCP on MyChart. Every AM his BP has been consistently 130's/80s so he has taken it every AM since d/c.      Nursing TCM note was reviewed prior to appointment.     Since returning home they have been improving.  No further blood in stool noticed. Has started taking Imodium  for loose stools. Denies abd pain.     Of note, he was also admitted prior to the above from 12/8-12/10/25 at [facility] for UTI. According to notes, he noticed hematuria in his urostomy bag associated with back and kidney pain for which antibiotcs were started. Imaging noted prior strictures with a possible new stricture. He has urology follow-up arranged for further evaluation. He has complicated medical history of spina bifida with neurogenic bladder s/p ileal conduit and bilateral ureteral reimplantation.     Today he reports urostomy bag is having good output that is yellow. Stoma is healthy and denies skin breakdown. Still has stricture and plans to discuss with Urologist at January appt per patient.       Past Medical History:    ADD (attention deficit disorder)    Allergy    Arnold-Chiari malformation (CMS-HCC)    Cardiomyopathy (CMS-HCC)    Diabetes mellitus (CMS-HCC)    Headache(784.0)    Hydrocephalus (CMS-HCC)    Hypertension    Infection of VP shunt    Kidney stones    Lung disease    Myelomeningocele (CMS-HCC)    Neurogenic bladder    OSA (obstructive sleep apnea)    Preop cardiovascular exam    Seizures (CMS-HCC)    Shunt malfunction    Spina bifida (CMS-HCC)    Tachycardia    Urinary retention    Urinary tract infection     Surgical History:   Procedure Laterality Date    SHUNT CREATION VENTRICULAR PERITONEAL Right 06/14/2014 Performed by Idelia Lango, MD at [facility] OR    SHUNT REVISION VENTRICULAR PERITONEAL Right 04/23/2015    Performed by Idelia Lango, MD at [facility] OR    REMOVAL HARDWARE HEAD: removal of left ventriculopleural shunt Left 05/20/2015    Performed by Idelia Lango, MD at [facility] OR    SHUNT REMOVAL VENTRICULAR PERITONEAL Right 05/23/2015    Performed by Idelia Lango, MD at [facility] OR    SHUNT CREATION VENTRICULAR PERITONEAL Left 06/03/2015    Performed by Idelia Lango, MD at [facility] OR    CYSTOURETHROSCOPY, CYSTOLITHOLAPAXY N/A 08/05/2015    Performed by Ronold Rubin, MD at [facility] OR    HOLMIUM LASER ENUCLEATION OF PROSTATE (NO  MORCELLATION) N/A 08/19/2015    Performed by Gwenn Kirke DASEN, MD at [facility] OR    CYSTOSCOPY N/A 08/19/2015    Performed by Gwenn Kirke DASEN, MD at [facility] OR    CYSTOSCOPY EVACUATION CLOTS N/A 08/29/2015    Performed by Gwenn Kirke DASEN, MD at [facility] OR    CYSTOSCOPY, URETHERAL DILATION N/A 02/20/2016    Performed by Gwenn Kirke DASEN, MD at [facility] OR    CYSTECTOMY, ILEAL CONDUIT N/A 10/13/2016    Performed by Carolynn Silversmith, MD at [facility] OR    REVISION SHUNT - VENTRICULO-PERITONEAL Left 12/16/2016    Performed by Vivica Domino, MD at [facility] OR    CREATION SHUNT - VENTRICULO-PERITONEAL: Left side. 2 hours, proximal catheter and valve in place. will need distal catheter placed, Dr. Shirlean to complete. Supine Left 12/22/2016    Performed by Idelia Lango, MD at [facility] OR    PR [facility] PROGRAMMABLE CEREBROSPINAL SHUNT  01/07/2017    PERCUTANEOUS NEPHROSTOLITHOTOMY/ PYELOSTOLITHOTOMY - GREATER THAN 2 CM Left 05/25/2017    Performed by Lenette Blush, MD, [facility] OR    CREATION SHUNT - VENTRICULO-PERITONEAL Right 08/04/2017    Performed by Idelia Lango, MD at [facility] OR    PERCUTANEOUS NEPHROSTOLITHOTOMY/ PYELOSTOLITHOTOMY - 2 CM OR LESS Right 06/07/2019    Performed by Lenette Blush SAUNDERS, MD at [facility] OR    CYSTOURETHROSCOPY WITH URETEROSCOPY AND/ OR PYELOSCOPY - WITH REMOVAL/ MANIPULATION CALCULUS Right 06/07/2019    Performed by Lenette Blush SAUNDERS, MD at [facility] OR    URETERONEOCYSTOSTOMY - VESICO-PSOAS HITCH/ BLADDER FLAP Right 04/28/2022    Performed by Carolynn Silversmith ORN, MD at [facility] OR    COLONOSCOPY, WITH BRUSH BIOPSY IF INDICATED N/A 01/02/2024    Performed by Bonnetta Camellia BROCKS, MD at [facility] ENDO    CATHETER IMPLANT/REVISION  12/27/09    distal end of the catheter was revised    CATHETER IMPLANT/REVISION  01/31/10    replacement of ventricular catheter with [employer] framelessstereotaxis catheter    HX ABDOMEN SURGERY      fundoplication    HX ABDOMEN SURGERY  01/2001    Cecostomy    HX BACK SURGERY      repair of spina bifida    HX BRAIN SURGERY  5 months old    Chiari decompression    HX EAR TUBES      HX HERNIA REPAIR      inguinal hernia    HX SURGERY  at 2 weeks    VP Shunt    HX TONSILLECTOMY      HX TRACHEOSTOMY      removed at age 40    SHUNT REVISION  57 months old    SHUNT REVISION  33 years old    SHUNT REVISION  October 2010    [place]    SHUNT REVISION  12/11/09    replacment of valve to acodman hakem adjustablevalve    SHUNT REVISION  02/17/10    left frontal ventriculopleural shunt    VENTRICULOSTOMY  02/03/10    removal of all shunt components and placementofright frontal ventriculostomy     Family History   Problem Relation Name Age of Onset    Diabetes Father Jyron Turman     Hypertension Father Kunal Levario     Other Father Maclean Foister         glaucoma    High Cholesterol Maternal Grandfather Remmel Conyers      Social History[1]     I reviewed medications, allergies,  problem list and tobacco history at this visit.    I reviewed with the patient their current medications and specifically any new medications prescribed at the time of this visit and we reviewed the expected benefits and potential side effects. All questions are answered to the patient's satisfaction.  Prior consultations, labs, radiology reports reviewed at the time of this visit.    BMI:  There is no height or weight on file to calculate BMI.  No data recorded  Wt Readings from Last 10 Encounters: 12/31/23 116.2 kg (256 lb 2.8 oz)   12/21/23 117.5 kg (259 lb)   12/14/23 120.1 kg (264 lb 12.8 oz)   10/26/23 113.4 kg (250 lb)   10/25/23 114.3 kg (252 lb)   10/11/23 114.4 kg (252 lb 3.3 oz)   07/19/23 113.4 kg (250 lb)   06/02/23 113.9 kg (251 lb)   01/27/23 120.7 kg (266 lb)   01/04/23 122 kg (269 lb)          ROS  (See HPI)    Objective:          acetaminophen  (TYLENOL  EXTRA STRENGTH) 500 mg tablet Take two tablets by mouth every 6 hours as needed for Pain. Max of 4,000 mg of acetaminophen  in 24 hours.  Indications: pain    blood-glucose sensor (FREESTYLE LIBRE 3 PLUS SENSOR) sensor device Use to check blood sugars continuously. Replace every 15 days.    carvediloL  (COREG ) 6.25 mg tablet take 2 tablets BY MOUTH TWICE DAILY WITH MEALS    cholecalciferol (+) (VITAMIN D3) 2,000 unit tablet Take one tablet by mouth daily.    cyanocobalamin  (vitamin B-12) 3,000 mcg cap Take one capsule by mouth daily.    ezetimibe  (ZETIA ) 10 mg tablet TAKE 1 TABLET BY MOUTH once daily    insulin  glargine-yfgn (SEMGLEE  U-100) 100 unit/mL (3 mL) injectable PEN Inject twenty six Units under the skin every morning. Indications: type 2 diabetes mellitus    insulin  pen needles (disposable) (COMFORT EZ PEN NEEDLES) 31 gauge x 5/16 pen needle Use to inject insulin  1x/day    Leg Brace misc Bilateral Fitted leg brace and right shoe for foot drop.  Dx: spina bifida.    levETIRAcetam  (KEPPRA ) 500 mg tablet Take one tablet by mouth twice daily.    loperamide  (IMODIUM  A-D) 2 mg capsule Take one capsule by mouth as Needed for Diarrhea.    [Paused] losartan  (COZAAR ) 50 mg tablet Take 1 tablet by mouth daily    metFORMIN -XR (GLUCOPHAGE  XR) 500 mg extended release tablet TAKE 1 TABLET BY MOUTH IN THE MORNING AND 2 tablets IN THE EVENING with meals    multivit-min/folic/vit K/lycop (ONE-A-DAY MEN'S MULTIVITAMIN PO) Take 1 tablet by mouth daily.    psyllium seed (with sugar) (FIBER PO) Take 1 tablet by mouth twice daily.    rosuvastatin  (CRESTOR ) 40 mg tablet TAKE 1 TABLET BY MOUTH at bedtime    spironolactone  (ALDACTONE ) 25 mg tablet take 2 tablets BY MOUTH ONCE daily WITH FOOD    tirzepatide  (MOUNJARO ) 12.5 mg/0.5 mL injector PEN Inject 0.5 mL under the skin every 7 days.    VASCEPA  1 gram capsule TAKE TWO CAPSULES BY MOUTH TWICE DAILY WITH MEALS     There were no vitals filed for this visit.    There is no height or weight on file to calculate BMI.     Physical Exam  Constitutional:       General: He is not in acute distress.     Appearance: Normal appearance.  HENT:      Head: Normocephalic.   Neck:      Thyroid: No thyromegaly.      Trachea: Trachea normal.   Pulmonary:      Effort: Pulmonary effort is normal.   Genitourinary:     Comments: Urostomy bag present (not examined via Thursday)  Neurological:      General: No focal deficit present.      Mental Status: He is alert and oriented to person, place, and time.   Psychiatric:         Mood and Affect: Mood normal.         Total Time Today was 14  minutes in the following activities: Preparing to see the patient, obtaining and/or reviewing separately obtained history, performing a medically appropriate examination and/or evaluation, counseling and educating the patient/family/caregiver, ordering medications, tests, or procedures, referring and communication with other health care professionals (when not separately reported) and documenting clinical information in the electronic or other health record.                  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

## 2024-01-06 NOTE — Patient Instructions [37]
 Advised to contact Urology office and ensure he has follow up appt in January to discuss the stricture issue.

## 2024-01-09 ENCOUNTER — Encounter: Admit: 2024-01-09 | Discharge: 2024-01-09 | Payer: MEDICARE

## 2024-01-09 MED ORDER — ONETOUCH ULTRA TEST MISC STRP
1 | ORAL_STRIP | Freq: Every day | 1 refills | 90.00000 days | Status: AC
Start: 2024-01-09 — End: ?
  Filled 2024-01-10: qty 50, 50d supply, fill #0

## 2024-01-09 MED ORDER — LANCETS 33 GAUGE MISC MISC
1 | Freq: Every day | 1 refills | Status: AC
Start: 2024-01-09 — End: ?

## 2024-01-10 ENCOUNTER — Encounter: Admit: 2024-01-10 | Discharge: 2024-01-10 | Payer: MEDICARE

## 2024-01-10 MED FILL — LANCETS 33 GAUGE MISC MISC: 33 gauge | 90 days supply | Qty: 100 | Fill #0 | Status: CP

## 2024-01-16 ENCOUNTER — Encounter: Admit: 2024-01-16 | Discharge: 2024-01-16 | Payer: MEDICARE

## 2024-01-17 MED FILL — MOUNJARO 12.5 MG/0.5 ML SC PNIJ: 12.5 | SUBCUTANEOUS | 28 days supply | Qty: 2 | Fill #3 | Status: AC

## 2024-01-20 ENCOUNTER — Encounter: Admit: 2024-01-20 | Discharge: 2024-01-20 | Payer: MEDICARE

## 2024-01-20 NOTE — Progress Notes [1]
 PATIENT DROPPED OFF FORMS, COMPLETED AND PT NOTIFIED READY FOR PICK UP.

## 2024-01-23 ENCOUNTER — Ambulatory Visit: Admit: 2024-01-23 | Discharge: 2024-01-24 | Payer: MEDICARE

## 2024-01-23 ENCOUNTER — Encounter: Admit: 2024-01-23 | Discharge: 2024-01-23 | Payer: MEDICARE

## 2024-01-23 MED ORDER — ROSUVASTATIN 40 MG PO TAB
40 mg | ORAL_TABLET | Freq: Every evening | ORAL | 0 refills | 90.00000 days | Status: AC
Start: 2024-01-23 — End: ?

## 2024-01-23 NOTE — Progress Notes [1]
 A follow-up comprehensive medication management visit was completed today via telephone.    Referral reason: Diabetes Management  Referring provider: Harlene Dustman, MD    Assessment & Plan     Diabetes  Patient's diabetes is controlled as reflected by home blood sugars. Their goal glucose is pre-prandial 80-130 and post-prandial <180. Their most recent A1C was 7.6% on 12/14/2023 with a goal of < 7%    Patient reported home fasting blood sugars are at goal but possibly experiencing elevated post-prandial glucose as his last A1C had been elevated. Patient plans on resuming CGM use. Will follow up in a few weeks to review CGM data.     Patient was not able to tolerate Mounjaro  15 mg. Had discussed SGLT-2 inhibitor previously, however, due to concerns with urosepsis and recurrent UTIs, will avoid this agent at this time.     Plan  - Continue Lantus  26 units once daily   - Continue Mounjaro  12.5 mg once a week on Tuesdays  - Continue metformin  XR 500 mg AM and 1000 mg PM    Follow-up  The patient will continue to follow up with the pharmacist. Return to pharmacist in 3 weeks via telephone. The return visit was scheduled during today's visit.  Subjective & Objective      Hasn't been wearing CGM, plans on putting it on sometime this week. Still has a lot of sensors left at home - [place] 3 plus   Has been checking via fingerstick -   Lowest BG 99 mg/dL   Highest BG 869 mg/dL   Was in the hospital last month for GI concerns, no changes to diabetes  He has been checking blood pressures at home because one of his BP meds was held.     Diabetes    HPI  Current diabetes medication regimen:   - metformin  XR 500 mg in the morning and 1000 mg in the evening   - Mounjaro  12.5 mg once a week on Wednesday  - insulin  glargine 26 units once daily in the morning at 9 am  Previous medications for diabetes:   - Mounjaro  15 mg - unable to tolerate   - Trulicity  - switched to Mounjaro    - metformin  IR - diarrhea   - Januvia - switched to Trulicity      Blood Glucose  Monitoring times: fasting  The baseline HbA1C was 9.4 on 07/07/2020.    Diet and Exercise  Number of meals per day: 2  Breakfast: doesn't usually eat breakfast  Lunch: chicken salad  Dinner: mac and cheese, chicken salad, veggies, usually fairly carb heavy   Snacks: sometimes will snack throughout the day - popcorn, cookies, rice crispy, potato chips   Drinks: [place]  lite half and half tea - 12 oz can, sometimes will hvae 2-3 /day  Last meal of the day is usually 5 pm     Comorbidities  ASCVD condition(s): none  CKD: no  Heart failure: no    Obesity: yes    Health Maintenance  Aspirin  utilization: Patient is not taking aspirin  due to the following reason: not indicated.  Statin utilization: Patient is taking a statin.  Hypertension: Patient has hypertension, which is not controlled.    Primary Care - Labs   Basic Metabolic Profile    Lab Results   Component Value Date/Time    NA 137 01/03/2024 04:45 AM    K 3.9 01/03/2024 04:45 AM    CA 9.0 01/03/2024 04:45 AM    CL 101 01/03/2024 04:45  AM    CO2 28 01/03/2024 04:45 AM    GAP 8 01/03/2024 04:45 AM    EGFR1 >60 09/01/2022 03:14 PM    Lab Results   Component Value Date/Time    BUN 8 01/03/2024 04:45 AM    CR 0.71 01/03/2024 04:45 AM    GLU 130 (H) 01/03/2024 04:45 AM         Lab Draw:  Lab Results   Component Value Date/Time    HGBA1C 7.7 (H) 12/14/2023 01:59 PM    HGBA1C 7.8 (H) 06/02/2023 03:02 PM    HGBA1C 8.8 (H) 09/01/2022 03:14 PM     POC:  Lab Results   Component Value Date/Time    A1C 7.8 (A) 12/02/2022 12:00 AM    A1C 8.3 (A) 04/27/2022 12:00 AM        No results found for: MCALB24   Microalbumin/CR ratio Urine   Date Value Ref Range Status   12/02/2022 92.5 (H) <30.0 ?g/mg Final     Urine Microalbumin   Date Value Ref Range Status   12/02/2022 84.2 ?g/mL Final        Lab Results   Component Value Date    CHOL 103 12/14/2023    TRIG 159 (H) 12/14/2023    HDL 38 (L) 12/14/2023    LDL 62.94 12/14/2023    VLDL 31.8 12/14/2023 NONHDLCHOL 65 12/14/2023    CHOLHDLC 6.9 (H) 10/29/2020         BP Readings from Last 1 Encounters:   01/03/24 112/53      Medication History  Medication history was not completed because previously completed (follow-up visit).    Adverse Drug Reactions  Adverse drug reactions were reviewed with the patient.    Significant adverse drug reaction(s) were not identified.    Side effect(s) were not reported.    The patient is filling their medications through 2 pharmacies due to the following reason(s): patient preference. Patient is currently using [facility] pharmacy. Patient is currently using [facility] based on patient preference.  Home Medications   Medication Sig   acetaminophen  (TYLENOL  EXTRA STRENGTH) 500 mg tablet Take two tablets by mouth every 6 hours as needed for Pain. Max of 4,000 mg of acetaminophen  in 24 hours.  Indications: pain   blood sugar diagnostic (ONETOUCH ULTRA TEST) test strip Use one strip as directed daily before breakfast. Diagnosis Code: E11.65 onetouch ultra 50 test strips   blood-glucose sensor (FREESTYLE LIBRE 3 PLUS SENSOR) sensor device Use to check blood sugars continuously. Replace every 15 days.   carvediloL  (COREG ) 6.25 mg tablet take 2 tablets BY MOUTH TWICE DAILY WITH MEALS   cholecalciferol (+) (VITAMIN D3) 2,000 unit tablet Take one tablet by mouth daily.   cyanocobalamin  (vitamin B-12) 3,000 mcg cap Take one capsule by mouth daily.   ezetimibe  (ZETIA ) 10 mg tablet TAKE 1 TABLET BY MOUTH once daily   insulin  glargine-yfgn (SEMGLEE  U-100) 100 unit/mL (3 mL) injectable PEN Inject twenty six Units under the skin every morning. Indications: type 2 diabetes mellitus   insulin  pen needles (disposable) (COMFORT EZ PEN NEEDLES) 31 gauge x 5/16 pen needle Use to inject insulin  1x/day   lancets 33 gauge 33 gauge Use one each as directed daily. Diag: e11.65 onetouch delica plus lancets 33 gauge  Indications: type 2 diabetes mellitus   Leg Brace misc Bilateral Fitted leg brace and right shoe for foot drop.  Dx: spina bifida.   levETIRAcetam  (KEPPRA ) 500 mg tablet Take one tablet  by mouth twice daily.   loperamide  (IMODIUM  A-D) 2 mg capsule Take one capsule by mouth as Needed for Diarrhea.   [Paused] losartan  (COZAAR ) 50 mg tablet Take 1 tablet by mouth daily  Wait to take this until your doctor or other care provider tells you to start again.   metFORMIN -XR (GLUCOPHAGE  XR) 500 mg extended release tablet TAKE 1 TABLET BY MOUTH IN THE MORNING AND 2 tablets IN THE EVENING with meals   multivit-min/folic/vit K/lycop (ONE-A-DAY MEN'S MULTIVITAMIN PO) Take 1 tablet by mouth daily.   psyllium seed (with sugar) (FIBER PO) Take 1 tablet by mouth twice daily.   rosuvastatin  (CRESTOR ) 40 mg tablet TAKE 1 TABLET BY MOUTH at bedtime   spironolactone  (ALDACTONE ) 25 mg tablet take 2 tablets BY MOUTH ONCE daily WITH FOOD   tirzepatide  (MOUNJARO ) 12.5 mg/0.5 mL injector PEN Inject 0.5 mL under the skin every 7 days.   VASCEPA  1 gram capsule TAKE TWO CAPSULES BY MOUTH TWICE DAILY WITH MEALS      Education  Education provided: not necessary    Time spent with patient: 10 minutes  Nathanel Miyamoto, PHARMD

## 2024-01-25 ENCOUNTER — Encounter: Admit: 2024-01-25 | Discharge: 2024-01-25 | Payer: MEDICARE

## 2024-02-01 ENCOUNTER — Encounter: Admit: 2024-02-01 | Discharge: 2024-02-01 | Payer: MEDICARE

## 2024-02-06 ENCOUNTER — Encounter: Admit: 2024-02-06 | Discharge: 2024-02-06 | Payer: MEDICARE

## 2024-02-07 ENCOUNTER — Encounter: Admit: 2024-02-07 | Discharge: 2024-02-07 | Payer: MEDICARE

## 2024-02-08 ENCOUNTER — Encounter: Admit: 2024-02-08 | Discharge: 2024-02-08 | Payer: MEDICARE

## 2024-02-08 MED FILL — MOUNJARO 12.5 MG/0.5 ML SC PNIJ: 12.5 | SUBCUTANEOUS | 28 days supply | Qty: 2 | Fill #4 | Status: AC

## 2024-02-13 ENCOUNTER — Ambulatory Visit: Admit: 2024-02-13 | Discharge: 2024-02-14 | Payer: MEDICARE

## 2024-02-15 ENCOUNTER — Encounter: Admit: 2024-02-15 | Discharge: 2024-02-15 | Payer: MEDICARE

## 2024-02-15 MED ORDER — CARVEDILOL 6.25 MG PO TAB
12.5 mg | ORAL_TABLET | Freq: Two times a day (BID) | ORAL | 3 refills | 90.00000 days | Status: AC
Start: 2024-02-15 — End: ?
